# Patient Record
Sex: Female | Born: 1935
Health system: Southern US, Community
[De-identification: ages and names within clinical notes are randomized; demographics above are authoritative.]

## PROBLEM LIST (undated history)

## (undated) DIAGNOSIS — R569 Unspecified convulsions: Secondary | ICD-10-CM

## (undated) DIAGNOSIS — I779 Disorder of arteries and arterioles, unspecified: Secondary | ICD-10-CM

## (undated) DIAGNOSIS — Z951 Presence of aortocoronary bypass graft: Secondary | ICD-10-CM

## (undated) DIAGNOSIS — I739 Peripheral vascular disease, unspecified: Secondary | ICD-10-CM

## (undated) DIAGNOSIS — M1990 Unspecified osteoarthritis, unspecified site: Secondary | ICD-10-CM

## (undated) DIAGNOSIS — D219 Benign neoplasm of connective and other soft tissue, unspecified: Secondary | ICD-10-CM

## (undated) DIAGNOSIS — I10 Essential (primary) hypertension: Secondary | ICD-10-CM

## (undated) DIAGNOSIS — N951 Menopausal and female climacteric states: Secondary | ICD-10-CM

## (undated) DIAGNOSIS — E785 Hyperlipidemia, unspecified: Secondary | ICD-10-CM

## (undated) DIAGNOSIS — G629 Polyneuropathy, unspecified: Secondary | ICD-10-CM

## (undated) DIAGNOSIS — D649 Anemia, unspecified: Secondary | ICD-10-CM

## (undated) DIAGNOSIS — I2510 Atherosclerotic heart disease of native coronary artery without angina pectoris: Secondary | ICD-10-CM

## (undated) DIAGNOSIS — J45909 Unspecified asthma, uncomplicated: Secondary | ICD-10-CM

## (undated) HISTORY — PX: FOOT SURGERY: SHX648

## (undated) HISTORY — DX: Peripheral vascular disease, unspecified: I73.9

## (undated) HISTORY — PX: OTHER SURGICAL HISTORY: SHX169

## (undated) HISTORY — DX: Presence of aortocoronary bypass graft: Z95.1

## (undated) HISTORY — DX: Polyneuropathy, unspecified: G62.9

## (undated) HISTORY — DX: Benign neoplasm of connective and other soft tissue, unspecified: D21.9

## (undated) HISTORY — DX: Essential (primary) hypertension: I10

## (undated) HISTORY — DX: Atherosclerotic heart disease of native coronary artery without angina pectoris: I25.10

## (undated) HISTORY — DX: Menopausal and female climacteric states: N95.1

## (undated) HISTORY — PX: CORONARY ARTERY BYPASS GRAFT: SHX141

## (undated) HISTORY — DX: Hyperlipidemia, unspecified: E78.5

## (undated) HISTORY — DX: Unspecified convulsions: R56.9

## (undated) HISTORY — DX: Disorder of arteries and arterioles, unspecified: I77.9

## (undated) HISTORY — PX: JOINT REPLACEMENT: SHX530

---

## 1997-12-14 HISTORY — PX: OTHER SURGICAL HISTORY: SHX169

## 1997-12-24 ENCOUNTER — Ambulatory Visit (HOSPITAL_COMMUNITY): Admission: RE | Admit: 1997-12-24 | Discharge: 1997-12-24 | Payer: Self-pay | Admitting: Obstetrics and Gynecology

## 1998-03-22 ENCOUNTER — Other Ambulatory Visit: Admission: RE | Admit: 1998-03-22 | Discharge: 1998-03-22 | Payer: Self-pay | Admitting: Obstetrics and Gynecology

## 1999-05-30 ENCOUNTER — Other Ambulatory Visit: Admission: RE | Admit: 1999-05-30 | Discharge: 1999-05-30 | Payer: Self-pay | Admitting: Obstetrics and Gynecology

## 1999-05-30 ENCOUNTER — Encounter (INDEPENDENT_AMBULATORY_CARE_PROVIDER_SITE_OTHER): Payer: Self-pay | Admitting: Specialist

## 2000-06-13 ENCOUNTER — Other Ambulatory Visit: Admission: RE | Admit: 2000-06-13 | Discharge: 2000-06-13 | Payer: Self-pay | Admitting: Obstetrics and Gynecology

## 2001-04-05 ENCOUNTER — Encounter: Payer: Self-pay | Admitting: Internal Medicine

## 2001-04-05 ENCOUNTER — Encounter: Admission: RE | Admit: 2001-04-05 | Discharge: 2001-04-05 | Payer: Self-pay | Admitting: Internal Medicine

## 2001-07-23 ENCOUNTER — Other Ambulatory Visit: Admission: RE | Admit: 2001-07-23 | Discharge: 2001-07-23 | Payer: Self-pay | Admitting: Obstetrics and Gynecology

## 2002-06-18 ENCOUNTER — Ambulatory Visit: Admission: RE | Admit: 2002-06-18 | Discharge: 2002-06-18 | Payer: Self-pay | Admitting: Internal Medicine

## 2002-09-17 ENCOUNTER — Other Ambulatory Visit: Admission: RE | Admit: 2002-09-17 | Discharge: 2002-09-17 | Payer: Self-pay | Admitting: Obstetrics and Gynecology

## 2004-12-05 ENCOUNTER — Other Ambulatory Visit: Admission: RE | Admit: 2004-12-05 | Discharge: 2004-12-05 | Payer: Self-pay | Admitting: Obstetrics and Gynecology

## 2005-07-16 DIAGNOSIS — Z951 Presence of aortocoronary bypass graft: Secondary | ICD-10-CM

## 2005-07-16 HISTORY — DX: Presence of aortocoronary bypass graft: Z95.1

## 2005-12-06 ENCOUNTER — Other Ambulatory Visit: Admission: RE | Admit: 2005-12-06 | Discharge: 2005-12-06 | Payer: Self-pay | Admitting: Obstetrics and Gynecology

## 2006-05-16 HISTORY — PX: OTHER SURGICAL HISTORY: SHX169

## 2006-07-04 ENCOUNTER — Encounter (HOSPITAL_COMMUNITY): Admission: RE | Admit: 2006-07-04 | Discharge: 2006-10-02 | Payer: Self-pay | Admitting: Cardiology

## 2006-10-03 ENCOUNTER — Encounter (HOSPITAL_COMMUNITY): Admission: RE | Admit: 2006-10-03 | Discharge: 2006-10-19 | Payer: Self-pay | Admitting: Cardiovascular Disease

## 2006-10-29 ENCOUNTER — Encounter (HOSPITAL_COMMUNITY): Admission: RE | Admit: 2006-10-29 | Discharge: 2007-01-27 | Payer: Self-pay | Admitting: Cardiovascular Disease

## 2006-12-23 ENCOUNTER — Other Ambulatory Visit: Admission: RE | Admit: 2006-12-23 | Discharge: 2006-12-23 | Payer: Self-pay | Admitting: Obstetrics and Gynecology

## 2007-02-14 ENCOUNTER — Encounter (HOSPITAL_COMMUNITY): Admission: RE | Admit: 2007-02-14 | Discharge: 2007-05-15 | Payer: Self-pay | Admitting: Cardiovascular Disease

## 2007-05-17 ENCOUNTER — Encounter (HOSPITAL_COMMUNITY): Admission: RE | Admit: 2007-05-17 | Discharge: 2007-07-15 | Payer: Self-pay | Admitting: Cardiovascular Disease

## 2007-07-17 ENCOUNTER — Encounter (HOSPITAL_COMMUNITY): Admission: RE | Admit: 2007-07-17 | Discharge: 2007-10-15 | Payer: Self-pay | Admitting: Cardiovascular Disease

## 2007-10-16 ENCOUNTER — Encounter (HOSPITAL_COMMUNITY): Admission: RE | Admit: 2007-10-16 | Discharge: 2008-01-14 | Payer: Self-pay | Admitting: Cardiovascular Disease

## 2007-10-30 HISTORY — PX: US ECHOCARDIOGRAPHY: HXRAD669

## 2007-12-25 ENCOUNTER — Other Ambulatory Visit: Admission: RE | Admit: 2007-12-25 | Discharge: 2007-12-25 | Payer: Self-pay | Admitting: Obstetrics and Gynecology

## 2008-01-09 HISTORY — PX: CARDIAC CATHETERIZATION: SHX172

## 2008-01-15 ENCOUNTER — Encounter (HOSPITAL_COMMUNITY): Admission: RE | Admit: 2008-01-15 | Discharge: 2008-04-14 | Payer: Self-pay | Admitting: Cardiovascular Disease

## 2008-04-15 ENCOUNTER — Encounter (HOSPITAL_COMMUNITY): Admission: RE | Admit: 2008-04-15 | Discharge: 2008-07-12 | Payer: Self-pay | Admitting: Cardiovascular Disease

## 2008-07-16 ENCOUNTER — Encounter (HOSPITAL_COMMUNITY): Admission: RE | Admit: 2008-07-16 | Discharge: 2008-10-14 | Payer: Self-pay | Admitting: Cardiovascular Disease

## 2008-10-15 ENCOUNTER — Encounter (HOSPITAL_COMMUNITY): Admission: RE | Admit: 2008-10-15 | Discharge: 2009-01-13 | Payer: Self-pay | Admitting: Cardiovascular Disease

## 2009-01-10 ENCOUNTER — Encounter: Payer: Self-pay | Admitting: Obstetrics and Gynecology

## 2009-01-10 ENCOUNTER — Ambulatory Visit: Payer: Self-pay | Admitting: Obstetrics and Gynecology

## 2009-01-10 ENCOUNTER — Other Ambulatory Visit: Admission: RE | Admit: 2009-01-10 | Discharge: 2009-01-10 | Payer: Self-pay | Admitting: Obstetrics and Gynecology

## 2009-11-29 ENCOUNTER — Encounter: Admission: RE | Admit: 2009-11-29 | Discharge: 2009-11-29 | Payer: Self-pay | Admitting: Internal Medicine

## 2010-02-09 ENCOUNTER — Ambulatory Visit: Payer: Self-pay | Admitting: Obstetrics and Gynecology

## 2010-07-11 ENCOUNTER — Encounter: Payer: Self-pay | Admitting: Cardiovascular Disease

## 2010-07-11 ENCOUNTER — Ambulatory Visit: Payer: Self-pay

## 2010-07-11 DIAGNOSIS — I739 Peripheral vascular disease, unspecified: Secondary | ICD-10-CM | POA: Insufficient documentation

## 2010-08-17 NOTE — Miscellaneous (Signed)
Summary: Orders Update  Clinical Lists Changes  Problems: Added new problem of UNSPECIFIED PERIPHERAL VASCULAR DISEASE (ICD-443.9) Orders: Added new Test order of Arterial Duplex Lower Extremity (Arterial Duplex Low) - Signed

## 2011-01-12 HISTORY — PX: NM MYOVIEW LTD: HXRAD82

## 2011-01-18 ENCOUNTER — Encounter: Payer: Self-pay | Admitting: *Deleted

## 2011-02-12 ENCOUNTER — Encounter: Payer: Self-pay | Admitting: Obstetrics and Gynecology

## 2011-02-12 ENCOUNTER — Ambulatory Visit (INDEPENDENT_AMBULATORY_CARE_PROVIDER_SITE_OTHER): Payer: Medicare Other | Admitting: Obstetrics and Gynecology

## 2011-02-12 ENCOUNTER — Other Ambulatory Visit (HOSPITAL_COMMUNITY)
Admission: RE | Admit: 2011-02-12 | Discharge: 2011-02-12 | Disposition: A | Discharge status: Home / Self Care | Payer: Medicare Other | Source: Ambulatory Visit | Attending: Obstetrics and Gynecology | Admitting: Obstetrics and Gynecology

## 2011-02-12 VITALS — BP 130/80 | Ht 67.0 in | Wt 198.0 lb

## 2011-02-12 DIAGNOSIS — Z01419 Encounter for gynecological examination (general) (routine) without abnormal findings: Secondary | ICD-10-CM

## 2011-02-12 DIAGNOSIS — Z124 Encounter for screening for malignant neoplasm of cervix: Secondary | ICD-10-CM | POA: Insufficient documentation

## 2011-02-12 DIAGNOSIS — N951 Menopausal and female climacteric states: Secondary | ICD-10-CM

## 2011-02-12 DIAGNOSIS — R350 Frequency of micturition: Secondary | ICD-10-CM | POA: Insufficient documentation

## 2011-02-12 DIAGNOSIS — Z780 Asymptomatic menopausal state: Secondary | ICD-10-CM

## 2011-02-12 DIAGNOSIS — N952 Postmenopausal atrophic vaginitis: Secondary | ICD-10-CM

## 2011-02-12 DIAGNOSIS — D259 Leiomyoma of uterus, unspecified: Secondary | ICD-10-CM | POA: Insufficient documentation

## 2011-02-12 NOTE — Progress Notes (Signed)
The patient came back to see me today for further followup. She remains on CombiPatch but she is only using one a week rather than two aweek like she used to. This is controlling all her menopausal symptoms. Although she has vaginal dryness on exam she is not sexually active and is not aware of that. She does have nocturia but does not feel it needs treatment. She has no dysuria hematuria or urgency. She has fibroids that we have watched for many years and it continued to be a nonissue for her. She has no bleeding anymore( vaginal bleeding). She is due for a mammogram next month. She is up-to-date on bone densities.  Past medical history, family history, social history, and review of systems was completely reviewed and is unchanged. Please see above notes.  Physical examination: HEENT within normal limits. Neck: Thyroid not large. No masses. Supraclavicular nodes: not enlarged. Breasts: Examined in both sitting midline position. No skin changes and no masses. Abdomen: Soft no guarding rebound or masses or hernia. Pelvic: External: Within normal limits. BUS: Within normal limits. Vaginal:within normal limits. Good estrogen effect. No evidence of cystocele rectocele or enterocele. Cervix: clean. Uterus: top normal size and shape. Adnexa: No masses. Rectovaginal exam: Confirmatory and negative. Extremities: Within normal limits.  Assessment: 1 menopausal symptoms. 2 fibroids. 3 atrophic vaginitis. 4 nocturia.  Plan: Continue CombiPatch 0.05 mg once a week. We discussed a higher risk of breast cancer with estrogen. Mammogram next month.

## 2011-03-01 ENCOUNTER — Encounter: Payer: Self-pay | Admitting: Obstetrics and Gynecology

## 2011-03-12 ENCOUNTER — Other Ambulatory Visit: Payer: Self-pay

## 2011-03-12 ENCOUNTER — Encounter: Payer: Self-pay | Admitting: Obstetrics and Gynecology

## 2011-03-12 DIAGNOSIS — R922 Inconclusive mammogram: Secondary | ICD-10-CM

## 2011-04-30 ENCOUNTER — Other Ambulatory Visit: Payer: Self-pay | Admitting: Obstetrics and Gynecology

## 2011-05-21 ENCOUNTER — Other Ambulatory Visit: Payer: Self-pay | Admitting: Ophthalmology

## 2011-07-19 DIAGNOSIS — I10 Essential (primary) hypertension: Secondary | ICD-10-CM | POA: Diagnosis not present

## 2011-07-24 DIAGNOSIS — Z79899 Other long term (current) drug therapy: Secondary | ICD-10-CM | POA: Diagnosis not present

## 2011-07-24 DIAGNOSIS — E7800 Pure hypercholesterolemia, unspecified: Secondary | ICD-10-CM | POA: Diagnosis not present

## 2011-07-26 DIAGNOSIS — E7800 Pure hypercholesterolemia, unspecified: Secondary | ICD-10-CM | POA: Diagnosis not present

## 2011-08-23 DIAGNOSIS — I10 Essential (primary) hypertension: Secondary | ICD-10-CM | POA: Diagnosis not present

## 2011-08-27 DIAGNOSIS — I6529 Occlusion and stenosis of unspecified carotid artery: Secondary | ICD-10-CM | POA: Diagnosis not present

## 2011-08-27 HISTORY — PX: OTHER SURGICAL HISTORY: SHX169

## 2011-09-07 DIAGNOSIS — E7800 Pure hypercholesterolemia, unspecified: Secondary | ICD-10-CM | POA: Diagnosis not present

## 2011-09-18 DIAGNOSIS — I10 Essential (primary) hypertension: Secondary | ICD-10-CM | POA: Diagnosis not present

## 2011-09-18 DIAGNOSIS — E789 Disorder of lipoprotein metabolism, unspecified: Secondary | ICD-10-CM | POA: Diagnosis not present

## 2011-09-18 DIAGNOSIS — I2510 Atherosclerotic heart disease of native coronary artery without angina pectoris: Secondary | ICD-10-CM | POA: Diagnosis not present

## 2011-10-15 DIAGNOSIS — E789 Disorder of lipoprotein metabolism, unspecified: Secondary | ICD-10-CM | POA: Diagnosis not present

## 2011-10-22 DIAGNOSIS — E789 Disorder of lipoprotein metabolism, unspecified: Secondary | ICD-10-CM | POA: Diagnosis not present

## 2011-12-27 DIAGNOSIS — E782 Mixed hyperlipidemia: Secondary | ICD-10-CM | POA: Diagnosis not present

## 2011-12-27 DIAGNOSIS — I6529 Occlusion and stenosis of unspecified carotid artery: Secondary | ICD-10-CM | POA: Diagnosis not present

## 2011-12-27 DIAGNOSIS — I10 Essential (primary) hypertension: Secondary | ICD-10-CM | POA: Diagnosis not present

## 2011-12-27 DIAGNOSIS — I2510 Atherosclerotic heart disease of native coronary artery without angina pectoris: Secondary | ICD-10-CM | POA: Diagnosis not present

## 2012-01-16 DIAGNOSIS — E789 Disorder of lipoprotein metabolism, unspecified: Secondary | ICD-10-CM | POA: Diagnosis not present

## 2012-01-21 DIAGNOSIS — E789 Disorder of lipoprotein metabolism, unspecified: Secondary | ICD-10-CM | POA: Diagnosis not present

## 2012-01-21 DIAGNOSIS — I2510 Atherosclerotic heart disease of native coronary artery without angina pectoris: Secondary | ICD-10-CM | POA: Diagnosis not present

## 2012-01-21 DIAGNOSIS — I10 Essential (primary) hypertension: Secondary | ICD-10-CM | POA: Diagnosis not present

## 2012-01-23 DIAGNOSIS — B351 Tinea unguium: Secondary | ICD-10-CM | POA: Diagnosis not present

## 2012-01-23 DIAGNOSIS — M79609 Pain in unspecified limb: Secondary | ICD-10-CM | POA: Diagnosis not present

## 2012-02-15 ENCOUNTER — Ambulatory Visit (INDEPENDENT_AMBULATORY_CARE_PROVIDER_SITE_OTHER): Payer: Medicare Other | Admitting: Obstetrics and Gynecology

## 2012-02-15 ENCOUNTER — Encounter: Payer: Self-pay | Admitting: Obstetrics and Gynecology

## 2012-02-15 VITALS — BP 120/76 | Ht 67.0 in | Wt 202.0 lb

## 2012-02-15 DIAGNOSIS — R351 Nocturia: Secondary | ICD-10-CM | POA: Diagnosis not present

## 2012-02-15 DIAGNOSIS — E785 Hyperlipidemia, unspecified: Secondary | ICD-10-CM | POA: Insufficient documentation

## 2012-02-15 DIAGNOSIS — D219 Benign neoplasm of connective and other soft tissue, unspecified: Secondary | ICD-10-CM

## 2012-02-15 DIAGNOSIS — E7800 Pure hypercholesterolemia, unspecified: Secondary | ICD-10-CM | POA: Insufficient documentation

## 2012-02-15 DIAGNOSIS — Z780 Asymptomatic menopausal state: Secondary | ICD-10-CM | POA: Diagnosis not present

## 2012-02-15 DIAGNOSIS — D259 Leiomyoma of uterus, unspecified: Secondary | ICD-10-CM | POA: Diagnosis not present

## 2012-02-15 MED ORDER — ESTRADIOL-NORETHINDRONE ACET 0.05-0.14 MG/DAY TD PTTW: 1.0000 | MEDICATED_PATCH | TRANSDERMAL | Status: DC

## 2012-02-15 NOTE — Progress Notes (Signed)
Patient came to see me today for further followup. She takes CombiPatch for menopausal symptoms with excellent results. Initially she would do it twice a week as it is usually prescribed and she only needs it once a week with no problems. She is having no vaginal bleeding. She is having no pelvic pain. She is not sexually active. She has fibroids and we have watched them for 32 years and she is not symptomatic enough to require surgery. She is due for her yearly mammogram. She has bone densities through  her PCP. She gets skin exams because of multiple moles from her dermatologist. She has nocturia x3 without urgency or incontinence. She attributes it to her medication which she takes at bedtime. She's never had an abnormal Pap smear. Her last Pap smear was last year.  ROS: 12 systems reviewed done. Pertinent positives above. Other positives include peripheral vascular disease, and hyperlipidemia.  Physical examination:Kim Alcario Drought present. HEENT within normal limits. Neck: Thyroid not large. No masses. Supraclavicular nodes: not enlarged. Breasts: Examined in both sitting and lying  position. No skin changes and no masses. Abdomen: Soft no guarding rebound or masses or hernia. Pelvic: External: Within normal limits. BUS: Within normal limits. Vaginal:within normal limits. Good estrogen effect. No evidence of cystocele rectocele or enterocele. Cervix: clean. Uterus:enlarged with small fibroids to 7-8 week size. Adnexa: No masses. Rectovaginal exam: Confirmatory and negative.  Assessment: #1. Menopausal symptoms #2. Fibroids #3. Nocturia  Plan: Discussed increased risk of breast cancer with patch. Patient will continue CombiPatch one weekly. Mammogram. Patient to go back and get skin exam. She will discuss nocturia with PCP relative to her medication. She knows to call me if she would like me to treat.The new Pap smear guidelines were discussed with the patient. No pap done. Extremities: Within normal  limits.

## 2012-02-15 NOTE — Patient Instructions (Addendum)
Please schedule mammogram

## 2012-02-16 LAB — URINALYSIS W MICROSCOPIC + REFLEX CULTURE
Bacteria, UA: NONE SEEN
Leukocytes, UA: NEGATIVE
Nitrite: NEGATIVE
Specific Gravity, Urine: 1.027 (ref 1.005–1.030)
Urobilinogen, UA: 0.2 mg/dL (ref 0.0–1.0)
pH: 5 (ref 5.0–8.0)

## 2012-02-18 DIAGNOSIS — E789 Disorder of lipoprotein metabolism, unspecified: Secondary | ICD-10-CM | POA: Diagnosis not present

## 2012-02-18 DIAGNOSIS — Z0000 Encounter for general adult medical examination without abnormal findings: Secondary | ICD-10-CM | POA: Diagnosis not present

## 2012-02-18 DIAGNOSIS — E559 Vitamin D deficiency, unspecified: Secondary | ICD-10-CM | POA: Diagnosis not present

## 2012-02-21 ENCOUNTER — Other Ambulatory Visit: Payer: Self-pay | Admitting: Obstetrics and Gynecology

## 2012-02-21 DIAGNOSIS — R252 Cramp and spasm: Secondary | ICD-10-CM | POA: Diagnosis not present

## 2012-02-21 DIAGNOSIS — I2510 Atherosclerotic heart disease of native coronary artery without angina pectoris: Secondary | ICD-10-CM | POA: Diagnosis not present

## 2012-02-21 DIAGNOSIS — Z8614 Personal history of Methicillin resistant Staphylococcus aureus infection: Secondary | ICD-10-CM | POA: Diagnosis not present

## 2012-02-21 DIAGNOSIS — N390 Urinary tract infection, site not specified: Secondary | ICD-10-CM

## 2012-02-21 DIAGNOSIS — E7800 Pure hypercholesterolemia, unspecified: Secondary | ICD-10-CM | POA: Diagnosis not present

## 2012-02-21 MED ORDER — CIPROFLOXACIN HCL 250 MG PO TABS: 250.0000 mg | ORAL_TABLET | Freq: Two times a day (BID) | ORAL | Status: AC

## 2012-03-14 DIAGNOSIS — I10 Essential (primary) hypertension: Secondary | ICD-10-CM | POA: Diagnosis not present

## 2012-03-14 DIAGNOSIS — E789 Disorder of lipoprotein metabolism, unspecified: Secondary | ICD-10-CM | POA: Diagnosis not present

## 2012-04-03 DIAGNOSIS — E669 Obesity, unspecified: Secondary | ICD-10-CM | POA: Diagnosis not present

## 2012-04-03 DIAGNOSIS — R252 Cramp and spasm: Secondary | ICD-10-CM | POA: Diagnosis not present

## 2012-05-01 DIAGNOSIS — M545 Low back pain: Secondary | ICD-10-CM | POA: Diagnosis not present

## 2012-05-09 DIAGNOSIS — M545 Low back pain: Secondary | ICD-10-CM | POA: Diagnosis not present

## 2012-05-12 DIAGNOSIS — M545 Low back pain: Secondary | ICD-10-CM | POA: Diagnosis not present

## 2012-05-14 DIAGNOSIS — M545 Low back pain: Secondary | ICD-10-CM | POA: Diagnosis not present

## 2012-05-19 DIAGNOSIS — M545 Low back pain: Secondary | ICD-10-CM | POA: Diagnosis not present

## 2012-05-21 DIAGNOSIS — M545 Low back pain: Secondary | ICD-10-CM | POA: Diagnosis not present

## 2012-05-26 DIAGNOSIS — M545 Low back pain: Secondary | ICD-10-CM | POA: Diagnosis not present

## 2012-05-27 DIAGNOSIS — H52209 Unspecified astigmatism, unspecified eye: Secondary | ICD-10-CM | POA: Diagnosis not present

## 2012-05-27 DIAGNOSIS — H35379 Puckering of macula, unspecified eye: Secondary | ICD-10-CM | POA: Diagnosis not present

## 2012-05-27 DIAGNOSIS — Z961 Presence of intraocular lens: Secondary | ICD-10-CM | POA: Diagnosis not present

## 2012-05-28 DIAGNOSIS — M545 Low back pain: Secondary | ICD-10-CM | POA: Diagnosis not present

## 2012-06-02 DIAGNOSIS — M545 Low back pain: Secondary | ICD-10-CM | POA: Diagnosis not present

## 2012-06-04 DIAGNOSIS — M545 Low back pain: Secondary | ICD-10-CM | POA: Diagnosis not present

## 2012-06-16 DIAGNOSIS — M545 Low back pain: Secondary | ICD-10-CM | POA: Diagnosis not present

## 2012-06-18 DIAGNOSIS — M545 Low back pain: Secondary | ICD-10-CM | POA: Diagnosis not present

## 2012-06-30 DIAGNOSIS — M545 Low back pain: Secondary | ICD-10-CM | POA: Diagnosis not present

## 2012-07-10 DIAGNOSIS — I10 Essential (primary) hypertension: Secondary | ICD-10-CM | POA: Diagnosis not present

## 2012-07-10 DIAGNOSIS — G4762 Sleep related leg cramps: Secondary | ICD-10-CM | POA: Diagnosis not present

## 2012-07-11 DIAGNOSIS — I10 Essential (primary) hypertension: Secondary | ICD-10-CM | POA: Diagnosis not present

## 2012-07-11 DIAGNOSIS — M545 Low back pain: Secondary | ICD-10-CM | POA: Diagnosis not present

## 2012-07-11 DIAGNOSIS — E782 Mixed hyperlipidemia: Secondary | ICD-10-CM | POA: Diagnosis not present

## 2012-07-11 DIAGNOSIS — I2510 Atherosclerotic heart disease of native coronary artery without angina pectoris: Secondary | ICD-10-CM | POA: Diagnosis not present

## 2012-07-18 DIAGNOSIS — I10 Essential (primary) hypertension: Secondary | ICD-10-CM | POA: Diagnosis not present

## 2012-07-18 DIAGNOSIS — E789 Disorder of lipoprotein metabolism, unspecified: Secondary | ICD-10-CM | POA: Diagnosis not present

## 2012-07-18 DIAGNOSIS — M545 Low back pain: Secondary | ICD-10-CM | POA: Diagnosis not present

## 2012-07-22 DIAGNOSIS — R252 Cramp and spasm: Secondary | ICD-10-CM | POA: Diagnosis not present

## 2012-07-22 DIAGNOSIS — E7800 Pure hypercholesterolemia, unspecified: Secondary | ICD-10-CM | POA: Diagnosis not present

## 2012-12-01 ENCOUNTER — Other Ambulatory Visit: Payer: Self-pay | Admitting: Cardiovascular Disease

## 2012-12-02 NOTE — Telephone Encounter (Signed)
Rx was sent to pharmacy electronically. 

## 2013-01-03 ENCOUNTER — Encounter: Payer: Self-pay | Admitting: *Deleted

## 2013-01-05 ENCOUNTER — Encounter: Payer: Self-pay | Admitting: Cardiovascular Disease

## 2013-01-09 ENCOUNTER — Ambulatory Visit: Payer: Medicare Other | Admitting: Physician Assistant

## 2013-01-13 ENCOUNTER — Ambulatory Visit (INDEPENDENT_AMBULATORY_CARE_PROVIDER_SITE_OTHER): Payer: Medicare Other | Admitting: Physician Assistant

## 2013-01-13 ENCOUNTER — Encounter: Payer: Self-pay | Admitting: Physician Assistant

## 2013-01-13 VITALS — BP 144/74 | HR 75 | Ht 66.5 in | Wt 206.2 lb

## 2013-01-13 DIAGNOSIS — I2510 Atherosclerotic heart disease of native coronary artery without angina pectoris: Secondary | ICD-10-CM | POA: Insufficient documentation

## 2013-01-13 DIAGNOSIS — I10 Essential (primary) hypertension: Secondary | ICD-10-CM | POA: Insufficient documentation

## 2013-01-13 DIAGNOSIS — E7800 Pure hypercholesterolemia, unspecified: Secondary | ICD-10-CM

## 2013-01-13 DIAGNOSIS — I739 Peripheral vascular disease, unspecified: Secondary | ICD-10-CM | POA: Diagnosis not present

## 2013-01-13 DIAGNOSIS — Z951 Presence of aortocoronary bypass graft: Secondary | ICD-10-CM | POA: Diagnosis not present

## 2013-01-13 NOTE — Assessment & Plan Note (Signed)
Well compensated with no complaints of angina.

## 2013-01-13 NOTE — Assessment & Plan Note (Addendum)
Most recent carotid Dopplers were February 2013 demonstrated moderate amount of fibrous plaque in the right internal carotid artery consistent with equal to or less than 50% diameter reduction. The left ICA demonstrated moderate amount of plaque with no evidence of significant diameter reduction.  Patient does have a decrease of 18 mmHg in blood pressure in the right arm compared to the left.  We'll arrange upper extremity arterial Dopplers.

## 2013-01-13 NOTE — Assessment & Plan Note (Signed)
Blood pressure is elevated today however, patient has not taken her morning medications she just traveled from [place] after attending a funeral.

## 2013-01-13 NOTE — Progress Notes (Signed)
Date:  01/13/2013   ID:  Tara Potts, DOB 1934-01-21, MRN [id number]  PCP:  Horatio Pel, MD  Primary Cardiologist:  Gwenlyn Found    History of Present Illness: Tara Potts is a 79 y.o. female the history of coronary artery disease and coronary artery bypass grafting November 2007 at [facility]. She also has peripheral arterial disease, hypertension, hyperlipidemia. Last carotid Dopplers were February 2013 showed moderate right and left ICA stenosis. Last Myoview stress test was 01/12/2011 was nonischemic. Her last cardiac catheterization was 01/09/2008 revealed diffusely diseased graft to the PDA as well as diffuse PDA disease. She's been treated medically.  Patient presents today for her six-month followup. She does complain of some intermittent paresthesia in the right hand. She thought was positional but somehow she was sleeping. Also continues to complain of significant intermittent cramping feelings in her feet and lower legs without ambulation. Other than that she feels quite well and denies nausea, vomiting, fever, chest pain, shortness of breath, orthopnea, dizziness, PND, cough, congestion, abdominal pain, hematochezia, melena, lower extremity edema, claudication.  Wt Readings from Last 3 Encounters:  01/13/13 206 lb 3.2 oz (93.532 kg)  02/15/12 202 lb (91.627 kg)  02/12/11 198 lb (89.812 kg)     Past Medical History  Diagnosis Date  . Fibroid   . Menopausal symptoms   . CAD (coronary artery disease)   . Hyperlipemia   . Hypertension   . Carotid disease, bilateral     mild  . Hx of CABG 2007    [facility]    Current Outpatient Prescriptions  Medication Sig Dispense Refill  . Ascorbic Acid (VITAMIN C PO) Take by mouth.        [name] aspirin 325 MG tablet Take 325 mg by mouth daily.        [name] CALCIUM PO Take by mouth.        . co-enzyme Q-10 30 MG capsule Take 30 mg by mouth 3 (three) times daily.      . cyclobenzaprine (FLEXERIL) 10 MG tablet Take 10 mg by  mouth at bedtime as needed.      [name] estradiol-norethindrone (COMBIPATCH) 0.05-0.14 MG/DAY Place 1 patch onto the skin 2 (two) times a week.  24 patch  3  . fish oil-omega-3 fatty acids 1000 MG capsule Take 2 g by mouth daily.        [name] glucosamine-chondroitin 500-400 MG tablet Take 2 tablets by mouth 2 (two) times daily.      [name] losartan-hydrochlorothiazide (HYZAAR) 100-25 MG per tablet Take 1 tablet by mouth daily. Take 1/2 tablet daily      . metoprolol (LOPRESSOR) 50 MG tablet TAKE 1 TABLET BY MOUTH TWICE DAILY  60 tablet  6  . Multiple Vitamin (MULTIVITAMIN) tablet Take 1 tablet by mouth daily.        . nitroGLYCERIN (NITROLINGUAL) 0.4 MG/SPRAY spray Place 1 spray under the tongue every 5 (five) minutes as needed for chest pain.      . rosuvastatin (CRESTOR) 40 MG tablet Take 20 mg by mouth daily.        No current facility-administered medications for this visit.    Allergies:   No Known Allergies  Social History:  The patient  reports that she has quit smoking. She has never used smokeless tobacco. She reports that she does not drink alcohol or use illicit drugs.   Family history:   Family History  Problem Relation Age of Onset  . Diabetes Mother   .  Hypertension Mother   . Heart disease Mother   . Heart failure Mother   . Hypertension Sister   . Breast cancer Maternal Aunt     Age 29  . Heart failure Maternal Aunt   . Heart failure Maternal Uncle     ROS:  Please see the history of present illness.  All other systems reviewed and negative.   PHYSICAL EXAM: VS:  BP 144/74  Pulse 75  Ht 5' 6.5" (1.689 m)  Wt 206 lb 3.2 oz (93.532 kg)  BMI 32.79 kg/m2  LMP 07/16/2002     Blood pressure right arm 142/76, blood pressure left arm 160/78 Well nourished, well developed, in no acute distress HEENT: Pupils are equal round react to light accommodation extraocular movements are intact.  Neck: no JVDNo cervical lymphadenopathy. Cardiac: Regular rate and rhythm without murmurs rubs  or gallops. Lungs:  clear to auscultation bilaterally, no wheezing, rhonchi or rales Abd: soft, nontender, positive bowel sounds all quadrants, no hepatosplenomegaly Ext: no lower extremity edema.  2+ radial and dorsalis pedis pulses. Skin: warm and dry Neuro:  Grossly normal  EKG:  Biphasic P wave in V1 and 2 indicating interval enlargement. Otherwise sinus rhythm with premature supraventricular complexes. Rate 74 beats per minute  ASSESSMENT AND PLAN:  Problem List Items Addressed This Visit   Status post coronary artery bypass grafting: 2007   Peripheral vascular disease:  Moderate right and mild left ICA stenosis.     Most recent carotid Dopplers were February 2013 demonstrated moderate amount of fibrous plaque in the right internal carotid artery consistent with equal to or less than 50% diameter reduction. The left ICA demonstrated moderate amount of plaque with no evidence of significant diameter reduction.  Patient does have a decrease of 18 mmHg in blood pressure in the right arm compared to the left.  We'll arrange upper extremity arterial Dopplers.    Relevant Orders      Upper Extremity Arterial Duplex   Essential hypertension     Blood pressure is elevated today however, patient has not taken her morning medications she just traveled from [place] after attending a funeral.    Elevated cholesterol   Coronary artery disease     Well compensated with no complaints of angina.     Other Visit Diagnoses   CAD (coronary artery disease)    -  Primary    Relevant Orders       EKG 12-Lead

## 2013-01-13 NOTE — Patient Instructions (Signed)
He'll be scheduled for upper extremity arterial ultrasound then followup with Dr. Alvester Chou in 6 months or sooner depending on results of the study.

## 2013-01-20 DIAGNOSIS — E789 Disorder of lipoprotein metabolism, unspecified: Secondary | ICD-10-CM | POA: Diagnosis not present

## 2013-01-22 ENCOUNTER — Ambulatory Visit (HOSPITAL_COMMUNITY)
Admission: RE | Admit: 2013-01-22 | Discharge: 2013-01-22 | Disposition: A | Discharge status: Home / Self Care | Payer: Medicare Other | Source: Ambulatory Visit | Attending: Cardiovascular Disease | Admitting: Cardiovascular Disease

## 2013-01-22 DIAGNOSIS — E7800 Pure hypercholesterolemia, unspecified: Secondary | ICD-10-CM | POA: Diagnosis not present

## 2013-01-22 DIAGNOSIS — I739 Peripheral vascular disease, unspecified: Secondary | ICD-10-CM | POA: Diagnosis not present

## 2013-01-22 DIAGNOSIS — I658 Occlusion and stenosis of other precerebral arteries: Secondary | ICD-10-CM | POA: Insufficient documentation

## 2013-01-22 DIAGNOSIS — E789 Disorder of lipoprotein metabolism, unspecified: Secondary | ICD-10-CM | POA: Diagnosis not present

## 2013-01-22 DIAGNOSIS — I6529 Occlusion and stenosis of unspecified carotid artery: Secondary | ICD-10-CM | POA: Diagnosis not present

## 2013-01-22 DIAGNOSIS — Z006 Encounter for examination for normal comparison and control in clinical research program: Secondary | ICD-10-CM | POA: Diagnosis not present

## 2013-01-22 DIAGNOSIS — I10 Essential (primary) hypertension: Secondary | ICD-10-CM | POA: Diagnosis not present

## 2013-01-22 NOTE — Progress Notes (Signed)
Upper Ext. Arterial Duplex Completed.  No evidence of hemodynamically significant stenosis. Bilateral BP's  Were essentially the same. Oda Cogan, RDMS, RVT

## 2013-01-28 ENCOUNTER — Encounter: Payer: Self-pay | Admitting: *Deleted

## 2013-02-19 DIAGNOSIS — Z1231 Encounter for screening mammogram for malignant neoplasm of breast: Secondary | ICD-10-CM | POA: Diagnosis not present

## 2013-02-23 ENCOUNTER — Encounter: Payer: Self-pay | Admitting: Women's Health

## 2013-02-24 ENCOUNTER — Other Ambulatory Visit: Payer: Self-pay | Admitting: Obstetrics and Gynecology

## 2013-02-24 NOTE — Telephone Encounter (Signed)
Yearly exam scheduled for August with Dr. Loetta Rough.

## 2013-02-26 DIAGNOSIS — Z0000 Encounter for general adult medical examination without abnormal findings: Secondary | ICD-10-CM | POA: Diagnosis not present

## 2013-02-26 DIAGNOSIS — I10 Essential (primary) hypertension: Secondary | ICD-10-CM | POA: Diagnosis not present

## 2013-02-26 DIAGNOSIS — N390 Urinary tract infection, site not specified: Secondary | ICD-10-CM | POA: Diagnosis not present

## 2013-02-26 DIAGNOSIS — E7800 Pure hypercholesterolemia, unspecified: Secondary | ICD-10-CM | POA: Diagnosis not present

## 2013-03-02 ENCOUNTER — Encounter: Payer: Self-pay | Admitting: Gynecology

## 2013-03-02 ENCOUNTER — Ambulatory Visit (INDEPENDENT_AMBULATORY_CARE_PROVIDER_SITE_OTHER): Payer: Medicare Other | Admitting: Gynecology

## 2013-03-02 ENCOUNTER — Encounter: Payer: Self-pay | Admitting: Obstetrics and Gynecology

## 2013-03-02 VITALS — BP 124/78 | Ht 66.0 in | Wt 194.0 lb

## 2013-03-02 DIAGNOSIS — N952 Postmenopausal atrophic vaginitis: Secondary | ICD-10-CM | POA: Diagnosis not present

## 2013-03-02 DIAGNOSIS — Z79890 Hormone replacement therapy (postmenopausal): Secondary | ICD-10-CM | POA: Diagnosis not present

## 2013-03-02 DIAGNOSIS — N951 Menopausal and female climacteric states: Secondary | ICD-10-CM

## 2013-03-02 MED ORDER — ESTRADIOL-NORETHINDRONE ACET 0.05-0.14 MG/DAY TD PTTW: MEDICATED_PATCH | TRANSDERMAL | Status: DC

## 2013-03-02 NOTE — Patient Instructions (Addendum)
Schedule colonoscopy with [facility] at [phone] or [facility] at [phone] Schedule Bone density through Dr [name] office

## 2013-03-02 NOTE — Progress Notes (Signed)
Tara Potts [birth year] [id number]        79 y.o.  G1P1001 for followup exam.  Former patient of Dr. Cherylann Potts. Several issues noted below.  Past medical history,surgical history, medications, allergies, family history and social history were all reviewed and documented in the EPIC chart.  ROS:  Performed and pertinent positives and negatives are included in the history, assessment and plan .  Exam: Kim assistant Filed Vitals:   03/02/13 0951  BP: 124/78  Height: 5\' 6"  (1.676 m)  Weight: 194 lb (87.998 kg)   General appearance  Normal Skin chest multiple seborrheic keratoses Head/Neck normal with no cervical or supraclavicular adenopathy thyroid normal Lungs  clear Cardiac RR, without RMG Abdominal  soft, nontender, without masses, organomegaly or hernia Breasts  examined lying and sitting without masses, retractions, discharge or axillary adenopathy. Pelvic  Ext/BUS/vagina  normal with atrophic changes  Cervix  normal with atrophic changes  Uterus  anteverted, normal size, shape and contour, midline and mobile nontender   Adnexa  Without masses or tenderness    Anus and perineum  normal   Rectovaginal  normal sphincter tone without palpated masses or tenderness.    Assessment/Plan:  79 y.o. G1P1001 female for followup exam.   1. HRT. Patient is on CombiPatch 0.05/0.14. Instead of twice weekly she uses it once weekly. Whenever she tries to stop she has recurrent hot flashes, night sweats and just doesn't feel well overall.  I reviewed the whole issue of HRT with her to include the WHI study with increased risk of stroke, heart attack, DVT and breast cancer. The ACOG and NAMS statements for lowest dose for the shortest period of time reviewed. Transdermal versus oral first-pass effect benefit discussed. The unusual circumstance of using HRT in a late 23s-year-old woman particularly with a cardiac history was also reviewed. After a lengthy discussion the patient wants to continue at  present clearly understanding the risks but will try to wean this coming winter. I refilled her x1 year.  2. Multiple benign-appearing seborrheic keratoses. Patient actively see's a dermatologist on a routine basis for surveillance.  3. Pap smear 2012. No Pap smear done today. No history of abnormal Pap smears previously. Reviewed current screening guidelines. Options to stop screening altogether versus less frequent screening intervals reviewed. We'll readdress on an annual basis. 4. Mammography 02/2013. We'll continue with annual mammography. SBE monthly reviewed. 5. DEXA reported several years ago through Dr. [name] office. Recommend she followup with him as to when he recommends repeat bone density. 6. Colonoscopy never. I strongly urged her to consider screening colonoscopy. Benefits of early detection reviewed. Patient declines. 7. Health maintenance. No blood work done as this is all done through Dr. [name] office. Followup one year, sooner as needed.   Note: This document was prepared with digital dictation and possible smart phrase technology. Any transcriptional errors that result from this process are unintentional.   Tara Auerbach MD, 10:28 AM 03/02/2013

## 2013-03-04 DIAGNOSIS — I10 Essential (primary) hypertension: Secondary | ICD-10-CM | POA: Diagnosis not present

## 2013-03-04 DIAGNOSIS — I2510 Atherosclerotic heart disease of native coronary artery without angina pectoris: Secondary | ICD-10-CM | POA: Diagnosis not present

## 2013-03-04 DIAGNOSIS — E789 Disorder of lipoprotein metabolism, unspecified: Secondary | ICD-10-CM | POA: Diagnosis not present

## 2013-03-11 ENCOUNTER — Encounter: Payer: Self-pay | Admitting: Obstetrics and Gynecology

## 2013-03-23 DIAGNOSIS — R252 Cramp and spasm: Secondary | ICD-10-CM | POA: Diagnosis not present

## 2013-03-23 DIAGNOSIS — E789 Disorder of lipoprotein metabolism, unspecified: Secondary | ICD-10-CM | POA: Diagnosis not present

## 2013-03-23 DIAGNOSIS — Z780 Asymptomatic menopausal state: Secondary | ICD-10-CM | POA: Diagnosis not present

## 2013-03-23 DIAGNOSIS — E7800 Pure hypercholesterolemia, unspecified: Secondary | ICD-10-CM | POA: Diagnosis not present

## 2013-03-23 DIAGNOSIS — I2510 Atherosclerotic heart disease of native coronary artery without angina pectoris: Secondary | ICD-10-CM | POA: Diagnosis not present

## 2013-04-09 DIAGNOSIS — N390 Urinary tract infection, site not specified: Secondary | ICD-10-CM | POA: Diagnosis not present

## 2013-04-09 DIAGNOSIS — R3915 Urgency of urination: Secondary | ICD-10-CM | POA: Diagnosis not present

## 2013-04-21 DIAGNOSIS — N390 Urinary tract infection, site not specified: Secondary | ICD-10-CM | POA: Diagnosis not present

## 2013-05-13 DIAGNOSIS — I10 Essential (primary) hypertension: Secondary | ICD-10-CM | POA: Diagnosis not present

## 2013-05-13 DIAGNOSIS — E789 Disorder of lipoprotein metabolism, unspecified: Secondary | ICD-10-CM | POA: Diagnosis not present

## 2013-05-14 DIAGNOSIS — L820 Inflamed seborrheic keratosis: Secondary | ICD-10-CM | POA: Diagnosis not present

## 2013-05-14 DIAGNOSIS — I10 Essential (primary) hypertension: Secondary | ICD-10-CM | POA: Diagnosis not present

## 2013-05-14 DIAGNOSIS — E7800 Pure hypercholesterolemia, unspecified: Secondary | ICD-10-CM | POA: Diagnosis not present

## 2013-05-28 DIAGNOSIS — Z961 Presence of intraocular lens: Secondary | ICD-10-CM | POA: Diagnosis not present

## 2013-05-28 DIAGNOSIS — H5210 Myopia, unspecified eye: Secondary | ICD-10-CM | POA: Diagnosis not present

## 2013-05-28 DIAGNOSIS — H35379 Puckering of macula, unspecified eye: Secondary | ICD-10-CM | POA: Diagnosis not present

## 2013-07-14 ENCOUNTER — Other Ambulatory Visit: Payer: Self-pay | Admitting: Cardiovascular Disease

## 2013-07-14 NOTE — Telephone Encounter (Signed)
Rx was sent to pharmacy electronically. 

## 2013-07-21 ENCOUNTER — Ambulatory Visit (INDEPENDENT_AMBULATORY_CARE_PROVIDER_SITE_OTHER): Payer: Medicare Other | Admitting: Cardiovascular Disease

## 2013-07-21 ENCOUNTER — Encounter: Payer: Self-pay | Admitting: Cardiovascular Disease

## 2013-07-21 VITALS — BP 140/86 | HR 62 | Ht 66.0 in | Wt 205.0 lb

## 2013-07-21 DIAGNOSIS — I10 Essential (primary) hypertension: Secondary | ICD-10-CM

## 2013-07-21 DIAGNOSIS — I739 Peripheral vascular disease, unspecified: Secondary | ICD-10-CM | POA: Diagnosis not present

## 2013-07-21 DIAGNOSIS — E7800 Pure hypercholesterolemia, unspecified: Secondary | ICD-10-CM

## 2013-07-21 DIAGNOSIS — I2510 Atherosclerotic heart disease of native coronary artery without angina pectoris: Secondary | ICD-10-CM | POA: Diagnosis not present

## 2013-07-21 MED ORDER — NITROGLYCERIN 0.4 MG/SPRAY TL SOLN: 1.0000 | Status: DC | PRN

## 2013-07-21 NOTE — Assessment & Plan Note (Signed)
Well-controlled on current medications 

## 2013-07-21 NOTE — Patient Instructions (Addendum)
Your physician wants you to follow-up in: 6 months with Tarri Fuller [facility] and 1 year with Dr Gwenlyn Found. You will receive a reminder letter in the mail two months in advance. If you don't receive a letter, please call our office to schedule the follow-up appointment.   Dr Gwenlyn Found would like for you to have your carotid dopplers done.

## 2013-07-21 NOTE — Assessment & Plan Note (Signed)
Status post coronary artery bypass grafting November 2007 at [facility]. Her last Myoview stress test performed 01/12/11 with nonischemic. She denies chest pain or shortness of breath.

## 2013-07-21 NOTE — Assessment & Plan Note (Signed)
On statin therapy followed by her PCP. We'll obtain her most recent lab work

## 2013-07-21 NOTE — Progress Notes (Signed)
07/21/2013 Tara Potts   11-Mar-1934  [id number]  Primary Physician Horatio Pel, MD Primary Cardiologist: Lorretta Harp MD Renae Gloss   HPI:  The patient is a very pleasant, 80 year old, mildly overweight, widowed [place] American female, mother of 2, grandmother to 1 grandchild who I saw 6 months ago. Her husband Tara Potts was also a patient of mine and is deceased. She has a history of CAD status post coronary artery bypass grafting November 2007 at [facility]. Her other problems include hypertension, hyperlipidemia and mild carotid disease. She had a Myoview stress test performed January 12, 2011, which was nonischemic. Carotid Dopplers performed August 27, 2011, showed moderate right and mild left ICA stenosis scheduled to be redone in February of next year. I last cath'd her January 09, 2008, revealing diffusely diseased graft to the PDA as well as diffuse PDA disease and I elected to treat her medically at that time. Her major complaints are nocturnal leg cramps. She saw Tenny Craw PAC back in the office 6 months ago has been doing well since. She denies chest pain or shortness of breath.     Current Outpatient Prescriptions  Medication Sig Dispense Refill  . Ascorbic Acid (VITAMIN C PO) Take by mouth.        [name] aspirin 325 MG tablet Take 325 mg by mouth daily.        [name] CALCIUM PO Take by mouth.        . co-enzyme Q-10 30 MG capsule Take 30 mg by mouth 3 (three) times daily.      . cyclobenzaprine (FLEXERIL) 10 MG tablet Take 10 mg by mouth at bedtime as needed.      [name] estradiol-norethindrone (COMBIPATCH) 0.05-0.14 MG/DAY PLACE 1 PATCH ONTO SKIN 2 TIMES A WEEK  8 patch  11  . fish oil-omega-3 fatty acids 1000 MG capsule Take 2 g by mouth daily.        [name] glucosamine-chondroitin 500-400 MG tablet Take 2 tablets by mouth 2 (two) times daily.      . metoprolol (LOPRESSOR) 50 MG tablet TAKE 1 TABLET BY MOUTH TWICE DAILY  60 tablet  6  . Multiple Vitamin  (MULTIVITAMIN) tablet Take 1 tablet by mouth daily.        . nitroGLYCERIN (NITROLINGUAL) 0.4 MG/SPRAY spray Place 1 spray under the tongue every 5 (five) minutes as needed for chest pain.  12 g  1  . rosuvastatin (CRESTOR) 40 MG tablet Take 20 mg by mouth daily.        No current facility-administered medications for this visit.    No Known Allergies  History   Social History  . Marital Status: Widowed    Spouse Name: N/A    Number of Children: N/A  . Years of Education: N/A   Occupational History  . Not on file.   Social History Main Topics  . Smoking status: Former Research scientist (life sciences)  . Smokeless tobacco: Never Used  . Alcohol Use: No  . Drug Use: No  . Sexual Activity: Yes    Birth Control/ Protection: Post-menopausal   Other Topics Concern  . Not on file   Social History Narrative  . No narrative on file     Review of Systems: General: negative for chills, fever, night sweats or weight changes.  Cardiovascular: negative for chest pain, dyspnea on exertion, edema, orthopnea, palpitations, paroxysmal nocturnal dyspnea or shortness of breath Dermatological: negative for rash Respiratory: negative for cough or wheezing Urologic: negative for hematuria  Abdominal: negative for nausea, vomiting, diarrhea, bright red blood per rectum, melena, or hematemesis Neurologic: negative for visual changes, syncope, or dizziness All other systems reviewed and are otherwise negative except as noted above.    Blood pressure 140/86, pulse 62, height 5\' 6"  (1.676 m), weight 205 lb (92.987 kg), last menstrual period 07/16/2002.  General appearance: alert and no distress Neck: no adenopathy, no JVD, supple, symmetrical, trachea midline, thyroid not enlarged, symmetric, no tenderness/mass/nodules and right carotid artery Lungs: clear to auscultation bilaterally Heart: regular rate and rhythm, S1, S2 normal, no murmur, click, rub or gallop Extremities: extremities normal, atraumatic, no cyanosis  or edema  EKG normal sinus rhythm at 62 without ST or T wave changes  ASSESSMENT AND PLAN:   Coronary artery disease Status post coronary artery bypass grafting November 2007 at [facility]. Her last Myoview stress test performed 01/12/11 with nonischemic. She denies chest pain or shortness of breath.  Elevated cholesterol On statin therapy followed by her PCP. We'll obtain her most recent lab work  Essential hypertension Well-controlled on current medications      Lorretta Harp MD [facility], [facility] 07/21/2013 11:08 AM

## 2013-07-22 ENCOUNTER — Encounter: Payer: Self-pay | Admitting: Cardiovascular Disease

## 2013-08-04 ENCOUNTER — Encounter (HOSPITAL_COMMUNITY): Payer: Medicare Other

## 2013-08-10 ENCOUNTER — Telehealth (HOSPITAL_COMMUNITY): Payer: Self-pay | Admitting: *Deleted

## 2013-08-11 ENCOUNTER — Telehealth (HOSPITAL_COMMUNITY): Payer: Self-pay | Admitting: *Deleted

## 2013-08-19 ENCOUNTER — Telehealth (HOSPITAL_COMMUNITY): Payer: Self-pay | Admitting: *Deleted

## 2013-08-28 DIAGNOSIS — E789 Disorder of lipoprotein metabolism, unspecified: Secondary | ICD-10-CM | POA: Diagnosis not present

## 2013-09-03 DIAGNOSIS — I2510 Atherosclerotic heart disease of native coronary artery without angina pectoris: Secondary | ICD-10-CM | POA: Diagnosis not present

## 2013-09-03 DIAGNOSIS — E789 Disorder of lipoprotein metabolism, unspecified: Secondary | ICD-10-CM | POA: Diagnosis not present

## 2013-09-08 ENCOUNTER — Encounter (HOSPITAL_COMMUNITY): Payer: Medicare Other

## 2013-09-18 ENCOUNTER — Ambulatory Visit (HOSPITAL_COMMUNITY)
Admission: RE | Admit: 2013-09-18 | Discharge: 2013-09-18 | Disposition: A | Discharge status: Home / Self Care | Payer: Medicare Other | Source: Ambulatory Visit | Attending: Cardiovascular Disease | Admitting: Cardiovascular Disease

## 2013-09-18 DIAGNOSIS — I739 Peripheral vascular disease, unspecified: Secondary | ICD-10-CM | POA: Diagnosis not present

## 2013-09-18 NOTE — Progress Notes (Signed)
Carotid Duplex Completed. °Brianna L Mazza,RVT °

## 2013-09-28 ENCOUNTER — Telehealth: Payer: Self-pay | Admitting: *Deleted

## 2013-09-28 ENCOUNTER — Encounter: Payer: Self-pay | Admitting: *Deleted

## 2013-09-28 DIAGNOSIS — I6529 Occlusion and stenosis of unspecified carotid artery: Secondary | ICD-10-CM

## 2013-09-28 NOTE — Telephone Encounter (Signed)
Message copied by Chauncy Lean on Mon Sep 28, 2013  7:21 PM ------      Message from: Lorretta Harp      Created: Sun Sep 27, 2013  6:44 PM       No change from prior study. Repeat in 12 months. ------

## 2013-09-28 NOTE — Telephone Encounter (Signed)
Order placed for repeat carotid dopplers in 1 year  

## 2013-12-28 ENCOUNTER — Other Ambulatory Visit: Payer: Self-pay | Admitting: Cardiovascular Disease

## 2013-12-29 NOTE — Telephone Encounter (Signed)
Spoke with patient about Losartan-HCTZ, last appointment  AVS stated she was not taking this medication. Patient said she quit taking it for a few months because it was making her "legs draw" and has recently started taking it again but cutting it in half because she felt "100-12.5 was too much medication" I spoke with Curt Bears and she instructed that the patient come in and see either Erasmo Downer or an extender for medication reevaluation. Called patient backto give the instructions and patient voiced understanding

## 2013-12-30 DIAGNOSIS — E789 Disorder of lipoprotein metabolism, unspecified: Secondary | ICD-10-CM | POA: Diagnosis not present

## 2013-12-31 DIAGNOSIS — M79609 Pain in unspecified limb: Secondary | ICD-10-CM | POA: Diagnosis not present

## 2013-12-31 DIAGNOSIS — E789 Disorder of lipoprotein metabolism, unspecified: Secondary | ICD-10-CM | POA: Diagnosis not present

## 2014-01-04 ENCOUNTER — Encounter: Payer: Self-pay | Admitting: Pharmacist Clinician (PhC)/ Clinical Pharmacy Specialist

## 2014-01-04 ENCOUNTER — Ambulatory Visit (INDEPENDENT_AMBULATORY_CARE_PROVIDER_SITE_OTHER): Payer: Medicare Other | Admitting: Pharmacist Clinician (PhC)/ Clinical Pharmacy Specialist

## 2014-01-04 VITALS — BP 164/66 | HR 68

## 2014-01-04 DIAGNOSIS — I2510 Atherosclerotic heart disease of native coronary artery without angina pectoris: Secondary | ICD-10-CM | POA: Diagnosis not present

## 2014-01-04 DIAGNOSIS — I10 Essential (primary) hypertension: Secondary | ICD-10-CM | POA: Diagnosis not present

## 2014-01-04 NOTE — Progress Notes (Signed)
01/04/2014 Tara Potts Nov 12, 1933 [id number]   HPI:  Tara Potts is a 80 y.o. female patient of Dr Gwenlyn Found, with a PMH below who presents today for medication management.  She is apparently bothered by frequent leg cramps and recently decided that it must be related to her losartan.  She was taking 100mg  daily and stopped the medication for a week or more.  When the leg cramps persisted, she re-started the medication, but now cuts the tablets in half.  She then decided that it must be her metoprolol causing the problem, but fortunately was afraid to stop that medication.  She mentions that one MD recommended tonic water, so she's been drinking that, but again did not seem to cure her cramps.  She sees Dr. Shelia Media for her cholesterol management, and saw him last week, although I cannot determine from our conversation if she discussed holding Crestor for a few weeks to see if it is the cause.  She is a firm believer in vitamins, and takes about 8 different ones on a daily basis.  These are from [employer], and tend to be high potency - MVI, Vitamin C, B complex, Calcium, glucosamine/chondroitin, omega 3 fish oil, and several others.  She also drinks an energy drink produced by the same company, usually once daily.    Current Outpatient Prescriptions  Medication Sig Dispense Refill  . Ascorbic Acid (VITAMIN C PO) Take by mouth.        [name] CALCIUM PO Take by mouth.        . co-enzyme Q-10 30 MG capsule Take 30 mg by mouth 3 (three) times daily.      [name] estradiol-norethindrone (COMBIPATCH) 0.05-0.14 MG/DAY PLACE 1 PATCH ONTO SKIN 2 TIMES A WEEK  8 patch  11  . fish oil-omega-3 fatty acids 1000 MG capsule Take 2 g by mouth daily.        [name] glucosamine-chondroitin 500-400 MG tablet Take 2 tablets by mouth 2 (two) times daily.      . metoprolol (LOPRESSOR) 50 MG tablet TAKE 1 TABLET BY MOUTH TWICE DAILY  60 tablet  6  . Multiple Vitamin (MULTIVITAMIN) tablet Take 1 tablet by mouth daily.        .  nitroGLYCERIN (NITROLINGUAL) 0.4 MG/SPRAY spray Place 1 spray under the tongue every 5 (five) minutes as needed for chest pain.  12 g  1  . rosuvastatin (CRESTOR) 40 MG tablet Take 20 mg by mouth daily.       [name] aspirin 325 MG tablet Take 325 mg by mouth daily.        . cyclobenzaprine (FLEXERIL) 10 MG tablet Take 10 mg by mouth at bedtime as needed.       No current facility-administered medications for this visit.    No Known Allergies  Past Medical History  Diagnosis Date  . Fibroid   . Menopausal symptoms   . CAD (coronary artery disease)   . Hyperlipemia   . Hypertension   . Carotid disease, bilateral     mild  . Hx of CABG 2007    [facility]    Blood pressure 164/66, pulse 68, last menstrual period 07/16/2002.   ASSESSMENT AND PLAN:  I explained to [name], that there is a chance the leg cramps are caused by her Crestor, but as Dr. Shelia Media manages her cholesterol, she needs to discuss other treatment options with him.  I am concerned about her high blood pressure today, but she states  she has forgotten several doses of metoprolol recently.  We discussed the importance of taking her medications every day, just like she takes her supplements.  I did not want to make any medication adjustments due to her non-compliance, but she is due to see an extender in the next month.  I asked that she check her BP at home daily and then next month we can determine what her BP is truly running while on meds.  We will probably need to increase her losartan back to 100mg  daily if she remains hypertensive.   Tommy Medal PharmD CPP [facility]

## 2014-01-04 NOTE — Patient Instructions (Signed)
Return for a a follow up appointment with Dr. Kennon Holter PA in July  Your blood pressure today is 164/66  Check your blood pressure at home 3-4 times per week and keep record of the readings. (goal <150/90)  Don't skip doses of metoprolol.   Bring all of your meds, your BP cuff and your record of home blood pressures to your next appointment.  Exercise as you're able, try to walk approximately 30 minutes per day.  Keep salt intake to a minimum, especially watch canned and prepared boxed foods.  Eat more fresh fruits and vegetables and fewer canned items.  Avoid eating in fast food restaurants.    HOW TO TAKE YOUR BLOOD PRESSURE:   Rest 5 minutes before taking your blood pressure.    Don't smoke or drink caffeinated beverages for at least 30 minutes before.   Take your blood pressure before (not after) you eat.   Sit comfortably with your back supported and both feet on the floor (don't cross your legs).   Elevate your arm to heart level on a table or a desk.   Use the proper sized cuff. It should fit smoothly and snugly around your bare upper arm. There should be enough room to slip a fingertip under the cuff. The bottom edge of the cuff should be 1 inch above the crease of the elbow.   Ideally, take 3 measurements at one sitting and record the average.

## 2014-02-04 ENCOUNTER — Ambulatory Visit: Payer: Medicare Other | Admitting: Cardiology

## 2014-02-16 ENCOUNTER — Other Ambulatory Visit: Payer: Self-pay | Admitting: Cardiovascular Disease

## 2014-02-16 NOTE — Telephone Encounter (Signed)
Rx was sent to pharmacy electronically. 

## 2014-02-17 ENCOUNTER — Encounter: Payer: Self-pay | Admitting: Cardiology

## 2014-02-17 ENCOUNTER — Ambulatory Visit (INDEPENDENT_AMBULATORY_CARE_PROVIDER_SITE_OTHER): Payer: Medicare Other | Admitting: Cardiology

## 2014-02-17 VITALS — BP 186/79 | HR 71 | Ht 66.0 in | Wt 203.2 lb

## 2014-02-17 DIAGNOSIS — Z79899 Other long term (current) drug therapy: Secondary | ICD-10-CM | POA: Diagnosis not present

## 2014-02-17 DIAGNOSIS — I10 Essential (primary) hypertension: Secondary | ICD-10-CM

## 2014-02-17 DIAGNOSIS — E7800 Pure hypercholesterolemia, unspecified: Secondary | ICD-10-CM

## 2014-02-17 DIAGNOSIS — Z951 Presence of aortocoronary bypass graft: Secondary | ICD-10-CM | POA: Diagnosis not present

## 2014-02-17 DIAGNOSIS — R5383 Other fatigue: Secondary | ICD-10-CM

## 2014-02-17 DIAGNOSIS — R5381 Other malaise: Secondary | ICD-10-CM | POA: Diagnosis not present

## 2014-02-17 DIAGNOSIS — I2510 Atherosclerotic heart disease of native coronary artery without angina pectoris: Secondary | ICD-10-CM

## 2014-02-17 DIAGNOSIS — R252 Cramp and spasm: Secondary | ICD-10-CM | POA: Diagnosis not present

## 2014-02-17 MED ORDER — POTASSIUM CHLORIDE ER 10 MEQ PO TBCR: 10.0000 meq | EXTENDED_RELEASE_TABLET | Freq: Every day | ORAL | Status: DC

## 2014-02-17 MED ORDER — LOSARTAN POTASSIUM-HCTZ 50-12.5 MG PO TABS: 1.0000 | ORAL_TABLET | Freq: Every day | ORAL | Status: DC

## 2014-02-17 NOTE — Progress Notes (Signed)
02/21/2014   PCP: Horatio Pel, MD   Chief Complaint  Patient presents with  . Follow-up    As per Tara Potts needed to be seen for Hypertension, pt c/o of swelling in legs and ankles, pt denied SOB and Chest Pain    Primary Cardiologist:Dr. Adora Fridge   HPI:  80 year old, mildly overweight, widowed [place] American female, mother of 2, grandmother to 1 grandchild who I saw 6 months ago. She has a history of CAD status post coronary artery bypass grafting November 2007 at [facility]. Her other problems include hypertension, hyperlipidemia and mild carotid disease. She had a Myoview stress test performed January 12, 2011, which was nonischemic. Carotid Dopplers performed August 27, 2011, showed moderate right and mild left ICA stenosis - dopplers scheduled to be redone in February of next year. Dr. Adora Fridge last cath'd her January 09, 2008, revealing diffusely diseased graft to the PDA as well as diffuse PDA disease and I elected to treat her medically at that time. Her major complaints are nocturnal leg cramps.   Because of the leg cramps she has stopped taking most of her medicine and she blames it on the medication.  She has not taken her Cozaar nor her Lopressor. She stopped taking Crestor 2 months ago.  Today she is complaining of feet swelling as well.   No Known Allergies  Current Outpatient Prescriptions  Medication Sig Dispense Refill  . Ascorbic Acid (VITAMIN C PO) Take by mouth.        [name] aspirin 325 MG tablet Take 325 mg by mouth daily.        [name] CALCIUM PO Take by mouth.        . co-enzyme Q-10 30 MG capsule Take 30 mg by mouth 3 (three) times daily.      . cyclobenzaprine (FLEXERIL) 10 MG tablet Take 10 mg by mouth at bedtime as needed.      [name] estradiol-norethindrone (COMBIPATCH) 0.05-0.14 MG/DAY PLACE 1 PATCH ONTO SKIN 2 TIMES A WEEK  8 patch  11  . fish oil-omega-3 fatty acids 1000 MG capsule Take 2 g by mouth daily.        [name] glucosamine-chondroitin  500-400 MG tablet Take 2 tablets by mouth 2 (two) times daily.      . metoprolol (LOPRESSOR) 50 MG tablet TAKE 1 TABLET BY MOUTH TWICE DAILY  60 tablet  5  . Multiple Vitamin (MULTIVITAMIN) tablet Take 1 tablet by mouth daily.        . nitroGLYCERIN (NITROLINGUAL) 0.4 MG/SPRAY spray Place 1 spray under the tongue every 5 (five) minutes as needed for chest pain.  12 g  1  . rosuvastatin (CRESTOR) 40 MG tablet Take 20 mg by mouth daily. ON HOLD      . losartan-hydrochlorothiazide (HYZAAR) 50-12.5 MG per tablet Take 1 tablet by mouth daily.  30 tablet  6  . potassium chloride (K-DUR) 10 MEQ tablet Take 1 tablet (10 mEq total) by mouth daily.  30 tablet  6   No current facility-administered medications for this visit.    Past Medical History  Diagnosis Date  . Fibroid   . Menopausal symptoms   . CAD (coronary artery disease)   . Hyperlipemia   . Hypertension   . Carotid disease, bilateral     mild  . Hx of CABG 2007    [facility]    Past Surgical History  Procedure Laterality Date  . Mass removed from  cervix  12/1997    Benign endocervical inclusion cysts  . Foot surgery    . Triple heart bypass  05/2006    [place] surgery    . Nm myoview ltd  01/12/2011    no ischemia  . Carotid doppler  08/27/2011    mod. right & nild left ICA stenosis  . Cardiac catheterization  01/09/2008    diffusely diseased graft to the PDA & diffuse PDA disease  . US echocardiography  10/30/2007    mild MA,MR,TR,AOV mildly sclerotic,density oted in the prox asc aorta  . Coronary artery bypass graft      [id number] colds or fevers, increase of weight since 02/2013 Skin:no rashes or ulcers HEENT:no blurred vision, no congestion CV:see HPI, + feet swelling PUL:see HPI GI:no diarrhea constipation or melena, no indigestion GU:no hematuria, no dysuria MS:no joint pain, no claudication Neuro:no syncope, no lightheadedness Endo:no diabetes, no thyroid disease  Wt Readings  from Last 3 Encounters:  02/17/14 203 lb 3.2 oz (92.171 kg)  07/21/13 205 lb (92.987 kg)  03/02/13 194 lb (87.998 kg)    PHYSICAL EXAM BP 186/79  Pulse 71  Ht 5\' 6"  (1.676 m)  Wt 203 lb 3.2 oz (92.171 kg)  BMI 32.81 kg/m2  LMP 07/16/2002 General:Pleasant affect, NAD Skin:Warm and dry, brisk capillary refill HEENT:normocephalic, sclera clear, mucus membranes moist Neck:supple, no JVD, no bruits  Heart:S1S2 RRR without murmur, gallup, rub or click Lungs:clear without rales, rhonchi, or wheezes [id number], non tender, + BS, do not palpate liver spleen or masses Ext:1-2+ lower ext edema, 2+ pedal pulses, 2+ radial pulses Neuro:alert and oriented, MAE, follows commands, + facial symmetry EKG:SR lt atrial enlargement no acute changes, HR 71  ASSESSMENT AND PLAN Bilateral leg cramps Because of the leg cramps she has stopped her medications for blood pressure and has his lower extremity edema as well.  We'll resume her losartan and  Add HCTZ along with potassium to see if this will help reduce the edema controlled blood pressure and hopefully help prevent some leg cramps.  I've asked her to stop each of her vitamins per week at a time to see if they add to her leg cramps. I also instructed her she could use her Flexeril if needed for leg cramps at night as this is a muscle relaxant she thought it was a sleeping pill only.  Essential hypertension Her blood pressure is elevated today secondary to not taking the Lopressor nor is a [facility] complete her on losartan/HCTZ 50/12 and half daily.  She'll need a followup blood pressure check.  The resume the Lopressor.  Status post coronary artery bypass grafting: 2007 No chest pain and no shortness of breath EKG without acute changes  Elevated cholesterol She will need to hold her Crestor for now until we can decide about her leg cramps. She is to eating tonic water has helped her leg cramps as well.    We'll check lab work today.  I will see her  back in 3 weeks for blood pressure followup and further adjustments.

## 2014-02-17 NOTE — Patient Instructions (Addendum)
Your physician recommends that you schedule a follow-up appointment in: 2 weeks  Please check Blood pressure on a daily basis, watch your salt intake no more that 2000, Hold crestor, we will decide at next office visit.  Your physician has recommended you make the following change in your medication: Start Losartan/HCTZ 50/12.5 mg daily and Potassium 10 meq daily

## 2014-02-18 LAB — BASIC METABOLIC PANEL
BUN: 19 mg/dL (ref 6–23)
CALCIUM: 9.2 mg/dL (ref 8.4–10.5)
CO2: 25 meq/L (ref 19–32)
Chloride: 104 mEq/L (ref 96–112)
Creat: 1.07 mg/dL (ref 0.50–1.10)
GLUCOSE: 81 mg/dL (ref 70–99)
Potassium: 4.4 mEq/L (ref 3.5–5.3)
Sodium: 137 mEq/L (ref 135–145)

## 2014-02-18 LAB — MAGNESIUM: Magnesium: 2.1 mg/dL (ref 1.5–2.5)

## 2014-02-19 ENCOUNTER — Telehealth: Payer: Self-pay | Admitting: *Deleted

## 2014-02-19 NOTE — Telephone Encounter (Signed)
Left message for patient about her lab result, told patient to give [place] a call if there be anymore questions

## 2014-02-19 NOTE — Telephone Encounter (Signed)
Message copied by Vennie Homans on Fri Feb 19, 2014  8:17 AM ------      Message from: Isaiah Serge      Created: Thu Feb 18, 2014  7:56 AM       Please let pt know her K+ and Na and Mg+ are all normal. ------

## 2014-02-21 DIAGNOSIS — R252 Cramp and spasm: Secondary | ICD-10-CM | POA: Insufficient documentation

## 2014-02-21 NOTE — Assessment & Plan Note (Signed)
Her blood pressure is elevated today secondary to not taking the Lopressor nor is a [facility] complete her on losartan/HCTZ 50/12 and half daily.  She'll need a followup blood pressure check.  The resume the Lopressor.

## 2014-02-21 NOTE — Assessment & Plan Note (Signed)
Because of the leg cramps she has stopped her medications for blood pressure and has his lower extremity edema as well.  We'll resume her losartan and  Add HCTZ along with potassium to see if this will help reduce the edema controlled blood pressure and hopefully help prevent some leg cramps.  I've asked her to stop each of her vitamins per week at a time to see if they add to her leg cramps. I also instructed her she could use her Flexeril if needed for leg cramps at night as this is a muscle relaxant she thought it was a sleeping pill only.

## 2014-02-21 NOTE — Assessment & Plan Note (Signed)
No chest pain and no shortness of breath EKG without acute changes

## 2014-02-21 NOTE — Assessment & Plan Note (Signed)
She will need to hold her Crestor for now until we can decide about her leg cramps. She is to eating tonic water has helped her leg cramps as well.

## 2014-03-03 ENCOUNTER — Ambulatory Visit (INDEPENDENT_AMBULATORY_CARE_PROVIDER_SITE_OTHER): Payer: Medicare Other | Admitting: Gynecology

## 2014-03-03 ENCOUNTER — Other Ambulatory Visit (HOSPITAL_COMMUNITY)
Admission: RE | Admit: 2014-03-03 | Discharge: 2014-03-03 | Disposition: A | Discharge status: Home / Self Care | Payer: Medicare Other | Source: Ambulatory Visit | Attending: Gynecology | Admitting: Gynecology

## 2014-03-03 ENCOUNTER — Encounter: Payer: Self-pay | Admitting: Gynecology

## 2014-03-03 VITALS — BP 122/74 | Ht 67.0 in | Wt 201.0 lb

## 2014-03-03 DIAGNOSIS — N889 Noninflammatory disorder of cervix uteri, unspecified: Secondary | ICD-10-CM

## 2014-03-03 DIAGNOSIS — N952 Postmenopausal atrophic vaginitis: Secondary | ICD-10-CM | POA: Diagnosis not present

## 2014-03-03 DIAGNOSIS — I2510 Atherosclerotic heart disease of native coronary artery without angina pectoris: Secondary | ICD-10-CM

## 2014-03-03 DIAGNOSIS — Z124 Encounter for screening for malignant neoplasm of cervix: Secondary | ICD-10-CM | POA: Diagnosis not present

## 2014-03-03 DIAGNOSIS — Z79890 Hormone replacement therapy (postmenopausal): Secondary | ICD-10-CM

## 2014-03-03 DIAGNOSIS — N951 Menopausal and female climacteric states: Secondary | ICD-10-CM | POA: Diagnosis not present

## 2014-03-03 LAB — URINALYSIS W MICROSCOPIC + REFLEX CULTURE
Bacteria, UA: NONE SEEN
Bilirubin Urine: NEGATIVE
CASTS: NONE SEEN
Crystals: NONE SEEN
Glucose, UA: NEGATIVE mg/dL
Hgb urine dipstick: NEGATIVE
KETONES UR: NEGATIVE mg/dL
Leukocytes, UA: NEGATIVE
Nitrite: NEGATIVE
PH: 5.5 (ref 5.0–8.0)
Protein, ur: NEGATIVE mg/dL
Specific Gravity, Urine: 1.019 (ref 1.005–1.030)
Urobilinogen, UA: 0.2 mg/dL (ref 0.0–1.0)

## 2014-03-03 MED ORDER — ESTRADIOL-NORETHINDRONE ACET 0.05-0.14 MG/DAY TD PTTW: 1.0000 | MEDICATED_PATCH | TRANSDERMAL | Status: DC

## 2014-03-03 NOTE — Progress Notes (Signed)
Tara Potts [birth year] [id number]        80 y.o.  G1P1001 for followup exam. Several issues noted below.   Past medical history,surgical history, problem list, medications, allergies, family history and social history were all reviewed and documented as reviewed in the EPIC chart.  ROS:  12 system ROS performed with pertinent positives and negatives included in the history, assessment and plan.   Additional significant findings :  Leg cramps   Exam: Kim assistant Filed Vitals:   03/03/14 1013  BP: 122/74  Height: 5\' 7"  (1.702 m)  Weight: 201 lb (91.173 kg)   General appearance:  Normal affect, orientation and appearance. Skin: Grossly normal HEENT: Without gross lesions.  No cervical or supraclavicular adenopathy. Thyroid normal.  Lungs:  Clear without wheezing, rales or rhonchi Cardiac: RR, without RMG Abdominal:  Soft, nontender, without masses, guarding, rebound, organomegaly or hernia Breasts:  Examined lying and sitting without masses, retractions, discharge or axillary adenopathy. Pelvic:  Ext/BUS/vagina with atrophic changes  Cervix cystic appearing area external os. Pap done  Uterus retroverted, grossly normal size, midline and mobile nontender   Adnexa  Without masses or tenderness    Anus and perineum  Normal   Rectovaginal  Normal sphincter tone without palpated masses or tenderness.    Assessment/Plan:  80 y.o. G1P1001 female for followup exam.   1. Postmenopausal/atrophic genital changes/HRT. Patient continues on CombiPatch although admits to not using it regularly changing a patch twice monthly.  I began reviewed the whole issue of HRT with her to include the WHI study with increased risk of stroke, heart attack, DVT and breast cancer. The ACOG and NAMS statements for lowest dose for the shortest period of time reviewed. Transdermal versus oral first-pass effect benefit discussed.  I discussed using HRT into the 70s and increased risk of thrombosis particularly  with her cardiac history. I recommended that she wean herself off this coming year and she agrees to do so. She has any issues afterwards she'll call me. No history of vaginal bleeding and she knows to report any vaginal bleeding. 2. Cervical cystic-appearing lesion. Probably nabothian cyst. Pap smear done to include this area. No history of abnormal Pap smears previously. I asked patient to schedule a colposcopy appointment so that we can take a closer look and possible biopsy. Patient agrees to schedule. 3. Mammography 02/2013. Patient knows to schedule now. SBE monthly reviewed. 4. DEXA reported at [place] office recently. I do not have a copy of this and I asked her to may sure she checks that was done recently. Increase calcium vitamin D reviewed. 5. Colonoscopy never. I again strongly recommended she schedule a colonoscopy. I reviewed colon cancer the second most common cancer in women and the benefits of early detection or precancerous polyp removal. Patient acknowledges my recommendation although does not appear that she will schedule anytime soon. Nasal numbers provided. 6. Health maintenance. Patient is complaining of leg cramps and swelling. She's already discussed this with her primary physician as well as her cardiologist. She is going to followup with them in reference to this. I did discuss the issues of possible cardiac etiology or peripheral artery disease and the importance of following up with them in reference to this. No routine blood work done as she reports this done at her primary physician's office.   Note: This document was prepared with digital dictation and possible smart phrase technology. Any transcriptional errors that result from this process are unintentional.   Anastasio Auerbach MD, 10:39  AM 03/03/2014

## 2014-03-03 NOTE — Addendum Note (Signed)
Addended by: Nelva Nay on: 03/03/2014 11:05 AM   Modules accepted: Orders

## 2014-03-03 NOTE — Patient Instructions (Signed)
Followup with your cardiologist and primary physician in reference to the leg swelling. Followup with me for the colposcopy appointment as scheduled to take a closer look at your cervix. Schedule colonoscopy with [facility] at [phone] or [facility]enter[facility] at [phone] Wean off of the estrogen patches. Call me if you have any issues with this. Report any vaginal bleeding.  You may obtain a copy of any labs that were done today by logging onto MyChart as outlined in the instructions provided with your AVS (after visit summary). The office will not call with normal lab results but certainly if there are any significant abnormalities then we will contact you.   Health Maintenance, Female A healthy lifestyle and preventative care can promote health and wellness.  Maintain regular health, dental, and eye exams.  Eat a healthy diet. Foods like vegetables, fruits, whole grains, low-fat dairy products, and lean protein foods contain the nutrients you need without too many calories. Decrease your intake of foods high in solid fats, added sugars, and salt. Get information about a proper diet from your caregiver, if necessary.  Regular physical exercise is one of the most important things you can do for your health. Most adults should get at least 150 minutes of moderate-intensity exercise (any activity that increases your heart rate and causes you to sweat) each week. In addition, most adults need muscle-strengthening exercises on 2 or more days a week.   Maintain a healthy weight. The body mass index (BMI) is a screening tool to identify possible weight problems. It provides an estimate of body fat based on height and weight. Your caregiver can help determine your BMI, and can help you achieve or maintain a healthy weight. For adults 20 years and older:  A BMI below 18.5 is considered underweight.  A BMI of 18.5 to 24.9 is normal.  A BMI of 25 to 29.9 is considered  overweight.  A BMI of 30 and above is considered obese.  Maintain normal blood lipids and cholesterol by exercising and minimizing your intake of saturated fat. Eat a balanced diet with plenty of fruits and vegetables. Blood tests for lipids and cholesterol should begin at age 15 and be repeated every 5 years. If your lipid or cholesterol levels are high, you are over 50, or you are a high risk for heart disease, you may need your cholesterol levels checked more frequently.Ongoing high lipid and cholesterol levels should be treated with medicines if diet and exercise are not effective.  If you smoke, find out from your caregiver how to quit. If you do not use tobacco, do not start.  Lung cancer screening is recommended for adults aged 83 80 years who are at high risk for developing lung cancer because of a history of smoking. Yearly low-dose computed tomography (CT) is recommended for people who have at least a 30-pack-year history of smoking and are a current smoker or have quit within the past 15 years. A pack year of smoking is smoking an average of 1 pack of cigarettes a day for 1 year (for example: 1 pack a day for 30 years or 2 packs a day for 15 years). Yearly screening should continue until the smoker has stopped smoking for at least 15 years. Yearly screening should also be stopped for people who develop a health problem that would prevent them from having lung cancer treatment.  If you are pregnant, do not drink alcohol. If you are breastfeeding, be very cautious about drinking alcohol. If you are  not pregnant and choose to drink alcohol, do not exceed 1 drink per day. One drink is considered to be 12 ounces (355 mL) of beer, 5 ounces (148 mL) of wine, or 1.5 ounces (44 mL) of liquor.  Avoid use of street drugs. Do not share needles with anyone. Ask for help if you need support or instructions about stopping the use of drugs.  High blood pressure causes heart disease and increases the risk  of stroke. Blood pressure should be checked at least every 1 to 2 years. Ongoing high blood pressure should be treated with medicines, if weight loss and exercise are not effective.  If you are 71 to 78 years old, ask your caregiver if you should take aspirin to prevent strokes.  Diabetes screening involves taking a blood sample to check your fasting blood sugar level. This should be done once every 3 years, after age 3, if you are within normal weight and without risk factors for diabetes. Testing should be considered at a younger age or be carried out more frequently if you are overweight and have at least 1 risk factor for diabetes.  Breast cancer screening is essential preventative care for women. You should practice "breast self-awareness." This means understanding the normal appearance and feel of your breasts and may include breast self-examination. Any changes detected, no matter how small, should be reported to a caregiver. Women in their 63s and 30s should have a clinical breast exam (CBE) by a caregiver as part of a regular health exam every 1 to 3 years. After age 1, women should have a CBE every year. Starting at age 45, women should consider having a mammogram (breast X-ray) every year. Women who have a family history of breast cancer should talk to their caregiver about genetic screening. Women at a high risk of breast cancer should talk to their caregiver about having an MRI and a mammogram every year.  Breast cancer gene (BRCA)-related cancer risk assessment is recommended for women who have family members with BRCA-related cancers. BRCA-related cancers include breast, ovarian, tubal, and peritoneal cancers. Having family members with these cancers may be associated with an increased risk for harmful changes (mutations) in the breast cancer genes BRCA1 and BRCA2. Results of the assessment will determine the need for genetic counseling and BRCA1 and BRCA2 testing.  The Pap test is a  screening test for cervical cancer. Women should have a Pap test starting at age 4. Between ages 34 and 66, Pap tests should be repeated every 2 years. Beginning at age 79, you should have a Pap test every 3 years as long as the past 3 Pap tests have been normal. If you had a hysterectomy for a problem that was not cancer or a condition that could lead to cancer, then you no longer need Pap tests. If you are between ages 50 and 39, and you have had normal Pap tests going back 10 years, you no longer need Pap tests. If you have had past treatment for cervical cancer or a condition that could lead to cancer, you need Pap tests and screening for cancer for at least 20 years after your treatment. If Pap tests have been discontinued, risk factors (such as a new sexual partner) need to be reassessed to determine if screening should be resumed. Some women have medical problems that increase the chance of getting cervical cancer. In these cases, your caregiver may recommend more frequent screening and Pap tests.  The human papillomavirus (HPV) test is  an additional test that may be used for cervical cancer screening. The HPV test looks for the virus that can cause the cell changes on the cervix. The cells collected during the Pap test can be tested for HPV. The HPV test could be used to screen women aged 89 years and older, and should be used in women of any age who have unclear Pap test results. After the age of 31, women should have HPV testing at the same frequency as a Pap test.  Colorectal cancer can be detected and often prevented. Most routine colorectal cancer screening begins at the age of 32 and continues through age 35. However, your caregiver may recommend screening at an earlier age if you have risk factors for colon cancer. On a yearly basis, your caregiver may provide home test kits to check for hidden blood in the stool. Use of a small camera at the end of a tube, to directly examine the colon  (sigmoidoscopy or colonoscopy), can detect the earliest forms of colorectal cancer. Talk to your caregiver about this at age 77, when routine screening begins. Direct examination of the colon should be repeated every 5 to 10 years through age 52, unless early forms of pre-cancerous polyps or small growths are found.  Hepatitis C blood testing is recommended for all people born from [birth year] through 1963 and any individual with known risks for hepatitis C.  Practice safe sex. Use condoms and avoid high-risk sexual practices to reduce the spread of sexually transmitted infections (STIs). Sexually active women aged 41 and younger should be checked for Chlamydia, which is a common sexually transmitted infection. Older women with new or multiple partners should also be tested for Chlamydia. Testing for other STIs is recommended if you are sexually active and at increased risk.  Osteoporosis is a disease in which the bones lose minerals and strength with aging. This can result in serious bone fractures. The risk of osteoporosis can be identified using a bone density scan. Women ages 55 and over and women at risk for fractures or osteoporosis should discuss screening with their caregivers. Ask your caregiver whether you should be taking a calcium supplement or vitamin D to reduce the rate of osteoporosis.  Menopause can be associated with physical symptoms and risks. Hormone replacement therapy is available to decrease symptoms and risks. You should talk to your caregiver about whether hormone replacement therapy is right for you.  Use sunscreen. Apply sunscreen liberally and repeatedly throughout the day. You should seek shade when your shadow is shorter than you. Protect yourself by wearing long sleeves, pants, a wide-brimmed hat, and sunglasses year round, whenever you are outdoors.  Notify your caregiver of new moles or changes in moles, especially if there is a change in shape or color. Also notify your  caregiver if a mole is larger than the size of a pencil eraser.  Stay current with your immunizations. Document Released: 01/15/2011 Document Revised: 10/27/2012 Document Reviewed: 01/15/2011 [facility] Patient Information 2014 [place].

## 2014-03-04 LAB — CYTOLOGY - PAP

## 2014-03-09 ENCOUNTER — Ambulatory Visit (INDEPENDENT_AMBULATORY_CARE_PROVIDER_SITE_OTHER): Payer: Medicare Other | Admitting: Cardiology

## 2014-03-09 ENCOUNTER — Encounter: Payer: Self-pay | Admitting: Cardiology

## 2014-03-09 VITALS — BP 196/88 | HR 87 | Ht 66.0 in | Wt 204.0 lb

## 2014-03-09 DIAGNOSIS — E7800 Pure hypercholesterolemia, unspecified: Secondary | ICD-10-CM

## 2014-03-09 DIAGNOSIS — R609 Edema, unspecified: Secondary | ICD-10-CM

## 2014-03-09 DIAGNOSIS — I2510 Atherosclerotic heart disease of native coronary artery without angina pectoris: Secondary | ICD-10-CM

## 2014-03-09 DIAGNOSIS — I10 Essential (primary) hypertension: Secondary | ICD-10-CM

## 2014-03-09 DIAGNOSIS — R252 Cramp and spasm: Secondary | ICD-10-CM

## 2014-03-09 DIAGNOSIS — I25810 Atherosclerosis of coronary artery bypass graft(s) without angina pectoris: Secondary | ICD-10-CM | POA: Diagnosis not present

## 2014-03-09 NOTE — Progress Notes (Signed)
03/10/2014   PCP: Horatio Pel, MD   Chief Complaint  Patient presents with  . Follow-up    medication adjustment for HTN    Primary Cardiologist:Dr. Adora Fridge   HPI:  80 year old, mildly overweight, widowed [place] American female, mother of 2, grandmother to 1 grandchild I saw recently- Tara Potts is here for follow up. Tara Potts has a history of CAD status post coronary artery bypass grafting November 2007 at [facility]. Tara Potts other problems include hypertension, hyperlipidemia and mild carotid disease. Tara Potts had a Myoview stress test performed January 12, 2011, which was nonischemic. Carotid Dopplers performed August 27, 2011, showed moderate right and mild left ICA stenosis - dopplers scheduled to be redone in February of next year. Dr. Adora Fridge last cath'd Tara Potts January 09, 2008, revealing diffusely diseased graft to the PDA as well as diffuse PDA disease and it was elected to treat Tara Potts medically at that time. Tara Potts major complaints are nocturnal leg cramps.   Because of the leg cramps Tara Potts had stopped taking most of Tara Potts medicine and Tara Potts blames it on the medication. Tara Potts has not taken Tara Potts Cozaar nor Tara Potts Lopressor. Tara Potts stopped taking Crestor 2 months ago. Tara Potts also had edema of the feet.  We discussed the importance of Tara Potts blood pressure medication..  We added HCTZ to Tara Potts meds.  Tara Potts was instructed to take flexeril at night if needed.  Tara Potts labs were stable, Mg, Na, K+.  Today Tara Potts is here for follow up.   Tara Potts BP today is elevated but Tara Potts has not had meds today.  Yesterday with GYN appt Tara Potts BP [id number] systolic, Tara Potts had taken Tara Potts meds.  Tara Potts still continues with mild swelling of Tara Potts feet.  Tara Potts leg cramps are somewhat better.  Now Tara Potts discusses more pain in Tara Potts legs, burning on bottom of feet and in legs at times.  Tara Potts also complains of leg weakness.  Feeling tired.  Near the end of the visit Tara Potts also related Tara Potts daughter's wedding is at Tara Potts house September 1 Tara Potts is very concerned about Tara Potts health  but would like to wait after September 1 for  any tests.  Tara Potts had normal lower ext arterial dopplers done in 2012 and Tara Potts symptoms began 2 years ago.         No Known Allergies  Current Outpatient Prescriptions  Medication Sig Dispense Refill  . Ascorbic Acid (VITAMIN C PO) Take by mouth.        [name] aspirin 325 MG tablet Take 325 mg by mouth daily.        [name] CALCIUM PO Take by mouth.        . co-enzyme Q-10 30 MG capsule Take 30 mg by mouth 3 (three) times daily.      . cyclobenzaprine (FLEXERIL) 10 MG tablet Take 10 mg by mouth at bedtime as needed.      [name] estradiol-norethindrone (COMBIPATCH) 0.05-0.14 MG/DAY Place 1 patch onto the skin 2 (two) times a week. PLACE 1 PATCH ONTO SKIN 2 TIMES A Month  8 patch  6  . fish oil-omega-3 fatty acids 1000 MG capsule Take 2 g by mouth daily.        [name] glucosamine-chondroitin 500-400 MG tablet Take 2 tablets by mouth 2 (two) times daily.      [name] losartan-hydrochlorothiazide (HYZAAR) 50-12.5 MG per tablet Take 1 tablet by mouth daily.  30 tablet  6  . metoprolol (LOPRESSOR) 50 MG tablet TAKE 1 TABLET BY  MOUTH TWICE DAILY  60 tablet  5  . Multiple Vitamin (MULTIVITAMIN) tablet Take 1 tablet by mouth daily.        . nitroGLYCERIN (NITROLINGUAL) 0.4 MG/SPRAY spray Place 1 spray under the tongue every 5 (five) minutes as needed for chest pain.  12 g  1  . potassium chloride (K-DUR) 10 MEQ tablet Take 1 tablet (10 mEq total) by mouth daily.  30 tablet  6   No current facility-administered medications for this visit.    Past Medical History  Diagnosis Date  . Fibroid   . Menopausal symptoms   . CAD (coronary artery disease)   . Hyperlipemia   . Hypertension   . Carotid disease, bilateral     mild  . Hx of CABG 2007    [facility]    Past Surgical History  Procedure Laterality Date  . Mass removed from cervix  12/1997    Benign endocervical inclusion cysts  . Foot surgery    . Triple heart bypass  05/2006    [facility]    . Nm myoview ltd  01/12/2011    no ischemia  . Carotid doppler  08/27/2011    mod. right & nild left ICA stenosis  . Cardiac catheterization  01/09/2008    diffusely diseased graft to the PDA & diffuse PDA disease  . US echocardiography  10/30/2007    mild MA,MR,TR,AOV mildly sclerotic,density oted in the prox asc aorta  . Coronary artery bypass graft      [id number] colds or fevers, no weight changes Skin:no rashes or ulcers HEENT:no blurred vision, no congestion CV:see HPI PUL:see HPI GI:no diarrhea constipation or melena, no indigestion GU:no hematuria, no dysuria MS:no joint pain, no claudication, + neuropathy type pain, and leg cramps Neuro:no syncope, no lightheadedness Endo:no diabetes, no thyroid disease  Wt Readings from Last 3 Encounters:  03/09/14 204 lb (92.534 kg)  03/03/14 201 lb (91.173 kg)  02/17/14 203 lb 3.2 oz (92.171 kg)    PHYSICAL EXAM BP 196/88  Pulse 87  Ht 5\' 6"  (1.676 m)  Wt 204 lb (92.534 kg)  BMI 32.94 kg/m2  LMP 07/16/2002 General:Pleasant affect, NAD Skin:Warm and dry, brisk capillary refill HEENT:normocephalic, sclera clear, mucus membranes moist Neck:supple, no JVD, no bruits  Heart:S1S2 RRR without murmur, gallup, rub or click Lungs:clear without rales, rhonchi, or wheezes [id number], non tender, + BS, do not palpate liver spleen or masses Ext:+ lower ext edema in Tara Potts feet, 2+ pedal pulses, 2+ radial pulses Neuro:alert and oriented X 3, MAE, follows commands, + facial symmetry   ASSESSMENT AND PLAN Coronary artery disease  Bypass grafting in 2007 last stress test June of 2012 without ischemia. Last cardiac cath 2009 with diffusely diseased graft to the PDA as well as diffuse PDA disease treated medically.  With edema increased fatigue leg fatigue we will proceed with a plexus and Myoview as well as a 2-D echo to further evaluate Tara Potts cardiac issues.  Tara Potts will then follow with Dr. Gwenlyn Found  Bilateral leg cramps Doing better Tara Potts  electrolytes were normal on lab work  Essential hypertension Today a significant elevation but Tara Potts did not take Tara Potts blood pressure medications today.  Yesterday on medications Tara Potts was [id number] systolic. Encouraged to take Tara Potts medicine daily  Elevated cholesterol Holding Crestor for now per Tara Potts primary care

## 2014-03-09 NOTE — Patient Instructions (Signed)
Your physician recommends that you schedule a follow-up appointment in:   After testing  We are ordering a stress test  We are ordering an Echo

## 2014-03-10 NOTE — Assessment & Plan Note (Signed)
Holding Crestor for now per her primary care

## 2014-03-10 NOTE — Assessment & Plan Note (Signed)
Bypass grafting in 2007 last stress test June of 2012 without ischemia. Last cardiac cath 2009 with diffusely diseased graft to the PDA as well as diffuse PDA disease treated medically.  With edema increased fatigue leg fatigue we will proceed with a plexus and Myoview as well as a 2-D echo to further evaluate her cardiac issues.  She will then follow with Dr. Gwenlyn Found

## 2014-03-10 NOTE — Assessment & Plan Note (Signed)
Today a significant elevation but she did not take her blood pressure medications today.  Yesterday on medications she was [id number] systolic. Encouraged to take her medicine daily

## 2014-03-10 NOTE — Assessment & Plan Note (Signed)
Doing better her electrolytes were normal on lab work

## 2014-03-12 ENCOUNTER — Telehealth (HOSPITAL_COMMUNITY): Payer: Self-pay | Admitting: *Deleted

## 2014-03-23 ENCOUNTER — Encounter: Payer: Self-pay | Admitting: Gynecology

## 2014-03-23 ENCOUNTER — Ambulatory Visit (INDEPENDENT_AMBULATORY_CARE_PROVIDER_SITE_OTHER): Payer: Medicare Other | Admitting: Gynecology

## 2014-03-23 DIAGNOSIS — N888 Other specified noninflammatory disorders of cervix uteri: Secondary | ICD-10-CM | POA: Diagnosis not present

## 2014-03-23 DIAGNOSIS — N841 Polyp of cervix uteri: Secondary | ICD-10-CM | POA: Diagnosis not present

## 2014-03-23 NOTE — Addendum Note (Signed)
Addended by: Alen Blew on: 03/23/2014 12:29 PM   Modules accepted: Orders

## 2014-03-23 NOTE — Patient Instructions (Signed)
Office will call with the biopsy results

## 2014-03-23 NOTE — Progress Notes (Signed)
Tara Potts 1933-11-06 [id number]        80 y.o.  G1P1001 presents for colposcopy due to lesion noted on cervix at recent annual exam.  Pap smear done at that time negative  Past medical history,surgical history, problem list, medications, allergies, family history and social history were all reviewed and documented in the EPIC chart.  Directed ROS with pertinent positives and negatives documented in the history of present illness/assessment and plan.  Exam: Tara Potts assistant General appearance:  Normal Ext/BUS/Vag with atrophic changes.  Cervix with cystic mass at external os  Colposcopy after acetic acid cleanse inadequate without TZ visualized, with large nabothian cyst posterior lip.  Biopsy drained mucoid material.  Sample sent to pathology Physical Exam  Genitourinary:       Assessment/Plan:  80 y.o. [id number] with nabothian cyst, now drained.  Follow up for biopsy results, otherwise annually.   Note: This document was prepared with digital dictation and possible smart phrase technology. Any transcriptional errors that result from this process are unintentional.   Tara Auerbach MD, 11:50 AM 03/23/2014

## 2014-03-26 NOTE — Progress Notes (Signed)
LEFT MESSAGE FOR PT TO CALL.

## 2014-03-30 DIAGNOSIS — E789 Disorder of lipoprotein metabolism, unspecified: Secondary | ICD-10-CM | POA: Diagnosis not present

## 2014-03-30 DIAGNOSIS — N390 Urinary tract infection, site not specified: Secondary | ICD-10-CM | POA: Diagnosis not present

## 2014-03-30 DIAGNOSIS — I10 Essential (primary) hypertension: Secondary | ICD-10-CM | POA: Diagnosis not present

## 2014-03-30 DIAGNOSIS — E7800 Pure hypercholesterolemia, unspecified: Secondary | ICD-10-CM | POA: Diagnosis not present

## 2014-03-31 DIAGNOSIS — I2510 Atherosclerotic heart disease of native coronary artery without angina pectoris: Secondary | ICD-10-CM | POA: Diagnosis not present

## 2014-03-31 DIAGNOSIS — E7800 Pure hypercholesterolemia, unspecified: Secondary | ICD-10-CM | POA: Diagnosis not present

## 2014-03-31 DIAGNOSIS — E789 Disorder of lipoprotein metabolism, unspecified: Secondary | ICD-10-CM | POA: Diagnosis not present

## 2014-03-31 DIAGNOSIS — R609 Edema, unspecified: Secondary | ICD-10-CM | POA: Diagnosis not present

## 2014-04-05 DIAGNOSIS — R609 Edema, unspecified: Secondary | ICD-10-CM | POA: Diagnosis not present

## 2014-04-05 DIAGNOSIS — I10 Essential (primary) hypertension: Secondary | ICD-10-CM | POA: Diagnosis not present

## 2014-04-08 ENCOUNTER — Other Ambulatory Visit: Payer: Self-pay | Admitting: Gynecology

## 2014-04-08 DIAGNOSIS — R609 Edema, unspecified: Secondary | ICD-10-CM | POA: Diagnosis not present

## 2014-04-08 DIAGNOSIS — M47817 Spondylosis without myelopathy or radiculopathy, lumbosacral region: Secondary | ICD-10-CM | POA: Diagnosis not present

## 2014-04-08 DIAGNOSIS — M545 Low back pain, unspecified: Secondary | ICD-10-CM | POA: Diagnosis not present

## 2014-04-08 DIAGNOSIS — I10 Essential (primary) hypertension: Secondary | ICD-10-CM | POA: Diagnosis not present

## 2014-04-08 DIAGNOSIS — I2510 Atherosclerotic heart disease of native coronary artery without angina pectoris: Secondary | ICD-10-CM | POA: Diagnosis not present

## 2014-04-08 DIAGNOSIS — E789 Disorder of lipoprotein metabolism, unspecified: Secondary | ICD-10-CM | POA: Diagnosis not present

## 2014-05-12 DIAGNOSIS — M62830 Muscle spasm of back: Secondary | ICD-10-CM | POA: Diagnosis not present

## 2014-05-12 DIAGNOSIS — M9903 Segmental and somatic dysfunction of lumbar region: Secondary | ICD-10-CM | POA: Diagnosis not present

## 2014-05-12 DIAGNOSIS — M545 Low back pain: Secondary | ICD-10-CM | POA: Diagnosis not present

## 2014-05-13 DIAGNOSIS — M62830 Muscle spasm of back: Secondary | ICD-10-CM | POA: Diagnosis not present

## 2014-05-13 DIAGNOSIS — M9903 Segmental and somatic dysfunction of lumbar region: Secondary | ICD-10-CM | POA: Diagnosis not present

## 2014-05-13 DIAGNOSIS — M545 Low back pain: Secondary | ICD-10-CM | POA: Diagnosis not present

## 2014-05-17 ENCOUNTER — Encounter: Payer: Self-pay | Admitting: Gynecology

## 2014-07-05 ENCOUNTER — Encounter: Payer: Self-pay | Admitting: Podiatry

## 2014-07-05 ENCOUNTER — Ambulatory Visit (INDEPENDENT_AMBULATORY_CARE_PROVIDER_SITE_OTHER): Payer: Medicare Other | Admitting: Podiatry

## 2014-07-05 ENCOUNTER — Ambulatory Visit (INDEPENDENT_AMBULATORY_CARE_PROVIDER_SITE_OTHER): Payer: Medicare Other

## 2014-07-05 VITALS — Ht 66.5 in | Wt 200.0 lb

## 2014-07-05 DIAGNOSIS — M2010 Hallux valgus (acquired), unspecified foot: Secondary | ICD-10-CM

## 2014-07-05 DIAGNOSIS — I739 Peripheral vascular disease, unspecified: Secondary | ICD-10-CM

## 2014-07-05 DIAGNOSIS — L84 Corns and callosities: Secondary | ICD-10-CM | POA: Diagnosis not present

## 2014-07-05 DIAGNOSIS — M2040 Other hammer toe(s) (acquired), unspecified foot: Secondary | ICD-10-CM

## 2014-07-05 DIAGNOSIS — I2510 Atherosclerotic heart disease of native coronary artery without angina pectoris: Secondary | ICD-10-CM

## 2014-07-05 NOTE — Progress Notes (Signed)
Subjective:     Patient ID: Tara Potts, female   DOB: [birth year], 80 y.o.   MRN: [id number]  HPI patient presents stating she's getting a lot of pain in her toes she has corns and calluses she has structural bunions and hammertoes and it seems like it's getting worse in her toes as time goes on   Review of Systems  All other systems reviewed and are negative.      Objective:   Physical Exam Neurovascular status is found to be significantly diminished with nonpalpable PT and DP pulses and patient does have mild neurological loss. Muscle strength is adequate and range of motion is within normal limits for her age but I did note keratotic lesions second toes distal of both feet in between the second and third toes with no current ulceration noted. Patient has diminished hair growth and coolness to her digits at the current time    Assessment:     Possibility for PVD creating the intense night symptoms she is experiences that she also has cramping. Toe deformity and structural bunions along with corn callus formation which is certainly a part of her pathology    Plan:     I'm going to have her evaluated and have her vascular structure tested and I have requested an appointment from [facility] and today I debrided lesions on both feet and applied pads to take pressure off the toes and we will see what kind of relief this gives her. We will see what the results of her testing shows to decide if there's anything else that can be done to help her

## 2014-07-07 DIAGNOSIS — I739 Peripheral vascular disease, unspecified: Secondary | ICD-10-CM | POA: Diagnosis not present

## 2014-07-07 DIAGNOSIS — G629 Polyneuropathy, unspecified: Secondary | ICD-10-CM | POA: Diagnosis not present

## 2014-07-12 ENCOUNTER — Telehealth (HOSPITAL_COMMUNITY): Payer: Self-pay | Admitting: *Deleted

## 2014-07-14 ENCOUNTER — Ambulatory Visit (HOSPITAL_COMMUNITY)
Admission: RE | Admit: 2014-07-14 | Discharge: 2014-07-14 | Disposition: A | Discharge status: Home / Self Care | Payer: Medicare Other | Source: Ambulatory Visit | Attending: Cardiology | Admitting: Cardiology

## 2014-07-14 DIAGNOSIS — I739 Peripheral vascular disease, unspecified: Secondary | ICD-10-CM

## 2014-07-14 NOTE — Progress Notes (Signed)
Lower Extremity Arterial Duplex Completed. °Brianna L Mazza,RVT °

## 2014-07-15 DIAGNOSIS — H43813 Vitreous degeneration, bilateral: Secondary | ICD-10-CM | POA: Diagnosis not present

## 2014-07-15 DIAGNOSIS — Z961 Presence of intraocular lens: Secondary | ICD-10-CM | POA: Diagnosis not present

## 2014-07-15 DIAGNOSIS — H35373 Puckering of macula, bilateral: Secondary | ICD-10-CM | POA: Diagnosis not present

## 2014-07-15 DIAGNOSIS — H26492 Other secondary cataract, left eye: Secondary | ICD-10-CM | POA: Diagnosis not present

## 2014-07-20 ENCOUNTER — Encounter: Payer: Self-pay | Admitting: Physician Assistant

## 2014-07-20 ENCOUNTER — Ambulatory Visit (INDEPENDENT_AMBULATORY_CARE_PROVIDER_SITE_OTHER): Payer: Medicare Other | Admitting: Physician Assistant

## 2014-07-20 VITALS — BP 160/70 | HR 82 | Ht 66.5 in | Wt 200.3 lb

## 2014-07-20 DIAGNOSIS — I739 Peripheral vascular disease, unspecified: Secondary | ICD-10-CM

## 2014-07-20 DIAGNOSIS — I10 Essential (primary) hypertension: Secondary | ICD-10-CM

## 2014-07-20 DIAGNOSIS — I2583 Coronary atherosclerosis due to lipid rich plaque: Secondary | ICD-10-CM

## 2014-07-20 DIAGNOSIS — I2510 Atherosclerotic heart disease of native coronary artery without angina pectoris: Secondary | ICD-10-CM | POA: Diagnosis not present

## 2014-07-20 DIAGNOSIS — R252 Cramp and spasm: Secondary | ICD-10-CM | POA: Diagnosis not present

## 2014-07-20 MED ORDER — METOPROLOL TARTRATE 50 MG PO TABS: 75.0000 mg | ORAL_TABLET | Freq: Two times a day (BID) | ORAL | Status: DC

## 2014-07-20 MED ORDER — MAGNESIUM OXIDE -MG SUPPLEMENT 200 MG PO TABS: 100.0000 mg | ORAL_TABLET | Freq: Every day | ORAL | Status: DC

## 2014-07-20 NOTE — Assessment & Plan Note (Signed)
No complaints of chest pain

## 2014-07-20 NOTE — Assessment & Plan Note (Signed)
We'll try adding magox 100mg  daily.

## 2014-07-20 NOTE — Assessment & Plan Note (Signed)
Patient just had lower arterial Dopplers which revealed a right ABI of 0.63 in the left of 0.94 most significant velocities were in the mid and distal SFA of 268 and 267 cm/s respectively. She's not quite at the point where she needs a PV angiogram. We'll continue to monitor her symptoms.

## 2014-07-20 NOTE — Assessment & Plan Note (Signed)
Blood pressure is poorly controlled.  She's been complaining that she's going to the bathroom quite frequently so we'll try increasing her metoprolol and set of the Hyzaar.  Follow-up in one month for BP check in 6 months with Dr. Gwenlyn Found.

## 2014-07-20 NOTE — Progress Notes (Signed)
Patient ID: Tara Potts, female   DOB: 09/19/1933, 81 y.o.   MRN: [id number]    Date:  07/20/2014   ID:  Tara Potts, DOB [birth year], MRN [id number]  PCP:  Horatio Pel, MD  Primary Cardiologist:  Gwenlyn Found  Chief Complaint  Patient presents with  . Follow-up    LEA's done 12/30 discomfort in both legs will wake her during the night     History of Present Illness: Tara Potts is a 81 y.o. mildly overweight, widowed [place] American female, mother of 2, grandmother to 1 grandchild.  She has a history of CAD status post coronary artery bypass grafting November 2007 at [facility]. Her other problems include hypertension, hyperlipidemia and mild carotid disease. She had a Myoview stress test performed January 12, 2011, which was nonischemic. Carotid Dopplers performed August 27, 2011, showed moderate right and mild left ICA stenosis - dopplers scheduled to be redone in February of next year. Dr. Adora Fridge last cath'd her January 09, 2008, revealing diffusely diseased graft to the PDA as well as diffuse PDA disease and it was elected to treat her medically at that time.   The patient saw Cecilie Kicks nurse practitioner back in August.  At that time the patient's major complaint was nocturnal leg cramps.  Because of the leg cramps she had stopped taking most of her medicine and she blames it on the medication. She stopped taking Crestor in June.  She added HCTZ to her meds. She was instructed to take flexeril at night if needed. At that time her labs were stable, Mg, Na, K+.  Patient resents for follow-up after having lower extremity arterial Dopplers which revealed a right ABI of 0.63 in the left of 0.94 most significant velocities were in the mid and distal SFA of 268 and 267 cm/s respectively.  Patient reports being awoken at night with tingling and burning sensation in her legs. It is worse on the right. She also reports that she'll get the sensation when she walks across the room.  It is  hard to tell if her symptoms are actually claudication or peripheral neuropathy.  Her blood pressure is elevated today and she says at home and can be elevated as well. Hher last office visit was [id number] systolic.  The patient currently denies nausea, vomiting, fever, chest pain, shortness of breath, orthopnea, dizziness, PND, cough, congestion, abdominal pain, hematochezia, melena, lower extremity edema, .  Wt Readings from Last 3 Encounters:  07/20/14 200 lb 4.8 oz (90.855 kg)  07/05/14 200 lb (90.719 kg)  03/09/14 204 lb (92.534 kg)     Past Medical History  Diagnosis Date  . Fibroid   . Menopausal symptoms   . CAD (coronary artery disease)   . Hyperlipemia   . Hypertension   . Carotid disease, bilateral     mild  . Hx of CABG 2007    [facility]    Current Outpatient Prescriptions  Medication Sig Dispense Refill  . Ascorbic Acid (VITAMIN C PO) Take by mouth.      [name] aspirin 325 MG tablet Take 325 mg by mouth daily.      [name] CALCIUM PO Take by mouth.      . co-enzyme Q-10 30 MG capsule Take 30 mg by mouth 3 (three) times daily.    . COMBIPATCH 0.05-0.14 MG/DAY PLACE 1 PATCH ONTO SKIN 2 TIMES WEEKLY (Patient taking differently: PLACE 1 PATCH ONTO SKIN 2 TIMES WEEKLY PRN) 8 patch 6  . cyclobenzaprine (FLEXERIL)  10 MG tablet Take 10 mg by mouth at bedtime as needed.    . fish oil-omega-3 fatty acids 1000 MG capsule Take 2 g by mouth daily.      [name] glucosamine-chondroitin 500-400 MG tablet Take 2 tablets by mouth 2 (two) times daily.    [name] losartan-hydrochlorothiazide (HYZAAR) 50-12.5 MG per tablet Take 1 tablet by mouth daily. 30 tablet 6  . metoprolol (LOPRESSOR) 50 MG tablet Take 1.5 tablets (75 mg total) by mouth 2 (two) times daily. 90 tablet 5  . Multiple Vitamin (MULTIVITAMIN) tablet Take 1 tablet by mouth daily.      . nitroGLYCERIN (NITROLINGUAL) 0.4 MG/SPRAY spray Place 1 spray under the tongue every 5 (five) minutes as needed for chest pain. 12 g 1  . potassium chloride  (K-DUR) 10 MEQ tablet Take 1 tablet (10 mEq total) by mouth daily. 30 tablet 6  . Magnesium Oxide 200 MG TABS Take 0.5 tablets (100 mg total) by mouth daily. 15 tablet 5   No current facility-administered medications for this visit.    Allergies:   No Known Allergies  Social History:  The patient  reports that she has quit smoking. She has never used smokeless tobacco. She reports that she does not drink alcohol or use illicit drugs.   Family history:   Family History  Problem Relation Age of Onset  . Diabetes Mother   . Hypertension Mother   . Heart disease Mother   . Heart failure Mother   . Hypertension Sister   . Breast cancer Maternal Aunt     Age 75  . Heart failure Maternal Aunt   . Heart failure Maternal Uncle     ROS:  Please see the history of present illness.  All other systems reviewed and negative.   PHYSICAL EXAM: VS:  BP 160/70 mmHg  Pulse 82  Ht 5' 6.5" (1.689 m)  Wt 200 lb 4.8 oz (90.855 kg)  BMI 31.85 kg/m2  LMP 07/16/2002 obese, well developed, in no acute distress HEENT: Pupils are equal round react to light accommodation extraocular movements are intact.  Neck: No cervical lymphadenopathy. Cardiac: Regular rate and rhythm without murmurs rubs or gallops. Lungs:  clear to auscultation bilaterally, no wheezing, rhonchi or rales Ext: no lower extremity edema.  2+ right radial 1+ left and 1+ right DP PT and 2+ left dorsalis pedis pulses. Skin: warm and dry Neuro:  Grossly normal    ASSESSMENT AND PLAN:  Problem List Items Addressed This Visit    Peripheral vascular disease:  Moderate right and mild left ICA stenosis.    Patient just had lower arterial Dopplers which revealed a right ABI of 0.63 in the left of 0.94 most significant velocities were in the mid and distal SFA of 268 and 267 cm/s respectively. She's not quite at the point where she needs a PV angiogram. We'll continue to monitor her symptoms.    Relevant Medications      metoprolol  (LOPRESSOR) tablet   Essential hypertension    Blood pressure is poorly controlled.  She's been complaining that she's going to the bathroom quite frequently so we'll try increasing her metoprolol and set of the Hyzaar.  Follow-up in one month for BP check in 6 months with Dr. Gwenlyn Found.    Relevant Medications      metoprolol (LOPRESSOR) tablet   Coronary artery disease    No complaints of chest pain    Relevant Medications      metoprolol (LOPRESSOR) tablet  Bilateral leg cramps - Primary    We'll try adding magox 100mg  daily.

## 2014-07-20 NOTE — Patient Instructions (Signed)
Your physician recommends that you schedule a follow-up appointment in: 1 month with Erasmo Downer for a blood pressure check  Your physician wants you to follow-up in: 6 months with Dr.Berry. You will receive a reminder letter in the mail two months in advance. If you don't receive a letter, please call our office to schedule the follow-up appointment.  INCREASE your Metoprolol to 75 mg (1.5tabs) Twice daily.  START Magnesium Oxide 100mg  Daily  Remember to be mindful of a low sodium diet.

## 2014-07-27 ENCOUNTER — Encounter: Payer: Self-pay | Admitting: Neurology

## 2014-07-27 ENCOUNTER — Ambulatory Visit (INDEPENDENT_AMBULATORY_CARE_PROVIDER_SITE_OTHER): Payer: Medicare Other | Admitting: Neurology

## 2014-07-27 VITALS — BP 156/67 | HR 72 | Ht 66.0 in | Wt 202.0 lb

## 2014-07-27 DIAGNOSIS — R269 Unspecified abnormalities of gait and mobility: Secondary | ICD-10-CM

## 2014-07-27 DIAGNOSIS — I2510 Atherosclerotic heart disease of native coronary artery without angina pectoris: Secondary | ICD-10-CM | POA: Diagnosis not present

## 2014-07-27 DIAGNOSIS — R202 Paresthesia of skin: Secondary | ICD-10-CM | POA: Diagnosis not present

## 2014-07-27 NOTE — Progress Notes (Signed)
PATIENT: Tara Potts DOB: 09-02-1933  HISTORICAL  Tara Potts is a 81 years old right-handed African-American female, referred by her primary care physician Dr. [name] for evaluation of intermittent bilateral leg cramping, paresthesia,  She had past medical history of coronary artery disease, hypertension, hyperlipidemia,  Since 2014, she noticed mild gait difficulty, intermittent low back pain, frequent nocturia,, in addition, she noticed bilateral feet paresthesia, calf muscle cramping, especially at nighttime, gradually getting worse over the past 1 year, now she woke up every 2-3 hours, because bilateral feet swollen sensation, numbness tingling, and also bilateral calf muscle cramping, sometimes muscle cramping involving whole leg, she has to work it out  She also complains of intermittent finger cramping, paresthesia, she denies shooting pain from back to her lower extremity, intermittent groin pain, neck pain, she has no bowel and bladder incontinence,  Her symptoms are most obvious during night time, not bothersome during daytime, but she does has mild gait difficulty  Laboratory evaluation from primary care office September 2015, normal CMP, CBC, LDL was elevated 280.  She was given Neurontin 300 mg," it has made me crazy", she could not tolerate the medications  REVIEW OF SYSTEMS: Full 14 system review of systems performed and notable only for joints pain, cramps, aching muscles  ALLERGIES: No Known Allergies  HOME MEDICATIONS: Current Outpatient Prescriptions on File Prior to Visit  Medication Sig Dispense Refill  . Ascorbic Acid (VITAMIN C PO) Take by mouth.      [name] aspirin 325 MG tablet Take 325 mg by mouth daily.      [name] CALCIUM PO Take by mouth.      . co-enzyme Q-10 30 MG capsule Take 30 mg by mouth 3 (three) times daily.    . COMBIPATCH 0.05-0.14 MG/DAY PLACE 1 PATCH ONTO SKIN 2 TIMES WEEKLY (Patient taking differently: PLACE 1 PATCH ONTO SKIN 2 TIMES  WEEKLY PRN) 8 patch 6  . cyclobenzaprine (FLEXERIL) 10 MG tablet Take 10 mg by mouth at bedtime as needed.    . fish oil-omega-3 fatty acids 1000 MG capsule Take 2 g by mouth daily.      [name] glucosamine-chondroitin 500-400 MG tablet Take 2 tablets by mouth 2 (two) times daily.    [name] losartan-hydrochlorothiazide (HYZAAR) 50-12.5 MG per tablet Take 1 tablet by mouth daily. 30 tablet 6  . Magnesium Oxide 200 MG TABS Take 0.5 tablets (100 mg total) by mouth daily. 15 tablet 5  . metoprolol (LOPRESSOR) 50 MG tablet Take 1.5 tablets (75 mg total) by mouth 2 (two) times daily. 90 tablet 5  . Multiple Vitamin (MULTIVITAMIN) tablet Take 1 tablet by mouth daily.      . nitroGLYCERIN (NITROLINGUAL) 0.4 MG/SPRAY spray Place 1 spray under the tongue every 5 (five) minutes as needed for chest pain. 12 g 1  . potassium chloride (K-DUR) 10 MEQ tablet Take 1 tablet (10 mEq total) by mouth daily. 30 tablet 6   No current facility-administered medications on file prior to visit.    PAST MEDICAL HISTORY: Past Medical History  Diagnosis Date  . Fibroid   . Menopausal symptoms   . CAD (coronary artery disease)   . Hyperlipemia   . Hypertension   . Carotid disease, bilateral     mild  . Hx of CABG 2007    [facility]    PAST SURGICAL HISTORY: Past Surgical History  Procedure Laterality Date  . Mass removed from cervix  12/1997    Benign endocervical inclusion  cysts  . Foot surgery    . Triple heart bypass  05/2006    [facility] [facility]    . Nm myoview ltd  01/12/2011    no ischemia  . Carotid doppler  08/27/2011    mod. right & nild left ICA stenosis  . Cardiac catheterization  01/09/2008    diffusely diseased graft to the PDA & diffuse PDA disease  . US echocardiography  10/30/2007    mild MA,MR,TR,AOV mildly sclerotic,density oted in the prox asc aorta  . Coronary artery bypass graft      FAMILY HISTORY: Family History  Problem Relation Age of Onset  . Diabetes Mother     . Hypertension Mother   . Heart disease Mother   . Heart failure Mother   . Hypertension Sister   . Breast cancer Maternal Aunt     Age 89  . Heart failure Maternal Aunt   . Heart failure Maternal Uncle     SOCIAL HISTORY:  History   Social History  . Marital Status: Widowed    Spouse Name: N/A    Number of Children: 1  . Years of Education: college   Occupational History  . Not on file.   Social History Main Topics  . Smoking status: Former Research scientist (life sciences)  . Smokeless tobacco: Never Used     Comment: Quit 30 years ago  . Alcohol Use: No  . Drug Use: No  . Sexual Activity: No   Other Topics Concern  . Not on file   Social History Narrative   Patient lives at home and her daughter and son in law live with her. Widowed.   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   Education college.   Left handed.   Caffeine       PHYSICAL EXAM   Filed Vitals:   07/27/14 0837  BP: 156/67  Pulse: 72  Height: 5\' 6"  (1.676 m)  Weight: 202 lb (91.627 kg)    Not recorded      Body mass index is 32.62 kg/(m^2).   Generalized: In no acute distress  Neck: Supple, no carotid bruits   Cardiac: Regular rate rhythm  Pulmonary: Clear to auscultation bilaterally  Musculoskeletal: No deformity  Neurological examination  Mentation: Alert oriented to time, place, history taking, and causual conversation  Cranial nerve II-XII: Pupils were equal round reactive to light. Extraocular movements were full.  Visual field were full on confrontational test. Bilateral fundi were sharp.  Facial sensation and strength were normal. Hearing was intact to finger rubbing bilaterally. Uvula tongue midline.  Head turning and shoulder shrug and were normal and symmetric.Tongue protrusion into cheek strength was normal.  Motor: Normal tone, bulk and strength.  Sensory: Intact to fine touch, pinprick, preserved vibratory sensation, and proprioception at toes.  Coordination: Normal finger to nose,  heel-to-shin bilaterally there was no truncal ataxia  Gait: Rising up from seated position by pushing on chair arm, cautious, wide-based, stiff. She has difficulty perform heel walking,  Romberg signs: Negative  Deep tendon reflexes: Brachioradialis 2/2, biceps 2/2, triceps 2/2, patellar 3/3, Achilles 2/2, plantar responses were extensor  bilaterally.   DIAGNOSTIC DATA (LABS, IMAGING, TESTING) - I reviewed patient records, labs, notes, testing and imaging myself where available.  No results found for: WBC, HGB, HCT, MCV, PLT    Component Value Date/Time   NA 137 02/17/2014 1046   K 4.4 02/17/2014 1046   CL 104 02/17/2014 1046   CO2 25 02/17/2014 1046  GLUCOSE 81 02/17/2014 1046   BUN 19 02/17/2014 1046   CREATININE 1.07 02/17/2014 1046   CALCIUM 9.2 02/17/2014 1046    ASSESSMENT AND PLAN  Tara Potts is a 81 y.o. female complains of more than 1 year history of frequent bilateral feet paresthesia, calf muscle cramping frequent nocturia, on examination, she has bilateral upper and lower extremity hyperreflexia, stiff cautious gait, difficulty walking on heels,  1, differentiation diagnosis including cervical spondylitic myelopathy, with superimposed lumbar radiculopathy. 2. MRI of cervical, lumbar spine, EMG nerve conduction study 3. Return to clinic in one month, may also consider laboratory evaluation, TSH,  Marcial Pacas, M.D. Ph.D.  [facility] [address], [place], [place] [address] [phone]

## 2014-08-03 ENCOUNTER — Ambulatory Visit (INDEPENDENT_AMBULATORY_CARE_PROVIDER_SITE_OTHER): Payer: Medicare Other | Admitting: Neurology

## 2014-08-03 ENCOUNTER — Encounter (INDEPENDENT_AMBULATORY_CARE_PROVIDER_SITE_OTHER): Payer: Self-pay | Admitting: Neurology

## 2014-08-03 DIAGNOSIS — R269 Unspecified abnormalities of gait and mobility: Secondary | ICD-10-CM | POA: Diagnosis not present

## 2014-08-03 DIAGNOSIS — R202 Paresthesia of skin: Secondary | ICD-10-CM

## 2014-08-03 DIAGNOSIS — Z0289 Encounter for other administrative examinations: Secondary | ICD-10-CM

## 2014-08-03 NOTE — Procedures (Signed)
   NCS (NERVE CONDUCTION STUDY) WITH EMG (ELECTROMYOGRAPHY) REPORT  STUDY DATE: August 03 2014 PATIENT NAME: Tara Potts DOB: [birth year] MRN: [id number]    TECHNOLOGIST: Towana Badger ELECTROMYOGRAPHER: Marcial Pacas M.D.  CLINICAL INFORMATION:   81 year old female complains few months history of low back pain, bilateral lower chunky and feet muscle cramping  FINDINGS: NERVE CONDUCTION STUDY: Right sural sensory response showed severely decreased snap amplitude, left sural sensory response was absent. Bilateral sural, peroneal to EDB motor responses showed mildly decreased C map amplitude, with normal conduction velocity. Left ulnar sensory and motor responses were normal. Left median sensory response was absent, left median motor responses showed slight prolonged distal latency with preserved snap amplitude, conduction velocity.   NEEDLE ELECTROMYOGRAPHY:  Selected needle examination was performed at bilateral lower extremity muscles, bilateral lumbosacral paraspinal muscles.  There was increased insertion activity, no spontaneous activity, mild enlarged motor unit potential, mild decreased recruitment patterns at bilateral tibialis anterior, tibialis posterior, vastus lateralis,  Increased insertion activity at bilateral lumbosacral paraspinal muscles, L4-5 S1, but there was no spontaneous activity  IMPRESSION:   This is a mild abnormal study. There is electrodiagnostic evidence of mild length dependent axonal peripheral neuropathy, in addition, there was evidence of chronic bilateral lumbosacral radiculopathies. Also evidence of moderate left carpal tunnel syndromes.  INTERPRETING PHYSICIAN:   Marcial Pacas M.D. Ph.D. [facility] [address] [place], [place] [address] [phone]

## 2014-08-05 DIAGNOSIS — R5381 Other malaise: Secondary | ICD-10-CM | POA: Diagnosis not present

## 2014-08-05 DIAGNOSIS — I2510 Atherosclerotic heart disease of native coronary artery without angina pectoris: Secondary | ICD-10-CM | POA: Diagnosis not present

## 2014-08-11 ENCOUNTER — Other Ambulatory Visit: Payer: Medicare Other

## 2014-08-11 ENCOUNTER — Inpatient Hospital Stay: Admission: RE | Admit: 2014-08-11 | Payer: Medicare Other | Source: Ambulatory Visit

## 2014-08-20 ENCOUNTER — Telehealth: Payer: Self-pay | Admitting: *Deleted

## 2014-08-20 ENCOUNTER — Ambulatory Visit (INDEPENDENT_AMBULATORY_CARE_PROVIDER_SITE_OTHER): Payer: Medicare Other | Admitting: Pharmacist Clinician (PhC)/ Clinical Pharmacy Specialist

## 2014-08-20 VITALS — BP 126/56 | HR 60 | Ht 66.0 in | Wt 199.4 lb

## 2014-08-20 DIAGNOSIS — I10 Essential (primary) hypertension: Secondary | ICD-10-CM

## 2014-08-20 DIAGNOSIS — I2510 Atherosclerotic heart disease of native coronary artery without angina pectoris: Secondary | ICD-10-CM

## 2014-08-20 NOTE — Telephone Encounter (Signed)
Collected at [facility] on 04/08/14: BUN 37 Creatinine 1.8 Normal CBC Glucose 116 LDL 280 LDL/HDL Ratio 4.6

## 2014-08-20 NOTE — Patient Instructions (Signed)
  Your blood pressure today is excellent at 126/56  (goal is <150/90)  Take your BP meds as follows: continue with losartan hctz and metoprolol  Exercise as you're able, try to walk approximately 30 minutes per day.  Keep salt intake to a minimum, especially watch canned and prepared boxed foods.  Eat more fresh fruits and vegetables and fewer canned items.  Avoid eating in fast food restaurants.

## 2014-08-22 ENCOUNTER — Encounter: Payer: Self-pay | Admitting: Pharmacist Clinician (PhC)/ Clinical Pharmacy Specialist

## 2014-08-22 NOTE — Progress Notes (Signed)
     08/22/2014 Tara Potts February 23, 1934 [id number]   HPI:  Tara Potts is a 81 y.o. female patient of Dr Gwenlyn Found, with a PMH below who presents today for hypertension clinic evaluation.  Cardiac Hx: CAD, CABG, HTN, hyperlipidemia  Family Hx: mom HTN, DM, CHF, died of MI in her 56s; maternal aunt and uncle both with CHF; sister - HTN   Social Hx: no alcohol, quit smoking several years ago  Diet: low caffeine, does use salt regularly, eats good amount of vegetables; big on vitamin supplements  Exercise: has not done any for awhile b/c of leg cramps - thinks b/c of rosuvastatin  Home BP readings: none - states has several old meters, but they "don't work right and confuse me"  Current antihypertensive medications: losartan hct 50/12.5 qd, metoprolol 75 mg bid (increased on 07/20/14)   Current Outpatient Prescriptions  Medication Sig Dispense Refill  . rosuvastatin (CRESTOR) 20 MG tablet Take 20 mg by mouth daily.    . Ascorbic Acid (VITAMIN C PO) Take by mouth.      [name] aspirin 325 MG tablet Take 325 mg by mouth daily.      [name] CALCIUM PO Take by mouth.      . co-enzyme Q-10 30 MG capsule Take 30 mg by mouth 3 (three) times daily.    . COMBIPATCH 0.05-0.14 MG/DAY PLACE 1 PATCH ONTO SKIN 2 TIMES WEEKLY (Patient taking differently: PLACE 1 PATCH ONTO SKIN 2 TIMES WEEKLY PRN) 8 patch 6  . cyclobenzaprine (FLEXERIL) 10 MG tablet Take 10 mg by mouth at bedtime as needed.    . fish oil-omega-3 fatty acids 1000 MG capsule Take 2 g by mouth daily.      . furosemide (LASIX) 40 MG tablet 40 mg daily.  6  . glucosamine-chondroitin 500-400 MG tablet Take 2 tablets by mouth 2 (two) times daily.    [name] losartan-hydrochlorothiazide (HYZAAR) 50-12.5 MG per tablet Take 1 tablet by mouth daily. 30 tablet 6  . Magnesium Oxide 200 MG TABS Take 0.5 tablets (100 mg total) by mouth daily. 15 tablet 5  . metoprolol (LOPRESSOR) 50 MG tablet Take 1.5 tablets (75 mg total) by mouth 2 (two) times daily. 90 tablet 5    . Multiple Vitamin (MULTIVITAMIN) tablet Take 1 tablet by mouth daily.      . nitroGLYCERIN (NITROLINGUAL) 0.4 MG/SPRAY spray Place 1 spray under the tongue every 5 (five) minutes as needed for chest pain. 12 g 1  . potassium chloride (K-DUR) 10 MEQ tablet Take 1 tablet (10 mEq total) by mouth daily. 30 tablet 6   No current facility-administered medications for this visit.    No Known Allergies  Past Medical History  Diagnosis Date  . Fibroid   . Menopausal symptoms   . CAD (coronary artery disease)   . Hyperlipemia   . Hypertension   . Carotid disease, bilateral     mild  . Hx of CABG 2007    [facility]    Blood pressure 126/56, pulse 60, height 5\' 6"  (1.676 m), weight 199 lb 6.4 oz (90.447 kg), last menstrual period 07/16/2002.    Tara Potts PharmD CPP [facility]

## 2014-08-22 NOTE — Assessment & Plan Note (Addendum)
BP excellent today at 126/56.  She is well under the goal of <150/90.  I have asked that she continue all her current medications without change and try to stay as active as possible, so that her pressure will stay WNL.  I can see her again in the future should her BP become elevated.

## 2014-09-01 ENCOUNTER — Ambulatory Visit: Payer: Medicare Other | Admitting: Neurology

## 2014-09-02 ENCOUNTER — Ambulatory Visit
Admission: RE | Admit: 2014-09-02 | Discharge: 2014-09-02 | Disposition: A | Discharge status: Home / Self Care | Payer: Medicare Other | Source: Ambulatory Visit | Attending: Neurology | Admitting: Neurology

## 2014-09-02 DIAGNOSIS — R269 Unspecified abnormalities of gait and mobility: Secondary | ICD-10-CM | POA: Diagnosis not present

## 2014-09-02 DIAGNOSIS — R202 Paresthesia of skin: Secondary | ICD-10-CM | POA: Diagnosis not present

## 2014-09-07 ENCOUNTER — Telehealth: Payer: Self-pay | Admitting: Neurology

## 2014-09-07 NOTE — Telephone Encounter (Signed)
Patient aware of results.  A follow up appt has been scheduled.

## 2014-09-07 NOTE — Telephone Encounter (Signed)
Michelle: Please call patient, MRI of cervical, and lumbar spine showed multilevel degenerative disc disease, with potential impingement on the nerves,  Please give her a follow-up appointment to my next available, will go over her MRI findings  Abnormal MRI scan of the lumbar spine showing prominent spondylitic changes and anterior listhesis most prominent at L4-5 where there is severe bilateral foraminal and moderate posterior canal narrowing. There are mild spondylitic changes at L3-4 resulting in severe posterior canal and mild right-sided foraminal narrowing as well.  Abnormal MRI scan of the cervical spine showing prominent spondylitic changes most severe at C5-6 where there is severe right-sided foraminal narrowing. There are mild spondylitic changes at C4-5 and C6-7 with mild canal and foraminal narrowing as well.

## 2014-09-09 ENCOUNTER — Ambulatory Visit (INDEPENDENT_AMBULATORY_CARE_PROVIDER_SITE_OTHER): Payer: Medicare Other | Admitting: Neurology

## 2014-09-09 ENCOUNTER — Telehealth (HOSPITAL_COMMUNITY): Payer: Self-pay | Admitting: *Deleted

## 2014-09-09 ENCOUNTER — Encounter: Payer: Self-pay | Admitting: Neurology

## 2014-09-09 VITALS — BP 175/72 | HR 83 | Ht 66.0 in | Wt 202.0 lb

## 2014-09-09 DIAGNOSIS — M5417 Radiculopathy, lumbosacral region: Secondary | ICD-10-CM | POA: Diagnosis not present

## 2014-09-09 DIAGNOSIS — I2510 Atherosclerotic heart disease of native coronary artery without angina pectoris: Secondary | ICD-10-CM

## 2014-09-09 DIAGNOSIS — R269 Unspecified abnormalities of gait and mobility: Secondary | ICD-10-CM

## 2014-09-09 DIAGNOSIS — M4802 Spinal stenosis, cervical region: Secondary | ICD-10-CM | POA: Diagnosis not present

## 2014-09-09 DIAGNOSIS — R202 Paresthesia of skin: Secondary | ICD-10-CM

## 2014-09-09 MED ORDER — GABAPENTIN 100 MG PO CAPS: 100.0000 mg | ORAL_CAPSULE | Freq: Three times a day (TID) | ORAL | Status: DC

## 2014-09-09 NOTE — Progress Notes (Signed)
PATIENT: Tara Potts DOB: 03/04/1934  HISTORICAL  Tara Potts is a 81 years old right-handed African-American female, referred by her primary care physician Dr. [name] for evaluation of intermittent bilateral leg cramping, paresthesia,  She had past medical history of coronary artery disease, hypertension, hyperlipidemia,  Since 2014, she noticed mild gait difficulty, intermittent low back pain, frequent nocturia,, in addition, she noticed bilateral feet paresthesia, calf muscle cramping, especially at nighttime, gradually getting worse over the past 1 year, now she woke up every 2-3 hours, because bilateral feet swollen sensation, numbness tingling, and also bilateral calf muscle cramping, sometimes muscle cramping involving whole leg, she has to work it out  She also complains of intermittent finger cramping, paresthesia, she denies shooting pain from back to her lower extremity, intermittent groin pain, neck pain, she has no bowel and bladder incontinence,  Her symptoms are most obvious during night time, not bothersome during daytime, but she does has mild gait difficulty  Laboratory evaluation from primary care office September 2015, normal CMP, CBC, LDL was elevated 280.  She was given Neurontin 300 mg," it has made me crazy", she could not tolerate the medications  UPDATE Feb 25th 2016: She lives with her daughter, she has intermittent bilateral feet, calf cramping, paresthesia, woke her up almost every night, she still drives, has urinary urgency, occasionally bowel incontinence. She has occasionally low back pain, radiating pain to her right leg.    We have reviewed MRI scan of the lumbar spine showing prominent spondylitic changes and anterior listhesis most prominent at L4-5 where there is severe bilateral foraminal and moderate posterior canal narrowing. There are mild spondylitic changes at L3-4 resulting in severe posterior canal and mild right-sided foraminal  narrowing as well.  MRI scan of the cervical spine showing prominent spondylitic changes most severe at C5-6 where there is severe right-sided foraminal narrowing. There are mild spondylitic changes at C4-5 and C6-7 with mild canal and foraminal narrowing as well.  REVIEW OF SYSTEMS: Full 14 system review of systems performed and notable only for joints pain, cramps, aching muscles  ALLERGIES: No Known Allergies  HOME MEDICATIONS: Current Outpatient Prescriptions on File Prior to Visit  Medication Sig Dispense Refill  . Ascorbic Acid (VITAMIN C PO) Take by mouth.      [name] aspirin 325 MG tablet Take 325 mg by mouth daily.      [name] CALCIUM PO Take by mouth.      . co-enzyme Q-10 30 MG capsule Take 30 mg by mouth 3 (three) times daily.    . COMBIPATCH 0.05-0.14 MG/DAY PLACE 1 PATCH ONTO SKIN 2 TIMES WEEKLY (Patient taking differently: PLACE 1 PATCH ONTO SKIN 2 TIMES WEEKLY PRN) 8 patch 6  . cyclobenzaprine (FLEXERIL) 10 MG tablet Take 10 mg by mouth at bedtime as needed.    . fish oil-omega-3 fatty acids 1000 MG capsule Take 2 g by mouth daily.      . furosemide (LASIX) 40 MG tablet 40 mg daily.  6  . glucosamine-chondroitin 500-400 MG tablet Take 2 tablets by mouth 2 (two) times daily.    [name] losartan-hydrochlorothiazide (HYZAAR) 50-12.5 MG per tablet Take 1 tablet by mouth daily. 30 tablet 6  . Magnesium Oxide 200 MG TABS Take 0.5 tablets (100 mg total) by mouth daily. 15 tablet 5  . metoprolol (LOPRESSOR) 50 MG tablet Take 1.5 tablets (75 mg total) by mouth 2 (two) times daily. 90 tablet 5  . Multiple Vitamin (MULTIVITAMIN) tablet Take  1 tablet by mouth daily.      . nitroGLYCERIN (NITROLINGUAL) 0.4 MG/SPRAY spray Place 1 spray under the tongue every 5 (five) minutes as needed for chest pain. 12 g 1  . potassium chloride (K-DUR) 10 MEQ tablet Take 1 tablet (10 mEq total) by mouth daily. 30 tablet 6  . rosuvastatin (CRESTOR) 20 MG tablet Take 20 mg by mouth daily.     No current  facility-administered medications on file prior to visit.    PAST MEDICAL HISTORY: Past Medical History  Diagnosis Date  . Fibroid   . Menopausal symptoms   . CAD (coronary artery disease)   . Hyperlipemia   . Hypertension   . Carotid disease, bilateral     mild  . Hx of CABG 2007    [facility]    PAST SURGICAL HISTORY: Past Surgical History  Procedure Laterality Date  . Mass removed from cervix  12/1997    Benign endocervical inclusion cysts  . Foot surgery    . Triple heart bypass  05/2006    [facility] surgery    . Nm myoview ltd  01/12/2011    no ischemia  . Carotid doppler  08/27/2011    mod. right & nild left ICA stenosis  . Cardiac catheterization  01/09/2008    diffusely diseased graft to the PDA & diffuse PDA disease  . US echocardiography  10/30/2007    mild MA,MR,TR,AOV mildly sclerotic,density oted in the prox asc aorta  . Coronary artery bypass graft      FAMILY HISTORY: Family History  Problem Relation Age of Onset  . Diabetes Mother   . Hypertension Mother   . Heart disease Mother   . Heart failure Mother   . Hypertension Sister   . Breast cancer Maternal Aunt     Age 44  . Heart failure Maternal Aunt   . Heart failure Maternal Uncle     SOCIAL HISTORY:  History   Social History  . Marital Status: Widowed    Spouse Name: N/A  . Number of Children: 1  . Years of Education: college   Occupational History  . Not on file.   Social History Main Topics  . Smoking status: Former Research scientist (life sciences)  . Smokeless tobacco: Never Used     Comment: Quit 30 years ago  . Alcohol Use: No  . Drug Use: No  . Sexual Activity: No   Other Topics Concern  . Not on file   Social History Narrative   Patient lives at home and her daughter and son in law live with her. Widowed.   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   Education college.   Left handed.   Caffeine       PHYSICAL EXAM   Filed Vitals:   09/09/14 1211  BP: 175/72    Pulse: 83  Height: 5\' 6"  (1.676 m)  Weight: 202 lb (91.627 kg)    Not recorded      Body mass index is 32.62 kg/(m^2).  PHYSICAL EXAMNIATION:  Gen: NAD, conversant, well nourised, obese, well groomed                     Cardiovascular: Regular rate rhythm, no peripheral edema, warm, nontender. Eyes: Conjunctivae clear without exudates or hemorrhage Neck: Supple, no carotid bruise. Pulmonary: Clear to auscultation bilaterally   NEUROLOGICAL EXAM:  MENTAL STATUS: Speech:    Speech is normal; fluent and spontaneous with normal comprehension.  Cognition:  The patient is oriented to person, place, and time;     recent and remote memory intact;     language fluent;     normal attention, concentration,     fund of knowledge.  CRANIAL NERVES: CN II: Visual fields are full to confrontation. Fundoscopic exam is normal with sharp discs and no vascular changes. Venous pulsations are present bilaterally. Pupils are 4 mm and briskly reactive to light. Visual acuity is 20/20 bilaterally. CN III, IV, VI: extraocular movement are normal. No ptosis. CN V: Facial sensation is intact to pinprick in all 3 divisions bilaterally. Corneal responses are intact.  CN VII: Face is symmetric with normal eye closure and smile. CN VIII: Hearing is normal to rubbing fingers CN IX, X: Palate elevates symmetrically. Phonation is normal. CN XI: Head turning and shoulder shrug are intact CN XII: Tongue is midline with normal movements and no atrophy.  MOTOR: There is no pronator drift of out-stretched arms. Muscle bulk and tone are normal. Muscle strength is normal.   Shoulder abduction Shoulder external rotation Elbow flexion Elbow extension Wrist flexion Wrist extension Finger abduction Hip flexion Knee flexion Knee extension Ankle dorsi flexion Ankle plantar flexion  R 5 5 5 5 5 5 5 5 5 5 5 5   L 5 5 5 5 5 5 5 5 5 5 5 5     REFLEXES: Reflexes are 2+ and symmetric at the biceps, triceps, knees, and  ankles. Plantar responses are flexor.  SENSORY: Light touch, pinprick, position sense, and vibration sense are intact in fingers and toes.  COORDINATION: Rapid alternating movements and fine finger movements are intact. There is no dysmetria on finger-to-nose and heel-knee-shin. There are no abnormal or extraneous movements.   GAIT/STANCE: Posture is normal. Stiff, cautious gait, Romberg is absent  DIAGNOSTIC DATA (LABS, IMAGING, TESTING) - I reviewed patient records, labs, notes, testing and imaging myself where available.  No results found for: WBC, HGB, HCT, MCV, PLT    Component Value Date/Time   NA 137 02/17/2014 1046   K 4.4 02/17/2014 1046   CL 104 02/17/2014 1046   CO2 25 02/17/2014 1046   GLUCOSE 81 02/17/2014 1046   BUN 19 02/17/2014 1046   CREATININE 1.07 02/17/2014 1046   CALCIUM 9.2 02/17/2014 1046    ASSESSMENT AND PLAN  KATALEENA LEBARON is a 81 y.o. female complains of more than 1 year history of frequent bilateral feet paresthesia, calf muscle cramping frequent nocturia, on examination, she has bilateral upper and lower extremity hyperreflexia, stiff cautious gait, difficulty walking on heels,  Imaging study confirmed multilevel cervical degenerative disc disease, moderate canal stenosis, a component of cervical spondylitic myelopathy, along with significant lumbar degenerative disc disease, bilateral lumbar radiculopathy, She is still highly functional, does not want to consider surgical referral, Gabapentin 100 mg 3 times a day for symptomatic control  Return to clinic in 4-6 months    Marcial Pacas, M.D. Ph.D.  [facility] [address], [place], [place] [address] [phone]

## 2014-09-10 DIAGNOSIS — M5417 Radiculopathy, lumbosacral region: Secondary | ICD-10-CM | POA: Insufficient documentation

## 2014-09-10 DIAGNOSIS — M4802 Spinal stenosis, cervical region: Secondary | ICD-10-CM | POA: Insufficient documentation

## 2014-09-10 HISTORY — DX: Radiculopathy, lumbosacral region: M54.17

## 2014-09-21 ENCOUNTER — Other Ambulatory Visit: Payer: Self-pay | Admitting: Cardiology

## 2014-11-09 ENCOUNTER — Telehealth: Payer: Self-pay | Admitting: Cardiovascular Disease

## 2014-11-09 NOTE — Telephone Encounter (Signed)
Closed encounter °

## 2015-01-05 ENCOUNTER — Encounter: Payer: Self-pay | Admitting: Cardiovascular Disease

## 2015-01-19 ENCOUNTER — Ambulatory Visit (INDEPENDENT_AMBULATORY_CARE_PROVIDER_SITE_OTHER): Payer: Medicare Other | Admitting: Cardiovascular Disease

## 2015-01-19 ENCOUNTER — Encounter: Payer: Self-pay | Admitting: Cardiovascular Disease

## 2015-01-19 ENCOUNTER — Other Ambulatory Visit: Payer: Self-pay | Admitting: Physician Assistant

## 2015-01-19 ENCOUNTER — Ambulatory Visit: Payer: Medicare Other | Admitting: Cardiovascular Disease

## 2015-01-19 VITALS — BP 122/60 | HR 70 | Ht 66.0 in | Wt 207.4 lb

## 2015-01-19 DIAGNOSIS — I10 Essential (primary) hypertension: Secondary | ICD-10-CM | POA: Diagnosis not present

## 2015-01-19 DIAGNOSIS — I739 Peripheral vascular disease, unspecified: Secondary | ICD-10-CM | POA: Diagnosis not present

## 2015-01-19 DIAGNOSIS — E7800 Pure hypercholesterolemia, unspecified: Secondary | ICD-10-CM

## 2015-01-19 DIAGNOSIS — Z951 Presence of aortocoronary bypass graft: Secondary | ICD-10-CM | POA: Diagnosis not present

## 2015-01-19 DIAGNOSIS — I2510 Atherosclerotic heart disease of native coronary artery without angina pectoris: Secondary | ICD-10-CM

## 2015-01-19 DIAGNOSIS — I2583 Coronary atherosclerosis due to lipid rich plaque: Principal | ICD-10-CM

## 2015-01-19 NOTE — Assessment & Plan Note (Signed)
History of hypertension blood pressure measured today at 122/60. She is on metoprolol, losartan and hydrochlorothiazide. Continue current meds at current dosing

## 2015-01-19 NOTE — Assessment & Plan Note (Signed)
History of hyperlipidemia no longer taking Crestor because of leg cramps but on red yeast rice. We will check a lipid and liver profile

## 2015-01-19 NOTE — Assessment & Plan Note (Signed)
History of coronary artery disease status post coronary artery bypass grafting November 2007 at [facility].  I performed cardiac catheterization on her 01/09/08 revealing a diffusely diseased graft to the PDA as well as diffuse PDA disease. Elective to treat her medically at that time. She denies chest pain or shortness of breath.

## 2015-01-19 NOTE — Progress Notes (Signed)
01/19/2015 Tara Potts   [birth year]  [id number]  Primary Physician Horatio Pel, MD Primary Cardiologist: Lorretta Harp MD Renae Gloss    HPI: The patient is a very pleasant, 81 year old, mildly overweight, widowed [place] American female, mother of 2, grandmother to 1 grandchild who I saw 6 months ago. Her husband [name] was also a patient of mine and is deceased. She has a history of CAD status post coronary artery bypass grafting November 2007 at [facility]. Her other problems include hypertension, hyperlipidemia and mild carotid disease. She had a Myoview stress test performed January 12, 2011, which was nonischemic. Carotid Dopplers performed August 27, 2011, showed moderate right and mild left ICA stenosis scheduled to be redone in February of next year. I last cath'd her January 09, 2008, revealing diffusely diseased graft to the PDA as well as diffuse PDA disease and I elected to treat her medically at that time. Her major complaints are nocturnal leg cramps. She saw Tenny Craw PAC back in the office 6 months ago has been doing well since. She denies chest pain or shortness of breath.   Current Outpatient Prescriptions  Medication Sig Dispense Refill  . Ascorbic Acid (VITAMIN C PO) Take by mouth.      [name] aspirin 325 MG tablet Take 325 mg by mouth daily.      [name] CALCIUM PO Take by mouth.      . co-enzyme Q-10 30 MG capsule Take 30 mg by mouth 3 (three) times daily.    . COMBIPATCH 0.05-0.14 MG/DAY PLACE 1 PATCH ONTO SKIN 2 TIMES WEEKLY (Patient taking differently: PLACE 1 PATCH ONTO SKIN 2 TIMES WEEKLY PRN) 8 patch 6  . cyclobenzaprine (FLEXERIL) 10 MG tablet Take 10 mg by mouth at bedtime as needed.    . fish oil-omega-3 fatty acids 1000 MG capsule Take 2 g by mouth daily.      . furosemide (LASIX) 40 MG tablet 40 mg daily.  6  . gabapentin (NEURONTIN) 100 MG capsule Take 1 capsule (100 mg total) by mouth 3 (three) times daily. 90 capsule 11  . GARLIC  PO Take by mouth.    [name] glucosamine-chondroitin 500-400 MG tablet Take 2 tablets by mouth 2 (two) times daily.    [name] losartan-hydrochlorothiazide (HYZAAR) 50-12.5 MG per tablet TAKE 1 TABLET BY MOUTH DAILY 30 tablet 6  . Magnesium Oxide 200 MG TABS Take 0.5 tablets (100 mg total) by mouth daily. 15 tablet 5  . metoprolol (LOPRESSOR) 50 MG tablet TAKE 1 AND 1/2 TABLETS BY MOUTH TWICE DAILY 90 tablet 3  . Multiple Vitamin (MULTIVITAMIN) tablet Take 1 tablet by mouth daily.      . nitroGLYCERIN (NITROLINGUAL) 0.4 MG/SPRAY spray Place 1 spray under the tongue every 5 (five) minutes as needed for chest pain. 12 g 1  . potassium chloride (K-DUR) 10 MEQ tablet TAKE 1 TABLET BY MOUTH DAILY 30 tablet 6  . rosuvastatin (CRESTOR) 20 MG tablet Take 20 mg by mouth daily.     No current facility-administered medications for this visit.    No Known Allergies  History   Social History  . Marital Status: Widowed    Spouse Name: N/A  . Number of Children: 1  . Years of Education: college   Occupational History  . Not on file.   Social History Main Topics  . Smoking status: Former Research scientist (life sciences)  . Smokeless tobacco: Never Used     Comment: Quit 30 years ago  . Alcohol Use:  No  . Drug Use: No  . Sexual Activity: No   Other Topics Concern  . Not on file   Social History Narrative   Patient lives at home and her daughter and son in law live with her. Widowed.   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   Education college.   Left handed.   Caffeine       Review of Systems: General: negative for chills, fever, night sweats or weight changes.  Cardiovascular: negative for chest pain, dyspnea on exertion, edema, orthopnea, palpitations, paroxysmal nocturnal dyspnea or shortness of breath Dermatological: negative for rash Respiratory: negative for cough or wheezing Urologic: negative for hematuria Abdominal: negative for nausea, vomiting, diarrhea, bright red blood per rectum, melena, or  hematemesis Neurologic: negative for visual changes, syncope, or dizziness All other systems reviewed and are otherwise negative except as noted above.    Blood pressure 122/60, pulse 70, height 5\' 6"  (1.676 m), weight 207 lb 6.4 oz (94.076 kg), last menstrual period 07/16/2002.  General appearance: alert and no distress Neck: no adenopathy, no JVD, supple, symmetrical, trachea midline, thyroid not enlarged, symmetric, no tenderness/mass/nodules and soft bilateral carotid bruits Lungs: clear to auscultation bilaterally Heart: regular rate and rhythm, S1, S2 normal, no murmur, click, rub or gallop Extremities: extremities normal, atraumatic, no cyanosis or edema  EKG normal sinus rhythm at 70 without ST or T-wave changes. I personally reviewed this EKG  ASSESSMENT AND PLAN:   UNSPECIFIED PERIPHERAL VASCULAR DISEASE History of peripheral arterial disease with moderate right SFA stenosis by duplex ultrasound recently performed 07/14/14 with a right ABI 0.63 and an occluded dorsalis pedis. She really denies claudication.  Status post coronary artery bypass grafting: 2007 History of coronary artery disease status post coronary artery bypass grafting November 2007 at [facility].  I performed cardiac catheterization on her 01/09/08 revealing a diffusely diseased graft to the PDA as well as diffuse PDA disease. Elective to treat her medically at that time. She denies chest pain or shortness of breath.  Peripheral vascular disease:  Moderate right and mild left ICA stenosis. History of mild to moderate right and mild left ICA stenosis. This was by duplex ultrasound last year. She is neurologically asymptomatic.  Essential hypertension History of hypertension blood pressure measured today at 122/60. She is on metoprolol, losartan and hydrochlorothiazide. Continue current meds at current dosing  Elevated cholesterol History of hyperlipidemia no longer taking Crestor because of leg cramps but  on red yeast rice. We will check a lipid and liver profile      Lorretta Harp MD [facility] 01/19/2015 2:41 PM

## 2015-01-19 NOTE — Assessment & Plan Note (Signed)
History of mild to moderate right and mild left ICA stenosis. This was by duplex ultrasound last year. She is neurologically asymptomatic.

## 2015-01-19 NOTE — Assessment & Plan Note (Signed)
History of peripheral arterial disease with moderate right SFA stenosis by duplex ultrasound recently performed 07/14/14 with a right ABI 0.63 and an occluded dorsalis pedis. She really denies claudication.

## 2015-01-19 NOTE — Patient Instructions (Signed)
Your physician recommends that you return for lab work at your earliest convenience - FASTING.  Dr Berry recommends that you schedule a follow-up appointment in 1 year. You will receive a reminder letter in the mail two months in advance. If you don't receive a letter, please call our office to schedule the follow-up appointment. 

## 2015-02-20 ENCOUNTER — Telehealth: Payer: Self-pay | Admitting: Internal Medicine

## 2015-02-20 NOTE — Telephone Encounter (Signed)
Called patient back because concerned for BPs in 140s and dizziness. Patient was asked to check her BP which was 170/58 by arm cuff. She also was complaining of some neck pressure which has been happening lately. It was recommended to her to go to the ED and get herself evaluated however she refused to do so and will like to see her PCP tomorrow in the morning. Patient also said that the pain is not happening right now so will like to wait. She is currently taking losratan 50/HCTZ 12.5 mg.  I reviewed her history and it appears that she has cervical stenosis and lumbar radiculopathy with chronic issues with unsteady gait. This needs to be further evaluated since she was thinking it is due to her BP pills.

## 2015-02-21 ENCOUNTER — Telehealth: Payer: Self-pay | Admitting: Cardiovascular Disease

## 2015-02-21 NOTE — Telephone Encounter (Signed)
Per answering service:  Pt called in 8/7 evening at 9:53pm and said that her BP was 129/65 and it increased to 145/58 and she says that it seems to be increasing and she is also experiencing some dizziness. Please call  Thanks

## 2015-02-21 NOTE — Telephone Encounter (Signed)
Spoke with patient who reports her BP is fluctuating she reports it will be 120s/130s and sometimes 160s/170s and sometimes in 190s. She states her diastolic is sometimes in the 40s. She states her HR is 70s-80s. She spoke with on-call MD last nite and no med changes were made. It was documented during this call encounter she was going to see her PCP today but she states she cannot see PCP until next Monday. She has not checked her BP this morning as she is still in bed.   She reports she takes losartan-hctz QAM.   She reports dizziness - doesn't feel stable walking. She reports that 2 weeks ago she noticed knots in her neck - now there is only one. She reports some sensations from that part of her neck into her head. She reports sporadic pain from foot, legs, groin, back. She denies chest pain/pressure/tightness, denies SOB, denies edema.   Advised patient to monitor BP and HR twice daily for a few days - check in AM after medications and then in afternoon or early evening. Check about same time every day and when she has been resting for about 5 minutes. Advised her to call back with these readings so MD could review and advise on any med changes as necessary.   Patient agreed with plan and voiced understanding.   Message routed to MD as Juluis Rainier

## 2015-03-01 DIAGNOSIS — G629 Polyneuropathy, unspecified: Secondary | ICD-10-CM | POA: Diagnosis not present

## 2015-03-01 DIAGNOSIS — E559 Vitamin D deficiency, unspecified: Secondary | ICD-10-CM | POA: Diagnosis not present

## 2015-03-01 DIAGNOSIS — N289 Disorder of kidney and ureter, unspecified: Secondary | ICD-10-CM | POA: Diagnosis not present

## 2015-03-01 DIAGNOSIS — M791 Myalgia: Secondary | ICD-10-CM | POA: Diagnosis not present

## 2015-03-01 DIAGNOSIS — I10 Essential (primary) hypertension: Secondary | ICD-10-CM | POA: Diagnosis not present

## 2015-03-01 DIAGNOSIS — N390 Urinary tract infection, site not specified: Secondary | ICD-10-CM | POA: Diagnosis not present

## 2015-03-01 DIAGNOSIS — R351 Nocturia: Secondary | ICD-10-CM | POA: Diagnosis not present

## 2015-03-10 DIAGNOSIS — E780 Pure hypercholesterolemia: Secondary | ICD-10-CM | POA: Diagnosis not present

## 2015-03-10 DIAGNOSIS — I10 Essential (primary) hypertension: Secondary | ICD-10-CM | POA: Diagnosis not present

## 2015-03-16 ENCOUNTER — Other Ambulatory Visit: Payer: Self-pay | Admitting: Cardiovascular Disease

## 2015-03-16 DIAGNOSIS — I2510 Atherosclerotic heart disease of native coronary artery without angina pectoris: Secondary | ICD-10-CM | POA: Diagnosis not present

## 2015-03-16 DIAGNOSIS — I6523 Occlusion and stenosis of bilateral carotid arteries: Secondary | ICD-10-CM

## 2015-03-16 DIAGNOSIS — E789 Disorder of lipoprotein metabolism, unspecified: Secondary | ICD-10-CM | POA: Diagnosis not present

## 2015-03-23 ENCOUNTER — Ambulatory Visit (HOSPITAL_COMMUNITY)
Admission: RE | Admit: 2015-03-23 | Discharge: 2015-03-23 | Disposition: A | Discharge status: Home / Self Care | Payer: Medicare Other | Source: Ambulatory Visit | Attending: Cardiovascular Disease | Admitting: Cardiovascular Disease

## 2015-03-23 DIAGNOSIS — I10 Essential (primary) hypertension: Secondary | ICD-10-CM | POA: Diagnosis not present

## 2015-03-23 DIAGNOSIS — I6523 Occlusion and stenosis of bilateral carotid arteries: Secondary | ICD-10-CM | POA: Diagnosis not present

## 2015-03-23 DIAGNOSIS — I2510 Atherosclerotic heart disease of native coronary artery without angina pectoris: Secondary | ICD-10-CM | POA: Insufficient documentation

## 2015-03-23 DIAGNOSIS — E785 Hyperlipidemia, unspecified: Secondary | ICD-10-CM | POA: Diagnosis not present

## 2015-03-24 DIAGNOSIS — E780 Pure hypercholesterolemia: Secondary | ICD-10-CM | POA: Diagnosis not present

## 2015-03-24 DIAGNOSIS — I10 Essential (primary) hypertension: Secondary | ICD-10-CM | POA: Diagnosis not present

## 2015-03-31 ENCOUNTER — Other Ambulatory Visit: Payer: Self-pay

## 2015-03-31 DIAGNOSIS — I6523 Occlusion and stenosis of bilateral carotid arteries: Secondary | ICD-10-CM

## 2015-04-05 DIAGNOSIS — I10 Essential (primary) hypertension: Secondary | ICD-10-CM | POA: Diagnosis not present

## 2015-04-10 ENCOUNTER — Other Ambulatory Visit: Payer: Self-pay | Admitting: Physician Assistant

## 2015-04-11 NOTE — Telephone Encounter (Signed)
Rx request sent to pharmacy.  

## 2015-04-20 ENCOUNTER — Other Ambulatory Visit: Payer: Self-pay | Admitting: Physician Assistant

## 2015-04-20 NOTE — Telephone Encounter (Signed)
Rx(s) sent to pharmacy electronically.  

## 2015-04-27 ENCOUNTER — Other Ambulatory Visit: Payer: Self-pay | Admitting: Gynecology

## 2015-05-03 DIAGNOSIS — Z0000 Encounter for general adult medical examination without abnormal findings: Secondary | ICD-10-CM | POA: Diagnosis not present

## 2015-05-03 DIAGNOSIS — N390 Urinary tract infection, site not specified: Secondary | ICD-10-CM | POA: Diagnosis not present

## 2015-05-03 DIAGNOSIS — E559 Vitamin D deficiency, unspecified: Secondary | ICD-10-CM | POA: Diagnosis not present

## 2015-05-03 DIAGNOSIS — Z0001 Encounter for general adult medical examination with abnormal findings: Secondary | ICD-10-CM | POA: Diagnosis not present

## 2015-05-03 DIAGNOSIS — E7800 Pure hypercholesterolemia, unspecified: Secondary | ICD-10-CM | POA: Diagnosis not present

## 2015-05-03 DIAGNOSIS — I10 Essential (primary) hypertension: Secondary | ICD-10-CM | POA: Diagnosis not present

## 2015-05-06 DIAGNOSIS — I10 Essential (primary) hypertension: Secondary | ICD-10-CM | POA: Diagnosis not present

## 2015-05-06 DIAGNOSIS — E789 Disorder of lipoprotein metabolism, unspecified: Secondary | ICD-10-CM | POA: Diagnosis not present

## 2015-05-06 DIAGNOSIS — M810 Age-related osteoporosis without current pathological fracture: Secondary | ICD-10-CM | POA: Diagnosis not present

## 2015-05-06 DIAGNOSIS — N183 Chronic kidney disease, stage 3 (moderate): Secondary | ICD-10-CM | POA: Diagnosis not present

## 2015-05-06 DIAGNOSIS — I2510 Atherosclerotic heart disease of native coronary artery without angina pectoris: Secondary | ICD-10-CM | POA: Diagnosis not present

## 2015-05-25 ENCOUNTER — Other Ambulatory Visit: Payer: Self-pay | Admitting: Gynecology

## 2015-05-26 NOTE — Telephone Encounter (Signed)
Was due for CE 02/2014. Has appt scheduled with your for CE 08/04/14.

## 2015-06-01 ENCOUNTER — Other Ambulatory Visit: Payer: Self-pay

## 2015-06-01 NOTE — Telephone Encounter (Signed)
Last CE was 03/03/14. She is scheduled for 08/05/15 with you.

## 2015-06-13 DIAGNOSIS — H04211 Epiphora due to excess lacrimation, right lacrimal gland: Secondary | ICD-10-CM | POA: Diagnosis not present

## 2015-06-13 DIAGNOSIS — H26493 Other secondary cataract, bilateral: Secondary | ICD-10-CM | POA: Diagnosis not present

## 2015-06-13 DIAGNOSIS — H43813 Vitreous degeneration, bilateral: Secondary | ICD-10-CM | POA: Diagnosis not present

## 2015-06-13 DIAGNOSIS — H52203 Unspecified astigmatism, bilateral: Secondary | ICD-10-CM | POA: Diagnosis not present

## 2015-06-16 DIAGNOSIS — E7800 Pure hypercholesterolemia, unspecified: Secondary | ICD-10-CM | POA: Diagnosis not present

## 2015-06-30 ENCOUNTER — Other Ambulatory Visit: Payer: Self-pay | Admitting: Cardiovascular Disease

## 2015-06-30 NOTE — Telephone Encounter (Signed)
Rx(s) sent to pharmacy electronically.  

## 2015-08-05 ENCOUNTER — Encounter: Payer: Medicare Other | Admitting: Gynecology

## 2015-08-06 DIAGNOSIS — R112 Nausea with vomiting, unspecified: Secondary | ICD-10-CM | POA: Diagnosis not present

## 2015-08-06 DIAGNOSIS — R100 Acute abdomen: Secondary | ICD-10-CM | POA: Diagnosis not present

## 2015-08-06 DIAGNOSIS — N289 Disorder of kidney and ureter, unspecified: Secondary | ICD-10-CM | POA: Diagnosis not present

## 2015-08-06 DIAGNOSIS — R1084 Generalized abdominal pain: Secondary | ICD-10-CM | POA: Diagnosis not present

## 2015-08-06 DIAGNOSIS — K449 Diaphragmatic hernia without obstruction or gangrene: Secondary | ICD-10-CM | POA: Diagnosis not present

## 2015-08-06 DIAGNOSIS — Z951 Presence of aortocoronary bypass graft: Secondary | ICD-10-CM | POA: Diagnosis not present

## 2015-08-06 DIAGNOSIS — R252 Cramp and spasm: Secondary | ICD-10-CM | POA: Diagnosis not present

## 2015-08-06 DIAGNOSIS — E785 Hyperlipidemia, unspecified: Secondary | ICD-10-CM | POA: Diagnosis not present

## 2015-08-06 DIAGNOSIS — I10 Essential (primary) hypertension: Secondary | ICD-10-CM | POA: Diagnosis not present

## 2015-08-06 DIAGNOSIS — R531 Weakness: Secondary | ICD-10-CM | POA: Diagnosis not present

## 2015-08-06 DIAGNOSIS — K2970 Gastritis, unspecified, without bleeding: Secondary | ICD-10-CM | POA: Diagnosis not present

## 2015-08-06 DIAGNOSIS — K5730 Diverticulosis of large intestine without perforation or abscess without bleeding: Secondary | ICD-10-CM | POA: Diagnosis not present

## 2015-08-06 DIAGNOSIS — I2510 Atherosclerotic heart disease of native coronary artery without angina pectoris: Secondary | ICD-10-CM | POA: Diagnosis not present

## 2015-08-06 DIAGNOSIS — M7989 Other specified soft tissue disorders: Secondary | ICD-10-CM | POA: Diagnosis not present

## 2015-08-06 DIAGNOSIS — R55 Syncope and collapse: Secondary | ICD-10-CM | POA: Diagnosis not present

## 2015-08-07 DIAGNOSIS — R112 Nausea with vomiting, unspecified: Secondary | ICD-10-CM | POA: Diagnosis not present

## 2015-08-07 DIAGNOSIS — I10 Essential (primary) hypertension: Secondary | ICD-10-CM | POA: Diagnosis not present

## 2015-08-07 DIAGNOSIS — E785 Hyperlipidemia, unspecified: Secondary | ICD-10-CM | POA: Diagnosis not present

## 2015-08-07 DIAGNOSIS — M7989 Other specified soft tissue disorders: Secondary | ICD-10-CM | POA: Diagnosis not present

## 2015-08-17 DIAGNOSIS — J029 Acute pharyngitis, unspecified: Secondary | ICD-10-CM | POA: Diagnosis not present

## 2015-09-20 DIAGNOSIS — E7800 Pure hypercholesterolemia, unspecified: Secondary | ICD-10-CM | POA: Diagnosis not present

## 2015-09-26 DIAGNOSIS — E7800 Pure hypercholesterolemia, unspecified: Secondary | ICD-10-CM | POA: Diagnosis not present

## 2015-09-26 DIAGNOSIS — R55 Syncope and collapse: Secondary | ICD-10-CM | POA: Diagnosis not present

## 2015-10-03 DIAGNOSIS — M79671 Pain in right foot: Secondary | ICD-10-CM | POA: Diagnosis not present

## 2015-10-03 DIAGNOSIS — M5431 Sciatica, right side: Secondary | ICD-10-CM | POA: Diagnosis not present

## 2015-10-03 DIAGNOSIS — E7800 Pure hypercholesterolemia, unspecified: Secondary | ICD-10-CM | POA: Diagnosis not present

## 2015-10-03 DIAGNOSIS — R55 Syncope and collapse: Secondary | ICD-10-CM | POA: Diagnosis not present

## 2015-10-05 ENCOUNTER — Ambulatory Visit (INDEPENDENT_AMBULATORY_CARE_PROVIDER_SITE_OTHER): Payer: Medicare Other | Admitting: Neurology

## 2015-10-05 ENCOUNTER — Encounter: Payer: Self-pay | Admitting: Neurology

## 2015-10-05 VITALS — Ht 66.0 in | Wt 195.0 lb

## 2015-10-05 DIAGNOSIS — R269 Unspecified abnormalities of gait and mobility: Secondary | ICD-10-CM

## 2015-10-05 DIAGNOSIS — M4802 Spinal stenosis, cervical region: Secondary | ICD-10-CM

## 2015-10-05 DIAGNOSIS — R55 Syncope and collapse: Secondary | ICD-10-CM | POA: Diagnosis not present

## 2015-10-05 DIAGNOSIS — IMO0002 Reserved for concepts with insufficient information to code with codable children: Secondary | ICD-10-CM

## 2015-10-05 DIAGNOSIS — M5417 Radiculopathy, lumbosacral region: Secondary | ICD-10-CM

## 2015-10-05 NOTE — Progress Notes (Addendum)
Chief Complaint  Patient presents with  . Peripheral Neuropathy    Reports painful burning, tingling and cramps in her bilataral lower extremities and feet, right side worse than left.  She discontinued gabapentin because it made her feel sick on her stomach.  She also has pain across her lower back.  . Seizure-like activity    While on vacation, her friend, who is also a Restaurant manager, fast food, checked her back pain.  Reports having two "black-out" spells during her exam.  She lost control of her urine, nausea/vomiting and severe confusion.  She was taken to the hospital in [place], [place] for an overnight observation.  Says she has passed out several times in the past.      PATIENT: Tara Potts DOB: [birth year]  HISTORICAL  Tara Potts is a 82 years old right-handed African-American female, referred by her primary care physician Dr. [name] for evaluation of intermittent bilateral leg cramping, paresthesia in Jan 2016  She had past medical history of coronary artery disease, hypertension, hyperlipidemia,  Since 2014, she noticed mild gait difficulty, intermittent low back pain, frequent nocturia, in addition, she noticed bilateral feet paresthesia, calf muscle cramping, especially at nighttime, gradually getting worse over the past 1 year, now she woke up every 2-3 hours, because bilateral feet swollen sensation, numbness tingling, and also bilateral calf muscle cramping, sometimes muscle cramping involving whole leg, she has to work it out  She also complains of intermittent finger cramping, paresthesia, she denies shooting pain from back to her lower extremity, intermittent groin pain, neck pain, she has no bowel and bladder incontinence,  Her symptoms are most obvious during night time, not bothersome during daytime, but she does has mild gait difficulty  Laboratory evaluation from primary care office September 2015, normal CMP, CBC, LDL was elevated 280.  She was given Neurontin 300  mg," it has made me crazy", she could not tolerate the medications  UPDATE Feb 25th 2016: She lives with her daughter Tara Potts, she has intermittent bilateral feet, calf cramping, paresthesia, woke her up almost every night, she still drives, has urinary urgency, occasionally bowel incontinence. She has occasionally low back pain, radiating pain to her right leg.    We have reviewed MRI scan of the lumbar spine showing prominent spondylitic changes and anterior listhesis most prominent at L4-5 where there is severe bilateral foraminal and moderate posterior canal narrowing. There are mild spondylitic changes at L3-4 resulting in severe posterior canal and mild right-sided foraminal narrowing as well.  MRI scan of the cervical spine showing prominent spondylitic changes most severe at C5-6 where there is severe right-sided foraminal narrowing. There are mild spondylitic changes at C4-5 and C6-7 with mild canal and foraminal narrowing as well.  UPDATE October 05 2015: I saw her previously for bilateral lower extremity paresthesia and cramping, last visit was in February 2016, EMG/NCS in Jan 2016:  There is electrodiagnostic evidence of mild length dependent axonal peripheral neuropathy, in addition, there was evidence of chronic bilateral lumbosacral radiculopathies. Also evidence of moderate left carpal tunnel syndromes  Her complains of bilateral lower extremity cramping are likely due to combination of peripheral neuropathy and lumbar radiculopathy, She still complains of bilateral leg cramping, mailnly happened during her sleep, she complains of upset stomach while taking gabapentin, she has stopped taking it,   She went on a cruise trip with her daughter in Jan 2017, she noticed mild gait difficulty, constant bilateral toe pain,  She was evaluated by her chiropractor friend after her  cruise,  during evaluation, she had sudden onset of loss of conciousness, dropped to floor,  The same day, she fainted  again in a sitting position, "making a sound" with urinary incontinence,confusion afterwards,  she was taken by ambulance to the local hospital in [place],  left hospital [place], She later was found to have pneumonia, was reevaluated by her primary care Dr. [name] office, was given antibiotics, feeling much better afterwards.  This is the 3rd time she has fainted, the first time was around 2012, she was in the bedroom, without any clear triggers, she woke up on the floor, no tongue biting, no urinary incontinence. 2nd times was in 2016, she was sitting at her kitchen table, woke up on the floor.  She has no warning sign, no chest, no palpitation.  REVIEW OF SYSTEMS: Full 14 system review of systems performed and notable only for joints pain, cramps, aching muscles  ALLERGIES: No Known Allergies  HOME MEDICATIONS: Current Outpatient Prescriptions on File Prior to Visit  Medication Sig Dispense Refill  . Ascorbic Acid (VITAMIN C PO) Take by mouth.      [name] aspirin 325 MG tablet Take 325 mg by mouth daily.      [name] co-enzyme Q-10 30 MG capsule Take 30 mg by mouth daily.     . COMBIPATCH 0.05-0.14 MG/DAY PLACE 1 PATCH ONTO THE SKIN TWICE WEEKLY (Patient taking differently: PLACE 1 PATCH ONTO THE SKIN MONTHLY) 8 patch 2  . cyclobenzaprine (FLEXERIL) 10 MG tablet Take 10 mg by mouth at bedtime as needed.    . fish oil-omega-3 fatty acids 1000 MG capsule Take 2 g by mouth daily.      . furosemide (LASIX) 40 MG tablet 40 mg daily.  6  . GARLIC PO Take by mouth.    [name] glucosamine-chondroitin 500-400 MG tablet Take 2 tablets by mouth 2 (two) times daily.    [name] losartan-hydrochlorothiazide (HYZAAR) 50-12.5 MG per tablet TAKE 1 TABLET BY MOUTH DAILY 30 tablet 6  . metoprolol (LOPRESSOR) 50 MG tablet TAKE 1 AND 1/2 TABLETS BY MOUTH TWICE DAILY 90 tablet 4  . Multiple Vitamin (MULTIVITAMIN) tablet Take 1 tablet by mouth daily.      . potassium chloride (K-DUR) 10 MEQ tablet TAKE 1 TABLET BY  MOUTH EVERY DAY 30 tablet 9   No current facility-administered medications on file prior to visit.    PAST MEDICAL HISTORY: Past Medical History  Diagnosis Date  . Fibroid   . Menopausal symptoms   . CAD (coronary artery disease)   . Hyperlipemia   . Hypertension   . Carotid disease, bilateral (HCC)     mild  . Hx of CABG 2007    [place] activity [facility])   . Peripheral neuropathy ([place])     PAST SURGICAL HISTORY: Past Surgical History  Procedure Laterality Date  . Mass removed from cervix  12/1997    Benign endocervical inclusion cysts  . Foot surgery    . Triple heart bypass  05/2006    [place] surgery    . Nm myoview ltd  01/12/2011    no ischemia  . Carotid doppler  08/27/2011    mod. right & nild left ICA stenosis  . Cardiac catheterization  01/09/2008    diffusely diseased graft to the PDA & diffuse PDA disease  . US echocardiography  10/30/2007    mild MA,MR,TR,AOV mildly sclerotic,density oted in the prox asc aorta  . Coronary artery  bypass graft      FAMILY HISTORY: Family History  Problem Relation Age of Onset  . Diabetes Mother   . Hypertension Mother   . Heart disease Mother   . Heart failure Mother   . Hypertension Sister   . Breast cancer Maternal Aunt     Age 24  . Heart failure Maternal Aunt   . Heart failure Maternal Uncle     SOCIAL HISTORY:  Social History   Social History  . Marital Status: Widowed    Spouse Name: N/A  . Number of Children: 1  . Years of Education: college   Occupational History  . Not on file.   Social History Main Topics  . Smoking status: Former Research scientist (life sciences)  . Smokeless tobacco: Never Used     Comment: Quit 30 years ago  . Alcohol Use: No  . Drug Use: No  . Sexual Activity: No   Other Topics Concern  . Not on file   Social History Narrative   Patient lives at home and her daughter and son in law live with her. Widowed.   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    Education college.   Left handed.   Caffeine       PHYSICAL EXAM   Filed Vitals:   10/05/15 1058  Height: 5\' 6"  (1.676 m)  Weight: 195 lb (88.451 kg)    Not recorded      Body mass index is 31.49 kg/(m^2).  PHYSICAL EXAMNIATION:  Gen: NAD, conversant, well nourised, obese, well groomed                     Cardiovascular: Regular rate rhythm, no peripheral edema, warm, nontender. Eyes: Conjunctivae clear without exudates or hemorrhage Neck: Supple, no carotid bruise. Pulmonary: Clear to auscultation bilaterally   NEUROLOGICAL EXAM:  MENTAL STATUS: Speech:    Speech is normal; fluent and spontaneous with normal comprehension.  Cognition:    The patient is oriented to person, place, and time;     recent and remote memory intact;     language fluent;     normal attention, concentration,     fund of knowledge.  CRANIAL NERVES: CN II: Visual fields are full to confrontation. Fundoscopic exam is normal with sharp discs and no vascular changes. Venous pulsations are present bilaterally. Pupils are 4 mm and briskly reactive to light. Visual acuity is 20/20 bilaterally. CN III, IV, VI: extraocular movement are normal. No ptosis. CN V: Facial sensation is intact to pinprick in all 3 divisions bilaterally. Corneal responses are intact.  CN VII: Face is symmetric with normal eye closure and smile. CN VIII: Hearing is normal to rubbing fingers CN IX, X: Palate elevates symmetrically. Phonation is normal. CN XI: Head turning and shoulder shrug are intact CN XII: Tongue is midline with normal movements and no atrophy.  MOTOR: There is no pronator drift of out-stretched arms. Muscle bulk and tone are normal. Muscle strength is normal.  REFLEXES: Reflexes are 2+ and symmetric at the biceps, triceps, knees, and ankles. Plantar responses are flexor.  SENSORY: Light touch, pinprick, position sense, and vibration sense are intact in fingers and toes.  COORDINATION: Rapid  alternating movements and fine finger movements are intact. There is no dysmetria on finger-to-nose and heel-knee-shin. There are no abnormal or extraneous movements.   GAIT/STANCE: Posture is normal. Stiff, cautious gait, Romberg is absent  DIAGNOSTIC DATA (LABS, IMAGING, TESTING) - I reviewed patient records, labs, notes, testing and imaging myself  where available.  No results found for: WBC, HGB, HCT, MCV, PLT    Component Value Date/Time   NA 137 02/17/2014 1046   K 4.4 02/17/2014 1046   CL 104 02/17/2014 1046   CO2 25 02/17/2014 1046   GLUCOSE 81 02/17/2014 1046   BUN 19 02/17/2014 1046   CREATININE 1.07 02/17/2014 1046   CALCIUM 9.2 02/17/2014 1046    ASSESSMENT AND PLAN  SHAMS URENA is a 82 y.o. female   New-onset passing out episodes  Differentiation diagnosis including seizure versus cardiac syncope  Proceed with cardiac monitoring  MRI of the brain with and without contrast  EEG  No driving until episode free for 6 months  Bilateral lower extremity paresthesia, muscle cramping  Multifactorial, including multilevel cervical degenerative disc disease, moderate central canal stenosis, along with significant lumbar degenerative disc disease with bilateral lumbar radiculopathy,  Gait abnormality  Referred to home health physical therapy   Marcial Pacas, M.D. Ph.D.  [facility] [address], [place], [place] [address] [phone]

## 2015-10-12 DIAGNOSIS — R55 Syncope and collapse: Secondary | ICD-10-CM | POA: Diagnosis not present

## 2015-10-12 DIAGNOSIS — R600 Localized edema: Secondary | ICD-10-CM | POA: Diagnosis not present

## 2015-10-13 ENCOUNTER — Ambulatory Visit
Admission: RE | Admit: 2015-10-13 | Discharge: 2015-10-13 | Disposition: A | Discharge status: Home / Self Care | Payer: Medicare Other | Source: Ambulatory Visit | Attending: Neurology | Admitting: Neurology

## 2015-10-13 DIAGNOSIS — R269 Unspecified abnormalities of gait and mobility: Secondary | ICD-10-CM

## 2015-10-13 DIAGNOSIS — M5417 Radiculopathy, lumbosacral region: Secondary | ICD-10-CM

## 2015-10-13 DIAGNOSIS — R55 Syncope and collapse: Secondary | ICD-10-CM | POA: Diagnosis not present

## 2015-10-13 DIAGNOSIS — IMO0002 Reserved for concepts with insufficient information to code with codable children: Secondary | ICD-10-CM

## 2015-10-13 DIAGNOSIS — M4802 Spinal stenosis, cervical region: Secondary | ICD-10-CM

## 2015-10-13 MED ORDER — GADOBENATE DIMEGLUMINE 529 MG/ML IV SOLN: 18.0000 mL | Freq: Once | INTRAVENOUS | Status: DC | PRN

## 2015-10-14 ENCOUNTER — Telehealth: Payer: Self-pay | Admitting: Neurology

## 2015-10-14 DIAGNOSIS — M4802 Spinal stenosis, cervical region: Secondary | ICD-10-CM | POA: Diagnosis not present

## 2015-10-14 DIAGNOSIS — Z951 Presence of aortocoronary bypass graft: Secondary | ICD-10-CM | POA: Diagnosis not present

## 2015-10-14 DIAGNOSIS — I129 Hypertensive chronic kidney disease with stage 1 through stage 4 chronic kidney disease, or unspecified chronic kidney disease: Secondary | ICD-10-CM | POA: Diagnosis not present

## 2015-10-14 DIAGNOSIS — M4806 Spinal stenosis, lumbar region: Secondary | ICD-10-CM | POA: Diagnosis not present

## 2015-10-14 DIAGNOSIS — I2510 Atherosclerotic heart disease of native coronary artery without angina pectoris: Secondary | ICD-10-CM | POA: Diagnosis not present

## 2015-10-14 DIAGNOSIS — N189 Chronic kidney disease, unspecified: Secondary | ICD-10-CM | POA: Diagnosis not present

## 2015-10-14 DIAGNOSIS — Z7982 Long term (current) use of aspirin: Secondary | ICD-10-CM | POA: Diagnosis not present

## 2015-10-14 DIAGNOSIS — G629 Polyneuropathy, unspecified: Secondary | ICD-10-CM | POA: Diagnosis not present

## 2015-10-14 DIAGNOSIS — Z87891 Personal history of nicotine dependence: Secondary | ICD-10-CM | POA: Diagnosis not present

## 2015-10-14 DIAGNOSIS — Z8701 Personal history of pneumonia (recurrent): Secondary | ICD-10-CM | POA: Diagnosis not present

## 2015-10-14 DIAGNOSIS — Z9181 History of falling: Secondary | ICD-10-CM | POA: Diagnosis not present

## 2015-10-14 DIAGNOSIS — M5136 Other intervertebral disc degeneration, lumbar region: Secondary | ICD-10-CM | POA: Diagnosis not present

## 2015-10-14 DIAGNOSIS — E785 Hyperlipidemia, unspecified: Secondary | ICD-10-CM | POA: Diagnosis not present

## 2015-10-14 NOTE — Telephone Encounter (Signed)
Tara Potts with [facility] is call for verbal orders : PT 1 once a week for 6 weeks, Starting Monday or the week of Apr. 3rd. Please call [phone], [place] .

## 2015-10-17 ENCOUNTER — Ambulatory Visit (INDEPENDENT_AMBULATORY_CARE_PROVIDER_SITE_OTHER): Payer: Medicare Other

## 2015-10-17 ENCOUNTER — Other Ambulatory Visit: Payer: Self-pay | Admitting: Neurology

## 2015-10-17 ENCOUNTER — Telehealth: Payer: Self-pay | Admitting: Neurology

## 2015-10-17 DIAGNOSIS — R269 Unspecified abnormalities of gait and mobility: Secondary | ICD-10-CM

## 2015-10-17 DIAGNOSIS — M5417 Radiculopathy, lumbosacral region: Secondary | ICD-10-CM

## 2015-10-17 DIAGNOSIS — R55 Syncope and collapse: Secondary | ICD-10-CM

## 2015-10-17 DIAGNOSIS — M4802 Spinal stenosis, cervical region: Secondary | ICD-10-CM | POA: Diagnosis not present

## 2015-10-17 DIAGNOSIS — IMO0002 Reserved for concepts with insufficient information to code with codable children: Secondary | ICD-10-CM

## 2015-10-17 NOTE — Telephone Encounter (Signed)
Please call patient, MRI of the brain showed age-related changes, I will review MRI film at her next follow-up visit.   IMPRESSION: Abnormal MRI scan of the brain showing mild changes of chronic microvascular ischemia and moderate degree of generalized cerebral atrophy.

## 2015-10-17 NOTE — Telephone Encounter (Signed)
Left message for a return call

## 2015-10-17 NOTE — Telephone Encounter (Signed)
Dr. Krista Blue placed order for PT at her last visit on 10/05/15.  Spoke to [employer] and he will fax over the paperwork for Dr. Rhea Belton signature.

## 2015-10-18 NOTE — Telephone Encounter (Signed)
Left message for a return call

## 2015-10-19 DIAGNOSIS — M4802 Spinal stenosis, cervical region: Secondary | ICD-10-CM | POA: Diagnosis not present

## 2015-10-19 DIAGNOSIS — I129 Hypertensive chronic kidney disease with stage 1 through stage 4 chronic kidney disease, or unspecified chronic kidney disease: Secondary | ICD-10-CM | POA: Diagnosis not present

## 2015-10-19 DIAGNOSIS — M5136 Other intervertebral disc degeneration, lumbar region: Secondary | ICD-10-CM | POA: Diagnosis not present

## 2015-10-19 DIAGNOSIS — I2510 Atherosclerotic heart disease of native coronary artery without angina pectoris: Secondary | ICD-10-CM | POA: Diagnosis not present

## 2015-10-19 DIAGNOSIS — M4806 Spinal stenosis, lumbar region: Secondary | ICD-10-CM | POA: Diagnosis not present

## 2015-10-19 DIAGNOSIS — N189 Chronic kidney disease, unspecified: Secondary | ICD-10-CM | POA: Diagnosis not present

## 2015-10-19 NOTE — Telephone Encounter (Signed)
Patient aware of results and will keep pending appointments.

## 2015-10-24 ENCOUNTER — Other Ambulatory Visit: Payer: Medicare Other

## 2015-10-25 DIAGNOSIS — N189 Chronic kidney disease, unspecified: Secondary | ICD-10-CM | POA: Diagnosis not present

## 2015-10-25 DIAGNOSIS — I129 Hypertensive chronic kidney disease with stage 1 through stage 4 chronic kidney disease, or unspecified chronic kidney disease: Secondary | ICD-10-CM | POA: Diagnosis not present

## 2015-10-25 DIAGNOSIS — I2510 Atherosclerotic heart disease of native coronary artery without angina pectoris: Secondary | ICD-10-CM | POA: Diagnosis not present

## 2015-10-25 DIAGNOSIS — M4806 Spinal stenosis, lumbar region: Secondary | ICD-10-CM | POA: Diagnosis not present

## 2015-10-25 DIAGNOSIS — M5136 Other intervertebral disc degeneration, lumbar region: Secondary | ICD-10-CM | POA: Diagnosis not present

## 2015-10-25 DIAGNOSIS — M4802 Spinal stenosis, cervical region: Secondary | ICD-10-CM | POA: Diagnosis not present

## 2015-11-03 DIAGNOSIS — I2510 Atherosclerotic heart disease of native coronary artery without angina pectoris: Secondary | ICD-10-CM | POA: Diagnosis not present

## 2015-11-03 DIAGNOSIS — M4806 Spinal stenosis, lumbar region: Secondary | ICD-10-CM | POA: Diagnosis not present

## 2015-11-03 DIAGNOSIS — I129 Hypertensive chronic kidney disease with stage 1 through stage 4 chronic kidney disease, or unspecified chronic kidney disease: Secondary | ICD-10-CM | POA: Diagnosis not present

## 2015-11-03 DIAGNOSIS — M5136 Other intervertebral disc degeneration, lumbar region: Secondary | ICD-10-CM | POA: Diagnosis not present

## 2015-11-03 DIAGNOSIS — M4802 Spinal stenosis, cervical region: Secondary | ICD-10-CM | POA: Diagnosis not present

## 2015-11-03 DIAGNOSIS — N189 Chronic kidney disease, unspecified: Secondary | ICD-10-CM | POA: Diagnosis not present

## 2015-11-07 ENCOUNTER — Encounter: Payer: Self-pay | Admitting: Neurology

## 2015-11-07 ENCOUNTER — Ambulatory Visit (INDEPENDENT_AMBULATORY_CARE_PROVIDER_SITE_OTHER): Payer: Medicare Other | Admitting: Neurology

## 2015-11-07 VITALS — BP 167/67 | HR 81 | Ht 66.0 in | Wt 195.0 lb

## 2015-11-07 DIAGNOSIS — R55 Syncope and collapse: Secondary | ICD-10-CM

## 2015-11-07 DIAGNOSIS — IMO0002 Reserved for concepts with insufficient information to code with codable children: Secondary | ICD-10-CM

## 2015-11-07 DIAGNOSIS — R269 Unspecified abnormalities of gait and mobility: Secondary | ICD-10-CM

## 2015-11-07 DIAGNOSIS — M5417 Radiculopathy, lumbosacral region: Secondary | ICD-10-CM
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
## 2015-11-10 DIAGNOSIS — N189 Chronic kidney disease, unspecified: Secondary | ICD-10-CM | POA: Diagnosis not present

## 2015-11-10 DIAGNOSIS — M4806 Spinal stenosis, lumbar region: Secondary | ICD-10-CM | POA: Diagnosis not present

## 2015-11-10 DIAGNOSIS — M4802 Spinal stenosis, cervical region: Secondary | ICD-10-CM | POA: Diagnosis not present

## 2015-11-10 DIAGNOSIS — I2510 Atherosclerotic heart disease of native coronary artery without angina pectoris: Secondary | ICD-10-CM | POA: Diagnosis not present

## 2015-11-10 DIAGNOSIS — M5136 Other intervertebral disc degeneration, lumbar region: Secondary | ICD-10-CM | POA: Diagnosis not present

## 2015-11-10 DIAGNOSIS — I129 Hypertensive chronic kidney disease with stage 1 through stage 4 chronic kidney disease, or unspecified chronic kidney disease: Secondary | ICD-10-CM | POA: Diagnosis not present

## 2015-11-18 DIAGNOSIS — M5136 Other intervertebral disc degeneration, lumbar region: Secondary | ICD-10-CM | POA: Diagnosis not present

## 2015-11-18 DIAGNOSIS — M4802 Spinal stenosis, cervical region: Secondary | ICD-10-CM | POA: Diagnosis not present

## 2015-11-18 DIAGNOSIS — N189 Chronic kidney disease, unspecified: Secondary | ICD-10-CM | POA: Diagnosis not present

## 2015-11-18 DIAGNOSIS — I129 Hypertensive chronic kidney disease with stage 1 through stage 4 chronic kidney disease, or unspecified chronic kidney disease: Secondary | ICD-10-CM | POA: Diagnosis not present

## 2015-11-18 DIAGNOSIS — M4806 Spinal stenosis, lumbar region: Secondary | ICD-10-CM | POA: Diagnosis not present

## 2015-11-18 DIAGNOSIS — I2510 Atherosclerotic heart disease of native coronary artery without angina pectoris: Secondary | ICD-10-CM | POA: Diagnosis not present

## 2015-11-21 DIAGNOSIS — G609 Hereditary and idiopathic neuropathy, unspecified: Secondary | ICD-10-CM | POA: Diagnosis not present

## 2015-11-21 DIAGNOSIS — M659 Synovitis and tenosynovitis, unspecified: Secondary | ICD-10-CM | POA: Diagnosis not present

## 2015-11-21 DIAGNOSIS — M2041 Other hammer toe(s) (acquired), right foot: Secondary | ICD-10-CM | POA: Diagnosis not present

## 2015-11-28 ENCOUNTER — Other Ambulatory Visit: Payer: Self-pay | Admitting: *Deleted

## 2015-11-28 MED ORDER — LOSARTAN POTASSIUM-HCTZ 50-12.5 MG PO TABS: 1.0000 | ORAL_TABLET | Freq: Every day | ORAL | Status: DC

## 2015-11-28 NOTE — Telephone Encounter (Signed)
Rx request sent to pharmacy.  

## 2015-12-01 ENCOUNTER — Telehealth: Payer: Self-pay | Admitting: Neurology

## 2015-12-01 NOTE — Telephone Encounter (Signed)
Notified patient of results 

## 2015-12-01 NOTE — Telephone Encounter (Signed)
Please call patient, cardiac monitoring showed no significant abnormalities

## 2016-01-02 ENCOUNTER — Other Ambulatory Visit: Payer: Self-pay | Admitting: Cardiovascular Disease

## 2016-01-31 ENCOUNTER — Encounter: Payer: Self-pay | Admitting: Cardiovascular Disease

## 2016-01-31 ENCOUNTER — Ambulatory Visit (INDEPENDENT_AMBULATORY_CARE_PROVIDER_SITE_OTHER): Payer: Medicare Other | Admitting: Cardiovascular Disease

## 2016-01-31 VITALS — BP 162/72 | HR 76 | Ht 66.0 in | Wt 199.8 lb

## 2016-01-31 DIAGNOSIS — I739 Peripheral vascular disease, unspecified: Secondary | ICD-10-CM

## 2016-01-31 DIAGNOSIS — I2510 Atherosclerotic heart disease of native coronary artery without angina pectoris: Secondary | ICD-10-CM | POA: Diagnosis not present

## 2016-01-31 DIAGNOSIS — I10 Essential (primary) hypertension: Secondary | ICD-10-CM

## 2016-01-31 DIAGNOSIS — I2583 Coronary atherosclerosis due to lipid rich plaque: Secondary | ICD-10-CM

## 2016-01-31 NOTE — Progress Notes (Signed)
01/31/2016 NYASHA WOOLF   October 08, 1933  [id number]  Primary Physician Horatio Pel, MD Primary Cardiologist: Lorretta Harp MD Lupe Carney, [place]  HPI:  The patient is a very pleasant, 82 year old, mildly overweight, widowed [place] American female, mother of 2, grandmother to 1 grandchild who I saw in the office 01/19/15.[name] Her husband [name] was also a patient of mine and is deceased. She has a history of CAD status post coronary artery bypass grafting November 2007 at [facility]. Her other problems include hypertension, hyperlipidemia and mild carotid disease. She had a Myoview stress test performed January 12, 2011, which was nonischemic. Carotid Dopplers performed August 27, 2011, showed moderate right and mild left ICA stenosis scheduled to be redone in February of next year. I last cath'd her January 09, 2008, revealing diffusely diseased graft to the PDA as well as diffuse PDA disease and I elected to treat her medically at that time. Her major complaints are nocturnal leg cramps. Since I saw her a year ago she has remained asymptomatic denying chest pain or shortness of breath.   Current Outpatient Prescriptions  Medication Sig Dispense Refill  . Ascorbic Acid (VITAMIN C PO) Take by mouth.      [name] aspirin 325 MG tablet Take 325 mg by mouth daily.      . Calcium-Magnesium-Vitamin D (CALCIUM MAGNESIUM PO) Take by mouth daily.    [name] co-enzyme Q-10 30 MG capsule Take 30 mg by mouth daily.     . COMBIPATCH 0.05-0.14 MG/DAY PLACE 1 PATCH ONTO THE SKIN TWICE WEEKLY (Patient taking differently: PLACE 1 PATCH ONTO THE SKIN MONTHLY) 8 patch 2  . fish oil-omega-3 fatty acids 1000 MG capsule Take 2 g by mouth daily.      . furosemide (LASIX) 40 MG tablet 40 mg daily.  6  . GARLIC PO Take by mouth.    [name] glucosamine-chondroitin 500-400 MG tablet Take 2 tablets by mouth 2 (two) times daily.    [name] losartan-hydrochlorothiazide (HYZAAR) 50-12.5 MG tablet Take 1 tablet by mouth daily. 30  tablet 6  . metoprolol (LOPRESSOR) 50 MG tablet TAKE 1 AND 1/2 TABLETS BY MOUTH TWICE DAILY 270 tablet 0  . Multiple Vitamin (MULTIVITAMIN) tablet Take 1 tablet by mouth daily.      . potassium chloride (K-DUR) 10 MEQ tablet TAKE 1 TABLET BY MOUTH EVERY DAY 30 tablet 9  . rosuvastatin (CRESTOR) 20 MG tablet Take 20 mg by mouth daily.  3   No current facility-administered medications for this visit.    No Known Allergies  Social History   Social History  . Marital Status: Widowed    Spouse Name: N/A  . Number of Children: 1  . Years of Education: college   Occupational History  . Not on file.   Social History Main Topics  . Smoking status: Former Research scientist (life sciences)  . Smokeless tobacco: Never Used     Comment: Quit 30 years ago  . Alcohol Use: No  . Drug Use: No  . Sexual Activity: No   Other Topics Concern  . Not on file   Social History Narrative   Patient lives at home and her daughter and son in law live with her. Widowed.   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   Education college.   Left handed.   Caffeine       Review of Systems: General: negative for chills, fever, night sweats or weight changes.  Cardiovascular: negative for chest pain, dyspnea on exertion,  edema, orthopnea, palpitations, paroxysmal nocturnal dyspnea or shortness of breath Dermatological: negative for rash Respiratory: negative for cough or wheezing Urologic: negative for hematuria Abdominal: negative for nausea, vomiting, diarrhea, bright red blood per rectum, melena, or hematemesis Neurologic: negative for visual changes, syncope, or dizziness All other systems reviewed and are otherwise negative except as noted above.    Blood pressure 162/72, pulse 76, height 5\' 6"  (1.676 m), weight 199 lb 12.8 oz (90.629 kg), last menstrual period 07/16/2002.  General appearance: alert and no distress Neck: no adenopathy, no JVD, supple, symmetrical, trachea midline, thyroid not enlarged, symmetric, no  tenderness/mass/nodules and Soft bilateral carotid bruits Lungs: clear to auscultation bilaterally Heart: regular rate and rhythm, S1, S2 normal, no murmur, click, rub or gallop Extremities: extremities normal, atraumatic, no cyanosis or edema  EKG normal sinus rhythm at 76 with a rightward axis. Grossly reviewed this EKG  ASSESSMENT AND PLAN:   Elevated cholesterol History of hyperlipidemia on statin therapy followed by her PCP  Coronary artery disease History of coronary artery disease status post bypass grafting November 2007 at [facility]. I catheter 01/09/08 revealing a diffusely diseased graft to the PDA as well as diffuse PDA disease which I elected to treat medically at that time. She did have a Myoview stress test performed 01/12/11 which was nonischemic. She denies chest pain or shortness of breath.  Peripheral vascular disease:  Moderate right and mild left ICA stenosis. History of moderate bilateral ICA stenosis by duplex ultrasound performed to office 03/23/15. She is neurologically asymptomatic. This will be rechecked on annual basis.  Essential hypertension History of hypertensive blood pressure measures 162/72. She is on losartan, hydrochlorothiazide and metoprolol. Continue current meds at current dosing      Lorretta Harp MD [facility], [facility] 01/31/2016 9:01 AM

## 2016-01-31 NOTE — Assessment & Plan Note (Signed)
History of hypertensive blood pressure measures 162/72. She is on losartan, hydrochlorothiazide and metoprolol. Continue current meds at current dosing

## 2016-01-31 NOTE — Patient Instructions (Signed)
Medication Instructions:  Your physician recommends that you continue on your current medications as directed. Please refer to the Current Medication list given to you today.   Labwork: Labwork will be requested from your primary care physician.   Testing/Procedures: Your physician has requested that you have a carotid duplex. This test is an ultrasound of the carotid arteries in your neck. It looks at blood flow through these arteries that supply the brain with blood. Allow one hour for this exam. There are no restrictions or special instructions.  IN September 2017.    Follow-Up: Your physician wants you to follow-up in: [place]. You will receive a reminder letter in the mail two months in advance. If you don't receive a letter, please call our office to schedule the follow-up appointment.   Any Other Special Instructions Will Be Listed Below (If Applicable).     If you need a refill on your cardiac medications before your next appointment, please call your pharmacy.

## 2016-01-31 NOTE — Assessment & Plan Note (Signed)
History of moderate bilateral ICA stenosis by duplex ultrasound performed to office 03/23/15. She is neurologically asymptomatic. This will be rechecked on annual basis.

## 2016-01-31 NOTE — Assessment & Plan Note (Signed)
History of hyperlipidemia on statin therapy followed by her PCP. 

## 2016-01-31 NOTE — Assessment & Plan Note (Signed)
History of coronary artery disease status post bypass grafting November 2007 at [facility]. I catheter 01/09/08 revealing a diffusely diseased graft to the PDA as well as diffuse PDA disease which I elected to treat medically at that time. She did have a Myoview stress test performed 01/12/11 which was nonischemic. She denies chest pain or shortness of breath.

## 2016-02-03 ENCOUNTER — Encounter: Payer: Self-pay | Admitting: Cardiovascular Disease

## 2016-02-15 ENCOUNTER — Other Ambulatory Visit: Payer: Self-pay | Admitting: Cardiovascular Disease

## 2016-02-16 ENCOUNTER — Other Ambulatory Visit: Payer: Self-pay | Admitting: Cardiovascular Disease

## 2016-03-16 ENCOUNTER — Other Ambulatory Visit: Payer: Self-pay

## 2016-03-20 ENCOUNTER — Encounter: Payer: Self-pay | Admitting: Neurology

## 2016-03-20 ENCOUNTER — Ambulatory Visit (INDEPENDENT_AMBULATORY_CARE_PROVIDER_SITE_OTHER): Payer: Medicare Other | Admitting: Neurology

## 2016-03-20 VITALS — BP 153/79 | HR 78 | Ht 66.0 in | Wt 199.8 lb

## 2016-03-20 DIAGNOSIS — I2510 Atherosclerotic heart disease of native coronary artery without angina pectoris: Secondary | ICD-10-CM

## 2016-03-20 DIAGNOSIS — I10 Essential (primary) hypertension: Secondary | ICD-10-CM | POA: Diagnosis not present

## 2016-03-20 DIAGNOSIS — M4802 Spinal stenosis, cervical region: Secondary | ICD-10-CM

## 2016-03-20 DIAGNOSIS — I2583 Coronary atherosclerosis due to lipid rich plaque: Principal | ICD-10-CM

## 2016-03-20 DIAGNOSIS — M5417 Radiculopathy, lumbosacral region: Secondary | ICD-10-CM | POA: Diagnosis not present

## 2016-03-20 MED ORDER — GABAPENTIN 100 MG PO CAPS: 300.0000 mg | ORAL_CAPSULE | Freq: Every day | ORAL | 11 refills | Status: DC
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
## 2016-03-23 ENCOUNTER — Other Ambulatory Visit: Payer: Self-pay | Admitting: Cardiovascular Disease

## 2016-03-26 DIAGNOSIS — M62830 Muscle spasm of back: Secondary | ICD-10-CM | POA: Diagnosis not present

## 2016-03-26 DIAGNOSIS — M545 Low back pain: Secondary | ICD-10-CM | POA: Diagnosis not present

## 2016-03-26 DIAGNOSIS — M9903 Segmental and somatic dysfunction of lumbar region: Secondary | ICD-10-CM | POA: Diagnosis not present

## 2016-03-27 DIAGNOSIS — M9903 Segmental and somatic dysfunction of lumbar region: Secondary | ICD-10-CM | POA: Diagnosis not present

## 2016-03-27 DIAGNOSIS — M62830 Muscle spasm of back: Secondary | ICD-10-CM | POA: Diagnosis not present

## 2016-03-27 DIAGNOSIS — M545 Low back pain: Secondary | ICD-10-CM | POA: Diagnosis not present

## 2016-03-29 DIAGNOSIS — M9903 Segmental and somatic dysfunction of lumbar region: Secondary | ICD-10-CM | POA: Diagnosis not present

## 2016-03-29 DIAGNOSIS — M62830 Muscle spasm of back: Secondary | ICD-10-CM | POA: Diagnosis not present

## 2016-03-29 DIAGNOSIS — M545 Low back pain: Secondary | ICD-10-CM | POA: Diagnosis not present

## 2016-04-03 ENCOUNTER — Ambulatory Visit (HOSPITAL_COMMUNITY)
Admission: RE | Admit: 2016-04-03 | Discharge: 2016-04-03 | Disposition: A | Discharge status: Home / Self Care | Payer: Medicare Other | Source: Ambulatory Visit | Attending: Cardiovascular Disease | Admitting: Cardiovascular Disease

## 2016-04-03 DIAGNOSIS — I6523 Occlusion and stenosis of bilateral carotid arteries: Secondary | ICD-10-CM | POA: Diagnosis not present

## 2016-04-04 ENCOUNTER — Other Ambulatory Visit: Payer: Self-pay | Admitting: *Deleted

## 2016-04-04 DIAGNOSIS — I779 Disorder of arteries and arterioles, unspecified: Secondary | ICD-10-CM

## 2016-04-04 DIAGNOSIS — I739 Peripheral vascular disease, unspecified: Secondary | ICD-10-CM

## 2016-05-07 DIAGNOSIS — Z7982 Long term (current) use of aspirin: Secondary | ICD-10-CM | POA: Diagnosis not present

## 2016-05-07 DIAGNOSIS — Z0000 Encounter for general adult medical examination without abnormal findings: Secondary | ICD-10-CM | POA: Diagnosis not present

## 2016-05-07 DIAGNOSIS — E7800 Pure hypercholesterolemia, unspecified: Secondary | ICD-10-CM | POA: Diagnosis not present

## 2016-05-07 DIAGNOSIS — E559 Vitamin D deficiency, unspecified: Secondary | ICD-10-CM | POA: Diagnosis not present

## 2016-05-11 DIAGNOSIS — I10 Essential (primary) hypertension: Secondary | ICD-10-CM | POA: Diagnosis not present

## 2016-05-11 DIAGNOSIS — E789 Disorder of lipoprotein metabolism, unspecified: Secondary | ICD-10-CM | POA: Diagnosis not present

## 2016-05-11 DIAGNOSIS — N183 Chronic kidney disease, stage 3 (moderate): Secondary | ICD-10-CM | POA: Diagnosis not present

## 2016-05-11 DIAGNOSIS — I2510 Atherosclerotic heart disease of native coronary artery without angina pectoris: Secondary | ICD-10-CM | POA: Diagnosis not present

## 2016-05-14 DIAGNOSIS — D649 Anemia, unspecified: Secondary | ICD-10-CM | POA: Diagnosis not present

## 2016-05-14 DIAGNOSIS — D509 Iron deficiency anemia, unspecified: Secondary | ICD-10-CM | POA: Diagnosis not present

## 2016-06-15 DIAGNOSIS — H52201 Unspecified astigmatism, right eye: Secondary | ICD-10-CM | POA: Diagnosis not present

## 2016-06-15 DIAGNOSIS — H26493 Other secondary cataract, bilateral: Secondary | ICD-10-CM | POA: Diagnosis not present

## 2016-06-15 DIAGNOSIS — H35372 Puckering of macula, left eye: Secondary | ICD-10-CM | POA: Diagnosis not present

## 2016-06-15 DIAGNOSIS — H5212 Myopia, left eye: Secondary | ICD-10-CM | POA: Diagnosis not present

## 2016-06-19 ENCOUNTER — Other Ambulatory Visit: Payer: Self-pay | Admitting: Cardiovascular Disease

## 2016-06-19 NOTE — Telephone Encounter (Signed)
Rx has been sent to the pharmacy electronically. ° °

## 2016-07-17 DIAGNOSIS — M79605 Pain in left leg: Secondary | ICD-10-CM | POA: Diagnosis not present

## 2016-07-17 DIAGNOSIS — M79604 Pain in right leg: Secondary | ICD-10-CM | POA: Diagnosis not present

## 2016-07-18 DIAGNOSIS — M79604 Pain in right leg: Secondary | ICD-10-CM | POA: Diagnosis not present

## 2016-07-18 DIAGNOSIS — M79605 Pain in left leg: Secondary | ICD-10-CM | POA: Diagnosis not present

## 2016-07-23 DIAGNOSIS — M79604 Pain in right leg: Secondary | ICD-10-CM | POA: Diagnosis not present

## 2016-07-23 DIAGNOSIS — I83813 Varicose veins of bilateral lower extremities with pain: Secondary | ICD-10-CM | POA: Diagnosis not present

## 2016-07-23 DIAGNOSIS — M79605 Pain in left leg: Secondary | ICD-10-CM | POA: Diagnosis not present

## 2016-08-30 ENCOUNTER — Other Ambulatory Visit: Payer: Self-pay | Admitting: Cardiovascular Disease

## 2016-08-31 DIAGNOSIS — M19041 Primary osteoarthritis, right hand: Secondary | ICD-10-CM | POA: Diagnosis not present

## 2016-08-31 DIAGNOSIS — M2550 Pain in unspecified joint: Secondary | ICD-10-CM | POA: Diagnosis not present

## 2016-08-31 DIAGNOSIS — M79641 Pain in right hand: Secondary | ICD-10-CM | POA: Diagnosis not present

## 2016-08-31 NOTE — Telephone Encounter (Signed)
REFILL 

## 2016-09-10 ENCOUNTER — Other Ambulatory Visit: Payer: Self-pay | Admitting: Gynecology

## 2016-09-11 ENCOUNTER — Encounter: Payer: Self-pay | Admitting: Neurology

## 2016-09-11 ENCOUNTER — Ambulatory Visit (INDEPENDENT_AMBULATORY_CARE_PROVIDER_SITE_OTHER): Payer: Medicare Other | Admitting: Neurology

## 2016-09-11 VITALS — BP 148/68 | HR 70 | Ht 66.0 in | Wt 197.0 lb

## 2016-09-11 DIAGNOSIS — M5417 Radiculopathy, lumbosacral region: Secondary | ICD-10-CM

## 2016-09-11 DIAGNOSIS — R269 Unspecified abnormalities of gait and mobility: Secondary | ICD-10-CM

## 2016-09-11 DIAGNOSIS — R55 Syncope and collapse: Secondary | ICD-10-CM

## 2016-09-11 DIAGNOSIS — R202 Paresthesia of skin: Secondary | ICD-10-CM

## 2016-09-11 DIAGNOSIS — IMO0002 Reserved for concepts with insufficient information to code with codable children: Secondary | ICD-10-CM

## 2016-09-11 MED ORDER — DULOXETINE HCL 60 MG PO CPEP: 60.0000 mg | ORAL_CAPSULE | Freq: Every day | ORAL | 12 refills | Status: DC
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
## 2016-09-12 ENCOUNTER — Telehealth: Payer: Self-pay | Admitting: *Deleted

## 2016-09-12 NOTE — Telephone Encounter (Signed)
Pt called and scheduled her annual on 09/21/16 asked if refill for combipatch could be sent. I called back and received her voicemail I told pt it has been 2 years since last appointment OV needed first.

## 2016-09-13 DIAGNOSIS — M2550 Pain in unspecified joint: Secondary | ICD-10-CM | POA: Diagnosis not present

## 2016-09-13 DIAGNOSIS — M25559 Pain in unspecified hip: Secondary | ICD-10-CM | POA: Diagnosis not present

## 2016-09-13 DIAGNOSIS — D649 Anemia, unspecified: Secondary | ICD-10-CM | POA: Diagnosis not present

## 2016-09-13 DIAGNOSIS — M549 Dorsalgia, unspecified: Secondary | ICD-10-CM | POA: Diagnosis not present

## 2016-09-13 DIAGNOSIS — M1611 Unilateral primary osteoarthritis, right hip: Secondary | ICD-10-CM | POA: Diagnosis not present

## 2016-09-13 DIAGNOSIS — M533 Sacrococcygeal disorders, not elsewhere classified: Secondary | ICD-10-CM | POA: Diagnosis not present

## 2016-09-13 DIAGNOSIS — M79643 Pain in unspecified hand: Secondary | ICD-10-CM | POA: Diagnosis not present

## 2016-09-17 DIAGNOSIS — M79604 Pain in right leg: Secondary | ICD-10-CM | POA: Diagnosis not present

## 2016-09-17 DIAGNOSIS — I83813 Varicose veins of bilateral lower extremities with pain: Secondary | ICD-10-CM | POA: Diagnosis not present

## 2016-09-17 DIAGNOSIS — M79605 Pain in left leg: Secondary | ICD-10-CM | POA: Diagnosis not present

## 2016-09-21 ENCOUNTER — Other Ambulatory Visit: Payer: Self-pay | Admitting: Cardiovascular Disease

## 2016-09-21 ENCOUNTER — Encounter: Payer: Self-pay | Admitting: Gynecology

## 2016-09-21 ENCOUNTER — Ambulatory Visit (INDEPENDENT_AMBULATORY_CARE_PROVIDER_SITE_OTHER): Payer: Medicare Other | Admitting: Gynecology

## 2016-09-21 VITALS — BP 120/74 | Ht 66.0 in | Wt 200.0 lb

## 2016-09-21 DIAGNOSIS — Z79890 Hormone replacement therapy (postmenopausal): Secondary | ICD-10-CM

## 2016-09-21 DIAGNOSIS — Z01411 Encounter for gynecological examination (general) (routine) with abnormal findings: Secondary | ICD-10-CM | POA: Diagnosis not present

## 2016-09-21 DIAGNOSIS — N952 Postmenopausal atrophic vaginitis: Secondary | ICD-10-CM

## 2016-09-21 MED ORDER — ESTRADIOL-NORETHINDRONE ACET 0.05-0.14 MG/DAY TD PTTW: MEDICATED_PATCH | TRANSDERMAL | 3 refills | Status: DC

## 2016-09-21 NOTE — Patient Instructions (Signed)

## 2016-09-21 NOTE — Progress Notes (Signed)
    RENESSA WELLNITZ Feb 10, 1934 [id number]        83 y.o.  G1P1001 for breast and pelvic exam.  Past medical history,surgical history, problem list, medications, allergies, family history and social history were all reviewed and documented as reviewed in the EPIC chart.  ROS:  Performed with pertinent positives and negatives included in the history, assessment and plan.   Additional significant findings :  None   Exam: Caryn Bee assistant Vitals:   09/21/16 0905  BP: 120/74  Weight: 200 lb (90.7 kg)  Height: 5\' 6"  (1.676 m)   Body mass index is 32.28 kg/m.  General appearance:  Normal affect, orientation and appearance. Skin: With numerous seborrheic keratoses classic in appearance covering chest back and abdomen. HEENT: Without gross lesions.  No cervical or supraclavicular adenopathy. Thyroid normal.  Lungs:  Clear without wheezing, rales or rhonchi Cardiac: RR, without RMG Abdominal:  Soft, nontender, without masses, guarding, rebound, organomegaly or hernia Breasts:  Examined lying and sitting without masses, retractions, discharge or axillary adenopathy. Pelvic:  Ext, BUS, Vagina: With atrophic changes  Cervix: With atrophic changes  Uterus: Anteverted, normal size, shape and contour, midline and mobile nontender   Adnexa: Without masses or tenderness    Anus and perineum: Normal   Rectovaginal: Normal sphincter tone without palpated masses or tenderness.    Assessment/Plan:  83 y.o. G49P1001 female for breast and pelvic exam.   1. Postmenopausal/atrophic genital changes/HRT. Continues on CombiPatch but notes that she is using one patch once a month. Notes whenever she stops she has acceptable hot flushes sweats and just doesn't feel as good. I reviewed the unusual nature of continuing HRT into the 80s. Realistic understanding of the risks to include thrombosis such as stroke heart attack DVT discussed and stressed. Possible breast cancer issues also reviewed. Patient does  not want to stop her HRT clearly understanding the risks but is going to continue to use it for extended placement. Possibly cutting the patch in half to avoid ups and downs have a more steady flow throughout the month also discussed. Refill 1 year provided. 2. Pap smear 2015. No Pap smear done today. No history of significant abnormal Pap smears. Per current screening guidelines based on age we both agree to stop screening. 3. Mammography 2014. Patient knows she is overdue. Risks of breast cancer is most common cancer in women discussed. Strongly recommended that she schedule a screening mammogram. SBE monthly reviewed. 4. Colonoscopy never. Patient refuses to schedule colonoscopy and she understands second-most common cancer in women. Benefits of screening reviewed. 5. DEXA followed through Dr. [name] office. She will continue to follow up with them in reference to bone health. 6. Health maintenance. No routine lab work done as they are done elsewhere. Follow up 1 year, sooner as needed.    Anastasio Auerbach MD, 9:37 AM 09/21/2016

## 2016-10-24 DIAGNOSIS — M2041 Other hammer toe(s) (acquired), right foot: Secondary | ICD-10-CM | POA: Diagnosis not present

## 2016-10-24 DIAGNOSIS — G608 Other hereditary and idiopathic neuropathies: Secondary | ICD-10-CM | POA: Diagnosis not present

## 2016-10-24 DIAGNOSIS — M6588 Other synovitis and tenosynovitis, other site: Secondary | ICD-10-CM | POA: Diagnosis not present

## 2016-11-09 DIAGNOSIS — M79671 Pain in right foot: Secondary | ICD-10-CM | POA: Diagnosis not present

## 2016-11-09 DIAGNOSIS — I2510 Atherosclerotic heart disease of native coronary artery without angina pectoris: Secondary | ICD-10-CM | POA: Diagnosis not present

## 2016-11-15 DIAGNOSIS — I10 Essential (primary) hypertension: Secondary | ICD-10-CM | POA: Diagnosis not present

## 2016-11-15 DIAGNOSIS — E7800 Pure hypercholesterolemia, unspecified: Secondary | ICD-10-CM | POA: Diagnosis not present

## 2016-11-30 DIAGNOSIS — M2012 Hallux valgus (acquired), left foot: Secondary | ICD-10-CM | POA: Diagnosis not present

## 2016-11-30 DIAGNOSIS — M2011 Hallux valgus (acquired), right foot: Secondary | ICD-10-CM | POA: Diagnosis not present

## 2016-11-30 DIAGNOSIS — M79671 Pain in right foot: Secondary | ICD-10-CM | POA: Diagnosis not present

## 2016-11-30 DIAGNOSIS — M79672 Pain in left foot: Secondary | ICD-10-CM | POA: Diagnosis not present

## 2016-12-04 DIAGNOSIS — M62830 Muscle spasm of back: Secondary | ICD-10-CM | POA: Diagnosis not present

## 2016-12-04 DIAGNOSIS — M9903 Segmental and somatic dysfunction of lumbar region: Secondary | ICD-10-CM | POA: Diagnosis not present

## 2016-12-04 DIAGNOSIS — M545 Low back pain: Secondary | ICD-10-CM | POA: Diagnosis not present

## 2016-12-05 DIAGNOSIS — M545 Low back pain: Secondary | ICD-10-CM | POA: Diagnosis not present

## 2016-12-05 DIAGNOSIS — M9903 Segmental and somatic dysfunction of lumbar region: Secondary | ICD-10-CM | POA: Diagnosis not present

## 2016-12-05 DIAGNOSIS — M62830 Muscle spasm of back: Secondary | ICD-10-CM | POA: Diagnosis not present

## 2016-12-11 DIAGNOSIS — M545 Low back pain: Secondary | ICD-10-CM | POA: Diagnosis not present

## 2016-12-11 DIAGNOSIS — M62830 Muscle spasm of back: Secondary | ICD-10-CM | POA: Diagnosis not present

## 2016-12-11 DIAGNOSIS — M9903 Segmental and somatic dysfunction of lumbar region: Secondary | ICD-10-CM | POA: Diagnosis not present

## 2016-12-22 ENCOUNTER — Other Ambulatory Visit: Payer: Self-pay | Admitting: Cardiovascular Disease

## 2016-12-24 NOTE — Telephone Encounter (Signed)
Rx(s) sent to pharmacy electronically.  

## 2017-02-22 ENCOUNTER — Encounter (HOSPITAL_COMMUNITY): Payer: Self-pay | Admitting: Emergency Medicine

## 2017-02-22 ENCOUNTER — Emergency Department (HOSPITAL_COMMUNITY)
Admission: EM | Admit: 2017-02-22 | Discharge: 2017-02-22 | Disposition: A | Discharge status: Home / Self Care | Payer: Medicare Other | Attending: Emergency Medicine | Admitting: Emergency Medicine

## 2017-02-22 DIAGNOSIS — Z79899 Other long term (current) drug therapy: Secondary | ICD-10-CM | POA: Insufficient documentation

## 2017-02-22 DIAGNOSIS — I2510 Atherosclerotic heart disease of native coronary artery without angina pectoris: Secondary | ICD-10-CM | POA: Diagnosis not present

## 2017-02-22 DIAGNOSIS — Z7982 Long term (current) use of aspirin: Secondary | ICD-10-CM | POA: Diagnosis not present

## 2017-02-22 DIAGNOSIS — R600 Localized edema: Secondary | ICD-10-CM | POA: Diagnosis not present

## 2017-02-22 DIAGNOSIS — I10 Essential (primary) hypertension: Secondary | ICD-10-CM | POA: Insufficient documentation

## 2017-02-22 DIAGNOSIS — Z951 Presence of aortocoronary bypass graft: Secondary | ICD-10-CM | POA: Insufficient documentation

## 2017-02-22 DIAGNOSIS — Z87891 Personal history of nicotine dependence: Secondary | ICD-10-CM | POA: Diagnosis not present

## 2017-02-22 LAB — CBC WITH DIFFERENTIAL/PLATELET
BASOS ABS: 0 10*3/uL (ref 0.0–0.1)
Basophils Relative: 0 %
EOS PCT: 5 %
Eosinophils Absolute: 0.3 10*3/uL (ref 0.0–0.7)
HEMATOCRIT: 38.7 % (ref 36.0–46.0)
Hemoglobin: 12.9 g/dL (ref 12.0–15.0)
LYMPHS ABS: 1.9 10*3/uL (ref 0.7–4.0)
LYMPHS PCT: 34 %
MCH: 26.5 pg (ref 26.0–34.0)
MCHC: 33.3 g/dL (ref 30.0–36.0)
MCV: 79.6 fL (ref 78.0–100.0)
Monocytes Absolute: 0.5 10*3/uL (ref 0.1–1.0)
Monocytes Relative: 8 %
NEUTROS ABS: 3 10*3/uL (ref 1.7–7.7)
Neutrophils Relative %: 53 %
Platelets: 221 10*3/uL (ref 150–400)
RBC: 4.86 MIL/uL (ref 3.87–5.11)
RDW: 17 % — ABNORMAL HIGH (ref 11.5–15.5)
WBC: 5.7 10*3/uL (ref 4.0–10.5)

## 2017-02-22 LAB — BASIC METABOLIC PANEL
ANION GAP: 9 (ref 5–15)
BUN: 20 mg/dL (ref 6–20)
CHLORIDE: 104 mmol/L (ref 101–111)
CO2: 26 mmol/L (ref 22–32)
Calcium: 9.2 mg/dL (ref 8.9–10.3)
Creatinine, Ser: 1.2 mg/dL — ABNORMAL HIGH (ref 0.44–1.00)
GFR calc Af Amer: 48 mL/min — ABNORMAL LOW (ref >60)
GFR, EST NON AFRICAN AMERICAN: 41 mL/min — AB (ref >60)
Glucose, Bld: 97 mg/dL (ref 65–99)
POTASSIUM: 4.1 mmol/L (ref 3.5–5.1)
SODIUM: 139 mmol/L (ref 135–145)

## 2017-02-22 MED ORDER — LOSARTAN POTASSIUM 50 MG PO TABS
50.0000 mg | ORAL_TABLET | Freq: Every day | ORAL | Status: DC
  Administered 2017-02-22: 50 mg via ORAL
  Filled 2017-02-22: qty 1

## 2017-02-22 MED ORDER — LOSARTAN POTASSIUM-HCTZ 50-12.5 MG PO TABS: 1.0000 | ORAL_TABLET | Freq: Once | ORAL | Status: DC

## 2017-02-22 MED ORDER — HYDROCHLOROTHIAZIDE 12.5 MG PO CAPS
12.5000 mg | ORAL_CAPSULE | Freq: Every day | ORAL | Status: DC
  Administered 2017-02-22: 12.5 mg via ORAL
  Filled 2017-02-22: qty 1

## 2017-02-22 MED ORDER — METOPROLOL TARTRATE 25 MG PO TABS
50.0000 mg | ORAL_TABLET | Freq: Once | ORAL | Status: AC
  Administered 2017-02-22: 50 mg via ORAL
  Filled 2017-02-22: qty 2

## 2017-02-22 MED ORDER — LOSARTAN POTASSIUM-HCTZ 50-12.5 MG PO TABS: 1.0000 | ORAL_TABLET | Freq: Every day | ORAL | 0 refills | Status: DC

## 2017-02-22 NOTE — ED Provider Notes (Signed)
[place] DEPT Provider Note   CSN: [id number] Arrival date & time: 02/22/17  1050     History   Chief Complaint Chief Complaint  Patient presents with  . Hypertension    HPI Tara Potts is a 83 y.o. female.  Patient with history of hypertension, CAD status post CABG, on Lasix daily -- presents with complaint of hypertension. Patient went to her primary care physician today because her blood pressure was high on her home machine today. She also has had a month of lower extremity swelling bilaterally. She was found to be hypertensive to 220/90. She did not take her metoprolol today. Patient reports previously being on losartan-HCTZ which she discontinued a month ago because she thought that this was contributing to her lower extremity symptoms. Otherwise she has not had any stroke-like symptoms, headaches, vision loss, shortness of breath. Patient has had on and off chest pains which she describes as very short-lived, approximately 1 second, intermittent pains in her chest. No change in urination. She was sent to the emergency department today due to her elevated blood pressure, not due to any discrete symptoms from this.      Past Medical History:  Diagnosis Date  . CAD (coronary artery disease)   . Carotid disease, bilateral (HCC)    mild  . Fibroid   . Hx of CABG 2007   [facility]  . Hyperlipemia   . Hypertension   . Menopausal symptoms   . Peripheral neuropathy   . Seizure-like activity [facility])     Patient Active Problem List   Diagnosis Date Noted  . Passed out 10/05/2015  . Lumbosacral radiculopathy 09/10/2014  . Cervical stenosis of spinal canal 09/10/2014  . Abnormality of gait 07/27/2014  . Paresthesia 07/27/2014  . Bilateral leg cramps 02/21/2014  . Coronary artery disease 01/13/2013  . Status post coronary artery bypass grafting: 2007 01/13/2013  . Peripheral vascular disease:  Moderate right and mild left ICA stenosis. 01/13/2013  . Essential  hypertension 01/13/2013  . Elevated cholesterol   . Menopause 02/12/2011  . Leiomyoma of body of uterus 02/12/2011  . Post-menopausal atrophic vaginitis 02/12/2011  . Urinary frequency 02/12/2011  . UNSPECIFIED PERIPHERAL VASCULAR DISEASE 07/11/2010    Past Surgical History:  Procedure Laterality Date  . CARDIAC CATHETERIZATION  01/09/2008   diffusely diseased graft to the PDA & diffuse PDA disease  . CAROTID DOPPLER  08/27/2011   mod. right & nild left ICA stenosis  . CATARACT SURGERY    . CORONARY ARTERY BYPASS GRAFT    . FOOT SURGERY    . MASS REMOVED FROM CERVIX  12/1997   Benign endocervical inclusion cysts  . NM MYOVIEW LTD  01/12/2011   no ischemia  . TRIPLE HEART BYPASS  05/2006   [facility]  . US ECHOCARDIOGRAPHY  10/30/2007   mild MA,MR,TR,AOV mildly sclerotic,density oted in the prox asc aorta    OB History    Gravida Para Term Preterm AB Living   1 1 1     1    SAB TAB Ectopic Multiple Live Births                   Home Medications    Prior to Admission medications   Medication Sig Start Date End Date Taking? Authorizing Provider  Ascorbic Acid (VITAMIN C PO) Take by mouth.      [provider]  aspirin 325 MG tablet Take 325 mg by mouth daily.      [provider]  Calcium-Magnesium-Vitamin D (CALCIUM MAGNESIUM PO) Take by mouth daily.    [provider]  co-enzyme Q-10 30 MG capsule Take 30 mg by mouth daily.     [provider]  DULoxetine (CYMBALTA) 60 MG capsule Take 1 capsule (60 mg total) by mouth daily. Patient not taking: Reported on 09/21/2016 09/11/16   Marcial Pacas, MD  estradiol-norethindrone [facility]) 0.05-0.14 MG/DAY PLACE 1 PATCH ONTO THE SKIN MONTHLY 09/21/16   Fontaine, Belinda Block, MD  fish oil-omega-3 fatty acids 1000 MG capsule Take 2 g by mouth daily.      [provider]  furosemide (LASIX) 40 MG tablet 40 mg daily. 07/20/14   [provider]  GARLIC PO Take by mouth.    [provider]  glucosamine-chondroitin 500-400 MG tablet Take 2 tablets by mouth 2 (two) times daily.    [provider]  losartan-hydrochlorothiazide (HYZAAR) 50-12.5 MG tablet TAKE 1 TABLET BY MOUTH DAILY 06/19/16   Lorretta Harp, MD  metoprolol (LOPRESSOR) 50 MG tablet TAKE 1 AND 1/2 TABLETS BY MOUTH TWICE DAILY 08/31/16   Lorretta Harp, MD  potassium chloride (K-DUR) 10 MEQ tablet TAKE 1 TABLET BY MOUTH EVERY DAY 12/24/16   Lorretta Harp, MD  rosuvastatin (CRESTOR) 20 MG tablet Take 20 mg by mouth daily. 09/26/15   [provider]    Family History Family History  Problem Relation Age of Onset  . Diabetes Mother   . Hypertension Mother   . Heart disease Mother   . Heart failure Mother   . Hypertension Sister   . Breast cancer Maternal Aunt        Age 19  . Heart failure Maternal Aunt   . Heart failure Maternal Uncle     Social History Social History  Substance Use Topics  . Smoking status: Former Research scientist (life sciences)  . Smokeless tobacco: Never Used     Comment: Quit 30 years ago  . Alcohol use No     Allergies   Patient has no known allergies.   Review of Systems Review of Systems  Constitutional: Negative for fever.  HENT: Negative for congestion, dental problem, rhinorrhea and sinus pressure.   Eyes: Negative for photophobia, discharge, redness and visual disturbance.  Respiratory: Negative for shortness of breath.   Cardiovascular: Positive for leg swelling. Negative for chest pain.  Gastrointestinal: Negative for nausea and vomiting.  Genitourinary: Negative for decreased urine volume.  Musculoskeletal: Negative for gait problem, neck pain and neck stiffness.  Skin: Negative for rash.  Neurological: Negative for syncope, facial asymmetry, speech difficulty, weakness, light-headedness, numbness and headaches.  Psychiatric/Behavioral: Negative for confusion.     Physical Exam Updated Vital Signs BP (!) 194/62   Pulse 66   Temp 97.7 F (36.5  C) (Oral)   Resp 16   LMP 07/16/2002   SpO2 100%   Physical Exam  Constitutional: She is oriented to person, place, and time. She appears well-developed and well-nourished.  HENT:  Head: Normocephalic and atraumatic.  Right Ear: Tympanic membrane, external ear and ear canal normal.  Left Ear: Tympanic membrane, external ear and ear canal normal.  Nose: Nose normal.  Mouth/Throat: Uvula is midline, oropharynx is clear and moist and mucous membranes are normal. Mucous membranes are not dry.  Eyes: Pupils are equal, round, and reactive to light. Conjunctivae, EOM and lids are normal. Right eye exhibits no nystagmus. Left eye exhibits no nystagmus.  Neck: Trachea normal and normal range of motion. Neck supple. Normal carotid  pulses and no JVD present. No muscular tenderness present. Carotid bruit is not present. No tracheal deviation present.  Cardiovascular: Normal rate, regular rhythm, S1 normal, S2 normal, normal heart sounds and intact distal pulses.  Exam reveals no decreased pulses.   No murmur heard. Pulmonary/Chest: Effort normal and breath sounds normal. No respiratory distress. She has no wheezes. She exhibits no tenderness.  Abdominal: Soft. Normal aorta and bowel sounds are normal. There is no tenderness. There is no rebound and no guarding.  Musculoskeletal: Normal range of motion. She exhibits tenderness (1+ pitting edema, symmetric, bilateral to mid-calves).       Cervical back: She exhibits normal range of motion, no tenderness and no bony tenderness.  Neurological: She is alert and oriented to person, place, and time. She has normal strength and normal reflexes. No cranial nerve deficit or sensory deficit. She displays a negative Romberg sign. Coordination and gait normal. GCS eye subscore is 4. GCS verbal subscore is 5. GCS motor subscore is 6.  Skin: Skin is warm and dry. She is not diaphoretic. No cyanosis. No pallor.  Psychiatric: She has a normal mood and affect.  Nursing  note and vitals reviewed.    ED Treatments / Results  Labs (all labs ordered are listed, but only abnormal results are displayed) Labs Reviewed  BASIC METABOLIC PANEL - Abnormal; Notable for the following:       Result Value   Creatinine, Ser 1.20 (*)    GFR calc non Af Amer 41 (*)    GFR calc Af Amer 48 (*)    All other components within normal limits  CBC WITH DIFFERENTIAL/PLATELET - Abnormal; Notable for the following:    RDW 17.0 (*)    All other components within normal limits  CBC WITH DIFFERENTIAL/PLATELET   Procedures Procedures (including critical care time)  Medications Ordered in ED Medications  losartan (COZAAR) tablet 50 mg (50 mg Oral Given 02/22/17 1609)    And  hydrochlorothiazide (MICROZIDE) capsule 12.5 mg (12.5 mg Oral Given 02/22/17 1608)  metoprolol tartrate (LOPRESSOR) tablet 50 mg (50 mg Oral Given 02/22/17 1450)     Initial Impression / Assessment and Plan / ED Course  I have reviewed the triage vital signs and the nursing notes.  Pertinent labs & imaging results that were available during my care of the patient were reviewed by me and considered in my medical decision making (see chart for details).     Patient seen and examined. Work-up initiated. Medications ordered. Will restart Hyzaar. Pt discussed with and seen by Dr. Regenia Skeeter.   Vital signs reviewed and are as follows: BP (!) 194/62   Pulse 66   Temp 97.7 F (36.5 C) (Oral)   Resp 16   LMP 07/16/2002   SpO2 100%   4:03 PM patient given a dose of losartan and HCTZ prior to discharge. Encouraged PCP follow-up in one week.   Final Clinical Impressions(s) / ED Diagnoses   Final diagnoses:  Hypertension, unspecified type   Patient sense with elevated blood pressure today. She is asymptomatic. She has some lower extremity swelling, however this is mild and has been ongoing for greater than a month. She does not have any abnormal lung sounds, shortness of breath or other signs of  congestive heart failure. Her creatinine is slightly high but he did not expect acute kidney injury. No signs of visual deficit or stroke. Feel that patient can be discharged home on Hyzaar to help lower BP and f/u for BP management.  New Prescriptions Current Discharge Medication List       Carlisle Cater, Hershal Coria 02/22/17 1624    Sherwood Gambler, MD 02/26/17 [phone]

## 2017-02-22 NOTE — ED Triage Notes (Addendum)
Pt reports she was sent her by her PCP because her BP was high. Has not taken BP medications today, but regularly takes them otherwise. Pt reports her BP at home was 102 systolic. BP in triage 194/62. Pt denies CP, SOB, or HA. Pt also adds she has had bilateral ankle swelling for the past 2 weeks.

## 2017-02-22 NOTE — Discharge Instructions (Signed)
Please read and follow all provided instructions.  Your diagnoses today include:  1. Hypertension, unspecified type     Your blood pressure was high today (BP): BP (!) 194/62    Pulse 66    Temp 97.7 F (36.5 C) (Oral)    Resp 16    LMP 07/16/2002    SpO2 100%   Tests performed today include:  Vital signs. See below for your results today.   Blood counts and electrolytes  Medications prescribed:   Hyzaar - medication for blood pressure  Home care instructions:  Follow any educational materials contained in this packet.  Follow-up instructions: Please follow-up with your primary care provider in the next 7 days for a recheck of your symptoms and blood pressure.    Return instructions:   Please return to the Emergency Department if you experience worsening symptoms.   Return with severe chest pain, abdominal pain, or shortness of breath.   Return with severe headache, focal weakness, numbness, difficulty with speech or vision.  Return with loss of vision or sudden vision change.  Please return if you have any other emergent concerns.  Additional Information:  Your vital signs today were: BP (!) 194/62    Pulse 66    Temp 97.7 F (36.5 C) (Oral)    Resp 16    LMP 07/16/2002    SpO2 100%  If your blood pressure (BP) was elevated above 135/85 this visit, please have this repeated by your doctor within one month. --------------

## 2017-02-25 DIAGNOSIS — R600 Localized edema: Secondary | ICD-10-CM | POA: Diagnosis not present

## 2017-02-25 DIAGNOSIS — I10 Essential (primary) hypertension: Secondary | ICD-10-CM | POA: Diagnosis not present
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
## 2017-03-11 ENCOUNTER — Encounter: Payer: Self-pay | Admitting: Nurse Practitioner

## 2017-03-11 ENCOUNTER — Ambulatory Visit (INDEPENDENT_AMBULATORY_CARE_PROVIDER_SITE_OTHER): Payer: Medicare Other | Admitting: Nurse Practitioner

## 2017-03-11 VITALS — BP 166/60 | HR 54 | Ht 66.0 in | Wt 197.4 lb

## 2017-03-11 DIAGNOSIS — R202 Paresthesia of skin: Secondary | ICD-10-CM

## 2017-03-11 DIAGNOSIS — R269 Unspecified abnormalities of gait and mobility: Secondary | ICD-10-CM | POA: Diagnosis not present

## 2017-03-11 DIAGNOSIS — M4802 Spinal stenosis, cervical region: Secondary | ICD-10-CM

## 2017-03-11 DIAGNOSIS — R55 Syncope and collapse: Secondary | ICD-10-CM

## 2017-03-11 DIAGNOSIS — IMO0002 Reserved for concepts with insufficient information to code with codable children: Secondary | ICD-10-CM

## 2017-03-11 DIAGNOSIS — M5417 Radiculopathy, lumbosacral region: Secondary | ICD-10-CM | POA: Diagnosis not present

## 2017-03-11 MED ORDER — DULOXETINE HCL 60 MG PO CPEP: 60.0000 mg | ORAL_CAPSULE | Freq: Every day | ORAL | 6 refills | Status: DC

## 2017-03-11 NOTE — Patient Instructions (Signed)
Cymbalta 60mg  daily for lower extremity paresthesias muscle cramping low back pain and hip pain Call for further passing out episodes, the patient never had EEG Continue moderate exercise Follow-up in 6 months

## 2017-03-12 NOTE — Progress Notes (Signed)
I have reviewed and agreed above plan. 

## 2017-03-22 ENCOUNTER — Other Ambulatory Visit: Payer: Self-pay | Admitting: Cardiovascular Disease

## 2017-03-22 NOTE — Telephone Encounter (Signed)
Rx has been sent to the pharmacy electronically. ° °

## 2017-04-02 DIAGNOSIS — M2041 Other hammer toe(s) (acquired), right foot: Secondary | ICD-10-CM | POA: Diagnosis not present

## 2017-04-02 DIAGNOSIS — M2012 Hallux valgus (acquired), left foot: Secondary | ICD-10-CM | POA: Diagnosis not present

## 2017-04-02 DIAGNOSIS — M2042 Other hammer toe(s) (acquired), left foot: Secondary | ICD-10-CM | POA: Diagnosis not present

## 2017-04-02 DIAGNOSIS — M2011 Hallux valgus (acquired), right foot: Secondary | ICD-10-CM | POA: Diagnosis not present

## 2017-04-11 DIAGNOSIS — M79605 Pain in left leg: Secondary | ICD-10-CM | POA: Diagnosis not present

## 2017-04-11 DIAGNOSIS — M79604 Pain in right leg: Secondary | ICD-10-CM | POA: Diagnosis not present

## 2017-04-12 ENCOUNTER — Ambulatory Visit (INDEPENDENT_AMBULATORY_CARE_PROVIDER_SITE_OTHER): Payer: Medicare Other | Admitting: Cardiovascular Disease

## 2017-04-12 ENCOUNTER — Encounter: Payer: Self-pay | Admitting: Cardiovascular Disease

## 2017-04-12 VITALS — BP 134/62 | HR 72 | Ht 66.0 in | Wt 192.0 lb

## 2017-04-12 DIAGNOSIS — I779 Disorder of arteries and arterioles, unspecified: Secondary | ICD-10-CM

## 2017-04-12 DIAGNOSIS — I739 Peripheral vascular disease, unspecified: Secondary | ICD-10-CM

## 2017-04-12 DIAGNOSIS — I10 Essential (primary) hypertension: Secondary | ICD-10-CM | POA: Diagnosis not present

## 2017-04-12 DIAGNOSIS — I2510 Atherosclerotic heart disease of native coronary artery without angina pectoris: Secondary | ICD-10-CM | POA: Diagnosis not present

## 2017-04-12 NOTE — Progress Notes (Signed)
04/12/2017 Tara Potts   [birth year]  [id number]  Primary Physician [name], MD Primary Cardiologist: Lorretta Harp MD Garret Reddish, [place], [place]  HPI:  Tara Potts is a 83 y.o. Potts mildly overweight, widowed Tara Potts, mother of 2, grandmother to 1 grandchild who I saw in the office 01/31/16.[name] Her husband Tara Potts was also a patient of mine and is deceased. She has a history of CAD status post coronary artery bypass grafting November 2007 at [facility]. Her other problems include hypertension, hyperlipidemia and mild carotid disease. She had a Myoview stress test performed January 12, 2011, which was nonischemic. Carotid Dopplers performed August 27, 2011, showed moderate right and mild left ICA stenosis scheduled to be redone in February of next year. I last cath'd her January 09, 2008, revealing diffusely diseased graft to the PDA as well as diffuse PDA disease and I elected to treat her medically at that time. Her major complaints are nocturnal leg cramps. Since I saw her a year ago she has remained asymptomatic denying chest pain or shortness of breath.   Current Meds  Medication Sig  . Ascorbic Acid (VITAMIN C PO) Take 1 tablet by mouth daily.   [name] aspirin 325 MG tablet Take 325 mg by mouth daily.    . Calcium-Magnesium-Vitamin D (CALCIUM MAGNESIUM PO) Take 1 tablet by mouth 2 (two) times daily.   [name] co-enzyme Q-10 30 MG capsule Take 30 mg by mouth daily.   . DULoxetine (CYMBALTA) 60 MG capsule Take 1 capsule (60 mg total) by mouth daily.  [name] estradiol-norethindrone (COMBIPATCH) 0.05-0.14 MG/DAY PLACE 1 PATCH ONTO THE SKIN MONTHLY  . fish oil-omega-3 fatty acids 1000 MG capsule Take 2 g by mouth daily.    . furosemide (LASIX) 40 MG tablet 40 mg daily.  [name] GARLIC PO Take 1 tablet by mouth 2 (two) times daily.   [name] glucosamine-chondroitin 500-400 MG tablet Take 2 tablets by mouth 2 (two) times daily.  [name] losartan-hydrochlorothiazide (HYZAAR) 50-12.5 MG tablet Take 1  tablet by mouth daily.  . Magnesium 200 MG TABS Take 200 mg by mouth at bedtime.  . metoprolol (LOPRESSOR) 50 MG tablet TAKE 1 AND 1/2 TABLETS BY MOUTH TWICE DAILY (Patient taking differently: Take one tablet by mouth twice daily.)  . Multiple Vitamin (MULTIVITAMIN WITH MINERALS) TABS tablet Take 1 tablet by mouth daily.  . potassium chloride (K-DUR) 10 MEQ tablet Take 1 tablet (10 mEq total) by mouth daily. Need appointment  . rosuvastatin (CRESTOR) 20 MG tablet Take 20 mg by mouth daily.     No Known Allergies  Social History   Social History  . Marital status: Widowed    Spouse name: N/A  . Number of children: 1  . Years of education: college   Occupational History  . Not on file.   Social History Main Topics  . Smoking status: Former Research scientist (life sciences)  . Smokeless tobacco: Never Used     Comment: Quit 30 years ago  . Alcohol use No  . Drug use: No  . Sexual activity: No   Other Topics Concern  . Not on file   Social History Narrative   Patient lives at home and her daughter and son in law live with her. Widowed.   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   Education college.   Left handed.   Caffeine       Review of Systems: General: negative for chills, fever, night sweats or weight changes.  Cardiovascular:  negative for chest pain, dyspnea on exertion, edema, orthopnea, palpitations, paroxysmal nocturnal dyspnea or shortness of breath Dermatological: negative for rash Respiratory: negative for cough or wheezing Urologic: negative for hematuria Abdominal: negative for nausea, vomiting, diarrhea, bright red blood per rectum, melena, or hematemesis Neurologic: negative for visual changes, syncope, or dizziness All other systems reviewed and are otherwise negative except as noted above.    Blood pressure 134/62, pulse 72, height 5\' 6"  (1.676 m), weight 192 lb (87.1 kg), last menstrual period 07/16/2002.  General appearance: alert and no distress Neck: no adenopathy, no  carotid bruit, no JVD, supple, symmetrical, trachea midline and thyroid not enlarged, symmetric, no tenderness/mass/nodules Lungs: clear to auscultation bilaterally Heart: regular rate and rhythm, S1, S2 normal, no murmur, click, rub or gallop Extremities: extremities normal, atraumatic, no cyanosis or edema Pulses: 2+ and symmetric Skin: Skin color, texture, turgor normal. No rashes or lesions Neurologic: Alert and oriented X 3, normal strength and tone. Normal symmetric reflexes. Normal coordination and gait  EKG sinus rhythm at 72 without ST or T-wave changes. I personally reviewed this EKG.  ASSESSMENT AND PLAN:   Coronary artery disease History of coronary artery disease status post bypass grafting November 2007 at [facility]. I catheterized her 01/09/08 revealing diffusely diseased graft to the PDA as well as the diffuse PDA disease and I elected to treat her medically. She denies chest pain or shortness of breath.  Essential hypertension History of essential hypertension blood pressure measures 134/62. She is on losartan, hydrochlorothiazide and metoprolol. Continue current meds at current dosing.  Elevated cholesterol History of hyperlipidemia on statin therapy followed by her PCP  Carotid artery disease ([place]) History of coronary artery disease with Dopplers performed 04/03/16 revealing moderate right and mild to moderate left ICA stenosis. She is neurologically symptomatic. Will follow this on an annual basis.      Lorretta Harp MD FACP,FACC,FAHA, [facility] 04/12/2017 11:45 AM

## 2017-04-12 NOTE — Assessment & Plan Note (Signed)
History of coronary artery disease status post bypass grafting November 2007 at [facility]. I catheterized her 01/09/08 revealing diffusely diseased graft to the PDA as well as the diffuse PDA disease and I elected to treat her medically. She denies chest pain or shortness of breath.

## 2017-04-12 NOTE — Patient Instructions (Addendum)
Medication Instructions: Your physician recommends that you continue on your current medications as directed. Please refer to the Current Medication list given to you today.  Testing: Your physician has requested that you have a carotid duplex. This test is an ultrasound of the carotid arteries in your neck. It looks at blood flow through these arteries that supply the brain with blood. Allow one hour for this exam. There are no restrictions or special instructions.  Follow-Up: Your physician wants you to follow-up in: 1 year with Dr. Gwenlyn Found. You will receive a reminder letter in the mail two months in advance. If you don't receive a letter, please call our office to schedule the follow-up appointment.  If you need a refill on your cardiac medications before your next appointment, please call your pharmacy.

## 2017-04-12 NOTE — Assessment & Plan Note (Signed)
History of hyperlipidemia on statin therapy followed by her PCP. 

## 2017-04-12 NOTE — Assessment & Plan Note (Signed)
History of essential hypertension blood pressure measures 134/62. She is on losartan, hydrochlorothiazide and metoprolol. Continue current meds at current dosing.

## 2017-04-12 NOTE — Assessment & Plan Note (Signed)
History of coronary artery disease with Dopplers performed 04/03/16 revealing moderate right and mild to moderate left ICA stenosis. She is neurologically symptomatic. Will follow this on an annual basis.

## 2017-04-19 ENCOUNTER — Ambulatory Visit (HOSPITAL_COMMUNITY)
Admission: RE | Admit: 2017-04-19 | Discharge: 2017-04-19 | Disposition: A | Discharge status: Home / Self Care | Payer: Medicare Other | Source: Ambulatory Visit | Attending: Cardiology | Admitting: Cardiology

## 2017-04-19 DIAGNOSIS — I6523 Occlusion and stenosis of bilateral carotid arteries: Secondary | ICD-10-CM | POA: Diagnosis not present

## 2017-04-19 DIAGNOSIS — I779 Disorder of arteries and arterioles, unspecified: Secondary | ICD-10-CM | POA: Diagnosis not present

## 2017-04-19 DIAGNOSIS — I739 Peripheral vascular disease, unspecified: Secondary | ICD-10-CM

## 2017-04-22 ENCOUNTER — Other Ambulatory Visit: Payer: Self-pay | Admitting: Cardiovascular Disease

## 2017-04-22 DIAGNOSIS — I6523 Occlusion and stenosis of bilateral carotid arteries: Secondary | ICD-10-CM

## 2017-05-08 DIAGNOSIS — M1990 Unspecified osteoarthritis, unspecified site: Secondary | ICD-10-CM | POA: Diagnosis not present

## 2017-05-08 DIAGNOSIS — M2550 Pain in unspecified joint: Secondary | ICD-10-CM | POA: Diagnosis not present

## 2017-05-08 DIAGNOSIS — M25559 Pain in unspecified hip: Secondary | ICD-10-CM | POA: Diagnosis not present

## 2017-05-08 DIAGNOSIS — G5700 Lesion of sciatic nerve, unspecified lower limb: Secondary | ICD-10-CM | POA: Diagnosis not present

## 2017-05-08 DIAGNOSIS — M1180 Other specified crystal arthropathies, unspecified site: Secondary | ICD-10-CM | POA: Diagnosis not present

## 2017-05-08 DIAGNOSIS — M549 Dorsalgia, unspecified: Secondary | ICD-10-CM | POA: Diagnosis not present

## 2017-05-08 DIAGNOSIS — I7300 Raynaud's syndrome without gangrene: Secondary | ICD-10-CM | POA: Diagnosis not present

## 2017-05-14 DIAGNOSIS — Z0000 Encounter for general adult medical examination without abnormal findings: Secondary | ICD-10-CM | POA: Diagnosis not present

## 2017-05-14 DIAGNOSIS — E559 Vitamin D deficiency, unspecified: Secondary | ICD-10-CM | POA: Diagnosis not present

## 2017-05-14 DIAGNOSIS — E7800 Pure hypercholesterolemia, unspecified: Secondary | ICD-10-CM | POA: Diagnosis not present

## 2017-05-14 DIAGNOSIS — I10 Essential (primary) hypertension: Secondary | ICD-10-CM | POA: Diagnosis not present

## 2017-05-14 DIAGNOSIS — Z7982 Long term (current) use of aspirin: Secondary | ICD-10-CM | POA: Diagnosis not present

## 2017-05-15 ENCOUNTER — Other Ambulatory Visit: Payer: Self-pay

## 2017-05-15 ENCOUNTER — Other Ambulatory Visit: Payer: Self-pay | Admitting: *Deleted

## 2017-05-15 DIAGNOSIS — E8809 Other disorders of plasma-protein metabolism, not elsewhere classified: Secondary | ICD-10-CM | POA: Diagnosis not present

## 2017-05-15 MED ORDER — LOSARTAN POTASSIUM-HCTZ 50-12.5 MG PO TABS: 1.0000 | ORAL_TABLET | Freq: Every day | ORAL | 2 refills | Status: DC

## 2017-05-15 MED ORDER — LOSARTAN POTASSIUM-HCTZ 50-12.5 MG PO TABS: 1.0000 | ORAL_TABLET | Freq: Every day | ORAL | 3 refills | Status: DC

## 2017-05-16 DIAGNOSIS — M545 Low back pain: Secondary | ICD-10-CM | POA: Diagnosis not present

## 2017-05-17 DIAGNOSIS — E7800 Pure hypercholesterolemia, unspecified: Secondary | ICD-10-CM | POA: Diagnosis not present

## 2017-05-17 DIAGNOSIS — E789 Disorder of lipoprotein metabolism, unspecified: Secondary | ICD-10-CM | POA: Diagnosis not present

## 2017-05-17 DIAGNOSIS — I739 Peripheral vascular disease, unspecified: Secondary | ICD-10-CM | POA: Diagnosis not present

## 2017-05-17 DIAGNOSIS — N183 Chronic kidney disease, stage 3 (moderate): Secondary | ICD-10-CM | POA: Diagnosis not present

## 2017-05-17 DIAGNOSIS — I2510 Atherosclerotic heart disease of native coronary artery without angina pectoris: Secondary | ICD-10-CM | POA: Diagnosis not present

## 2017-05-17 DIAGNOSIS — R351 Nocturia: Secondary | ICD-10-CM | POA: Diagnosis not present

## 2017-05-17 DIAGNOSIS — G629 Polyneuropathy, unspecified: Secondary | ICD-10-CM | POA: Diagnosis not present

## 2017-05-17 DIAGNOSIS — R55 Syncope and collapse: Secondary | ICD-10-CM | POA: Diagnosis not present

## 2017-05-17 DIAGNOSIS — I10 Essential (primary) hypertension: Secondary | ICD-10-CM | POA: Diagnosis not present

## 2017-05-17 DIAGNOSIS — E8809 Other disorders of plasma-protein metabolism, not elsewhere classified: Secondary | ICD-10-CM | POA: Diagnosis not present

## 2017-05-17 DIAGNOSIS — M5431 Sciatica, right side: Secondary | ICD-10-CM | POA: Diagnosis not present

## 2017-05-17 DIAGNOSIS — E782 Mixed hyperlipidemia: Secondary | ICD-10-CM | POA: Diagnosis not present

## 2017-05-20 ENCOUNTER — Other Ambulatory Visit: Payer: Self-pay | Admitting: Orthopedic Surgery

## 2017-05-20 DIAGNOSIS — M545 Low back pain: Secondary | ICD-10-CM

## 2017-05-23 DIAGNOSIS — I739 Peripheral vascular disease, unspecified: Secondary | ICD-10-CM | POA: Diagnosis not present

## 2017-05-23 DIAGNOSIS — I70203 Unspecified atherosclerosis of native arteries of extremities, bilateral legs: Secondary | ICD-10-CM | POA: Diagnosis not present

## 2017-05-23 DIAGNOSIS — E8809 Other disorders of plasma-protein metabolism, not elsewhere classified: Secondary | ICD-10-CM | POA: Diagnosis not present

## 2017-05-24 DIAGNOSIS — I10 Essential (primary) hypertension: Secondary | ICD-10-CM | POA: Diagnosis not present

## 2017-05-24 DIAGNOSIS — I2510 Atherosclerotic heart disease of native coronary artery without angina pectoris: Secondary | ICD-10-CM | POA: Diagnosis not present

## 2017-05-24 DIAGNOSIS — E782 Mixed hyperlipidemia: Secondary | ICD-10-CM | POA: Diagnosis not present

## 2017-05-29 ENCOUNTER — Encounter: Payer: Self-pay | Admitting: Internal Medicine

## 2017-05-29 ENCOUNTER — Telehealth: Payer: Self-pay | Admitting: Internal Medicine

## 2017-05-29 NOTE — Telephone Encounter (Signed)
Appt has been scheduled for the pt to see Dr. Julien Nordmann on 12/3 at 1130am. Letter mailed to the pt and faxed to the referring to notify the pt.

## 2017-06-17 ENCOUNTER — Ambulatory Visit (HOSPITAL_BASED_OUTPATIENT_CLINIC_OR_DEPARTMENT_OTHER): Payer: Medicare Other | Admitting: Internal Medicine

## 2017-06-17 ENCOUNTER — Encounter: Payer: Self-pay | Admitting: Internal Medicine

## 2017-06-17 ENCOUNTER — Ambulatory Visit (HOSPITAL_BASED_OUTPATIENT_CLINIC_OR_DEPARTMENT_OTHER): Payer: Medicare Other

## 2017-06-17 VITALS — BP 180/50 | HR 75 | Temp 98.4°F | Resp 18 | Ht 66.0 in | Wt 194.4 lb

## 2017-06-17 DIAGNOSIS — I10 Essential (primary) hypertension: Secondary | ICD-10-CM | POA: Diagnosis not present

## 2017-06-17 DIAGNOSIS — D472 Monoclonal gammopathy: Secondary | ICD-10-CM | POA: Diagnosis not present

## 2017-06-17 LAB — CBC WITH DIFFERENTIAL/PLATELET
BASO%: 0.4 % (ref 0.0–2.0)
Basophils Absolute: 0 10*3/uL (ref 0.0–0.1)
EOS%: 2.7 % (ref 0.0–7.0)
Eosinophils Absolute: 0.1 10*3/uL (ref 0.0–0.5)
HCT: 33.8 % — ABNORMAL LOW (ref 34.8–46.6)
HGB: 11.4 g/dL — ABNORMAL LOW (ref 11.6–15.9)
LYMPH#: 2.2 10*3/uL (ref 0.9–3.3)
LYMPH%: 42.5 % (ref 14.0–49.7)
MCH: 26.4 pg (ref 25.1–34.0)
MCHC: 33.7 g/dL (ref 31.5–36.0)
MCV: 78.2 fL — ABNORMAL LOW (ref 79.5–101.0)
MONO#: 0.5 10*3/uL (ref 0.1–0.9)
MONO%: 9.8 % (ref 0.0–14.0)
NEUT%: 44.6 % (ref 38.4–76.8)
NEUTROS ABS: 2.3 10*3/uL (ref 1.5–6.5)
Platelets: 242 10*3/uL (ref 145–400)
RBC: 4.32 10*6/uL (ref 3.70–5.45)
RDW: 16.7 % — ABNORMAL HIGH (ref 11.2–14.5)
WBC: 5.2 10*3/uL (ref 3.9–10.3)

## 2017-06-17 LAB — COMPREHENSIVE METABOLIC PANEL
ALK PHOS: 70 U/L (ref 40–150)
ALT: 21 U/L (ref 0–55)
AST: 26 U/L (ref 5–34)
Albumin: 3.9 g/dL (ref 3.5–5.0)
Anion Gap: 10 mEq/L (ref 3–11)
BILIRUBIN TOTAL: 0.75 mg/dL (ref 0.20–1.20)
BUN: 19.8 mg/dL (ref 7.0–26.0)
CO2: 26 mEq/L (ref 22–29)
CREATININE: 1.4 mg/dL — AB (ref 0.6–1.1)
Calcium: 9.7 mg/dL (ref 8.4–10.4)
Chloride: 92 mEq/L — ABNORMAL LOW (ref 98–109)
EGFR: 42 mL/min/{1.73_m2} — ABNORMAL LOW (ref >60)
GLUCOSE: 89 mg/dL (ref 70–140)
POTASSIUM: 4.5 meq/L (ref 3.5–5.1)
Sodium: 128 mEq/L — ABNORMAL LOW (ref 136–145)
TOTAL PROTEIN: 8.3 g/dL (ref 6.4–8.3)

## 2017-06-17 LAB — LACTATE DEHYDROGENASE: LDH: 194 U/L (ref 125–245)

## 2017-06-17 NOTE — Progress Notes (Signed)
[place] [phone]   Fax:([phone]  CONSULT NOTE  REFERRING PHYSICIAN: Dr. [name]  REASON FOR CONSULTATION:  83 years old African-American female with M spike.  HPI Tara Potts is a 83 y.o. female with past medical history significant for coronary artery disease status post CABG, hypertension, dyslipidemia, peripheral neuropathy as well as syncopal episodes.  The patient was seen by her primary care physician a few weeks ago complaining of swelling in her feet as well as aching in her legs and cramps more on the right side.  She also had increased urine frequency.  During her evaluation comprehensive metabolic panel was performed and showed elevated serum protein of 8.3.  This was followed by serum protein electrophoresis and it showed M spike of 1.8. The patient was referred to me today for further evaluation and recommendation regarding her condition. When seen today she is feeling fine except for aching pain in the right leg.  She denied having any other significant complaints.  She has no nausea, vomiting, diarrhea or constipation.  She denied having any chest pain, shortness of breath, cough or hemoptysis.  She has no weight loss or night sweats.  The patient denied having any headache or visual changes. Family history significant for a mother with diabetes mellitus and heart disease, father died from old age, maternal aunt had breast cancer. The patient is a widow and has one daughter, Tara Potts who accompanied her to the visit today.  She used to work as a Radio producer.  She has a history of smoking but quit 32 years ago.  She has no history of alcohol or drug abuse.  HPI  Past Medical History:  Diagnosis Date  . CAD (coronary artery disease)   . Carotid disease, bilateral (HCC)    mild  . Fibroid   . Hx of CABG 2007   [facility]  . Hyperlipemia   . Hypertension   . Menopausal symptoms   . Peripheral neuropathy   .  Seizure-like activity [facility] 2)     Past Surgical History:  Procedure Laterality Date  . CARDIAC CATHETERIZATION  01/09/2008   diffusely diseased graft to the PDA & diffuse PDA disease  . CAROTID DOPPLER  08/27/2011   mod. right & nild left ICA stenosis  . CATARACT SURGERY    . CORONARY ARTERY BYPASS GRAFT    . FOOT SURGERY    . MASS REMOVED FROM CERVIX  12/1997   Benign endocervical inclusion cysts  . NM MYOVIEW LTD  01/12/2011   no ischemia  . TRIPLE HEART BYPASS  05/2006   [facility]  . US ECHOCARDIOGRAPHY  10/30/2007   mild MA,MR,TR,AOV mildly sclerotic,density oted in the prox asc aorta    Family History  Problem Relation Age of Onset  . Diabetes Mother   . Hypertension Mother   . Heart disease Mother   . Heart failure Mother   . Hypertension Sister   . Breast cancer Maternal Aunt        Age 57  . Heart failure Maternal Aunt   . Heart failure Maternal Uncle     Social History Social History   Tobacco Use  . Smoking status: Former Research scientist (life sciences)  . Smokeless tobacco: Never Used  . Tobacco comment: Quit 30 years ago  Substance Use Topics  . Alcohol use: No    Alcohol/week: 0.0 oz  . Drug use: No    No Known Allergies  Current Outpatient Medications  Medication Sig Dispense Refill  .  Ascorbic Acid (VITAMIN C PO) Take 1 tablet by mouth daily.     [name] aspirin 325 MG tablet Take 325 mg by mouth daily.      . Calcium-Magnesium-Vitamin D (CALCIUM MAGNESIUM PO) Take 1 tablet by mouth 2 (two) times daily.     [name] co-enzyme Q-10 30 MG capsule Take 30 mg by mouth daily.     [name] estradiol-norethindrone (COMBIPATCH) 0.05-0.14 MG/DAY PLACE 1 PATCH ONTO THE SKIN MONTHLY 8 patch 3  . fish oil-omega-3 fatty acids 1000 MG capsule Take 2 g by mouth daily.      . furosemide (LASIX) 40 MG tablet 40 mg daily.  6  . GARLIC PO Take 1 tablet by mouth 2 (two) times daily.     [name] glucosamine-chondroitin 500-400 MG tablet Take 2 tablets by mouth 2 (two) times daily.    [name]  losartan-hydrochlorothiazide (HYZAAR) 50-12.5 MG tablet Take 1 tablet by mouth daily. 90 tablet 3  . Magnesium 200 MG TABS Take 200 mg by mouth at bedtime.    . metoprolol (LOPRESSOR) 50 MG tablet TAKE 1 AND 1/2 TABLETS BY MOUTH TWICE DAILY (Patient taking differently: Take one tablet by mouth twice daily.) 90 tablet 11  . Multiple Vitamin (MULTIVITAMIN WITH MINERALS) TABS tablet Take 1 tablet by mouth daily.    . potassium chloride (K-DUR) 10 MEQ tablet Take 1 tablet (10 mEq total) by mouth daily. Need appointment 90 tablet 0  . rosuvastatin (CRESTOR) 20 MG tablet Take 20 mg by mouth daily.  3   No current facility-administered medications for this visit.     Review of Systems  Constitutional: positive for fatigue Eyes: negative Ears, nose, mouth, throat, and face: negative Respiratory: negative Cardiovascular: negative Gastrointestinal: negative Genitourinary:negative Integument/breast: negative Hematologic/lymphatic: negative Musculoskeletal:positive for myalgias Neurological: negative Behavioral/Psych: negative Endocrine: negative Allergic/Immunologic: negative  Physical Exam  LZJ:QBHAL, healthy, no distress, well nourished and well developed SKIN: skin color, texture, turgor are normal, no rashes or significant lesions HEAD: Normocephalic, No masses, lesions, tenderness or abnormalities EYES: normal, PERRLA, Conjunctiva are pink and non-injected EARS: External ears normal, Canals clear OROPHARYNX:no exudate, no erythema and lips, buccal mucosa, and tongue normal  NECK: supple, no adenopathy, no JVD LYMPH:  no palpable lymphadenopathy, no hepatosplenomegaly BREAST:not examined LUNGS: clear to auscultation , and palpation HEART: regular rate & rhythm, no murmurs and no gallops ABDOMEN:abdomen soft, non-tender, normal bowel sounds and no masses or organomegaly BACK: Back symmetric, no curvature., No CVA tenderness EXTREMITIES:no joint deformities, effusion, or  inflammation, no edema  NEURO: alert & oriented x 3 with fluent speech, no focal motor/sensory deficits  PERFORMANCE STATUS: ECOG 1  LABORATORY DATA: Lab Results  Component Value Date   WBC 5.7 02/22/2017   HGB 12.9 02/22/2017   HCT 38.7 02/22/2017   MCV 79.6 02/22/2017   PLT 221 02/22/2017      Chemistry      Component Value Date/Time   NA 139 02/22/2017 1500   K 4.1 02/22/2017 1500   CL 104 02/22/2017 1500   CO2 26 02/22/2017 1500   BUN 20 02/22/2017 1500   CREATININE 1.20 (H) 02/22/2017 1500   CREATININE 1.07 02/17/2014 1046      Component Value Date/Time   CALCIUM 9.2 02/22/2017 1500       RADIOGRAPHIC STUDIES: No results found.  ASSESSMENT: This is a very pleasant 83 years old African-American female who was recently found on blood work to have M spike suspicious for monoclonal gammopathy of undetermined significance versus early multiple myeloma.  PLAN: I had a lengthy discussion with the patient and her daughter about her current condition and further investigation to confirm her diagnosis. I recommended for the patient to have repeat CBC, comprehensive metabolic panel, LDH as well as myeloma panel performed today. I will arrange for the patient to come back for follow-up visit in 2 weeks for reevaluation and discussion of her lab results. If the myeloma panel is concerning for multiple myeloma, I would consider the patient for skeletal bone survey as well as bone marrow biopsy and aspirate. For hypertension, the patient was very anxious today about her visit to the cancer center.  I recommended for her to take her blood pressure medication as prescribed and to reconsult with her primary care physician if no improvement in her condition. She and her daughter agreed to the current plan. She was advised to call immediately if she has any concerning symptoms in the interval. The patient voices understanding of current disease status and treatment options and is in  agreement with the current care plan. All questions were answered. The patient knows to call the clinic with any problems, questions or concerns. We can certainly see the patient much sooner if necessary.  Thank you so much for allowing me to participate in the care of ALTON TREMBLAY. I will continue to follow up the patient with you and assist in her care.  I spent 40 minutes counseling the patient face to face. The total time spent in the appointment was 60 minutes.  Disclaimer: This note was dictated with voice recognition software. Similar sounding words can inadvertently be transcribed and may not be corrected upon review.   Eilleen Kempf June 17, 2017, 12:01 PM

## 2017-06-18 ENCOUNTER — Telehealth: Payer: Self-pay | Admitting: Internal Medicine

## 2017-06-18 LAB — BETA 2 MICROGLOBULIN, SERUM: BETA 2: 2.7 mg/L — AB (ref 0.6–2.4)

## 2017-06-18 LAB — KAPPA/LAMBDA LIGHT CHAINS
IG KAPPA FREE LIGHT CHAIN: 222.6 mg/L — AB (ref 3.3–19.4)
Ig Lambda Free Light Chain: 13.1 mg/L (ref 5.7–26.3)
Kappa/Lambda FluidC Ratio: 16.99 — ABNORMAL HIGH (ref 0.26–1.65)

## 2017-06-18 LAB — IGG, IGA, IGM
IgA, Qn, Serum: 165 mg/dL (ref 64–422)
IgG, Qn, Serum: 1439 mg/dL (ref 700–1600)
IgM, Qn, Serum: 30 mg/dL (ref 26–217)

## 2017-06-18 NOTE — Telephone Encounter (Signed)
Spoke to patient regarding upcoming December appointments.  °

## 2017-06-22 ENCOUNTER — Other Ambulatory Visit: Payer: Self-pay | Admitting: Cardiovascular Disease

## 2017-07-01 DIAGNOSIS — M25559 Pain in unspecified hip: Secondary | ICD-10-CM | POA: Diagnosis not present

## 2017-07-01 DIAGNOSIS — M62830 Muscle spasm of back: Secondary | ICD-10-CM | POA: Diagnosis not present

## 2017-07-01 DIAGNOSIS — M549 Dorsalgia, unspecified: Secondary | ICD-10-CM | POA: Diagnosis not present

## 2017-07-01 DIAGNOSIS — M9903 Segmental and somatic dysfunction of lumbar region: Secondary | ICD-10-CM | POA: Diagnosis not present

## 2017-07-01 DIAGNOSIS — M1990 Unspecified osteoarthritis, unspecified site: Secondary | ICD-10-CM | POA: Diagnosis not present

## 2017-07-01 DIAGNOSIS — M1180 Other specified crystal arthropathies, unspecified site: Secondary | ICD-10-CM | POA: Diagnosis not present

## 2017-07-01 DIAGNOSIS — M545 Low back pain: Secondary | ICD-10-CM | POA: Diagnosis not present

## 2017-07-01 DIAGNOSIS — M2550 Pain in unspecified joint: Secondary | ICD-10-CM | POA: Diagnosis not present

## 2017-07-01 DIAGNOSIS — I7300 Raynaud's syndrome without gangrene: Secondary | ICD-10-CM | POA: Diagnosis not present

## 2017-07-01 DIAGNOSIS — M9904 Segmental and somatic dysfunction of sacral region: Secondary | ICD-10-CM | POA: Diagnosis not present

## 2017-07-01 DIAGNOSIS — N189 Chronic kidney disease, unspecified: Secondary | ICD-10-CM | POA: Diagnosis not present

## 2017-07-03 ENCOUNTER — Other Ambulatory Visit: Payer: Self-pay | Admitting: *Deleted

## 2017-07-03 ENCOUNTER — Ambulatory Visit (HOSPITAL_BASED_OUTPATIENT_CLINIC_OR_DEPARTMENT_OTHER): Payer: Medicare Other | Admitting: Internal Medicine

## 2017-07-03 ENCOUNTER — Other Ambulatory Visit (HOSPITAL_BASED_OUTPATIENT_CLINIC_OR_DEPARTMENT_OTHER): Payer: Medicare Other

## 2017-07-03 ENCOUNTER — Encounter: Payer: Self-pay | Admitting: Internal Medicine

## 2017-07-03 ENCOUNTER — Telehealth: Payer: Self-pay | Admitting: Internal Medicine

## 2017-07-03 VITALS — BP 189/55 | HR 65 | Temp 97.7°F | Resp 19 | Ht 66.0 in | Wt 194.8 lb

## 2017-07-03 DIAGNOSIS — D472 Monoclonal gammopathy: Secondary | ICD-10-CM

## 2017-07-03 DIAGNOSIS — E871 Hypo-osmolality and hyponatremia: Secondary | ICD-10-CM

## 2017-07-03 DIAGNOSIS — D259 Leiomyoma of uterus, unspecified: Secondary | ICD-10-CM

## 2017-07-03 DIAGNOSIS — M9904 Segmental and somatic dysfunction of sacral region: Secondary | ICD-10-CM | POA: Diagnosis not present

## 2017-07-03 DIAGNOSIS — M62830 Muscle spasm of back: Secondary | ICD-10-CM | POA: Diagnosis not present

## 2017-07-03 DIAGNOSIS — I10 Essential (primary) hypertension: Secondary | ICD-10-CM | POA: Diagnosis not present

## 2017-07-03 DIAGNOSIS — M9903 Segmental and somatic dysfunction of lumbar region: Secondary | ICD-10-CM | POA: Diagnosis not present

## 2017-07-03 DIAGNOSIS — M545 Low back pain: Secondary | ICD-10-CM | POA: Diagnosis not present

## 2017-07-03 LAB — CBC WITH DIFFERENTIAL/PLATELET
BASO%: 0.8 % (ref 0.0–2.0)
BASOS ABS: 0 10*3/uL (ref 0.0–0.1)
EOS%: 5.4 % (ref 0.0–7.0)
Eosinophils Absolute: 0.3 10*3/uL (ref 0.0–0.5)
HEMATOCRIT: 33.1 % — AB (ref 34.8–46.6)
HGB: 10.9 g/dL — ABNORMAL LOW (ref 11.6–15.9)
LYMPH#: 1.9 10*3/uL (ref 0.9–3.3)
LYMPH%: 30.7 % (ref 14.0–49.7)
MCH: 26.7 pg (ref 25.1–34.0)
MCHC: 32.9 g/dL (ref 31.5–36.0)
MCV: 81.1 fL (ref 79.5–101.0)
MONO#: 0.7 10*3/uL (ref 0.1–0.9)
MONO%: 10.8 % (ref 0.0–14.0)
NEUT#: 3.2 10*3/uL (ref 1.5–6.5)
NEUT%: 52.3 % (ref 38.4–76.8)
Platelets: 224 10*3/uL (ref 145–400)
RBC: 4.08 10*6/uL (ref 3.70–5.45)
RDW: 17.1 % — ABNORMAL HIGH (ref 11.2–14.5)
WBC: 6.1 10*3/uL (ref 3.9–10.3)

## 2017-07-03 LAB — COMPREHENSIVE METABOLIC PANEL
ALBUMIN: 3.7 g/dL (ref 3.5–5.0)
ALK PHOS: 72 U/L (ref 40–150)
ALT: 17 U/L (ref 0–55)
AST: 25 U/L (ref 5–34)
Anion Gap: 8 mEq/L (ref 3–11)
BILIRUBIN TOTAL: 0.51 mg/dL (ref 0.20–1.20)
BUN: 25.5 mg/dL (ref 7.0–26.0)
CO2: 29 mEq/L (ref 22–29)
CREATININE: 1.5 mg/dL — AB (ref 0.6–1.1)
Calcium: 9.4 mg/dL (ref 8.4–10.4)
Chloride: 101 mEq/L (ref 98–109)
EGFR: 37 mL/min/{1.73_m2} — AB (ref >60)
GLUCOSE: 96 mg/dL (ref 70–140)
Potassium: 4.6 mEq/L (ref 3.5–5.1)
SODIUM: 138 meq/L (ref 136–145)
TOTAL PROTEIN: 7.8 g/dL (ref 6.4–8.3)

## 2017-07-03 MED ORDER — CLONIDINE HCL 0.1 MG PO TABS
0.2000 mg | ORAL_TABLET | Freq: Once | ORAL | Status: AC
  Administered 2017-07-03: 0.2 mg via ORAL

## 2017-07-03 MED ORDER — CLONIDINE HCL 0.1 MG PO TABS
ORAL_TABLET | ORAL | Status: AC
  Filled 2017-07-03: qty 2

## 2017-07-03 NOTE — Telephone Encounter (Signed)
Scheduled appt per 12/19 los - Gave patient AVS and calender per los.  

## 2017-07-03 NOTE — Progress Notes (Signed)
[place] [phone]   Fax:(336) [place], [place] [address] [place] [address]  DIAGNOSIS: Monoclonal gammopathy of undetermined significance.  PRIOR THERAPY: None  CURRENT THERAPY: None  INTERVAL HISTORY: Tara Potts 83 y.o. female returns to the clinic today for follow-up visit accompanied by her daughter.  The patient is feeling fine today with no specific complaints except for fatigue and low back pain.  She is currently evaluated by her chiropractor for the low back pain.  She denied having any chest pain, shortness of breath, cough or hemoptysis.  She denied having any nausea or vomiting or fever or chills.  The patient denied having any significant weight loss or night sweats.  She had myeloma panel performed recently as she is here for evaluation and discussion of her lab results.  MEDICAL HISTORY: Past Medical History:  Diagnosis Date  . CAD (coronary artery disease)   . Carotid disease, bilateral (HCC)    mild  . Fibroid   . Hx of CABG 2007   [facility]  . Hyperlipemia   . Hypertension   . Menopausal symptoms   . Peripheral neuropathy   . Seizure-like activity (HCC)     ALLERGIES:  has No Known Allergies.  MEDICATIONS:  Current Outpatient Medications  Medication Sig Dispense Refill  . Ascorbic Acid (VITAMIN C PO) Take 1 tablet by mouth daily.     [name] aspirin 325 MG tablet Take 325 mg by mouth daily.      . Calcium-Magnesium-Vitamin D (CALCIUM MAGNESIUM PO) Take 1 tablet by mouth 2 (two) times daily.     [name] co-enzyme Q-10 30 MG capsule Take 30 mg by mouth daily.     . DULoxetine (CYMBALTA) 60 MG capsule Take by mouth daily.  6  . estradiol-norethindrone (COMBIPATCH) 0.05-0.14 MG/DAY PLACE 1 PATCH ONTO THE SKIN MONTHLY 8 patch 3  . fish oil-omega-3 fatty acids 1000 MG capsule Take 2 g by mouth daily.      . furosemide (LASIX) 40 MG tablet 40 mg daily.  6  . GARLIC PO  Take 1 tablet by mouth 2 (two) times daily.     [name] glucosamine-chondroitin 500-400 MG tablet Take 2 tablets by mouth 2 (two) times daily.    [name] losartan-hydrochlorothiazide (HYZAAR) 50-12.5 MG tablet Take 1 tablet by mouth daily. 90 tablet 3  . Magnesium 200 MG TABS Take 200 mg by mouth at bedtime.    . metoprolol (LOPRESSOR) 50 MG tablet TAKE 1 AND 1/2 TABLETS BY MOUTH TWICE DAILY (Patient taking differently: Take one tablet by mouth twice daily.) 90 tablet 11  . Multiple Vitamin (MULTIVITAMIN WITH MINERALS) TABS tablet Take 1 tablet by mouth daily.    . potassium chloride (K-DUR) 10 MEQ tablet TAKE 1 TABLET(10 MEQ) BY MOUTH DAILY 90 tablet 0  . rosuvastatin (CRESTOR) 20 MG tablet Take 20 mg by mouth daily.  3   No current facility-administered medications for this visit.     SURGICAL HISTORY:  Past Surgical History:  Procedure Laterality Date  . CARDIAC CATHETERIZATION  01/09/2008   diffusely diseased graft to the PDA & diffuse PDA disease  . CAROTID DOPPLER  08/27/2011   mod. right & nild left ICA stenosis  . CATARACT SURGERY    . CORONARY ARTERY BYPASS GRAFT    . FOOT SURGERY    . MASS REMOVED FROM CERVIX  12/1997   Benign endocervical inclusion cysts  .  NM MYOVIEW LTD  01/12/2011   no ischemia  . TRIPLE HEART BYPASS  05/2006   [facility]  . US ECHOCARDIOGRAPHY  10/30/2007   mild MA,MR,TR,AOV mildly sclerotic,density oted in the prox asc aorta    REVIEW OF SYSTEMS:  A comprehensive review of systems was negative except for: Constitutional: positive for fatigue Musculoskeletal: positive for back pain   PHYSICAL EXAMINATION: General appearance: alert, cooperative, fatigued and no distress Head: Normocephalic, without obvious abnormality, atraumatic Neck: no adenopathy, no JVD, supple, symmetrical, trachea midline and thyroid not enlarged, symmetric, no tenderness/mass/nodules Lymph nodes: Cervical, supraclavicular, and axillary nodes normal. Resp: clear to auscultation  bilaterally Back: symmetric, no curvature. ROM normal. No CVA tenderness. Cardio: regular rate and rhythm, S1, S2 normal, no murmur, click, rub or gallop GI: soft, non-tender; bowel sounds normal; no masses,  no organomegaly Extremities: extremities normal, atraumatic, no cyanosis or edema  ECOG PERFORMANCE STATUS: 1 - Symptomatic but completely ambulatory  Blood pressure (!) 189/55, pulse 65, temperature 97.7 F (36.5 C), temperature source Oral, resp. rate 19, height 5' 6" (1.676 m), weight 194 lb 12.8 oz (88.4 kg), last menstrual period 07/16/2002, SpO2 100 %.  LABORATORY DATA: Lab Results  Component Value Date   WBC 6.1 07/03/2017   HGB 10.9 (L) 07/03/2017   HCT 33.1 (L) 07/03/2017   MCV 81.1 07/03/2017   PLT 224 07/03/2017      Chemistry      Component Value Date/Time   NA 128 (L) 06/17/2017 1234   K 4.5 06/17/2017 1234   CL 104 02/22/2017 1500   CO2 26 06/17/2017 1234   BUN 19.8 06/17/2017 1234   CREATININE 1.4 (H) 06/17/2017 1234      Component Value Date/Time   CALCIUM 9.7 06/17/2017 1234   ALKPHOS 70 06/17/2017 1234   AST 26 06/17/2017 1234   ALT 21 06/17/2017 1234   BILITOT 0.75 06/17/2017 1234     Myeloma panel: Beta-2 microglobulin 2.7, IgG 1439, IgA 165, IgM 30, free kappa light chain 222.6, free lambda light chain 13.1 with a kappa/lambda ratio of 16.99  RADIOGRAPHIC STUDIES: No results found.  ASSESSMENT AND PLAN:  This is a very pleasant 83 years old white female presented for evaluation of monoclonal gammopathy.  Her recent myeloma panel showed elevated free kappa light chain.  This is suspicious for monoclonal gammopathy of undetermined significance but multiple myeloma cannot be ruled out at this point. I discussed the lab results with the patient and her daughter.  I recommended for her to have CT-guided bone marrow biopsy and aspirate for further evaluation of her disease. This biopsy will be performed in the next 1-2 weeks.  I would see the patient  back for follow-up visit in 3-4 weeks for evaluation and discussion of her bone marrow biopsy and aspirate results and recommendation regarding her condition. For hypertension, I strongly encouraged the patient to take her blood pressure medication as prescribed and to monitor it closely at home with consultation with her primary care physician for adjustment of her medications if needed.  I also gave the patient a dose of clonidine 0.2 mg p.o. x1 today. Her hyponatremia is likely secondary to pseudohyponatremia.  We will continue to monitor it closely on upcoming blood work. The patient was advised to call immediately if she has any concerning symptoms in the interval. The patient voices understanding of current disease status and treatment options and is in agreement with the current care plan.  All questions were answered. The patient knows  to call the clinic with any problems, questions or concerns. We can certainly see the patient much sooner if necessary.  I spent 15 minutes counseling the patient face to face. The total time spent in the appointment was 25 minutes.  Disclaimer: This note was dictated with voice recognition software. Similar sounding words can inadvertently be transcribed and may not be corrected upon review.

## 2017-07-04 DIAGNOSIS — M9903 Segmental and somatic dysfunction of lumbar region: Secondary | ICD-10-CM | POA: Diagnosis not present

## 2017-07-04 DIAGNOSIS — M545 Low back pain: Secondary | ICD-10-CM | POA: Diagnosis not present

## 2017-07-04 DIAGNOSIS — M62830 Muscle spasm of back: Secondary | ICD-10-CM | POA: Diagnosis not present

## 2017-07-04 DIAGNOSIS — M9904 Segmental and somatic dysfunction of sacral region: Secondary | ICD-10-CM | POA: Diagnosis not present

## 2017-07-24 ENCOUNTER — Ambulatory Visit: Payer: Medicare Other | Admitting: Oncology

## 2017-07-24 ENCOUNTER — Other Ambulatory Visit: Payer: Self-pay | Admitting: Oncology

## 2017-07-24 DIAGNOSIS — D472 Monoclonal gammopathy: Secondary | ICD-10-CM

## 2017-07-26 ENCOUNTER — Telehealth: Payer: Self-pay | Admitting: Oncology

## 2017-07-26 NOTE — Telephone Encounter (Signed)
I received a message from radiology that the patient is refusing to have her bone marrow biopsy done.  A call was initially placed to her daughter, [name], who indicated that her mother just does not want to do the bone marrow biopsy.  I recommended to the daughter that the patient come in for a lab and a follow-up within a few weeks for evaluation and to discuss the bone marrow biopsy again with her.  I have made 2 attempts to contact Ms. Reindel and left voicemails on both occasions.  Message sent to scheduling to reschedule the patient for a lab and a visit within approximately 2 weeks.

## 2017-07-26 NOTE — Telephone Encounter (Signed)
Left message for patient regarding upcoming January appointments per 1/9 sch message.

## 2017-08-13 ENCOUNTER — Ambulatory Visit: Payer: Medicare Other | Admitting: Oncology

## 2017-09-02 DIAGNOSIS — H26493 Other secondary cataract, bilateral: Secondary | ICD-10-CM | POA: Diagnosis not present

## 2017-09-02 DIAGNOSIS — H0100A Unspecified blepharitis right eye, upper and lower eyelids: Secondary | ICD-10-CM | POA: Diagnosis not present

## 2017-09-02 DIAGNOSIS — H5212 Myopia, left eye: Secondary | ICD-10-CM | POA: Diagnosis not present

## 2017-09-02 DIAGNOSIS — H0100B Unspecified blepharitis left eye, upper and lower eyelids: Secondary | ICD-10-CM | POA: Diagnosis not present
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
## 2017-09-11 ENCOUNTER — Ambulatory Visit (INDEPENDENT_AMBULATORY_CARE_PROVIDER_SITE_OTHER): Payer: Medicare Other | Admitting: Nurse Practitioner

## 2017-09-11 ENCOUNTER — Encounter: Payer: Self-pay | Admitting: Nurse Practitioner

## 2017-09-11 VITALS — BP 205/87 | HR 84 | Ht 66.0 in | Wt 202.4 lb

## 2017-09-11 DIAGNOSIS — M5417 Radiculopathy, lumbosacral region: Secondary | ICD-10-CM | POA: Diagnosis not present

## 2017-09-11 DIAGNOSIS — R202 Paresthesia of skin: Secondary | ICD-10-CM | POA: Diagnosis not present

## 2017-09-11 DIAGNOSIS — R269 Unspecified abnormalities of gait and mobility: Secondary | ICD-10-CM

## 2017-09-11 MED ORDER — DULOXETINE HCL 30 MG PO CPEP: 30.0000 mg | ORAL_CAPSULE | Freq: Every day | ORAL | 6 refills | Status: DC

## 2017-09-11 NOTE — Progress Notes (Signed)
I have reviewed and agreed above plan. 

## 2017-09-11 NOTE — Patient Instructions (Signed)
RestartCymbalta at lower dose 30mg  daily for lower extremity paresthesias muscle cramping low back pain and hip pain Call for further passing out episodes, the patient never had EEG PT evaluate and treat gait abnormality Follow-up in 6 months

## 2017-09-20 ENCOUNTER — Ambulatory Visit: Payer: Medicare Other | Attending: Nurse Practitioner | Admitting: Rehabilitative and Restorative Service Providers"

## 2017-09-20 ENCOUNTER — Other Ambulatory Visit: Payer: Self-pay | Admitting: Cardiovascular Disease

## 2017-09-20 DIAGNOSIS — M79604 Pain in right leg: Secondary | ICD-10-CM | POA: Insufficient documentation

## 2017-09-20 DIAGNOSIS — M6281 Muscle weakness (generalized): Secondary | ICD-10-CM | POA: Insufficient documentation

## 2017-09-20 DIAGNOSIS — R2689 Other abnormalities of gait and mobility: Secondary | ICD-10-CM | POA: Insufficient documentation

## 2017-09-20 NOTE — Therapy (Signed)
[facility] [address] [place], [place], [address] Phone: [phone]   Fax:  [phone]  Physical Therapy Evaluation  Patient Details  Name: Tara Potts MRN: [id number] Date of Birth: 1934/05/24 Referring Provider: Cecille Rubin, NP   Encounter Date: 09/20/2017  PT End of Session - 09/20/17 1640    Visit Number  1    Number of Visits  12    Date for PT Re-Evaluation  11/19/17    Authorization Type  medicare    PT Start Time  1320    PT Stop Time  1408    PT Time Calculation (min)  48 min    Activity Tolerance  Patient tolerated treatment well;Patient limited by pain    Behavior During Therapy  [facility] for tasks assessed/performed       Past Medical History:  Diagnosis Date  . CAD (coronary artery disease)   . Carotid disease, bilateral (HCC)    mild  . Fibroid   . Hx of CABG 2007   [facility]  . Hyperlipemia   . Hypertension   . Menopausal symptoms   . Peripheral neuropathy   . Seizure-like activity [facility])     Past Surgical History:  Procedure Laterality Date  . CARDIAC CATHETERIZATION  01/09/2008   diffusely diseased graft to the PDA & diffuse PDA disease  . CAROTID DOPPLER  08/27/2011   mod. right & nild left ICA stenosis  . CATARACT SURGERY    . CORONARY ARTERY BYPASS GRAFT    . FOOT SURGERY    . MASS REMOVED FROM CERVIX  12/1997   Benign endocervical inclusion cysts  . NM MYOVIEW LTD  01/12/2011   no ischemia  . TRIPLE HEART BYPASS  05/2006   [facility]  . US ECHOCARDIOGRAPHY  10/30/2007   mild MA,MR,TR,AOV mildly sclerotic,density oted in the prox asc aorta    There were no vitals filed for this visit.   Subjective Assessment - 09/20/17 1326    Subjective  The patient notes h/o low back pain worsening in intensity with radiating symptoms to the right hip and down the right leg.  The patient feels symtpoms are getting worse and she is now having a hard time walking.  She also  notes some numbness in feet (also saw EMG in past with peripheral neuropathy).  She has undergone injection in right hip in the past, that seemed to make symptoms worse.      Pertinent History  past medical history of CAD (coronary artery disease); Carotid disease, bilateral ([place]); CABG (2007); Hyperlipemia; Hypertension;  Peripheral neuropathy; and Seizure-like activity ([place]).   Bilateral lower extremity paresthesias muscle cramping are multifactorial  including multilevel cervical degenerative disc disease, moderate central canal stenosis, along with significant lumbar degenerative disc disease with bilateral lumbar radiculopathy,  MRI 2016 Abnormal MRI scan of the lumbar spine showing prominent spondylitic changes and anterior listhesis most prominent at L4-5 where there is severe bilateral foraminal and moderate posterior canal narrowing. There are mild spondylitic changes at L3-4  resulting in severe posterior canal and mild right-sided foraminal narrowing as well.    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  Yes "I only feel the pain when I walk"    Pain Score  -- mild today in standing/ worse with extended gait    Pain Location  Back radiating R hip, groin and all the way  down    Pain Orientation  Right    Pain Descriptors / Indicators  Aching    Pain Type  Chronic pain    Pain Radiating Towards  into R hip and radiating down right leg into distal shin    Pain Onset  More than a month ago    Pain Frequency  Intermittent    Aggravating Factors   Walking    Pain Relieving Factors  Sitting         [facility] PT Assessment - 09/20/17 1336      Assessment   Medical Diagnosis  Lumbosacral radiculopathy    Referring Provider  Cecille Rubin, NP    Onset Date/Surgical Date  09/11/17    Prior Therapy  none      Precautions   Precautions  Fall    Precaution Comments  Now using a cane due to right leg radiating  symptoms      Restrictions   Weight Bearing Restrictions  No      Balance Screen   Has the patient fallen in the past 6 months  No    Has the patient had a decrease in activity level because of a fear of falling?   Yes because of pain    Is the patient reluctant to leave their home because of a fear of falling?   No      Home Environment   Living Environment  Private residence    Living Arrangements  Children daughters live with her    Type of [place] Access  Elevator;Level entry    [place] bedroom on 2nd floor    [place] - single point;Grab bars - tub/shower      Prior Function   Level of [place]  Now using a cane due to pain/weakness in right leg.      Posture/Postural Control   Posture/Postural Control  Postural limitations    Postural Limitations  Increased lumbar lordosis    Posture Comments  Forward head, increased lumbar lordosis      ROM / Strength   AROM / PROM / Strength  Strength      Strength   Overall Strength Comments  R hip flexion 3+/5, L hip flexion 4/5, Bilateral knee extension 5/5, bilateral knee flexion 5/5, R ankle Dorsiflexion 4-/5, L ankle dorsiflexion 5/5.       Flexibility   Soft Tissue Assessment /Muscle Length  yes    Hamstrings  --    ITB  Tender to palpation and tightness present in R IT band with trigger points palpated      Palpation   Palpation comment  R IT band tender to all palpation.      Transfers   Transfers  Sit to Stand    Sit to Stand  6: Modified independent (Device/Increase time) increased time due to pain      Ambulation/Gait   Ambulation/Gait  Yes    Ambulation/Gait Assistance  6: Modified independent (Device/Increase time)    Ambulation Distance (Feet)  150 Feet    Assistive device  Straight cane    Gait Pattern  Decreased stride length;Decreased weight shift to right toes out bilaterally    Ambulation Surface  Level;Indoor    Gait velocity  0.99  ft/sec    Stairs  Yes    Stairs Assistance  6: Modified independent (Device/Increase time)    Stair Management Technique  Step to  pattern;Two rails    Number of Stairs  4    Gait Comments  PT educated patient on ascending/descending steps leading with strong leg up/weak leg down.             Objective measurements completed on examination: See above findings.      [facility] Adult PT Treatment/Exercise - 09/20/17 1652      Exercises   Exercises  Knee/Hip      Knee/Hip Exercises: Stretches   ITB Stretch  Right;30 seconds    ITB Stretch Limitations  Sidelying and then standing- see HEP.             PT Education - 09/20/17 1638    Education provided  Yes    Education Details  HEP: iliotibial band stretching     Person(s) Educated  Patient    Methods  Explanation;Demonstration    Comprehension  Verbalized understanding;Returned demonstration       PT Short Term Goals - 09/20/17 1653      PT SHORT TERM GOAL #1   Title  The patient will be indep with HEP for LE stretching, strengthening, gait and low back.    Time  4    Period  Weeks    Target Date  10/20/17      PT SHORT TERM GOAL #2   Title  The patient will improve gait speed from 0.99 ft/sec to > or equal to 1.5 ft/sec to demo improving comfort with ambulation.    Time  4    Period  Weeks    Target Date  10/20/17      PT SHORT TERM GOAL #3   Title  The patient will ambulate for 5 minutes nonstop with pain remaining within 2/10 from initial baseline.    Baseline  Patient reports pain increase in standing.    Time  4    Period  Weeks    Target Date  10/20/17        PT Long Term Goals - 09/20/17 1655      PT LONG TERM GOAL #1   Title  The patient will be indep with progression of HEP.    Time  8    Period  Weeks    Target Date  11/19/17      PT LONG TERM GOAL #2   Title  The patient will improve gait speed from 0.99 ft/sec to > or equal to 1.8 ft/sec to demo dec'd fall risk.    Time  8    Period   Weeks    Target Date  11/19/17      PT LONG TERM GOAL #3   Title  The patient will negotiate 12 steps with one HR and reciprocal pattern to demo improving mobility.    Time  8    Period  Weeks    Target Date  11/19/17      PT LONG TERM GOAL #4   Title  The patient will return to household ambulation without device demonstrating improved mobility and return to prior activity level.    Time  8    Period  Weeks    Target Date  11/19/17             Plan - 09/20/17 1700    Clinical Impression Statement  The patient is an 84 yo female referred to OP therapy for lumbosacral radiculopathy with patient noting worsening symtoms x past 2-3 weeks with focal area of pain on lateral aspect of leg radiating  down the hip.  The patient was found to have point tenderness/pain along R IT band with trigger points palpated.  PT to initially focus on flexibility for R LE, strengthening, and adding in core stability activities due to h/o lumbar pathology per old imaging.  PT to emphasize return to prior functional status dec'ing dependence on assistive device.     History and Personal Factors relevant to plan of care:  PMH of CAD, carotid disease, CABG    Clinical Presentation  Evolving    Clinical Presentation due to:  now using SPC, less active, avoiding exended walking, fear of falling.    Clinical Decision Making  Moderate    Rehab Potential  Good    Clinical Impairments Affecting Rehab Potential  patient motivated to participate.    PT Frequency  2x / week    PT Duration  4 weeks then 1x/week x 4 weeks    PT Treatment/Interventions  Dry needling;ADLs/Self Care Home Management;Balance training;Neuromuscular re-education;Patient/family education;Manual techniques;Cryotherapy;Gait training;Stair training;Functional mobility training;Therapeutic activities;Therapeutic exercise;Moist Heat    PT Next Visit Plan  Check home stretching, *Dry needling (scheduled on Stephanie) for R IT band to reduce  pain/tightness, Ankle DF strengthening, LE stretching emphasizing piriformis and IT band, lumbar pelvic tilt with core stabilization, gait activities    Consulted and Agree with Plan of Care  Patient       Patient will benefit from skilled therapeutic intervention in order to improve the following deficits and impairments:  Abnormal gait, Decreased activity tolerance, Decreased strength, Increased fascial restricitons, Pain, Decreased balance, Decreased mobility, Impaired flexibility  Visit Diagnosis: Muscle weakness (generalized)  Other abnormalities of gait and mobility  Pain in right leg     Problem List Patient Active Problem List   Diagnosis Date Noted  . Carotid artery disease ([place]) 04/12/2017  . [facility]ed out [facility] 10/05/2015  . Lumbosacral radiculopathy 09/10/2014  . Cervical stenosis of spinal canal 09/10/2014  . Abnormality of gait 07/27/2014  . Paresthesia 07/27/2014  . Bilateral leg cramps 02/21/2014  . Coronary artery disease 01/13/2013  . Status post coronary artery bypass grafting: 2007 01/13/2013  . Peripheral vascular disease:  Moderate right and mild left ICA stenosis. 01/13/2013  . Essential hypertension 01/13/2013  . Elevated cholesterol   . Menopause 02/12/2011  . Leiomyoma of body of uterus 02/12/2011  . Post-menopausal atrophic vaginitis 02/12/2011  . Urinary frequency 02/12/2011  . UNSPECIFIED PERIPHERAL VASCULAR DISEASE 07/11/2010    Tara Potts, PT 09/20/2017, 5:10 PM  [facility] [address] [place], [place], [address] Phone: [phone]   Fax:  [phone]  Name: Tara Potts MRN: [id number] Date of Birth: 21-Jul-1933

## 2017-09-20 NOTE — Patient Instructions (Signed)
Iliotibial Band Stretch    Stand with right hip _12___ inches from wall. Cross other leg in front and use it and arm for support. Lean toward wall until a stretch is felt on outside of hip near wall. Keep that leg straight. Hold _30___ seconds. Repeat __3__ times. Do __2__ sessions per day.  http://gt2.exer.us/355   Copyright  VHI. All rights reserved.   Iliotibial Band Stretch, Side-Lying    Lie on left side, back to edge of bed, top arm in front. Allow top leg to drape behind over edge. Hold __30_ seconds.  Repeat _3__ times per session. Do __2_ sessions per day.  Copyright  VHI. All rights reserved.

## 2017-09-23 ENCOUNTER — Encounter: Payer: Self-pay | Admitting: Physical Therapy

## 2017-09-23 ENCOUNTER — Encounter: Payer: Medicare Other | Admitting: Gynecology

## 2017-09-23 ENCOUNTER — Ambulatory Visit: Payer: Medicare Other | Admitting: Physical Therapy

## 2017-09-23 DIAGNOSIS — M6281 Muscle weakness (generalized): Secondary | ICD-10-CM

## 2017-09-23 DIAGNOSIS — R2689 Other abnormalities of gait and mobility: Secondary | ICD-10-CM | POA: Diagnosis not present

## 2017-09-23 DIAGNOSIS — M79604 Pain in right leg: Secondary | ICD-10-CM | POA: Diagnosis not present

## 2017-09-23 NOTE — Patient Instructions (Addendum)
Piriformis Stretch    Lying on back, pull right knee toward opposite shoulder. Hold __20-30__ seconds. Repeat __2-3__ times. Do __2-3__ sessions per day.  Knee to Chest (Flexion)    Pull knee toward chest. Feel stretch in lower back or buttock area. Breathing deeply, Hold __20-30__ seconds. Repeat with other knee. Repeat __2-3__ times. Do __2-3__ sessions per day.  Outer Hip Stretch: Reclined IT Band Stretch (Strap)    Strap around right foot, pull across only as far as possible with shoulders on mat. Hold for _20-30___ seconds. Repeat __2-3__ times each leg.  Copyright  VHI. All rights reserved.     Trigger Point Dry Needling  . What is Trigger Point Dry Needling (DN)? o DN is a physical therapy technique used to treat muscle pain and dysfunction. Specifically, DN helps deactivate muscle trigger points (muscle knots).  o A thin filiform needle is used to penetrate the skin and stimulate the underlying trigger point. The goal is for a local twitch response (LTR) to occur and for the trigger point to relax. No medication of any kind is injected during the procedure.   . What Does Trigger Point Dry Needling Feel Like?  o The procedure feels different for each individual patient. Some patients report that they do not actually feel the needle enter the skin and overall the process is not painful. Very mild bleeding may occur. However, many patients feel a deep cramping in the muscle in which the needle was inserted. This is the local twitch response.   [name] How Will I feel after the treatment? o Soreness is normal, and the onset of soreness may not occur for a few hours. Typically this soreness does not last longer than two days.  o Bruising is uncommon, however; ice can be used to decrease any possible bruising.  o In rare cases feeling tired or nauseous after the treatment is normal. In addition, your symptoms may get worse before they get better, this period will typically not last  longer than 24 hours.   . What Can I do After My Treatment? o Increase your hydration by drinking more water for the next 24 hours. o You may place ice or heat on the areas treated that have become sore, however, do not use heat on inflamed or bruised areas. Heat often brings more relief post needling. o You can continue your regular activities, but vigorous activity is not recommended initially after the treatment for 24 hours. o DN is best combined with other physical therapy such as strengthening, stretching, and other therapies.

## 2017-09-23 NOTE — Therapy (Signed)
[facility] [address] [place], [place], [address] Phone: [phone]   Fax:  [phone]  Physical Therapy Treatment  Patient Details  Name: Tara Potts MRN: [id number] Date of Birth: [birth year] Referring Provider: Cecille Rubin, NP   Encounter Date: 09/23/2017  PT End of Session - 09/23/17 1119    Visit Number  2    Number of Visits  12    Date for PT Re-Evaluation  11/19/17    Authorization Type  medicare    PT Start Time  1015    PT Stop Time  1103    PT Time Calculation (min)  48 min    Activity Tolerance  Patient tolerated treatment well    Behavior During Therapy  [facility] for tasks assessed/performed       Past Medical History:  Diagnosis Date  . CAD (coronary artery disease)   . Carotid disease, bilateral (HCC)    mild  . Fibroid   . Hx of CABG 2007   [facility]  . Hyperlipemia   . Hypertension   . Menopausal symptoms   . Peripheral neuropathy   . Seizure-like activity [facility])     Past Surgical History:  Procedure Laterality Date  . CARDIAC CATHETERIZATION  01/09/2008   diffusely diseased graft to the PDA & diffuse PDA disease  . CAROTID DOPPLER  08/27/2011   mod. right & nild left ICA stenosis  . CATARACT SURGERY    . CORONARY ARTERY BYPASS GRAFT    . FOOT SURGERY    . MASS REMOVED FROM CERVIX  12/1997   Benign endocervical inclusion cysts  . NM MYOVIEW LTD  01/12/2011   no ischemia  . TRIPLE HEART BYPASS  05/2006   [facility]  . US ECHOCARDIOGRAPHY  10/30/2007   mild MA,MR,TR,AOV mildly sclerotic,density oted in the prox asc aorta    There were no vitals filed for this visit.  Subjective Assessment - 09/23/17 1014    Subjective  doing the exercise "as best I can."  felt pretty good yesterday, but today feels a little "weaker"    Pertinent History  past medical history of CAD (coronary artery disease); Carotid disease, bilateral ([place]); CABG (2007); Hyperlipemia; Hypertension;   Peripheral neuropathy; and Seizure-like activity ([place]).   Bilateral lower extremity paresthesias muscle cramping are multifactorial  including multilevel cervical degenerative disc disease, moderate central canal stenosis, along with significant lumbar degenerative disc disease with bilateral lumbar radiculopathy,  MRI 2016 Abnormal MRI scan of the lumbar spine showing prominent spondylitic changes and anterior listhesis most prominent at L4-5 where there is severe bilateral foraminal and moderate posterior canal narrowing. There are mild spondylitic changes at L3-4  resulting in severe posterior canal and mild right-sided foraminal narrowing as well.    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  No/denies                      [facility] Adult PT Treatment/Exercise - 09/23/17 1051      Knee/Hip Exercises: Stretches   ITB Stretch  Right;30 seconds    ITB Stretch Limitations  demonstrated supine with sheet as pt reported difficulty with initial HEP    Piriformis Stretch  Right;2 reps;30 seconds    Other Knee/Hip Stretches  single knee to chest Rt 2x30 sec      Knee/Hip Exercises: Aerobic   Nustep  L4 x  8 min      Manual Therapy   Manual Therapy  Soft tissue mobilization;Myofascial release    Manual therapy comments  skilled palpation of soft tissue during DN    Soft tissue mobilization  Rt lateral quads/hamstrings/glute med/min and piriformis    Myofascial Release  Rt lateral quad/hamstrings       Trigger Point Dry Needling - 09/23/17 1116    Consent Given?  Yes    Education Handout Provided  No verbally educated; will provide handout next session    Muscles Treated Lower Body  Gluteus minimus;Quadriceps;Hamstring    Gluteus Minimus Response  Twitch response elicited;Palpable increased muscle length and glute med    Quadriceps Response  Twitch response elicited;Palpable increased muscle length     Hamstring Response  Twitch response elicited;Palpable increased muscle length           PT Education - 09/23/17 1118    Education provided  Yes    Education Details  HEP, DN (verbally reviewed)    Person(s) Educated  Patient    Methods  Explanation;Demonstration;Handout    Comprehension  Verbalized understanding;Returned demonstration;Need further instruction       PT Short Term Goals - 09/20/17 1653      PT SHORT TERM GOAL #1   Title  The patient will be indep with HEP for LE stretching, strengthening, gait and low back.    Time  4    Period  Weeks    Target Date  10/20/17      PT SHORT TERM GOAL #2   Title  The patient will improve gait speed from 0.99 ft/sec to > or equal to 1.5 ft/sec to demo improving comfort with ambulation.    Time  4    Period  Weeks    Target Date  10/20/17      PT SHORT TERM GOAL #3   Title  The patient will ambulate for 5 minutes nonstop with pain remaining within 2/10 from initial baseline.    Baseline  Patient reports pain increase in standing.    Time  4    Period  Weeks    Target Date  10/20/17        PT Long Term Goals - 09/20/17 1655      PT LONG TERM GOAL #1   Title  The patient will be indep with progression of HEP.    Time  8    Period  Weeks    Target Date  11/19/17      PT LONG TERM GOAL #2   Title  The patient will improve gait speed from 0.99 ft/sec to > or equal to 1.8 ft/sec to demo dec'd fall risk.    Time  8    Period  Weeks    Target Date  11/19/17      PT LONG TERM GOAL #3   Title  The patient will negotiate 12 steps with one HR and reciprocal pattern to demo improving mobility.    Time  8    Period  Weeks    Target Date  11/19/17      PT LONG TERM GOAL #4   Title  The patient will return to household ambulation without device demonstrating improved mobility and return to prior activity level.    Time  8    Period  Weeks    Target Date  11/19/17            Plan - 09/23/17 1119    Clinical  Impression Statement  Pt presented today without c/o pain at time of session, but still having RLE radicular symptoms.  Pt with trigger points in lateral quad and hamstring; as well as glute med and min.  DN and manual therapy performed to these areas today with decrease in pain following.  Added stretches to HEP, and modified ITB stretch due to pt having difficulty with initial ones.  Will continue to benefit from PT to maximize function.    PT Treatment/Interventions  Dry needling;ADLs/Self Care Home Management;Balance training;Neuromuscular re-education;Patient/family education;Manual techniques;Cryotherapy;Gait training;Stair training;Functional mobility training;Therapeutic activities;Therapeutic exercise;Moist Heat    PT Next Visit Plan  review HEP and assess response to DN, needs body mechanics review,  Ankle DF strengthening, LE stretching emphasizing piriformis and IT band, lumbar pelvic tilt with core stabilization, gait activities    Consulted and Agree with Plan of Care  Patient       Patient will benefit from skilled therapeutic intervention in order to improve the following deficits and impairments:  Abnormal gait, Decreased activity tolerance, Decreased strength, Increased fascial restricitons, Pain, Decreased balance, Decreased mobility, Impaired flexibility  Visit Diagnosis: Muscle weakness (generalized)  Other abnormalities of gait and mobility  Pain in right leg     Problem List Patient Active Problem List   Diagnosis Date Noted  . Carotid artery disease ([place]) 04/12/2017  . Passed out [facility]) 10/05/2015  . Lumbosacral radiculopathy 09/10/2014  . Cervical stenosis of spinal canal 09/10/2014  . Abnormality of gait 07/27/2014  . Paresthesia 07/27/2014  . Bilateral leg cramps 02/21/2014  . Coronary artery disease 01/13/2013  . Status post coronary artery bypass grafting: 2007 01/13/2013  . Peripheral vascular disease:  Moderate right and mild left ICA stenosis. 01/13/2013   . Essential hypertension 01/13/2013  . Elevated cholesterol   . Menopause 02/12/2011  . Leiomyoma of body of uterus 02/12/2011  . Post-menopausal atrophic vaginitis 02/12/2011  . Urinary frequency 02/12/2011  . UNSPECIFIED PERIPHERAL VASCULAR DISEASE 07/11/2010       Laureen Abrahams, PT, DPT 09/23/17 11:23 AM    [facility] [address] [place], [place], [address] Phone: [phone]   Fax:  [phone]  Name: NICOLLE HEWARD MRN: [id number] Date of Birth: 1933-10-14

## 2017-09-25 ENCOUNTER — Other Ambulatory Visit: Payer: Self-pay | Admitting: Cardiovascular Disease

## 2017-09-26 ENCOUNTER — Encounter: Payer: Self-pay | Admitting: Gynecology

## 2017-09-26 ENCOUNTER — Ambulatory Visit (INDEPENDENT_AMBULATORY_CARE_PROVIDER_SITE_OTHER): Payer: Medicare Other | Admitting: Gynecology

## 2017-09-26 VITALS — BP 150/78 | Ht 66.0 in | Wt 198.0 lb

## 2017-09-26 DIAGNOSIS — Z9189 Other specified personal risk factors, not elsewhere classified: Secondary | ICD-10-CM

## 2017-09-26 DIAGNOSIS — Z779 Other contact with and (suspected) exposures hazardous to health: Secondary | ICD-10-CM

## 2017-09-26 DIAGNOSIS — Z79890 Hormone replacement therapy (postmenopausal): Secondary | ICD-10-CM

## 2017-09-26 DIAGNOSIS — N952 Postmenopausal atrophic vaginitis: Secondary | ICD-10-CM

## 2017-09-26 DIAGNOSIS — Z01411 Encounter for gynecological examination (general) (routine) with abnormal findings: Secondary | ICD-10-CM | POA: Diagnosis not present

## 2017-09-26 MED ORDER — ESTRADIOL-NORETHINDRONE ACET 0.05-0.14 MG/DAY TD PTTW: MEDICATED_PATCH | TRANSDERMAL | 2 refills | Status: DC

## 2017-09-26 NOTE — Progress Notes (Signed)
    SHOLANDA CROSON 04-Sep-1933 [id number]        84 y.o.  G1P1001 for breast and pelvic exam.  Past medical history,surgical history, problem list, medications, allergies, family history and social history were all reviewed and documented as reviewed in the EPIC chart.  ROS:  Performed with pertinent positives and negatives included in the history, assessment and plan.   Additional significant findings : None   Exam: Wandra Scot assistant Vitals:   09/26/17 1525  BP: (!) 150/78  Weight: 198 lb (89.8 kg)  Height: 5\' 6"  (1.676 m)   Body mass index is 31.96 kg/m.  General appearance:  Normal affect, orientation and appearance. Skin: Grossly normal HEENT: Without gross lesions.  No cervical or supraclavicular adenopathy. Thyroid normal.  Lungs:  Clear without wheezing, rales or rhonchi Cardiac: RR, without RMG Abdominal:  Soft, nontender, without masses, guarding, rebound, organomegaly or hernia Breasts:  Examined lying and sitting without masses, retractions, discharge or axillary adenopathy. Pelvic:  Ext, BUS, Vagina: With atrophic changes  Cervix: With atrophic changes  Uterus: Anteverted, normal size, shape and contour, midline and mobile nontender   Adnexa: Without masses or tenderness    Anus and perineum: Normal   Rectovaginal: Normal sphincter tone without palpated masses or tenderness.    Assessment/Plan:  84 y.o. G90P1001 female for breast and pelvic exam.   1. Menopausal/atrophic genital changes/HRT.  Patient continues on CombiPatch.  Had tried stopping but did not feel well.  She actually is using 1 patch per month and said that is just enough to make her feel better.  I reviewed with her again the risks versus benefits to include thrombosis such as stroke heart attack DVT and breast cancer issue.  I also reviewed with her realistically her age and whether we should not wean totally now.  Also has a history of coronary artery bypass and carotid issues.  Thrombosis,  although using a patch which should decrease this, is still a realistic risk.  Not a lot of data of HRT in patients in their 27s discussed.  At this point the patient would prefer to continue stating it is a quality of life issue and she feels better using 1 patch per month.  She clearly understands the issues and risks.  Refill times 1 year provided. 2. Mammography 2014.  I again strongly recommended patient schedule a mammogram and she agrees to do so.  Patient states that she was just being lazy but will go ahead and schedule this.  Breast exam normal today. 3. Colonoscopy never.  Patient refuses to schedule colonoscopy. 4. Pap smear 2015.  No Pap smear done today.  No history of abnormal Pap smears.  We both agree to stop screening per current screening guidelines based on age. 5. DEXA is followed through Dr. [name] office.  She will continue to see him in reference to bone health. 6. Health maintenance.  Blood pressure noted to the patient at 150/78.  She will have rechecked in a non-exam situation.  No routine lab work done as patient does this elsewhere.  Follow-up 1 year, sooner as needed.   Anastasio Auerbach MD, 3:54 PM 09/26/2017

## 2017-09-26 NOTE — Patient Instructions (Signed)
Call to Schedule your mammogram  Facilities in [place]: 1)  [facility]. [facility], [address] Phone: [phone] 2)  Dr. Bertrand at [facility]  [address] Phone: [phone]     Mammogram A mammogram is an X-ray test to find changes in a woman's breast. You should get a mammogram if:  You are 82 years of age or older  You have risk factors.   Your doctor recommends that you have one.  BEFORE THE TEST  Do not schedule the test the week before your period, especially if your breasts are sore during this time.  On the day of your mammogram:  Wash your breasts and armpits well. After washing, do not put on any deodorant or talcum powder on until after your test.   Eat and drink as you usually do.   Take your medicines as usual.   If you are diabetic and take insulin, make sure you:   Eat before coming for your test.   Take your insulin as usual.   If you cannot keep your appointment, call before the appointment to cancel. Schedule another appointment.  TEST  You will need to undress from the waist up. You will put on a hospital gown.   Your breast will be put on the mammogram machine, and it will press firmly on your breast with a piece of plastic called a compression paddle. This will make your breast flatter so that the machine can X-ray all parts of your breast.   Both breasts will be X-rayed. Each breast will be X-rayed from above and from the side. An X-ray might need to be taken again if the picture is not good enough.   The mammogram will last about 15 to 30 minutes.  AFTER THE TEST Finding out the results of your test Ask when your test results will be ready. Make sure you get your test results.  Document Released: 09/28/2008 Document Revised: 06/21/2011 Document Reviewed: 09/28/2008 ExitCare Patient Information 2012 [employer].   

## 2017-09-30 ENCOUNTER — Encounter: Payer: Self-pay | Admitting: Physical Therapy

## 2017-09-30 ENCOUNTER — Ambulatory Visit: Payer: Medicare Other | Admitting: Physical Therapy

## 2017-09-30 DIAGNOSIS — M6281 Muscle weakness (generalized): Secondary | ICD-10-CM

## 2017-09-30 DIAGNOSIS — M79604 Pain in right leg: Secondary | ICD-10-CM

## 2017-09-30 DIAGNOSIS — R2689 Other abnormalities of gait and mobility: Secondary | ICD-10-CM | POA: Diagnosis not present

## 2017-09-30 NOTE — Therapy (Signed)
[facility] [address] [place], [place], [address] Phone: [phone]   Fax:  [phone]  Physical Therapy Treatment  Patient Details  Name: Tara Potts MRN: [id number] Date of Birth: [birth year] Referring Provider: Cecille Rubin, NP   Encounter Date: 09/30/2017  PT End of Session - 09/30/17 1444    Visit Number  3    Number of Visits  12    Date for PT Re-Evaluation  11/19/17    Authorization Type  medicare    PT Start Time  1322 pt arrived late    PT Stop Time  1356    PT Time Calculation (min)  34 min    Activity Tolerance  Patient tolerated treatment well;Patient limited by fatigue    Behavior During Therapy  [facility] for tasks assessed/performed       Past Medical History:  Diagnosis Date  . CAD (coronary artery disease)   . Carotid disease, bilateral (HCC)    mild  . Fibroid   . Hx of CABG 2007   [facility]  . Hyperlipemia   . Hypertension   . Menopausal symptoms   . Peripheral neuropathy   . Seizure-like activity [facility])     Past Surgical History:  Procedure Laterality Date  . CARDIAC CATHETERIZATION  01/09/2008   diffusely diseased graft to the PDA & diffuse PDA disease  . CAROTID DOPPLER  08/27/2011   mod. right & nild left ICA stenosis  . CATARACT SURGERY    . CORONARY ARTERY BYPASS GRAFT    . FOOT SURGERY    . MASS REMOVED FROM CERVIX  12/1997   Benign endocervical inclusion cysts  . NM MYOVIEW LTD  01/12/2011   no ischemia  . TRIPLE HEART BYPASS  05/2006   [facility]  . US ECHOCARDIOGRAPHY  10/30/2007   mild MA,MR,TR,AOV mildly sclerotic,density oted in the prox asc aorta    There were no vitals filed for this visit.  Subjective Assessment - 09/30/17 1326    Subjective  feels RLE is improved; "it's a little better and hurts when I do the exerises."  further elaborated to express stretching sensation with exercises.  feels like DN helped, doesn't think she needs it today but  isn't sure.    Pertinent History  past medical history of CAD (coronary artery disease); Carotid disease, bilateral ([place]); CABG (2007); Hyperlipemia; Hypertension;  Peripheral neuropathy; and Seizure-like activity ([place]).   Bilateral lower extremity paresthesias muscle cramping are multifactorial  including multilevel cervical degenerative disc disease, moderate central canal stenosis, along with significant lumbar degenerative disc disease with bilateral lumbar radiculopathy,  MRI 2016 Abnormal MRI scan of the lumbar spine showing prominent spondylitic changes and anterior listhesis most prominent at L4-5 where there is severe bilateral foraminal and moderate posterior canal narrowing. There are mild spondylitic changes at L3-4  resulting in severe posterior canal and mild right-sided foraminal narrowing as well.    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  No/denies                      [facility] Adult PT Treatment/Exercise - 09/30/17 1329      Knee/Hip Exercises: Stretches   ITB Stretch  Both;2 reps;30 seconds    Piriformis Stretch  Both;2 reps;30 seconds    Other Knee/Hip Stretches  single knee to chest Rt, Lt 2x30 sec  Knee/Hip Exercises: Aerobic   Nustep  L4 x 8 min      Knee/Hip Exercises: Supine   Bridges  Both;10 reps 5 seconds, incr time needed due to fatigue    Straight Leg Raises  Both;10 reps difficulty with Rt; incr time needed             PT Education - 09/30/17 1400    Education provided  Yes    Education Details  strengthening HEP    Person(s) Educated  Patient    Methods  Explanation;Demonstration    Comprehension  Verbalized understanding;Returned demonstration;Need further instruction       PT Short Term Goals - 09/20/17 1653      PT SHORT TERM GOAL #1   Title  The patient will be indep with HEP for LE stretching, strengthening, gait and low back.    Time   4    Period  Weeks    Target Date  10/20/17      PT SHORT TERM GOAL #2   Title  The patient will improve gait speed from 0.99 ft/sec to > or equal to 1.5 ft/sec to demo improving comfort with ambulation.    Time  4    Period  Weeks    Target Date  10/20/17      PT SHORT TERM GOAL #3   Title  The patient will ambulate for 5 minutes nonstop with pain remaining within 2/10 from initial baseline.    Baseline  Patient reports pain increase in standing.    Time  4    Period  Weeks    Target Date  10/20/17        PT Long Term Goals - 09/20/17 1655      PT LONG TERM GOAL #1   Title  The patient will be indep with progression of HEP.    Time  8    Period  Weeks    Target Date  11/19/17      PT LONG TERM GOAL #2   Title  The patient will improve gait speed from 0.99 ft/sec to > or equal to 1.8 ft/sec to demo dec'd fall risk.    Time  8    Period  Weeks    Target Date  11/19/17      PT LONG TERM GOAL #3   Title  The patient will negotiate 12 steps with one HR and reciprocal pattern to demo improving mobility.    Time  8    Period  Weeks    Target Date  11/19/17      PT LONG TERM GOAL #4   Title  The patient will return to household ambulation without device demonstrating improved mobility and return to prior activity level.    Time  8    Period  Weeks    Target Date  11/19/17            Plan - 09/30/17 1444    Clinical Impression Statement  Pt with poor tolerance to strengthening exercises today needing frequent rest breaks and only able to complete 2 exercises to add to HEP.  Reports pain improved overall at this time and had a good response to DN.    PT Treatment/Interventions  Dry needling;ADLs/Self Care Home Management;Balance training;Neuromuscular re-education;Patient/family education;Manual techniques;Cryotherapy;Gait training;Stair training;Functional mobility training;Therapeutic activities;Therapeutic exercise;Moist Heat    PT Next Visit Plan  review HEP and add  additional strengthening exercises as able. needs body mechanics review,  Ankle DF strengthening, LE stretching  emphasizing piriformis and IT band, lumbar pelvic tilt with core stabilization, gait activities    Consulted and Agree with Plan of Care  Patient       Patient will benefit from skilled therapeutic intervention in order to improve the following deficits and impairments:  Abnormal gait, Decreased activity tolerance, Decreased strength, Increased fascial restricitons, Pain, Decreased balance, Decreased mobility, Impaired flexibility  Visit Diagnosis: Muscle weakness (generalized)  Other abnormalities of gait and mobility  Pain in right leg     Problem List Patient Active Problem List   Diagnosis Date Noted  . Carotid artery disease ([place]) 04/12/2017  . Passed out [facility]) 10/05/2015  . Lumbosacral radiculopathy 09/10/2014  . Cervical stenosis of spinal canal 09/10/2014  . Abnormality of gait 07/27/2014  . Paresthesia 07/27/2014  . Bilateral leg cramps 02/21/2014  . Coronary artery disease 01/13/2013  . Status post coronary artery bypass grafting: 2007 01/13/2013  . Peripheral vascular disease:  Moderate right and mild left ICA stenosis. 01/13/2013  . Essential hypertension 01/13/2013  . Elevated cholesterol   . Menopause 02/12/2011  . Leiomyoma of body of uterus 02/12/2011  . Post-menopausal atrophic vaginitis 02/12/2011  . Urinary frequency 02/12/2011  . UNSPECIFIED PERIPHERAL VASCULAR DISEASE 07/11/2010      Laureen Abrahams, PT, DPT 09/30/17 2:46 PM    [place] [address] [place], [place], [address] Phone: [phone]   Fax:  [phone]  Name: Tara Potts MRN: [id number] Date of Birth: 11/12/1933

## 2017-09-30 NOTE — Patient Instructions (Signed)
Bridging    Slowly raise buttocks from floor, keeping stomach tight.   Hold for 5 seconds. Repeat __10__ times per set. Do __1__ sets per session. Do _1-2___ sessions per day.   Straight Leg Raise    Slowly raise locked right leg __6-8__ inches from floor. Repeat __10__ times per set. Do __1__ sets per session. Do __1-2__ sessions per day.

## 2017-10-07 ENCOUNTER — Ambulatory Visit: Payer: Medicare Other | Admitting: Rehabilitative and Restorative Service Providers"

## 2017-10-07 ENCOUNTER — Encounter: Payer: Self-pay | Admitting: Rehabilitative and Restorative Service Providers"

## 2017-10-07 DIAGNOSIS — R2689 Other abnormalities of gait and mobility: Secondary | ICD-10-CM | POA: Diagnosis not present

## 2017-10-07 DIAGNOSIS — M79604 Pain in right leg: Secondary | ICD-10-CM

## 2017-10-07 DIAGNOSIS — M6281 Muscle weakness (generalized): Secondary | ICD-10-CM | POA: Diagnosis not present

## 2017-10-07 NOTE — Therapy (Signed)
[place] [address] [place], [place], [address] Phone: [phone]   Fax:  [phone]  Physical Therapy Treatment  Patient Details  Name: Tara Potts MRN: [id number] Date of Birth: [birth year] Referring Provider: Cecille Rubin, NP   Encounter Date: 10/07/2017  PT End of Session - 10/07/17 1014    Visit Number  4    Number of Visits  12    Date for PT Re-Evaluation  11/19/17    Authorization Type  medicare    PT Start Time  0932    PT Stop Time  1014    PT Time Calculation (min)  42 min    Activity Tolerance  Patient tolerated treatment well;Patient limited by fatigue    Behavior During Therapy  [facility] for tasks assessed/performed       Past Medical History:  Diagnosis Date  . CAD (coronary artery disease)   . Carotid disease, bilateral (HCC)    mild  . Fibroid   . Hx of CABG 2007   [facility]  . Hyperlipemia   . Hypertension   . Menopausal symptoms   . Peripheral neuropathy   . Seizure-like activity [facility])     Past Surgical History:  Procedure Laterality Date  . CARDIAC CATHETERIZATION  01/09/2008   diffusely diseased graft to the PDA & diffuse PDA disease  . CAROTID DOPPLER  08/27/2011   mod. right & nild left ICA stenosis  . CATARACT SURGERY    . CORONARY ARTERY BYPASS GRAFT    . FOOT SURGERY    . MASS REMOVED FROM CERVIX  12/1997   Benign endocervical inclusion cysts  . NM MYOVIEW LTD  01/12/2011   no ischemia  . TRIPLE HEART BYPASS  05/2006   [facility]  . US ECHOCARDIOGRAPHY  10/30/2007   mild MA,MR,TR,AOV mildly sclerotic,density oted in the prox asc aorta    There were no vitals filed for this visit.  Subjective Assessment - 10/07/17 0935    Subjective  The patient reports that she is trying new shoes that her granddaughter sent her (athletic slip ons).  The patient notes last night she was able to walk in the new shoes without pain and able to stand up straight.  Her feet  also are swollen per report (shoes look too small in length.      Pertinent History  past medical history of CAD (coronary artery disease); Carotid disease, bilateral ([place]); CABG (2007); Hyperlipemia; Hypertension;  Peripheral neuropathy; and Seizure-like activity ([place]).   Bilateral lower extremity paresthesias muscle cramping are multifactorial  including multilevel cervical degenerative disc disease, moderate central canal stenosis, along with significant lumbar degenerative disc disease with bilateral lumbar radiculopathy,  MRI 2016 Abnormal MRI scan of the lumbar spine showing prominent spondylitic changes and anterior listhesis most prominent at L4-5 where there is severe bilateral foraminal and moderate posterior canal narrowing. There are mild spondylitic changes at L3-4  resulting in severe posterior canal and mild right-sided foraminal narrowing as well.    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  Yes    Pain Score  5     Pain Location  Hip    Pain Orientation  Right    Pain Descriptors / Indicators  Aching    Pain Type  Chronic pain    Pain Onset  More than a month ago    Pain Frequency  Intermittent  Aggravating Factors   worse with standing, walking *more localized today in groin region not radiating down into IT band region, feels sore with rubbing it    Pain Relieving Factors  sitting, rest; thinks new shoes are helping (although not safe for gait)    Multiple Pain Sites  No           [facility] Adult PT Treatment/Exercise - 10/07/17 0940      Self-Care   Self-Care  Other Self-Care Comments    Other Self-Care Comments   Discussed safety with shoewear recommending a shoe that cannot slide off during ambulation.      Exercises   Exercises  Knee/Hip      Knee/Hip Exercises: Stretches   Passive Hamstring Stretch  1 rep;Right;Left    Passive Hamstring Stretch Limitations  Good hamstring  length noted    Quad Stretch  3 reps;30 seconds    Quad Stretch Limitations  prone positioning with overpressure and contract/relax    Piriformis Stretch  2 reps;Right;30 seconds    Piriformis Stretch Limitations  limited in supine stretch due to dec'd hip ER    Other Knee/Hip Stretches  Single knee to chest    Other Knee/Hip Stretches  Passive hip IR/ER with contract relax; hip adduction stretching      Knee/Hip Exercises: Supine   Quad Sets  5 reps    Quad Sets Limitations  with tactile cues R side only    Heel Slides  10 reps    Heel Slides Limitations  Some discomfort noted stating a "stretching" sensation    Bridges  Both;5 reps    Bridges Limitations  Was noting hamstring cramp left side    Straight Leg Raises  Right    Straight Leg Raises Limitations  Unable to perform today.  Discussed this as part of HEP and modified as she is not performing and struggled with today.    Other Supine Knee/Hip Exercises  Supine marching x 10 reps      Knee/Hip Exercises: Sidelying   Hip ABduction  10 reps    Hip ABduction Limitations  Modified for patient to reduce range of motion by adding pillow under foot    Clams  10 reps with cues to reduce trunk leaning.    Other Sidelying Knee/Hip Exercises  Hip IR in sidelying of R hip x 10 reps      Knee/Hip Exercises: Prone   Other Prone Exercises  Contract/relax in order to help facilitate greater hip IR/ER.             PT Education - 10/07/17 1013    Education provided  Yes    Education Details  HEP: modified strengthening program to remove SLR and add supine marches and heel slides    Person(s) Educated  Patient    Methods  Explanation;Demonstration;Handout    Comprehension  Verbalized understanding;Returned demonstration       PT Short Term Goals - 09/20/17 1653      PT SHORT TERM GOAL #1   Title  The patient will be indep with HEP for LE stretching, strengthening, gait and low back.    Time  4    Period  Weeks    Target Date   10/20/17      PT SHORT TERM GOAL #2   Title  The patient will improve gait speed from 0.99 ft/sec to > or equal to 1.5 ft/sec to demo improving comfort with ambulation.    Time  4    Period  Weeks    Target Date  10/20/17      PT SHORT TERM GOAL #3   Title  The patient will ambulate for 5 minutes nonstop with pain remaining within 2/10 from initial baseline.    Baseline  Patient reports pain increase in standing.    Time  4    Period  Weeks    Target Date  10/20/17        PT Long Term Goals - 09/20/17 1655      PT LONG TERM GOAL #1   Title  The patient will be indep with progression of HEP.    Time  8    Period  Weeks    Target Date  11/19/17      PT LONG TERM GOAL #2   Title  The patient will improve gait speed from 0.99 ft/sec to > or equal to 1.8 ft/sec to demo dec'd fall risk.    Time  8    Period  Weeks    Target Date  11/19/17      PT LONG TERM GOAL #3   Title  The patient will negotiate 12 steps with one HR and reciprocal pattern to demo improving mobility.    Time  8    Period  Weeks    Target Date  11/19/17      PT LONG TERM GOAL #4   Title  The patient will return to household ambulation without device demonstrating improved mobility and return to prior activity level.    Time  8    Period  Weeks    Target Date  11/19/17            Plan - 10/07/17 1220    Clinical Impression Statement  The patient has dec'd tolerance to SLRs today with weakness limiting anti-gravity movements.  PT modified HEP and emphasized need to continued stretching and strengthening to address deficits.  Patient notes pain more localized at lateral R hip extending into groin region.     PT Treatment/Interventions  Dry needling;ADLs/Self Care Home Management;Balance training;Neuromuscular re-education;Patient/family education;Manual techniques;Cryotherapy;Gait training;Stair training;Functional mobility training;Therapeutic activities;Therapeutic exercise;Moist Heat    PT Next Visit  Plan  review HEP and add additional strengthening exercises as able. needs body mechanics review,  Ankle DF strengthening, LE stretching emphasizing piriformis and IT band, lumbar pelvic tilt with core stabilization, gait activities       Patient will benefit from skilled therapeutic intervention in order to improve the following deficits and impairments:  Abnormal gait, Decreased activity tolerance, Decreased strength, Increased fascial restricitons, Pain, Decreased balance, Decreased mobility, Impaired flexibility  Visit Diagnosis: Muscle weakness (generalized)  Other abnormalities of gait and mobility  Pain in right leg     Problem List Patient Active Problem List   Diagnosis Date Noted  . Carotid artery disease ([place]) 04/12/2017  . Passed out [facility]) 10/05/2015  . Lumbosacral radiculopathy 09/10/2014  . Cervical stenosis of spinal canal 09/10/2014  . Abnormality of gait 07/27/2014  . Paresthesia 07/27/2014  . Bilateral leg cramps 02/21/2014  . Coronary artery disease 01/13/2013  . Status post coronary artery bypass grafting: 2007 01/13/2013  . Peripheral vascular disease:  Moderate right and mild left ICA stenosis. 01/13/2013  . Essential hypertension 01/13/2013  . Elevated cholesterol   . Menopause 02/12/2011  . Leiomyoma of body of uterus 02/12/2011  . Post-menopausal atrophic vaginitis 02/12/2011  . Urinary frequency 02/12/2011  . UNSPECIFIED PERIPHERAL VASCULAR DISEASE 07/11/2010    Tara Potts, PT 10/07/2017, 12:21 PM  [place] [address] [place], [place], [address] Phone: [phone]   Fax:  [phone]  Name: Tara Potts MRN: [id number] Date of Birth: Apr 06, 1934

## 2017-10-07 NOTE — Patient Instructions (Signed)
Piriformis Stretch    Lying on back, pull right knee toward opposite shoulder. Hold __20-30__ seconds. Repeat __2-3__ times. Do __2-3__ sessions per day.  Knee to Chest (Flexion)    Pull knee toward chest. Feel stretch in lower back or buttock area. Breathing deeply, Hold __20-30__ seconds. Repeat with other knee. Repeat __2-3__ times. Do __2-3__ sessions per day.  Outer Hip Stretch: Reclined IT Band Stretch (Strap)    Strap around right foot, pull across only as far as possible with shoulders on mat. Hold for _20-30___ seconds. Repeat __2-3__ times each leg.  Copyright  VHI. All rights reserved.   Bridging    Slowly raise buttocks from floor, keeping stomach tight.   Hold for 5 seconds. Repeat __10__ times per set. Do __1__ sets per session. Do _1-2___ sessions per day.   Heel Slide    Bend knee and pull heel toward buttocks. Hold __5__ seconds. Return. Repeat with other knee. Repeat __10__ times. Do __1-2__ sessions per day.  http://gt2.exer.us/372   Copyright  VHI. All rights reserved.   Hip Flexion - Supine    Lying on back, knees bent, feet on floor, bend hips, bringing knees toward trunk. Repeat _10__ times. Do _1-2__ times per day.  Copyright  VHI. All rights reserved.

## 2017-10-11 ENCOUNTER — Other Ambulatory Visit: Payer: Self-pay

## 2017-10-11 MED ORDER — LOSARTAN POTASSIUM-HCTZ 50-12.5 MG PO TABS: 1.0000 | ORAL_TABLET | Freq: Every day | ORAL | 3 refills | Status: DC

## 2017-10-14 ENCOUNTER — Encounter: Payer: Self-pay | Admitting: Physical Therapy

## 2017-10-14 ENCOUNTER — Telehealth: Payer: Self-pay | Admitting: Cardiovascular Disease

## 2017-10-14 ENCOUNTER — Ambulatory Visit: Payer: Medicare Other | Attending: Nurse Practitioner | Admitting: Physical Therapy

## 2017-10-14 DIAGNOSIS — M79604 Pain in right leg: Secondary | ICD-10-CM | POA: Diagnosis not present

## 2017-10-14 DIAGNOSIS — M6281 Muscle weakness (generalized): Secondary | ICD-10-CM | POA: Diagnosis not present

## 2017-10-14 DIAGNOSIS — R2689 Other abnormalities of gait and mobility: Secondary | ICD-10-CM

## 2017-10-14 MED ORDER — HYDROCHLOROTHIAZIDE 12.5 MG PO CAPS: 12.5000 mg | ORAL_CAPSULE | Freq: Every day | ORAL | 1 refills | Status: DC

## 2017-10-14 MED ORDER — LOSARTAN POTASSIUM 50 MG PO TABS: 50.0000 mg | ORAL_TABLET | Freq: Every day | ORAL | 1 refills | Status: DC

## 2017-10-14 NOTE — Telephone Encounter (Signed)
No problem  Rx for losartan and HCTZ sent to pharmacy

## 2017-10-14 NOTE — Telephone Encounter (Signed)
New Message:    Pharmacy calling losartan-hydrochlorothiazide (HYZAAR) 50-12.5 MG tablet is on a manufactured back order and no one in the area has it. Pharmacy would like to know if they can split medication losartan 50 mg 1 tablet a day and hctz 12.5 1 tablet a day.

## 2017-10-14 NOTE — Therapy (Signed)
[facility] [address] [place], [place], [address] Phone: [phone]   Fax:  [phone]  Physical Therapy Treatment  Patient Details  Name: Tara Potts MRN: [id number] Date of Birth: 08/22/1933 Referring Provider: Cecille Rubin, NP   Encounter Date: 10/14/2017  PT End of Session - 10/14/17 1113    Visit Number  5    Number of Visits  12    Date for PT Re-Evaluation  11/19/17    Authorization Type  medicare    PT Start Time  1025    PT Stop Time  1103    PT Time Calculation (min)  38 min    Activity Tolerance  Patient tolerated treatment well;Patient limited by fatigue    Behavior During Therapy  [facility] for tasks assessed/performed       Past Medical History:  Diagnosis Date  . CAD (coronary artery disease)   . Carotid disease, bilateral (HCC)    mild  . Fibroid   . Hx of CABG 2007   [facility]  . Hyperlipemia   . Hypertension   . Menopausal symptoms   . Peripheral neuropathy   . Seizure-like activity [facility])     Past Surgical History:  Procedure Laterality Date  . CARDIAC CATHETERIZATION  01/09/2008   diffusely diseased graft to the PDA & diffuse PDA disease  . CAROTID DOPPLER  08/27/2011   mod. right & nild left ICA stenosis  . CATARACT SURGERY    . CORONARY ARTERY BYPASS GRAFT    . FOOT SURGERY    . MASS REMOVED FROM CERVIX  12/1997   Benign endocervical inclusion cysts  . NM MYOVIEW LTD  01/12/2011   no ischemia  . TRIPLE HEART BYPASS  05/2006   [facility]  . US ECHOCARDIOGRAPHY  10/30/2007   mild MA,MR,TR,AOV mildly sclerotic,density oted in the prox asc aorta    There were no vitals filed for this visit.  Subjective Assessment - 10/14/17 1025    Subjective  No falls. She has done some of exercises but fatigued.    Pertinent History  past medical history of CAD (coronary artery disease); Carotid disease, bilateral ([place]); CABG (2007); Hyperlipemia; Hypertension;  Peripheral  neuropathy; and Seizure-like activity ([place]).   Bilateral lower extremity paresthesias muscle cramping are multifactorial  including multilevel cervical degenerative disc disease, moderate central canal stenosis, along with significant lumbar degenerative disc disease with bilateral lumbar radiculopathy,  MRI 2016 Abnormal MRI scan of the lumbar spine showing prominent spondylitic changes and anterior listhesis most prominent at L4-5 where there is severe bilateral foraminal and moderate posterior canal narrowing. There are mild spondylitic changes at L3-4  resulting in severe posterior canal and mild right-sided foraminal narrowing as well.    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  Yes    Pain Score  4  since last PT visit stays same 4/10    Pain Location  Hip    Pain Orientation  Right    Pain Descriptors / Indicators  Aching    Pain Type  Chronic pain    Pain Onset  More than a month ago    Pain Frequency  Constant    Aggravating Factors   worse with standing,     Pain Relieving Factors  sitting, rest       Self-Care PT instructed & demonstrated sit to supine & supine to sit via  sidelying to decrease strain on back & hip. Pt return demo understanding. PT instructed in using folded pillow in bottom corners of bed under flat sheet & blankets to decrease resistance of sheets with position changes in bed.  PT demo, instructed in use of pillow between LEs, under upper arm and head pillow positioning for support.     Therapeutic Exercise: PT instructed with tactile & verbal cues in posterior pelvic tilt. Pt performed 10 reps with tactile cues.  PT instructed pt to perform posterior pelvic tilt then bridge to facilitate core muscles. Pt performed 5 reps with verbal cues including breathing. PT instructed in posterior pelvic tilt prior to SLR. Pt performed 3 reps per LE with verbal cues.                         PT Education - 10/14/17 1109    Education provided  Yes    Education Details  Added posterior pelvic tilt to HEP, recommended breaking up exercises during day to accomodate fatigue & limit arthritic stiffness pain, bed mobility mechanics & positioning with pillows    Person(s) Educated  Patient    Methods  Explanation;Demonstration;Verbal cues;Handout    Comprehension  Verbalized understanding;Returned demonstration;Verbal cues required;Need further instruction       PT Short Term Goals - 09/20/17 1653      PT SHORT TERM GOAL #1   Title  The patient will be indep with HEP for LE stretching, strengthening, gait and low back.    Time  4    Period  Weeks    Target Date  10/20/17      PT SHORT TERM GOAL #2   Title  The patient will improve gait speed from 0.99 ft/sec to > or equal to 1.5 ft/sec to demo improving comfort with ambulation.    Time  4    Period  Weeks    Target Date  10/20/17      PT SHORT TERM GOAL #3   Title  The patient will ambulate for 5 minutes nonstop with pain remaining within 2/10 from initial baseline.    Baseline  Patient reports pain increase in standing.    Time  4    Period  Weeks    Target Date  10/20/17        PT Long Term Goals - 09/20/17 1655      PT LONG TERM GOAL #1   Title  The patient will be indep with progression of HEP.    Time  8    Period  Weeks    Target Date  11/19/17      PT LONG TERM GOAL #2   Title  The patient will improve gait speed from 0.99 ft/sec to > or equal to 1.8 ft/sec to demo dec'd fall risk.    Time  8    Period  Weeks    Target Date  11/19/17      PT LONG TERM GOAL #3   Title  The patient will negotiate 12 steps with one HR and reciprocal pattern to demo improving mobility.    Time  8    Period  Weeks    Target Date  11/19/17      PT LONG TERM GOAL #4   Title  The patient will return to household ambulation without device demonstrating improved mobility and return  to prior activity level.    Time  8    Period  Weeks  Target Date  11/19/17            Plan - 10/14/17 1114    Clinical Impression Statement  Patient seems to understand bed mobility using sidelying to get in/out bed and using pillows for positioning. Performing posterior pelvic tilt prior to bridging & SLR exercises facilitated core stability.     PT Treatment/Interventions  Dry needling;ADLs/Self Care Home Management;Balance training;Neuromuscular re-education;Patient/family education;Manual techniques;Cryotherapy;Gait training;Stair training;Functional mobility training;Therapeutic activities;Therapeutic exercise;Moist Heat    PT Next Visit Plan  check STGS, review HEP and add additional strengthening exercises as able. needs body mechanics review,  Ankle DF strengthening, LE stretching emphasizing piriformis and IT band, lumbar pelvic tilt with core stabilization, gait activities    Consulted and Agree with Plan of Care  Patient       Patient will benefit from skilled therapeutic intervention in order to improve the following deficits and impairments:  Abnormal gait, Decreased activity tolerance, Decreased strength, Increased fascial restricitons, Pain, Decreased balance, Decreased mobility, Impaired flexibility  Visit Diagnosis: Muscle weakness (generalized)  Other abnormalities of gait and mobility  Pain in right leg     Problem List Patient Active Problem List   Diagnosis Date Noted  . Carotid artery disease ([place]) 04/12/2017  . Passed out [facility]) 10/05/2015  . Lumbosacral radiculopathy 09/10/2014  . Cervical stenosis of spinal canal 09/10/2014  . Abnormality of gait 07/27/2014  . Paresthesia 07/27/2014  . Bilateral leg cramps 02/21/2014  . Coronary artery disease 01/13/2013  . Status post coronary artery bypass grafting: 2007 01/13/2013  . Peripheral vascular disease:  Moderate right and mild left ICA stenosis. 01/13/2013  . Essential hypertension 01/13/2013  .  Elevated cholesterol   . Menopause 02/12/2011  . Leiomyoma of body of uterus 02/12/2011  . Post-menopausal atrophic vaginitis 02/12/2011  . Urinary frequency 02/12/2011  . UNSPECIFIED PERIPHERAL VASCULAR DISEASE 07/11/2010    Jamey Reas PT, DPT 10/14/2017, 11:27 AM  [facility] [address] [place], [place], [address] Phone: [phone]   Fax:  [phone]  Name: Tara Potts MRN: [id number] Date of Birth: 11/20/1933

## 2017-10-14 NOTE — Telephone Encounter (Signed)
[id number] 

## 2017-10-14 NOTE — Patient Instructions (Addendum)
Pelvic Tilt: Posterior - Legs Bent (Supine)    Tighten stomach and flatten back by rolling pelvis down. Hold _3-5___ seconds. Relax. Repeat _10___ times.  http://orth.exer.us/203   Copyright  VHI. All rights reserved.

## 2017-10-15 NOTE — Telephone Encounter (Signed)
[email or web address] 

## 2017-10-16 ENCOUNTER — Ambulatory Visit: Payer: Medicare Other | Admitting: Rehabilitative and Restorative Service Providers"

## 2017-10-18 ENCOUNTER — Ambulatory Visit: Payer: Medicare Other | Admitting: Physical Therapy

## 2017-10-18 ENCOUNTER — Encounter: Payer: Self-pay | Admitting: Physical Therapy

## 2017-10-18 DIAGNOSIS — M79604 Pain in right leg: Secondary | ICD-10-CM

## 2017-10-18 DIAGNOSIS — R2689 Other abnormalities of gait and mobility: Secondary | ICD-10-CM | POA: Diagnosis not present

## 2017-10-18 DIAGNOSIS — M6281 Muscle weakness (generalized): Secondary | ICD-10-CM | POA: Diagnosis not present

## 2017-10-18 NOTE — Therapy (Signed)
[facility] [address] [place], [place], [address] Phone: [phone]   Fax:  [phone]  Physical Therapy Treatment  Patient Details  Name: Tara Potts MRN: [id number] Date of Birth: [birth year] Referring Provider: Cecille Rubin, NP   Encounter Date: 10/18/2017  PT End of Session - 10/18/17 1417    Visit Number  6    Number of Visits  12    Date for PT Re-Evaluation  11/19/17    Authorization Type  medicare    PT Start Time  5573    PT Stop Time  1455    PT Time Calculation (min)  50 min    Activity Tolerance  Patient tolerated treatment well;Patient limited by fatigue    Behavior During Therapy  [facility] for tasks assessed/performed       Past Medical History:  Diagnosis Date  . CAD (coronary artery disease)   . Carotid disease, bilateral (HCC)    mild  . Fibroid   . Hx of CABG 2007   [facility]  . Hyperlipemia   . Hypertension   . Menopausal symptoms   . Peripheral neuropathy   . Seizure-like activity [facility])     Past Surgical History:  Procedure Laterality Date  . CARDIAC CATHETERIZATION  01/09/2008   diffusely diseased graft to the PDA & diffuse PDA disease  . CAROTID DOPPLER  08/27/2011   mod. right & nild left ICA stenosis  . CATARACT SURGERY    . CORONARY ARTERY BYPASS GRAFT    . FOOT SURGERY    . MASS REMOVED FROM CERVIX  12/1997   Benign endocervical inclusion cysts  . NM MYOVIEW LTD  01/12/2011   no ischemia  . TRIPLE HEART BYPASS  05/2006   [facility]  . US ECHOCARDIOGRAPHY  10/30/2007   mild MA,MR,TR,AOV mildly sclerotic,density oted in the prox asc aorta    There were no vitals filed for this visit.  Subjective Assessment - 10/18/17 1408    Subjective  No changes. 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     Pertinent History  past  medical history of CAD (coronary artery disease); Carotid disease, bilateral ([place]); CABG (2007); Hyperlipemia; Hypertension;  Peripheral neuropathy; and Seizure-like activity ([place]).   Bilateral lower extremity paresthesias muscle cramping are multifactorial  including multilevel cervical degenerative disc disease, moderate central canal stenosis, along with significant lumbar degenerative disc disease with bilateral lumbar radiculopathy,  MRI 2016 Abnormal MRI scan of the lumbar spine showing prominent spondylitic changes and anterior listhesis most prominent at L4-5 where there is severe bilateral foraminal and moderate posterior canal narrowing. There are mild spondylitic changes at L3-4  resulting in severe posterior canal and mild right-sided foraminal narrowing as well.    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  Yes    Pain Score  3     Pain Location  Back    Pain Orientation  Right    Pain Descriptors / Indicators  Aching    Pain Type  Chronic pain    Pain Onset  More than a month ago    Pain Frequency  Constant  [facility] Adult PT Treatment/Exercise - 10/18/17 1416      Ambulation/Gait   Ambulation/Gait Assistance  6: Modified independent (Device/Increase time)    Ambulation Distance (Feet)  100 Feet 60, 70    Assistive device  Straight cane;None    Gait Pattern  Decreased stride length;Decreased weight shift to right;Trunk flexed    Ambulation Surface  Level;Indoor    Gait velocity  32.8/21.96=1.49 ft/sec (cane); 30.53 sec no device=1.07 ft/sec    Gait Comments  increased antalgic pattern and hip/trunk flexion with no device      Knee/Hip Exercises: Aerobic   Nustep  L4 x 10.5 minutes      Patient performed the below exercises from her HEP with varying assist (mod assist to independent):  Pelvic Tilt: Posterior - Legs Bent (Supine)    Tighten stomach and  flatten back by rolling pelvis down. Hold _3-5___ seconds. Relax. Repeat _10___ times.    Bridging    Slowly raise buttocks from floor, keeping stomach tight. Hold for 5 seconds. Repeat __10__ times per set. Do __1__ sets per session. Do _1-2___ sessions per day.  Heel Slide    Bend knee and pull heel toward buttocks. Hold __5__ seconds. Return. Repeat with other knee. *place rolled blanket or pillow under rt knee and go down to the point of mild pain and then bend hip/knee up again.  Repeat __10__ times. Do __1-2__ sessions per day.      Piriformis Stretch    Lying on back, pull right knee toward opposite shoulder. Hold __20-30__ seconds. Repeat __2-3__ times. Do __2-3__ sessions per day.         PT Education - 10/18/17 1505    Education Details  practice walking in her kitchen with hand on counter/island; when walking with cane time herself to see how long she can currently walk without stopping (she is to do both of these after 4/5 appt and report back at her next appt); ? arthritis in her hip contributing to groin/anterior hip pain; look into area pools where she could get in the water    Person(s) Educated  Patient    Methods  Explanation    Comprehension  Verbalized understanding       PT Short Term Goals - 10/18/17 1525      PT SHORT TERM GOAL #1   Title  The patient will be indep with HEP for LE stretching, strengthening, gait and low back.    Baseline  10/18/17 required assist cues with some exercises; others were independent    Time  4    Period  Weeks    Status  On-going      PT SHORT TERM GOAL #2   Title  The patient will improve gait speed from 0.99 ft/sec to > or equal to 1.5 ft/sec to demo improving comfort with ambulation.    Baseline  10/18/17 1.49 ft/sec with cane; 1.07 ft/sec no device    Time  4    Period  Weeks    Status  Achieved      PT SHORT TERM GOAL #3   Title  The patient will ambulate for 5 minutes nonstop with pain remaining  within 2/10 from initial baseline.    Baseline  Patient reports pain increase in standing. 10/18/17 Reports pain up to 4/10 and unable to ambulate for 5 minutes consecutively (did not have time to assess due to time spent on HEP)    Time  4    Period  Weeks  Status  On-going        PT Long Term Goals - 09/20/17 1655      PT LONG TERM GOAL #1   Title  The patient will be indep with progression of HEP.    Time  8    Period  Weeks    Target Date  11/19/17      PT LONG TERM GOAL #2   Title  The patient will improve gait speed from 0.99 ft/sec to > or equal to 1.8 ft/sec to demo dec'd fall risk.    Time  8    Period  Weeks    Target Date  11/19/17      PT LONG TERM GOAL #3   Title  The patient will negotiate 12 steps with one HR and reciprocal pattern to demo improving mobility.    Time  8    Period  Weeks    Target Date  11/19/17      PT LONG TERM GOAL #4   Title  The patient will return to household ambulation without device demonstrating improved mobility and return to prior activity level.    Time  8    Period  Weeks    Target Date  11/19/17            Plan - 10/18/17 1418    Clinical Impression Statement  Session focused on assessing progress towards STGs. Patient reports she thinks PT has helped "a little." Feels she is moving from sit to stand easier and walking more upright. After warm-up on non-weightbearing aerobic machine (recumbent stepper), had pt demonstrate her HEP. She was able to correctly perform pelvic tilt, bridge, piriformis stretch. She required mod vc for heelslides and reported she has not been doing this exercise due to pain in anterior hip/groin as well as pain radiating into buttock and posterior thigh. Modified to use bolster under her rt knee to limit RLE ROM (has incr pain with full hip/knee extension). Discussed ? if she has hip arthritis in addition to lumbar disease and she reported never had xray of her hip (none found in EMR). Educated in  benefits of appropriate exercises for arthrtis (and/or back pain) including pool exercises. Patient reports she has an outside pool at her home (not yet warm enough to use). Discussed multiple options for public pools she could access with pt not interested in swimming/exercising in a public pool. Will continue to address patient's HEP to improve independence and progress towards LTGs.     Rehab Potential  Good    Clinical Impairments Affecting Rehab Potential  patient motivated to participate.    PT Frequency  2x / week    PT Duration  4 weeks    PT Treatment/Interventions  Dry needling;ADLs/Self Care Home Management;Balance training;Neuromuscular re-education;Patient/family education;Manual techniques;Cryotherapy;Gait training;Stair training;Functional mobility training;Therapeutic activities;Therapeutic exercise;Moist Heat    PT Next Visit Plan **only has 2 more visits scheduled--LTGs go thru 11/19/17;  review remainder of HEP and add additional strengthening exercises as able. Check if she did her homework (outlined in Education section of note). needs body mechanics review,  Ankle DF strengthening, LE stretching emphasizing piriformis and IT band, lumbar pelvic tilt with core stabilization, gait activities    Consulted and Agree with Plan of Care  Patient       Patient will benefit from skilled therapeutic intervention in order to improve the following deficits and impairments:  Abnormal gait, Decreased activity tolerance, Decreased strength, Increased fascial restricitons, Pain, Decreased balance, Decreased mobility, Impaired  flexibility  Visit Diagnosis: Muscle weakness (generalized)  Other abnormalities of gait and mobility  Pain in right leg     Problem List Patient Active Problem List   Diagnosis Date Noted  . Carotid artery disease ([place]) 04/12/2017  . Passed out [facility] 10/05/2015  . Lumbosacral radiculopathy 09/10/2014  . Cervical stenosis of spinal canal 09/10/2014  .  Abnormality of gait 07/27/2014  . Paresthesia 07/27/2014  . Bilateral leg cramps 02/21/2014  . Coronary artery disease 01/13/2013  . Status post coronary artery bypass grafting: 2007 01/13/2013  . Peripheral vascular disease:  Moderate right and mild left ICA stenosis. 01/13/2013  . Essential hypertension 01/13/2013  . Elevated cholesterol   . Menopause 02/12/2011  . Leiomyoma of body of uterus 02/12/2011  . Post-menopausal atrophic vaginitis 02/12/2011  . Urinary frequency 02/12/2011  . UNSPECIFIED PERIPHERAL VASCULAR DISEASE 07/11/2010    Rexanne Mano, PT 10/18/2017, 8:18 PM  [facility] [address] [place], [place], [address] Phone: [phone]   Fax:  [phone]  Name: Tara Potts MRN: [id number] Date of Birth: Dec 05, 1933

## 2017-10-18 NOTE — Patient Instructions (Signed)
Pelvic Tilt: Posterior - Legs Bent (Supine)    Tighten stomach and flatten back by rolling pelvis down. Hold _3-5___ seconds. Relax. Repeat _10___ times.    Bridging    Slowly raise buttocks from floor, keeping stomach tight. Hold for 5 seconds. Repeat __10__ times per set. Do __1__ sets per session. Do _1-2___ sessions per day.  Heel Slide    Bend knee and pull heel toward buttocks. Hold __5__ seconds. Return. Repeat with other knee. *place rolled blanket or pillow under rt knee and go down to the point of mild pain and then bend hip/knee up again.  Repeat __10__ times. Do __1-2__ sessions per day.      Piriformis Stretch    Lying on back, pull right knee toward opposite shoulder. Hold __20-30__ seconds. Repeat __2-3__ times. Do __2-3__ sessions per day.

## 2017-10-21 ENCOUNTER — Encounter: Payer: Self-pay | Admitting: Rehabilitative and Restorative Service Providers"

## 2017-10-21 ENCOUNTER — Ambulatory Visit: Payer: Medicare Other | Admitting: Rehabilitative and Restorative Service Providers"

## 2017-10-21 DIAGNOSIS — M6281 Muscle weakness (generalized): Secondary | ICD-10-CM

## 2017-10-21 DIAGNOSIS — R2689 Other abnormalities of gait and mobility: Secondary | ICD-10-CM

## 2017-10-21 DIAGNOSIS — M79604 Pain in right leg: Secondary | ICD-10-CM | POA: Diagnosis not present

## 2017-10-21 NOTE — Therapy (Signed)
[facility] [address] [place], [place], [address] Phone: [phone]   Fax:  [phone]  Physical Therapy Treatment  Patient Details  Name: Tara Potts MRN: [id number] Date of Birth: [birth year] Referring Provider: Cecille Rubin, NP   Encounter Date: 10/21/2017  PT End of Session - 10/21/17 1003    Visit Number  7    Number of Visits  12    Date for PT Re-Evaluation  11/19/17    Authorization Type  medicare    PT Start Time  0937    PT Stop Time  1015    PT Time Calculation (min)  38 min    Activity Tolerance  Patient tolerated treatment well;Patient limited by pain    Behavior During Therapy  [facility] for tasks assessed/performed       Past Medical History:  Diagnosis Date  . CAD (coronary artery disease)   . Carotid disease, bilateral (HCC)    mild  . Fibroid   . Hx of CABG 2007   [facility]  . Hyperlipemia   . Hypertension   . Menopausal symptoms   . Peripheral neuropathy   . Seizure-like activity [facility])     Past Surgical History:  Procedure Laterality Date  . CARDIAC CATHETERIZATION  01/09/2008   diffusely diseased graft to the PDA & diffuse PDA disease  . CAROTID DOPPLER  08/27/2011   mod. right & nild left ICA stenosis  . CATARACT SURGERY    . CORONARY ARTERY BYPASS GRAFT    . FOOT SURGERY    . MASS REMOVED FROM CERVIX  12/1997   Benign endocervical inclusion cysts  . NM MYOVIEW LTD  01/12/2011   no ischemia  . TRIPLE HEART BYPASS  05/2006   [facility]  . US ECHOCARDIOGRAPHY  10/30/2007   mild MA,MR,TR,AOV mildly sclerotic,density oted in the prox asc aorta    There were no vitals filed for this visit.  Subjective Assessment - 10/21/17 0938    Subjective  The patient reports she got up and did her own exercise routine "I felt like my whole body was getting stiff".  She notes she has continued pain in her R anterior/lateral hip and in her R SI region.    "I feel like what you are  doing i shelping, but I really want to  know what is going on."      Pertinent History  past medical history of CAD (coronary artery disease); Carotid disease, bilateral ([place]); CABG (2007); Hyperlipemia; Hypertension;  Peripheral neuropathy; and Seizure-like activity ([place]).   Bilateral lower extremity paresthesias muscle cramping are multifactorial  including multilevel cervical degenerative disc disease, moderate central canal stenosis, along with significant lumbar degenerative disc disease with bilateral lumbar radiculopathy,  MRI 2016 Abnormal MRI scan of the lumbar spine showing prominent spondylitic changes and anterior listhesis most prominent at L4-5 where there is severe bilateral foraminal and moderate posterior canal narrowing. There are mild spondylitic changes at L3-4  resulting in severe posterior canal and mild right-sided foraminal narrowing as well.    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  Yes    Pain Score  4  "just enough to get on my nerves"; "it's not severe", "I just know it is there"    Pain Location  Hip    Pain Orientation  Right    Pain Descriptors / Indicators  Sore also feels  weak at times    Pain Type  Chronic pain    Pain Onset  More than a month ago    Pain Frequency  Constant    Aggravating Factors   worse with standing    Pain Relieving Factors  sitting, resting         [facility] PT Assessment - 10/21/17 0954      Strength   Overall Strength Comments  R hip flexion 3+/5 with pain, R hip abduction 3/5 with pain when resistance applied, R hip extension       Special Tests    Special Tests  Hip Special Tests    Hip Special Tests   [facility] [place] [facility] Test    Findings  Positive    Side  Right    Comments  Pain in anterior hip perceived as "deep joint pain" when doing thomas test- R leg maintains flexed position also indicating muscle shortening/tightness.                    [facility] Adult PT Treatment/Exercise - 10/21/17 0953      Ambulation/Gait   Ambulation/Gait  Yes    Ambulation/Gait Assistance  6: Modified independent (Device/Increase time) 200 ft with SPC    Ambulation Distance (Feet)  20 Feet x 4 reps up and down countertop without cane    Assistive device  Straight cane;None    Ambulation Surface  Level;Indoor    Gait Comments  R antalgic pattern with gait.  After stretching, postural upright/hip position improved, however after 200 ft, note increasing pain with gait.       Exercises   Exercises  Other Exercises    Other Exercises   STANDING:  hip abduction x 5 reps- stopped due to anterior and lateral hip pain R side, Standing hip extension x 3 reps stopped due to pain, R hip flexion x 3 reps with pain.  SIDELYING:  hip abduction with pain x 3 reps, IT band and hip flexor stretching. PRONE:  gluteal set x 10 reps, attempted hip extension, however patient notes pain with movement.         Manual Therapy   Manual Therapy  Muscle Energy Technique    Muscle Energy Technique  contract/relax in thomas test R hip stretch position with pain reported.  However, after this stretch, patient able to maintain greater upright position.             PT Education - 10/21/17 1033    Education provided  Yes    Education Details  continue current HEP; recommended f/u with MD regarding hip pain    Person(s) Educated  Patient    Methods  Explanation    Comprehension  Verbalized understanding       PT Short Term Goals - 10/18/17 1525      PT SHORT TERM GOAL #1   Title  The patient will be indep with HEP for LE stretching, strengthening, gait and low back.    Baseline  10/18/17 required assist cues with some exercises; others were independent    Time  4    Period  Weeks    Status  On-going      PT SHORT TERM GOAL #2   Title  The patient will improve gait speed from 0.99 ft/sec to > or equal to 1.5 ft/sec to demo improving comfort  with ambulation.    Baseline  10/18/17 1.49 ft/sec with cane; 1.07 ft/sec no device  Time  4    Period  Weeks    Status  Achieved      PT SHORT TERM GOAL #3   Title  The patient will ambulate for 5 minutes nonstop with pain remaining within 2/10 from initial baseline.    Baseline  Patient reports pain increase in standing. 10/18/17 Reports pain up to 4/10 and unable to ambulate for 5 minutes consecutively (did not have time to assess due to time spent on HEP)    Time  4    Period  Weeks    Status  On-going        PT Long Term Goals - 09/20/17 1655      PT LONG TERM GOAL #1   Title  The patient will be indep with progression of HEP.    Time  8    Period  Weeks    Target Date  11/19/17      PT LONG TERM GOAL #2   Title  The patient will improve gait speed from 0.99 ft/sec to > or equal to 1.8 ft/sec to demo dec'd fall risk.    Time  8    Period  Weeks    Target Date  11/19/17      PT LONG TERM GOAL #3   Title  The patient will negotiate 12 steps with one HR and reciprocal pattern to demo improving mobility.    Time  8    Period  Weeks    Target Date  11/19/17      PT LONG TERM GOAL #4   Title  The patient will return to household ambulation without device demonstrating improved mobility and return to prior activity level.    Time  8    Period  Weeks    Target Date  11/19/17            Plan - 10/21/17 1004    Clinical Impression Statement  The patient continues to be most limited by focal R anterior/lateral hip pain.  Patient's complaints may be more related to hip versus low back discomfort, therefore, may benefit from further R hip workup. PT to contact primary care MD regarding R hip pain and recommended patient also f/u.    Rehab Potential  Good    PT Treatment/Interventions  Dry needling;ADLs/Self Care Home Management;Balance training;Neuromuscular re-education;Patient/family education;Manual techniques;Cryotherapy;Gait training;Stair training;Functional mobility  training;Therapeutic activities;Therapeutic exercise;Moist Heat    PT Next Visit Plan  FINISH CHECKING STGS; Patient to f/u with Dr. [name] office regarding R hip apin.  *PT to send note.    Focus on R hip anterior stretching, hip strengthening within tolerable range.    Consulted and Agree with Plan of Care  Patient       Patient will benefit from skilled therapeutic intervention in order to improve the following deficits and impairments:  Abnormal gait, Decreased activity tolerance, Decreased strength, Increased fascial restricitons, Pain, Decreased balance, Decreased mobility, Impaired flexibility  Visit Diagnosis: Muscle weakness (generalized)  Other abnormalities of gait and mobility  Pain in right leg     Problem List Patient Active Problem List   Diagnosis Date Noted  . Carotid artery disease ([place]) 04/12/2017  . Passed out [facility]) 10/05/2015  . Lumbosacral radiculopathy 09/10/2014  . Cervical stenosis of spinal canal 09/10/2014  . Abnormality of gait 07/27/2014  . Paresthesia 07/27/2014  . Bilateral leg cramps 02/21/2014  . Coronary artery disease 01/13/2013  . Status post coronary artery bypass grafting: 2007 01/13/2013  . Peripheral vascular disease:  Moderate right and mild left ICA stenosis. 01/13/2013  . Essential hypertension 01/13/2013  . Elevated cholesterol   . Menopause 02/12/2011  . Leiomyoma of body of uterus 02/12/2011  . Post-menopausal atrophic vaginitis 02/12/2011  . Urinary frequency 02/12/2011  . UNSPECIFIED PERIPHERAL VASCULAR DISEASE 07/11/2010    Braeden Kennan, PT 10/21/2017, 10:36 AM  [facility] [address] [place], [place], [address] Phone: [phone]   Fax:  [phone]  Name: Tara Potts MRN: [id number] Date of Birth: 06/05/1934

## 2017-10-22 DIAGNOSIS — I10 Essential (primary) hypertension: Secondary | ICD-10-CM | POA: Diagnosis not present

## 2017-10-22 DIAGNOSIS — M25551 Pain in right hip: Secondary | ICD-10-CM | POA: Diagnosis not present

## 2017-10-22 DIAGNOSIS — I2510 Atherosclerotic heart disease of native coronary artery without angina pectoris: Secondary | ICD-10-CM | POA: Diagnosis not present

## 2017-10-23 ENCOUNTER — Ambulatory Visit: Payer: Medicare Other | Admitting: Rehabilitative and Restorative Service Providers"

## 2017-10-30 DIAGNOSIS — M549 Dorsalgia, unspecified: Secondary | ICD-10-CM | POA: Diagnosis not present

## 2017-10-30 DIAGNOSIS — M1180 Other specified crystal arthropathies, unspecified site: Secondary | ICD-10-CM | POA: Diagnosis not present

## 2017-10-30 DIAGNOSIS — M2550 Pain in unspecified joint: Secondary | ICD-10-CM | POA: Diagnosis not present

## 2017-10-30 DIAGNOSIS — M25559 Pain in unspecified hip: Secondary | ICD-10-CM | POA: Diagnosis not present

## 2017-10-30 DIAGNOSIS — I7300 Raynaud's syndrome without gangrene: Secondary | ICD-10-CM | POA: Diagnosis not present

## 2017-10-30 DIAGNOSIS — M1990 Unspecified osteoarthritis, unspecified site: Secondary | ICD-10-CM | POA: Diagnosis not present

## 2017-10-30 DIAGNOSIS — N189 Chronic kidney disease, unspecified: Secondary | ICD-10-CM | POA: Diagnosis not present

## 2017-11-08 ENCOUNTER — Encounter

## 2017-11-08 ENCOUNTER — Ambulatory Visit: Payer: Medicare Other | Admitting: Rehabilitative and Restorative Service Providers"

## 2017-11-11 ENCOUNTER — Encounter: Payer: Self-pay | Admitting: Rehabilitative and Restorative Service Providers"

## 2017-11-11 ENCOUNTER — Ambulatory Visit: Payer: Medicare Other | Admitting: Rehabilitative and Restorative Service Providers"

## 2017-11-11 DIAGNOSIS — R2689 Other abnormalities of gait and mobility: Secondary | ICD-10-CM | POA: Diagnosis not present

## 2017-11-11 DIAGNOSIS — M79604 Pain in right leg: Secondary | ICD-10-CM | POA: Diagnosis not present

## 2017-11-11 DIAGNOSIS — M6281 Muscle weakness (generalized): Secondary | ICD-10-CM | POA: Diagnosis not present

## 2017-11-11 NOTE — Therapy (Signed)
[facility] [address] [place], [place], [address] Phone: [phone]   Fax:  [phone]  Physical Therapy Treatment  Patient Details  Name: Tara Potts MRN: [id number] Date of Birth: 12-Aug-1933 Referring Provider: Cecille Rubin, NP   Encounter Date: 11/11/2017  PT End of Session - 11/11/17 1355    Visit Number  8    Number of Visits  12    Date for PT Re-Evaluation  11/19/17    Authorization Type  medicare    PT Start Time  1240    PT Stop Time  1320    PT Time Calculation (min)  40 min    Activity Tolerance  Patient tolerated treatment well;Patient limited by pain    Behavior During Therapy  [facility] for tasks assessed/performed       Past Medical History:  Diagnosis Date  . CAD (coronary artery disease)   . Carotid disease, bilateral (HCC)    mild  . Fibroid   . Hx of CABG 2007   [facility]  . Hyperlipemia   . Hypertension   . Menopausal symptoms   . Peripheral neuropathy   . Seizure-like activity [facility])     Past Surgical History:  Procedure Laterality Date  . CARDIAC CATHETERIZATION  01/09/2008   diffusely diseased graft to the PDA & diffuse PDA disease  . CAROTID DOPPLER  08/27/2011   mod. right & nild left ICA stenosis  . CATARACT SURGERY    . CORONARY ARTERY BYPASS GRAFT    . FOOT SURGERY    . MASS REMOVED FROM CERVIX  12/1997   Benign endocervical inclusion cysts  . NM MYOVIEW LTD  01/12/2011   no ischemia  . TRIPLE HEART BYPASS  05/2006   [facility]  . US ECHOCARDIOGRAPHY  10/30/2007   mild MA,MR,TR,AOV mildly sclerotic,density oted in the prox asc aorta    There were no vitals filed for this visit.  Subjective Assessment - 11/11/17 1241    Subjective  The patient reports that she saw Dr. Shelia Media for R hip pain and notes that she may need to have surgery because of bone on bone arthritis.  She is being referred to orthopedic MD for further assessment.    She notes "I was afraid  to do my exercises."    Pertinent History  past medical history of CAD (coronary artery disease); Carotid disease, bilateral ([place]); CABG (2007); Hyperlipemia; Hypertension;  Peripheral neuropathy; and Seizure-like activity ([place]).   Bilateral lower extremity paresthesias muscle cramping are multifactorial  including multilevel cervical degenerative disc disease, moderate central canal stenosis, along with significant lumbar degenerative disc disease with bilateral lumbar radiculopathy,  MRI 2016 Abnormal MRI scan of the lumbar spine showing prominent spondylitic changes and anterior listhesis most prominent at L4-5 where there is severe bilateral foraminal and moderate posterior canal narrowing. There are mild spondylitic changes at L3-4  resulting in severe posterior canal and mild right-sided foraminal narrowing as well.    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  Yes    Pain Score  8     Pain Location  Hip    Pain Orientation  Right    Pain Descriptors / Indicators  Sore    Pain Type  Chronic pain    Pain Radiating Towards  Posterior aspect of R hip and goes into inner thigh    Pain Onset  More  than a month ago    Pain Frequency  Constant    Aggravating Factors   worse with standing     Pain Relieving Factors  sitting, resting                       [facility] Adult PT Treatment/Exercise - 11/11/17 1356      Ambulation/Gait   Ambulation/Gait  Yes    Ambulation/Gait Assistance  6: Modified independent (Device/Increase time)    Ambulation Distance (Feet)  150 Feet    Assistive device  Straight cane    Ambulation Surface  Level;Indoor    Gait Comments  Slowed gait speed due to antalgic pain.      Exercises   Exercises  Knee/Hip      Knee/Hip Exercises: Supine   Heel Slides  Right;10 reps    Heel Slides Limitations  Able to tolerate well today    Bridges  Both;10 reps    Bridges  Limitations  Hamstring cramp left side.    Straight Leg Raises  Right    Straight Leg Raises Limitations  Unable to perform due to pain    Other Supine Knee/Hip Exercises  Supine marching  x 10 reps      Knee/Hip Exercises: Sidelying   Hip ABduction  10 reps             PT Education - 11/11/17 1354    Education provided  Yes    Education Details  HEP:  hip abduction, heel slide, bridges, supine marching    Person(s) Educated  Patient    Methods  Explanation;Demonstration;Handout    Comprehension  Verbalized understanding;Returned demonstration       PT Short Term Goals - 10/18/17 1525      PT SHORT TERM GOAL #1   Title  The patient will be indep with HEP for LE stretching, strengthening, gait and low back.    Baseline  10/18/17 required assist cues with some exercises; others were independent    Time  4    Period  Weeks    Status  On-going      PT SHORT TERM GOAL #2   Title  The patient will improve gait speed from 0.99 ft/sec to > or equal to 1.5 ft/sec to demo improving comfort with ambulation.    Baseline  10/18/17 1.49 ft/sec with cane; 1.07 ft/sec no device    Time  4    Period  Weeks    Status  Achieved      PT SHORT TERM GOAL #3   Title  The patient will ambulate for 5 minutes nonstop with pain remaining within 2/10 from initial baseline.    Baseline  Patient reports pain increase in standing. 10/18/17 Reports pain up to 4/10 and unable to ambulate for 5 minutes consecutively (did not have time to assess due to time spent on HEP)    Time  4    Period  Weeks    Status  On-going        PT Long Term Goals - 09/20/17 1655      PT LONG TERM GOAL #1   Title  The patient will be indep with progression of HEP.    Time  8    Period  Weeks    Target Date  11/19/17      PT LONG TERM GOAL #2   Title  The patient will improve gait speed from 0.99 ft/sec to > or equal to 1.8 ft/sec  to demo dec'd fall risk.    Time  8    Period  Weeks    Target Date  11/19/17       PT LONG TERM GOAL #3   Title  The patient will negotiate 12 steps with one HR and reciprocal pattern to demo improving mobility.    Time  8    Period  Weeks    Target Date  11/19/17      PT LONG TERM GOAL #4   Title  The patient will return to household ambulation without device demonstrating improved mobility and return to prior activity level.    Time  8    Period  Weeks    Target Date  11/19/17            Plan - 11/11/17 1407    Clinical Impression Statement  PT and patient reviewed exercises to have some single plane exercises to continue LE strengthening and maintaining mobility.  Recommended patient hold off on other HEP previously established until after further orthopedic workup.  PT recommended holding current therapy until further orthopedic evaluation occurs due to significant right hip pain limiting further progress.     PT Treatment/Interventions  Dry needling;ADLs/Self Care Home Management;Balance training;Neuromuscular re-education;Patient/family education;Manual techniques;Cryotherapy;Gait training;Stair training;Functional mobility training;Therapeutic activities;Therapeutic exercise;Moist Heat    PT Next Visit Plan  Recommend hold until after orthopedic assessment.    Consulted and Agree with Plan of Care  Patient       Patient will benefit from skilled therapeutic intervention in order to improve the following deficits and impairments:  Abnormal gait, Decreased activity tolerance, Decreased strength, Increased fascial restricitons, Pain, Decreased balance, Decreased mobility, Impaired flexibility  Visit Diagnosis: Muscle weakness (generalized)  Other abnormalities of gait and mobility  Pain in right leg     Problem List Patient Active Problem List   Diagnosis Date Noted  . Carotid artery disease ([place]) 04/12/2017  . Passed out [facility]) 10/05/2015  . Lumbosacral radiculopathy 09/10/2014  . Cervical stenosis of spinal canal 09/10/2014  . Abnormality of gait  07/27/2014  . Paresthesia 07/27/2014  . Bilateral leg cramps 02/21/2014  . Coronary artery disease 01/13/2013  . Status post coronary artery bypass grafting: 2007 01/13/2013  . Peripheral vascular disease:  Moderate right and mild left ICA stenosis. 01/13/2013  . Essential hypertension 01/13/2013  . Elevated cholesterol   . Menopause 02/12/2011  . Leiomyoma of body of uterus 02/12/2011  . Post-menopausal atrophic vaginitis 02/12/2011  . Urinary frequency 02/12/2011  . UNSPECIFIED PERIPHERAL VASCULAR DISEASE 07/11/2010    Kynzli Rease, PT 11/11/2017, 2:10 PM  [facility] [address] [place], [place], [address] Phone: [phone]   Fax:  [phone]  Name: KAYTELYN GLORE MRN: [id number] Date of Birth: [birth year]

## 2017-11-11 NOTE — Patient Instructions (Addendum)
Access Code: [facility]  URL: [email or web address]  Date: 11/11/2017  Prepared by: Rudell Cobb   Program Notes  Perform these exercises daily until your appointment with orthopedic doctor. These exercises are focused on maintaining range of motion and strength around hip.   Exercises  Supine Heel Slide - 10 reps - 2 sets - 1x daily - 7x weekly  Supine Bridge - 10 reps - 2 sets - 1x daily - 7x weekly  Sidelying Hip Abduction - 10 reps - 1 sets - 1x daily - 7x weekly  Supine March - 10 reps - 2 sets - 1x daily - 7x weekly

## 2017-11-12 DIAGNOSIS — E7800 Pure hypercholesterolemia, unspecified: Secondary | ICD-10-CM | POA: Diagnosis not present

## 2017-11-13 ENCOUNTER — Ambulatory Visit: Payer: Medicare Other | Admitting: Rehabilitative and Restorative Service Providers"

## 2017-11-14 DIAGNOSIS — E7800 Pure hypercholesterolemia, unspecified: Secondary | ICD-10-CM | POA: Diagnosis not present

## 2017-11-14 DIAGNOSIS — I2510 Atherosclerotic heart disease of native coronary artery without angina pectoris: Secondary | ICD-10-CM | POA: Diagnosis not present

## 2017-11-14 DIAGNOSIS — I10 Essential (primary) hypertension: Secondary | ICD-10-CM | POA: Diagnosis not present

## 2017-11-14 DIAGNOSIS — E789 Disorder of lipoprotein metabolism, unspecified: Secondary | ICD-10-CM | POA: Diagnosis not present

## 2017-11-18 ENCOUNTER — Ambulatory Visit: Payer: Medicare Other | Admitting: Rehabilitative and Restorative Service Providers"

## 2017-11-20 ENCOUNTER — Ambulatory Visit: Payer: Medicare Other | Admitting: Rehabilitative and Restorative Service Providers"

## 2017-11-28 DIAGNOSIS — I10 Essential (primary) hypertension: Secondary | ICD-10-CM | POA: Diagnosis not present

## 2017-11-28 DIAGNOSIS — E7800 Pure hypercholesterolemia, unspecified: Secondary | ICD-10-CM | POA: Diagnosis not present

## 2017-12-04 DIAGNOSIS — M1611 Unilateral primary osteoarthritis, right hip: Secondary | ICD-10-CM | POA: Diagnosis not present

## 2017-12-04 DIAGNOSIS — M25551 Pain in right hip: Secondary | ICD-10-CM | POA: Diagnosis not present

## 2017-12-23 DIAGNOSIS — E7800 Pure hypercholesterolemia, unspecified: Secondary | ICD-10-CM | POA: Diagnosis not present

## 2017-12-26 DIAGNOSIS — I10 Essential (primary) hypertension: Secondary | ICD-10-CM | POA: Diagnosis not present

## 2017-12-26 DIAGNOSIS — E782 Mixed hyperlipidemia: Secondary | ICD-10-CM | POA: Diagnosis not present

## 2018-01-03 ENCOUNTER — Encounter: Payer: Self-pay | Admitting: Rehabilitative and Restorative Service Providers"

## 2018-01-03 NOTE — Therapy (Signed)
[facility] [address] [place], [place], [address] Phone: [phone]   Fax:  [phone]  Patient Details  Name: Tara Potts MRN: [id number] Date of Birth: May 31, 1934 Referring Provider:  No ref. provider found  Encounter Date: 11/11/2017  PHYSICAL THERAPY DISCHARGE SUMMARY  Visits from Start of Care: 8  Current functional level related to goals / functional outcomes: PT Short Term Goals - 10/18/17 1525      PT SHORT TERM GOAL #1   Title  The patient will be indep with HEP for LE stretching, strengthening, gait and low back.    Baseline  10/18/17 required assist cues with some exercises; others were independent    Time  4    Period  Weeks    Status  On-going      PT SHORT TERM GOAL #2   Title  The patient will improve gait speed from 0.99 ft/sec to > or equal to 1.5 ft/sec to demo improving comfort with ambulation.    Baseline  10/18/17 1.49 ft/sec with cane; 1.07 ft/sec no device    Time  4    Period  Weeks    Status  Achieved      PT SHORT TERM GOAL #3   Title  The patient will ambulate for 5 minutes nonstop with pain remaining within 2/10 from initial baseline.    Baseline  Patient reports pain increase in standing. 10/18/17 Reports pain up to 4/10 and unable to ambulate for 5 minutes consecutively (did not have time to assess due to time spent on HEP)    Time  4    Period  Weeks    Status  On-going      PT Long Term Goals - 09/20/17 1655      PT LONG TERM GOAL #1   Title  The patient will be indep with progression of HEP.    Time  8    Period  Weeks    Target Date  11/19/17      PT LONG TERM GOAL #2   Title  The patient will improve gait speed from 0.99 ft/sec to > or equal to 1.8 ft/sec to demo dec'd fall risk.    Time  8    Period  Weeks    Target Date  11/19/17      PT LONG TERM GOAL #3   Title  The patient will negotiate 12 steps with one HR and reciprocal pattern to demo improving mobility.    Time  8    Period  Weeks    Target Date  11/19/17      PT LONG TERM GOAL #4   Title  The patient will return to household ambulation without device demonstrating improved mobility and return to prior activity level.    Time  8    Period  Weeks    Target Date  11/19/17      PT HELD AFTER LAST VISIT: due to further assessment for worsening hip pain.  Patient has not returned.      Plan: Patient agrees to discharge.  Patient goals were partially met. Patient is being discharged due to a change in medical status.  ?????     Thank you for the referral of this patient. Rudell Cobb, MPT    Tara Potts 01/03/2018, 9:29 AM  [facility] [address] [place], [place], [address] Phone: [phone]   Fax:  [phone]

## 2018-01-08 ENCOUNTER — Telehealth: Payer: Self-pay

## 2018-01-08 NOTE — Telephone Encounter (Signed)
   Primary Cardiologist:Jonathan Gwenlyn Found, MD  Chart reviewed as part of pre-operative protocol coverage. Because of Tara Potts's past medical history and time since last visit, he/she will require a follow-up visit in order to better assess preoperative cardiovascular risk. Given age, prior CABG 12 years ago, carotid disease, HTN, HLD, CKD, and over 6 months since last visit, it is most prudent to see her back in clinic before clearing for a total hip replacement.  Pre-op covering staff: - Please schedule appointment and call patient to inform them. - Please contact requesting surgeon's office via preferred method (i.e, phone, fax) to inform them of need for appointment prior to surgery.  Charlie Pitter, PA-C  01/08/2018, 5:14 PM

## 2018-01-08 NOTE — Telephone Encounter (Signed)
   [facility] Pre-operative Risk Assessment    Request for surgical clearance:  1. What type of surgery is being performed? RTHA   2. When is this surgery scheduled? 01/28/18   3. What type of clearance is required (medical clearance vs. Pharmacy clearance to hold med vs. Both)? Pharmacy  4. Are there any medications that need to be held prior to surgery and how long?aspirin   5. Practice name and name of physician performing surgery? [facility] Dr Alvan Dame   6. What is your office phone [phone]    7.   What is your office fax [phone]  8.   Anesthesia type (None, local, MAC, general) ? spinal   Frederik Schmidt 01/08/2018, 12:03 PM  _________________________________________________________________   (provider comments below)

## 2018-01-09 NOTE — Telephone Encounter (Signed)
Spoke with pt re: cardiac clearance.  She has been made aware that she will need to be seen in office, to better reassess the cardiovascular risk of this procedure. Pt has been scheduled to see Jory Sims, NP, 01/15/18, arriving at 10:45.  Pt thanked me for the call and verbalized understanding.   Will fax over to [facility] to make them aware.

## 2018-01-13 NOTE — H&P (Signed)
TOTAL HIP ADMISSION H&P  Patient is admitted for right total hip arthroplasty, anterior approach.  Subjective:  Chief Complaint:    Right hip primary OA / pain  HPI: Tara Potts, 84 y.o. female, has a history of pain and functional disability in the right hip(s) due to arthritis and patient has failed non-surgical conservative treatments for greater than 12 weeks to include NSAID's and/or analgesics, corticosteriod injections, use of assistive devices and activity modification.  Onset of symptoms was gradual starting <1 years ago with rapidlly worsening course since that time.The patient noted no past surgery on the right hip(s).  Patient currently rates pain in the right hip at 8 out of 10 with activity. Patient has worsening of pain with activity and weight bearing, trendelenberg gait, pain that interfers with activities of daily living and pain with passive range of motion. Patient has evidence of periarticular osteophytes and joint space narrowing by imaging studies. This condition presents safety issues increasing the risk of falls.  There is no current active infection.   Risks, benefits and expectations were discussed with the patient.  Risks including but not limited to the risk of anesthesia, blood clots, nerve damage, blood vessel damage, failure of the prosthesis, infection and up to and including death.  Patient understand the risks, benefits and expectations and wishes to proceed with surgery.   PCP: [name], MD  D/C Plans:       Home   Post-op Meds:       No Rx given   Tranexamic Acid:      To be given - IV   Decadron:      Is to be given  FYI:      ASA  Tramadol & APAP  DME:   Pt equipment arranged  PT:   No PT   Patient Active Problem List   Diagnosis Date Noted  . Carotid artery disease ([place]) 04/12/2017  . Passed out [facility]) 10/05/2015  . Lumbosacral radiculopathy 09/10/2014  . Cervical stenosis of spinal canal 09/10/2014  . Abnormality of gait 07/27/2014  .  Paresthesia 07/27/2014  . Bilateral leg cramps 02/21/2014  . Coronary artery disease 01/13/2013  . Status post coronary artery bypass grafting: 2007 01/13/2013  . Peripheral vascular disease:  Moderate right and mild left ICA stenosis. 01/13/2013  . Essential hypertension 01/13/2013  . Elevated cholesterol   . Menopause 02/12/2011  . Leiomyoma of body of uterus 02/12/2011  . Post-menopausal atrophic vaginitis 02/12/2011  . Urinary frequency 02/12/2011  . UNSPECIFIED PERIPHERAL VASCULAR DISEASE 07/11/2010   Past Medical History:  Diagnosis Date  . CAD (coronary artery disease)   . Carotid disease, bilateral (HCC)    mild  . Fibroid   . Hx of CABG 2007   [facility]  . Hyperlipemia   . Hypertension   . Menopausal symptoms   . Peripheral neuropathy   . Seizure-like activity [facility])     Past Surgical History:  Procedure Laterality Date  . CARDIAC CATHETERIZATION  01/09/2008   diffusely diseased graft to the PDA & diffuse PDA disease  . CAROTID DOPPLER  08/27/2011   mod. right & nild left ICA stenosis  . CATARACT SURGERY    . CORONARY ARTERY BYPASS GRAFT    . FOOT SURGERY    . MASS REMOVED FROM CERVIX  12/1997   Benign endocervical inclusion cysts  . NM MYOVIEW LTD  01/12/2011   no ischemia  . TRIPLE HEART BYPASS  05/2006   [facility]  . US  ECHOCARDIOGRAPHY  10/30/2007   mild MA,MR,TR,AOV mildly sclerotic,density oted in the prox asc aorta    No current facility-administered medications for this encounter.    Current Outpatient Medications  Medication Sig Dispense Refill Last Dose  . Ascorbic Acid (VITAMIN C PO) Take 1 tablet by mouth daily.    Taking  . aspirin 325 MG tablet Take 325 mg by mouth daily.     Taking  . Calcium-Magnesium-Vitamin D (CALCIUM MAGNESIUM PO) Take 1 tablet by mouth 2 (two) times daily.    Taking  . co-enzyme Q-10 30 MG capsule Take 30 mg by mouth daily.    Taking  . DULoxetine (CYMBALTA) 30 MG capsule Take 1 capsule (30 mg total) by mouth  daily. 30 capsule 6 Taking  . estradiol-norethindrone (COMBIPATCH) 0.05-0.14 MG/DAY PLACE 1 PATCH ONTO THE SKIN MONTHLY 8 patch 2   . fish oil-omega-3 fatty acids 1000 MG capsule Take 2 g by mouth daily.     Taking  . furosemide (LASIX) 40 MG tablet 40 mg daily.  6 Taking  . GARLIC PO Take 1 tablet by mouth 2 (two) times daily.    Taking  . glucosamine-chondroitin 500-400 MG tablet Take 2 tablets by mouth 2 (two) times daily.   Taking  . hydrochlorothiazide (MICROZIDE) 12.5 MG capsule Take 1 capsule (12.5 mg total) by mouth daily. START after losartan/HCTZ completed 30 capsule 1   . losartan (COZAAR) 50 MG tablet Take 1 tablet (50 mg total) by mouth daily. START after losartan/HCTZ completed 30 tablet 1   . Magnesium 200 MG TABS Take 200 mg by mouth at bedtime.   Taking  . metoprolol tartrate (LOPRESSOR) 50 MG tablet Take 1 tablet (50 mg total) by mouth 2 (two) times daily. 180 tablet 3 Taking  . Multiple Vitamin (MULTIVITAMIN WITH MINERALS) TABS tablet Take 1 tablet by mouth daily.   Taking  . potassium chloride (K-DUR) 10 MEQ tablet TAKE 1 TABLET(10 MEQ) BY MOUTH DAILY 90 tablet 3 Taking  . rosuvastatin (CRESTOR) 20 MG tablet Take 20 mg by mouth daily.  3 Taking   Allergies  Allergen Reactions  . Cymbalta [Duloxetine Hcl] Nausea Only    Social History   Tobacco Use  . Smoking status: Former Research scientist (life sciences)  . Smokeless tobacco: Never Used  . Tobacco comment: Quit 30 years ago  Substance Use Topics  . Alcohol use: No    Alcohol/week: 0.0 oz    Family History  Problem Relation Age of Onset  . Diabetes Mother   . Hypertension Mother   . Heart disease Mother   . Heart failure Mother   . Hypertension Sister   . Breast cancer Maternal Aunt        Age 78  . Heart failure Maternal Aunt   . Heart failure Maternal Uncle      Review of Systems  Constitutional: Negative.   HENT: Negative.   Eyes: Negative.   Respiratory: Negative.   Cardiovascular: Negative.   Gastrointestinal: Negative.    Genitourinary: Negative.   Musculoskeletal: Positive for back pain and joint pain.  Skin: Negative.   Neurological: Negative.   Endo/Heme/Allergies: Negative.   Psychiatric/Behavioral: Negative.     Objective:  Physical Exam  Constitutional: She is oriented to person, place, and time. She appears well-developed.  HENT:  Head: Normocephalic.  Eyes: Pupils are equal, round, and reactive to light.  Neck: Neck supple. No JVD present. No tracheal deviation present. No thyromegaly present.  Cardiovascular: Normal rate, regular rhythm and intact distal pulses.  Respiratory: Effort normal and breath sounds normal. No respiratory distress. She has no wheezes.  GI: Soft. There is no tenderness. There is no guarding.  Musculoskeletal:       Right hip: She exhibits decreased range of motion, decreased strength, tenderness and bony tenderness. She exhibits no swelling, no deformity and no laceration.  Lymphadenopathy:    She has no cervical adenopathy.  Neurological: She is alert and oriented to person, place, and time.  Skin: Skin is warm and dry.  Psychiatric: She has a normal mood and affect.      Labs:  Estimated body mass index is 31.96 kg/m as calculated from the following:   Height as of 09/26/17: 5\' 6"  (1.676 m).   Weight as of 09/26/17: 89.8 kg (198 lb).   Imaging Review Plain radiographs demonstrate severe degenerative joint disease of the right hip(s). The bone quality appears to be good for age and reported activity level.    Preoperative templating of the joint replacement has been completed, documented, and submitted to the Operating Room personnel in order to optimize intra-operative equipment management.     Assessment/Plan:  End stage arthritis, right hip  The patient history, physical examination, clinical judgement of the provider and imaging studies are consistent with end stage degenerative joint disease of the right hip and total hip arthroplasty is deemed  medically necessary. The treatment options including medical management, injection therapy, arthroscopy and arthroplasty were discussed at length. The risks and benefits of total hip arthroplasty were presented and reviewed. The risks due to aseptic loosening, infection, stiffness, dislocation/subluxation,  thromboembolic complications and other imponderables were discussed.  The patient acknowledged the explanation, agreed to proceed with the plan and consent was signed. Patient is being admitted for inpatient treatment for surgery, pain control, PT, OT, prophylactic antibiotics, VTE prophylaxis, progressive ambulation and ADL's and discharge planning.The patient is planning to be discharged home.     West Pugh Rejina Odle   PA-C  01/13/2018, 3:13 PM

## 2018-01-14 NOTE — Progress Notes (Signed)
Cardiology Office Note   Date:  01/15/2018   ID:  LESSLIE MOSSA, DOB 21-Sep-1933, MRN [id number]  PCP:  [name], MD  Cardiologist:  [facility]   Chief Complaint  Patient presents with  . Medical Clearance     History of Present Illness: Tara Potts is a 84 y.o. female who presents for preoperative cardiology evaluation.  She has a history of coronary artery disease, status post CABG in November 2007 at [facility], hypertension, hyperlipidemia, and mild carotid disease.  Most recent cardiac catheterization was in 2009 revealing diffusely diseased graft to the PDA as well as diffuse PDA disease.  At the time she was treated medically.  At that time she was without any symptoms.  No medication changes were made.  NM stress test in 01/12/2011 was normal without evidence of ischemia.    She is to have right hip replacement under spinal anesthesia by Dr. Alvan Dame on 01/28/2018. She offers no complaints of chest pain dyspnea, weakness or fatigue. Ambulation has been limited due to right hip pain.     Past Medical History:  Diagnosis Date  . CAD (coronary artery disease)   . Carotid disease, bilateral (HCC)    mild  . Fibroid   . Hx of CABG 2007   [facility]  . Hyperlipemia   . Hypertension   . Menopausal symptoms   . Peripheral neuropathy   . Seizure-like activity [facility])     Past Surgical History:  Procedure Laterality Date  . CARDIAC CATHETERIZATION  01/09/2008   diffusely diseased graft to the PDA & diffuse PDA disease  . CAROTID DOPPLER  08/27/2011   mod. right & nild left ICA stenosis  . CATARACT SURGERY    . CORONARY ARTERY BYPASS GRAFT    . FOOT SURGERY    . MASS REMOVED FROM CERVIX  12/1997   Benign endocervical inclusion cysts  . NM MYOVIEW LTD  01/12/2011   no ischemia  . TRIPLE HEART BYPASS  05/2006   [facility]  . US ECHOCARDIOGRAPHY  10/30/2007   mild MA,MR,TR,AOV mildly sclerotic,density oted in the prox asc aorta     Current Outpatient  Medications  Medication Sig Dispense Refill  . atorvastatin (LIPITOR) 40 MG tablet Take 1 tablet by mouth at bedtime.  2  . co-enzyme Q-10 30 MG capsule Take 30 mg by mouth daily.     [name] estradiol-norethindrone (COMBIPATCH) 0.05-0.14 MG/DAY PLACE 1 PATCH ONTO THE SKIN MONTHLY 8 patch 2  . fish oil-omega-3 fatty acids 1000 MG capsule Take 2 g by mouth daily.      . furosemide (LASIX) 40 MG tablet 40 mg daily.  6  . GARLIC PO Take 1 tablet by mouth 2 (two) times daily.     [name] glucosamine-chondroitin 500-400 MG tablet Take 2 tablets by mouth 2 (two) times daily.    [name] losartan-hydrochlorothiazide (HYZAAR) 50-12.5 MG tablet Take 1 tablet by mouth daily.  2  . metoprolol tartrate (LOPRESSOR) 50 MG tablet Take 1 tablet (50 mg total) by mouth 2 (two) times daily. 180 tablet 3  . potassium chloride (K-DUR) 10 MEQ tablet TAKE 1 TABLET(10 MEQ) BY MOUTH DAILY 90 tablet 3  . Ascorbic Acid (VITAMIN C PO) Take 1 tablet by mouth daily.     [name] aspirin 325 MG tablet Take 325 mg by mouth daily.      . Calcium-Magnesium-Vitamin D (CALCIUM MAGNESIUM PO) Take 1 tablet by mouth 2 (two) times daily.     . Magnesium 200 MG  TABS Take 200 mg by mouth at bedtime.    . Multiple Vitamin (MULTIVITAMIN WITH MINERALS) TABS tablet Take 1 tablet by mouth daily.     No current facility-administered medications for this visit.     Allergies:   Cymbalta [duloxetine hcl]    Social History:  The patient  reports that she has quit smoking. She has never used smokeless tobacco. She reports that she does not drink alcohol or use drugs.   Family History:  The patient's family history includes Breast cancer in her maternal aunt; Diabetes in her mother; Heart disease in her mother; Heart failure in her maternal aunt, maternal uncle, and mother; Hypertension in her mother and sister.    ROS: All other systems are reviewed and negative. Unless otherwise mentioned in H&P    PHYSICAL EXAM: VS:  LMP 07/16/2002  , BMI There is no height  or weight on file to calculate BMI. GEN: Well nourished, well developed, in no acute distress  HEENT: normal  Neck: no JVD, carotid bruits, or masses Cardiac: RRR; 1/6 systolic murmurs, rubs, or gallops,no edema  Respiratory:  Clear to auscultation bilaterally, normal work of breathing GI: soft, nontender, nondistended, + BS MS: no deformity or atrophy  Skin: warm and dry, no rash Neuro:  Strength and sensation are intact Psych: euthymic mood, full affect   EKG:  NSR rate of 78 bpm.   Recent Labs: 07/03/2017: ALT 17; BUN 25.5; Creatinine 1.5; HGB 10.9; Platelets 224; Potassium 4.6; Sodium 138    Lipid Panel No results found for: CHOL, TRIG, HDL, CHOLHDL, VLDL, LDLCALC, LDLDIRECT    Wt Readings from Last 3 Encounters:  09/26/17 198 lb (89.8 kg)  09/11/17 202 lb 6.4 oz (91.8 kg)  07/03/17 194 lb 12.8 oz (88.4 kg)      Other studies Reviewed: Nuclear medicine stress test dated 01/12/2011 (transcribed from scanned report)  Perfusion defect in the inferior myocardial region consistent with diaphragmatic attenuation.  The remaining myocardium demonstrates normal myocardial perfusion with no evidence of ischemia or infarct.    ASSESSMENT AND PLAN:  1.  Preoperative cardiac evaluation: The patient is doing well and has had no cardiac complaints, no palpitations, chest pain, dizziness, dyspnea on exertion, or profound fatigue.  The patient has not been active due to right hip pain and ambulates with a cane.   Chart reviewed as part of pre-operative protocol coverage. Given past medical history and time since last visit, based on ACC/AHA guidelines, [place] WOODFIELD would be at acceptable risk for the planned procedure without further cardiovascular testing.    2. CAD: History of coronary artery bypass grafting 2007, most recent cardiac catheterization revealing diffusely diseased graft to the PDA as well as diffuse PDA disease in 2009.  She is without any cardiac complaints today.  Will  decrease dose of aspirin to EC 81 mg daily from 325 mg daily.  No recommendations for changing any medications perioperatively.  3.  Hypertension: Blood pressure slightly elevated today.  She has not taken her medications prior to coming in and she usually waits until she eats.  She has not had any breakfast yet today.  Preoperative labs to evaluate kidney function is planned prior to surgery.  If increased creatinine may consider decreasing Lasix dose, however with IV fluids perioperatively this may not be necessary.  Defer to surgery.  4.  Hypercholesterolemia: Followed by PCP.  Continues on Lipitor 40 mg daily.  She denies myalgia pain.  With CAD, recommend LDL of 70 or  less.   Current medicines are reviewed at length with the patient today.    Labs/ tests ordered today include: None Phill Myron. West Pugh, ANP, AACC   01/15/2018 11:26 AM    [facility] [address], [place], [place] [address] Phone: [phone]; Fax: [phone]

## 2018-01-15 ENCOUNTER — Ambulatory Visit (INDEPENDENT_AMBULATORY_CARE_PROVIDER_SITE_OTHER): Payer: Medicare Other | Admitting: Adult Health

## 2018-01-15 ENCOUNTER — Encounter: Payer: Self-pay | Admitting: Adult Health

## 2018-01-15 VITALS — BP 140/78 | HR 78 | Ht 66.0 in | Wt 188.6 lb

## 2018-01-15 DIAGNOSIS — E7800 Pure hypercholesterolemia, unspecified: Secondary | ICD-10-CM

## 2018-01-15 DIAGNOSIS — I2510 Atherosclerotic heart disease of native coronary artery without angina pectoris: Secondary | ICD-10-CM

## 2018-01-15 DIAGNOSIS — I10 Essential (primary) hypertension: Secondary | ICD-10-CM

## 2018-01-15 DIAGNOSIS — Z01810 Encounter for preprocedural cardiovascular examination: Secondary | ICD-10-CM | POA: Diagnosis not present

## 2018-01-15 MED ORDER — ASPIRIN 81 MG PO TABS: 81.0000 mg | ORAL_TABLET | Freq: Every day | ORAL | Status: DC

## 2018-01-15 NOTE — Patient Instructions (Signed)
Tara Potts  01/15/2018   Your procedure is scheduled on: 01-28-18  Report to [facility] Main  Entrance  Report to admitting at       0610 AM    Call this number if you have problems the morning of surgery [phone]    Remember: Do not eat food or drink liquids :After Midnight.     Take these medicines the morning of surgery with A SIP OF WATER: metoprolol                                You may not have any metal on your body including hair pins and              piercings  Do not wear jewelry, make-up, lotions, powders or perfumes, deodorant             Do not wear nail polish.  Do not shave  48 hours prior to surgery.        Do not bring valuables to the hospital. [place].  Contacts, dentures or bridgework may not be worn into surgery.  Leave suitcase in the car. After surgery it may be brought to your room.                 Please read over the following fact sheets you were given: ____________________________________________________________________           [facility] - Preparing for Surgery Before surgery, you can play an important role.  Because skin is not sterile, your skin needs to be as free of germs as possible.  You can reduce the number of germs on your skin by washing with CHG (chlorahexidine gluconate) soap before surgery.  CHG is an antiseptic cleaner which kills germs and bonds with the skin to continue killing germs even after washing. Please DO NOT use if you have an allergy to CHG or antibacterial soaps.  If your skin becomes reddened/irritated stop using the CHG and inform your nurse when you arrive at Short Stay. Do not shave (including legs and underarms) for at least 48 hours prior to the first CHG shower.  You may shave your face/neck. Please follow these instructions carefully:  1.  Shower with CHG Soap the night before surgery and the  morning of Surgery.  2.  If you choose  to wash your hair, wash your hair first as usual with your  normal  shampoo.  3.  After you shampoo, rinse your hair and body thoroughly to remove the  shampoo.                           4.  Use CHG as you would any other liquid soap.  You can apply chg directly  to the skin and wash                       Gently with a scrungie or clean washcloth.  5.  Apply the CHG Soap to your body ONLY FROM THE NECK DOWN.   Do not use on face/ open  Wound or open sores. Avoid contact with eyes, ears mouth and genitals (private parts).                       Wash face,  Genitals (private parts) with your normal soap.             6.  Wash thoroughly, paying special attention to the area where your surgery  will be performed.  7.  Thoroughly rinse your body with warm water from the neck down.  8.  DO NOT shower/wash with your normal soap after using and rinsing off  the CHG Soap.                9.  Pat yourself dry with a clean towel.            10.  Wear clean pajamas.            11.  Place clean sheets on your bed the night of your first shower and do not  sleep with pets. Day of Surgery : Do not apply any lotions/deodorants the morning of surgery.  Please wear clean clothes to the hospital/surgery center.  FAILURE TO FOLLOW THESE INSTRUCTIONS MAY RESULT IN THE CANCELLATION OF YOUR SURGERY PATIENT SIGNATURE_________________________________  NURSE SIGNATURE__________________________________  ________________________________________________________________________  WHAT IS A BLOOD TRANSFUSION? Blood Transfusion Information  A transfusion is the replacement of blood or some of its parts. Blood is made up of multiple cells which provide different functions.  Red blood cells carry oxygen and are used for blood loss replacement.  White blood cells fight against infection.  Platelets control bleeding.  Plasma helps clot blood.  Other blood products are available for specialized  needs, such as hemophilia or other clotting disorders. BEFORE THE TRANSFUSION  Who gives blood for transfusions?   Healthy volunteers who are fully evaluated to make sure their blood is safe. This is blood bank blood. Transfusion therapy is the safest it has ever been in the practice of medicine. Before blood is taken from a donor, a complete history is taken to make sure that person has no history of diseases nor engages in risky social behavior (examples are intravenous drug use or sexual activity with multiple partners). The donor's travel history is screened to minimize risk of transmitting infections, such as malaria. The donated blood is tested for signs of infectious diseases, such as HIV and hepatitis. The blood is then tested to be sure it is compatible with you in order to minimize the chance of a transfusion reaction. If you or a relative donates blood, this is often done in anticipation of surgery and is not appropriate for emergency situations. It takes many days to process the donated blood. RISKS AND COMPLICATIONS Although transfusion therapy is very safe and saves many lives, the main dangers of transfusion include:   Getting an infectious disease.  Developing a transfusion reaction. This is an allergic reaction to something in the blood you were given. Every precaution is taken to prevent this. The decision to have a blood transfusion has been considered carefully by your caregiver before blood is given. Blood is not given unless the benefits outweigh the risks. AFTER THE TRANSFUSION  Right after receiving a blood transfusion, you will usually feel much better and more energetic. This is especially true if your red blood cells have gotten low (anemic). The transfusion raises the level of the red blood cells which carry oxygen, and this usually causes an energy increase.  The  nurse administering the transfusion will monitor you carefully for complications. HOME CARE INSTRUCTIONS   No special instructions are needed after a transfusion. You may find your energy is better. Speak with your caregiver about any limitations on activity for underlying diseases you may have. SEEK MEDICAL CARE IF:   Your condition is not improving after your transfusion.  You develop redness or irritation at the intravenous (IV) site. SEEK IMMEDIATE MEDICAL CARE IF:  Any of the following symptoms occur over the next 12 hours:  Shaking chills.  You have a temperature by mouth above 102 F (38.9 C), not controlled by medicine.  Chest, back, or muscle pain.  People around you feel you are not acting correctly or are confused.  Shortness of breath or difficulty breathing.  Dizziness and fainting.  You get a rash or develop hives.  You have a decrease in urine output.  Your urine turns a dark color or changes to pink, red, or brown. Any of the following symptoms occur over the next 10 days:  You have a temperature by mouth above 102 F (38.9 C), not controlled by medicine.  Shortness of breath.  Weakness after normal activity.  The white part of the eye turns yellow (jaundice).  You have a decrease in the amount of urine or are urinating less often.  Your urine turns a dark color or changes to pink, red, or brown. Document Released: 06/29/2000 Document Revised: 09/24/2011 Document Reviewed: 02/16/2008 ExitCare Patient Information 2014 [place].  _______________________________________________________________________  Incentive Spirometer  An incentive spirometer is a tool that can help keep your lungs clear and active. This tool measures how well you are filling your lungs with each breath. Taking long deep breaths may help reverse or decrease the chance of developing breathing (pulmonary) problems (especially infection) following:  A long period of time when you are unable to move or be active. BEFORE THE PROCEDURE   If the spirometer includes an indicator to  show your best effort, your nurse or respiratory therapist will set it to a desired goal.  If possible, sit up straight or lean slightly forward. Try not to slouch.  Hold the incentive spirometer in an upright position. INSTRUCTIONS FOR USE  1. Sit on the edge of your bed if possible, or sit up as far as you can in bed or on a chair. 2. Hold the incentive spirometer in an upright position. 3. Breathe out normally. 4. Place the mouthpiece in your mouth and seal your lips tightly around it. 5. Breathe in slowly and as deeply as possible, raising the piston or the ball toward the top of the column. 6. Hold your breath for 3-5 seconds or for as long as possible. Allow the piston or ball to fall to the bottom of the column. 7. Remove the mouthpiece from your mouth and breathe out normally. 8. Rest for a few seconds and repeat Steps 1 through 7 at least 10 times every 1-2 hours when you are awake. Take your time and take a few normal breaths between deep breaths. 9. The spirometer may include an indicator to show your best effort. Use the indicator as a goal to work toward during each repetition. 10. After each set of 10 deep breaths, practice coughing to be sure your lungs are clear. If you have an incision (the cut made at the time of surgery), support your incision when coughing by placing a pillow or rolled up towels firmly against it. Once you are able to get  out of bed, walk around indoors and cough well. You may stop using the incentive spirometer when instructed by your caregiver.  RISKS AND COMPLICATIONS  Take your time so you do not get dizzy or light-headed.  If you are in pain, you may need to take or ask for pain medication before doing incentive spirometry. It is harder to take a deep breath if you are having pain. AFTER USE  Rest and breathe slowly and easily.  It can be helpful to keep track of a log of your progress. Your caregiver can provide you with a simple table to help with  this. If you are using the spirometer at home, follow these instructions: [place] IF:   You are having difficultly using the spirometer.  You have trouble using the spirometer as often as instructed.  Your pain medication is not giving enough relief while using the spirometer.  You develop fever of 100.5 F (38.1 C) or higher. SEEK IMMEDIATE MEDICAL CARE IF:   You cough up bloody sputum that had not been present before.  You develop fever of 102 F (38.9 C) or greater.  You develop worsening pain at or near the incision site. MAKE SURE YOU:   Understand these instructions.  Will watch your condition.  Will get help right away if you are not doing well or get worse. Document Released: 11/12/2006 Document Revised: 09/24/2011 Document Reviewed: 01/13/2007 [facility] Patient Information 2014 [place], [place].   ________________________________________________________________________

## 2018-01-15 NOTE — Patient Instructions (Signed)
Medication Instructions:  CHANGE TO ASPIRIN 81MG  ENTERIC COATED  If you need a refill on your cardiac medications before your next appointment, please call your pharmacy.  Special Instructions: CLEARED FOR HIP SURGERY  Follow-Up: Your physician wants you to follow-up in: 4-6 [place]. You should receive a reminder letter in the mail two months in advance. If you do not receive a letter, please call our office SEPT 2019 to schedule the NOV 2019 follow-up appointment.   Thank you for choosing [facility]!!

## 2018-01-15 NOTE — Progress Notes (Addendum)
OVN and Clearance 01-15-18 in epic per cards   ekg 04-12-17 epic and from 01-15-18

## 2018-01-20 ENCOUNTER — Encounter (HOSPITAL_COMMUNITY)
Admission: RE | Admit: 2018-01-20 | Discharge: 2018-01-20 | Disposition: A | Discharge status: Home / Self Care | Payer: Medicare Other | Source: Ambulatory Visit | Attending: Orthopedic Surgery | Admitting: Orthopedic Surgery

## 2018-01-20 ENCOUNTER — Other Ambulatory Visit: Payer: Self-pay

## 2018-01-20 ENCOUNTER — Encounter (HOSPITAL_COMMUNITY): Payer: Self-pay

## 2018-01-20 DIAGNOSIS — Z01812 Encounter for preprocedural laboratory examination: Secondary | ICD-10-CM | POA: Insufficient documentation

## 2018-01-20 HISTORY — DX: Anemia, unspecified: D64.9

## 2018-01-20 HISTORY — DX: Unspecified osteoarthritis, unspecified site: M19.90

## 2018-01-20 HISTORY — DX: Unspecified asthma, uncomplicated: J45.909

## 2018-01-20 LAB — CBC
HCT: 34.1 % — ABNORMAL LOW (ref 36.0–46.0)
HEMOGLOBIN: 11.4 g/dL — AB (ref 12.0–15.0)
MCH: 26.1 pg (ref 26.0–34.0)
MCHC: 33.4 g/dL (ref 30.0–36.0)
MCV: 78.2 fL (ref 78.0–100.0)
Platelets: 240 10*3/uL (ref 150–400)
RBC: 4.36 MIL/uL (ref 3.87–5.11)
RDW: 16.9 % — ABNORMAL HIGH (ref 11.5–15.5)
WBC: 5.4 10*3/uL (ref 4.0–10.5)

## 2018-01-20 LAB — BASIC METABOLIC PANEL
Anion gap: 10 (ref 5–15)
BUN: 32 mg/dL — AB (ref 8–23)
CHLORIDE: 102 mmol/L (ref 98–111)
CO2: 28 mmol/L (ref 22–32)
Calcium: 9.4 mg/dL (ref 8.9–10.3)
Creatinine, Ser: 1.55 mg/dL — ABNORMAL HIGH (ref 0.44–1.00)
GFR calc Af Amer: 35 mL/min — ABNORMAL LOW (ref >60)
GFR calc non Af Amer: 30 mL/min — ABNORMAL LOW (ref >60)
Glucose, Bld: 103 mg/dL — ABNORMAL HIGH (ref 70–99)
Potassium: 4.3 mmol/L (ref 3.5–5.1)
SODIUM: 140 mmol/L (ref 135–145)

## 2018-01-20 LAB — SURGICAL PCR SCREEN
MRSA, PCR: POSITIVE — AB
Staphylococcus aureus: POSITIVE — AB

## 2018-01-20 LAB — ABO/RH: ABO/RH(D): AB POS

## 2018-01-20 NOTE — Progress Notes (Signed)
BMP done7-8-19 routed to Dr. Wynelle Link via epic

## 2018-01-24 DIAGNOSIS — Z1231 Encounter for screening mammogram for malignant neoplasm of breast: Secondary | ICD-10-CM | POA: Diagnosis not present

## 2018-01-27 ENCOUNTER — Telehealth: Payer: Self-pay | Admitting: *Deleted

## 2018-01-27 ENCOUNTER — Other Ambulatory Visit: Payer: Self-pay | Admitting: Orthopedic Surgery

## 2018-01-27 MED ORDER — ESTRADIOL-LEVONORGESTREL 0.045-0.015 MG/DAY TD PTWK: 1.0000 | MEDICATED_PATCH | TRANSDERMAL | 7 refills | Status: DC

## 2018-01-27 MED ORDER — TRANEXAMIC ACID 1000 MG/10ML IV SOLN
1000.0000 mg | INTRAVENOUS | Status: AC
  Administered 2018-01-28: 1000 mg via INTRAVENOUS
  Filled 2018-01-27: qty 1100

## 2018-01-27 NOTE — Telephone Encounter (Signed)
I sent Rx to pharmacy and they will let me know if prior authorization needed or if not covered.

## 2018-01-27 NOTE — Care Plan (Signed)
Ortho Bundle R THA scheduled on 01-28-18 DCP:  Home with dtr.  3 story home with 0 ste. DME:  Rx for RW and 3-in-1 given at H&P appt for pickup prior to surgery. PT:  HEP

## 2018-01-27 NOTE — Telephone Encounter (Signed)
What about Climara Pro patch?

## 2018-01-27 NOTE — Telephone Encounter (Signed)
Pharmacy called and combi-patch 0.05-0.14 mcg patch price has increased to $192.00, too expensive requesting alternative medication. The pharmacy couldn't tell me any preferred medication. Please advise

## 2018-01-28 ENCOUNTER — Inpatient Hospital Stay (HOSPITAL_COMMUNITY): Payer: Medicare Other

## 2018-01-28 ENCOUNTER — Encounter: Payer: Self-pay | Admitting: Gynecology

## 2018-01-28 ENCOUNTER — Inpatient Hospital Stay (HOSPITAL_COMMUNITY): Payer: Medicare Other | Admitting: Anesthesiology

## 2018-01-28 ENCOUNTER — Encounter (HOSPITAL_COMMUNITY)
Admission: RE | Disposition: A | Discharge status: Home / Self Care | Payer: Self-pay | Source: Ambulatory Visit | Attending: Orthopedic Surgery

## 2018-01-28 ENCOUNTER — Inpatient Hospital Stay (HOSPITAL_COMMUNITY)
Admission: RE | Admit: 2018-01-28 | Discharge: 2018-01-29 | DRG: 470 | Disposition: A | Discharge status: Home / Self Care | Payer: Medicare Other | Source: Ambulatory Visit | Attending: Orthopedic Surgery | Admitting: Orthopedic Surgery

## 2018-01-28 ENCOUNTER — Encounter (HOSPITAL_COMMUNITY): Payer: Self-pay | Admitting: *Deleted

## 2018-01-28 ENCOUNTER — Other Ambulatory Visit: Payer: Self-pay

## 2018-01-28 DIAGNOSIS — Z87891 Personal history of nicotine dependence: Secondary | ICD-10-CM

## 2018-01-28 DIAGNOSIS — I2510 Atherosclerotic heart disease of native coronary artery without angina pectoris: Secondary | ICD-10-CM | POA: Diagnosis present

## 2018-01-28 DIAGNOSIS — Z8249 Family history of ischemic heart disease and other diseases of the circulatory system: Secondary | ICD-10-CM | POA: Diagnosis not present

## 2018-01-28 DIAGNOSIS — M1611 Unilateral primary osteoarthritis, right hip: Principal | ICD-10-CM | POA: Diagnosis present

## 2018-01-28 DIAGNOSIS — E785 Hyperlipidemia, unspecified: Secondary | ICD-10-CM | POA: Diagnosis present

## 2018-01-28 DIAGNOSIS — Z6830 Body mass index (BMI) 30.0-30.9, adult: Secondary | ICD-10-CM | POA: Diagnosis not present

## 2018-01-28 DIAGNOSIS — Z833 Family history of diabetes mellitus: Secondary | ICD-10-CM

## 2018-01-28 DIAGNOSIS — G629 Polyneuropathy, unspecified: Secondary | ICD-10-CM | POA: Diagnosis present

## 2018-01-28 DIAGNOSIS — M5417 Radiculopathy, lumbosacral region: Secondary | ICD-10-CM | POA: Diagnosis present

## 2018-01-28 DIAGNOSIS — I739 Peripheral vascular disease, unspecified: Secondary | ICD-10-CM | POA: Diagnosis present

## 2018-01-28 DIAGNOSIS — Z951 Presence of aortocoronary bypass graft: Secondary | ICD-10-CM

## 2018-01-28 DIAGNOSIS — E66811 Obesity, class 1: Secondary | ICD-10-CM | POA: Diagnosis present

## 2018-01-28 DIAGNOSIS — Z7982 Long term (current) use of aspirin: Secondary | ICD-10-CM | POA: Diagnosis not present

## 2018-01-28 DIAGNOSIS — M25551 Pain in right hip: Secondary | ICD-10-CM | POA: Diagnosis not present

## 2018-01-28 DIAGNOSIS — E669 Obesity, unspecified: Secondary | ICD-10-CM | POA: Diagnosis present

## 2018-01-28 DIAGNOSIS — E7800 Pure hypercholesterolemia, unspecified: Secondary | ICD-10-CM | POA: Diagnosis present

## 2018-01-28 DIAGNOSIS — Z96649 Presence of unspecified artificial hip joint: Secondary | ICD-10-CM

## 2018-01-28 DIAGNOSIS — I10 Essential (primary) hypertension: Secondary | ICD-10-CM | POA: Diagnosis present

## 2018-01-28 DIAGNOSIS — Z803 Family history of malignant neoplasm of breast: Secondary | ICD-10-CM | POA: Diagnosis not present

## 2018-01-28 DIAGNOSIS — M4802 Spinal stenosis, cervical region: Secondary | ICD-10-CM | POA: Diagnosis present

## 2018-01-28 DIAGNOSIS — Z9849 Cataract extraction status, unspecified eye: Secondary | ICD-10-CM | POA: Diagnosis not present

## 2018-01-28 DIAGNOSIS — D259 Leiomyoma of uterus, unspecified: Secondary | ICD-10-CM | POA: Diagnosis present

## 2018-01-28 DIAGNOSIS — Z96641 Presence of right artificial hip joint: Secondary | ICD-10-CM

## 2018-01-28 HISTORY — PX: TOTAL HIP ARTHROPLASTY: SHX124

## 2018-01-28 LAB — TYPE AND SCREEN
ABO/RH(D): AB POS
Antibody Screen: NEGATIVE

## 2018-01-28 SURGERY — ARTHROPLASTY, HIP, TOTAL, ANTERIOR APPROACH
Anesthesia: Spinal | Site: Hip | Laterality: Right

## 2018-01-28 MED ORDER — CEFAZOLIN SODIUM-DEXTROSE 2-4 GM/100ML-% IV SOLN
2.0000 g | Freq: Four times a day (QID) | INTRAVENOUS | Status: AC
  Administered 2018-01-28 (×2): 2 g via INTRAVENOUS
  Filled 2018-01-28 (×2): qty 100

## 2018-01-28 MED ORDER — ATORVASTATIN CALCIUM 40 MG PO TABS
80.0000 mg | ORAL_TABLET | Freq: Every day | ORAL | Status: DC
  Administered 2018-01-28 – 2018-01-29 (×2): 80 mg via ORAL
  Filled 2018-01-28 (×2): qty 2

## 2018-01-28 MED ORDER — TRAMADOL HCL 50 MG PO TABS
100.0000 mg | ORAL_TABLET | Freq: Four times a day (QID) | ORAL | Status: DC | PRN
  Administered 2018-01-28 (×2): 100 mg via ORAL
  Filled 2018-01-28 (×3): qty 2

## 2018-01-28 MED ORDER — CEFAZOLIN SODIUM-DEXTROSE 2-4 GM/100ML-% IV SOLN
2.0000 g | INTRAVENOUS | Status: AC
  Administered 2018-01-28: 2 g via INTRAVENOUS
  Filled 2018-01-28: qty 100

## 2018-01-28 MED ORDER — HYDROCHLOROTHIAZIDE 12.5 MG PO CAPS
12.5000 mg | ORAL_CAPSULE | Freq: Every day | ORAL | Status: DC
  Administered 2018-01-28 – 2018-01-29 (×2): 12.5 mg via ORAL
  Filled 2018-01-28 (×2): qty 1

## 2018-01-28 MED ORDER — METOCLOPRAMIDE HCL 5 MG/ML IJ SOLN: 5.0000 mg | Freq: Three times a day (TID) | INTRAMUSCULAR | Status: DC | PRN

## 2018-01-28 MED ORDER — ONDANSETRON HCL 4 MG/2ML IJ SOLN: 4.0000 mg | Freq: Four times a day (QID) | INTRAMUSCULAR | Status: DC | PRN

## 2018-01-28 MED ORDER — VANCOMYCIN HCL IN DEXTROSE 1-5 GM/200ML-% IV SOLN
1000.0000 mg | INTRAVENOUS | Status: AC
  Administered 2018-01-28: 1000 mg via INTRAVENOUS
  Filled 2018-01-28: qty 200

## 2018-01-28 MED ORDER — DEXAMETHASONE SODIUM PHOSPHATE 10 MG/ML IJ SOLN
10.0000 mg | Freq: Once | INTRAMUSCULAR | Status: AC
  Administered 2018-01-28: 10 mg via INTRAVENOUS

## 2018-01-28 MED ORDER — ONDANSETRON HCL 4 MG PO TABS: 4.0000 mg | ORAL_TABLET | Freq: Four times a day (QID) | ORAL | Status: DC | PRN

## 2018-01-28 MED ORDER — POLYETHYLENE GLYCOL 3350 17 G PO PACK: 17.0000 g | PACK | Freq: Two times a day (BID) | ORAL | 0 refills | Status: DC

## 2018-01-28 MED ORDER — DOCUSATE SODIUM 100 MG PO CAPS: 100.0000 mg | ORAL_CAPSULE | Freq: Two times a day (BID) | ORAL | 0 refills | Status: DC

## 2018-01-28 MED ORDER — ACETAMINOPHEN 500 MG PO TABS
500.0000 mg | ORAL_TABLET | Freq: Four times a day (QID) | ORAL | Status: AC
  Administered 2018-01-28 – 2018-01-29 (×4): 500 mg via ORAL
  Filled 2018-01-28 (×4): qty 1

## 2018-01-28 MED ORDER — METHOCARBAMOL 500 MG PO TABS: 500.0000 mg | ORAL_TABLET | Freq: Four times a day (QID) | ORAL | 0 refills | Status: DC | PRN

## 2018-01-28 MED ORDER — METOPROLOL TARTRATE 50 MG PO TABS
75.0000 mg | ORAL_TABLET | Freq: Two times a day (BID) | ORAL | Status: DC
  Administered 2018-01-28 – 2018-01-29 (×2): 75 mg via ORAL
  Filled 2018-01-28 (×4): qty 1

## 2018-01-28 MED ORDER — PROPOFOL 500 MG/50ML IV EMUL
INTRAVENOUS | Status: DC | PRN
  Administered 2018-01-28: 50 ug/kg/min via INTRAVENOUS

## 2018-01-28 MED ORDER — ASPIRIN 81 MG PO CHEW
81.0000 mg | CHEWABLE_TABLET | Freq: Two times a day (BID) | ORAL | Status: DC
  Administered 2018-01-28 – 2018-01-29 (×2): 81 mg via ORAL
  Filled 2018-01-28 (×2): qty 1

## 2018-01-28 MED ORDER — ALUM & MAG HYDROXIDE-SIMETH 200-200-20 MG/5ML PO SUSP: 15.0000 mL | ORAL | Status: DC | PRN

## 2018-01-28 MED ORDER — PROMETHAZINE HCL 25 MG/ML IJ SOLN: 6.2500 mg | INTRAMUSCULAR | Status: DC | PRN

## 2018-01-28 MED ORDER — TRAMADOL HCL 50 MG PO TABS
50.0000 mg | ORAL_TABLET | Freq: Four times a day (QID) | ORAL | Status: DC | PRN
  Administered 2018-01-29: 50 mg via ORAL
  Filled 2018-01-28: qty 1

## 2018-01-28 MED ORDER — METHOCARBAMOL 500 MG PO TABS
500.0000 mg | ORAL_TABLET | Freq: Four times a day (QID) | ORAL | Status: DC | PRN
  Administered 2018-01-28: 500 mg via ORAL
  Filled 2018-01-28: qty 1

## 2018-01-28 MED ORDER — PROPOFOL 500 MG/50ML IV EMUL
INTRAVENOUS | Status: DC | PRN
  Administered 2018-01-28: 20 mg via INTRAVENOUS
  Administered 2018-01-28: 10 mg via INTRAVENOUS
  Administered 2018-01-28: 20 mg via INTRAVENOUS

## 2018-01-28 MED ORDER — TRANEXAMIC ACID 1000 MG/10ML IV SOLN
1000.0000 mg | Freq: Once | INTRAVENOUS | Status: AC
  Administered 2018-01-28: 1000 mg via INTRAVENOUS
  Filled 2018-01-28: qty 10

## 2018-01-28 MED ORDER — ASPIRIN 81 MG PO CHEW: 81.0000 mg | CHEWABLE_TABLET | Freq: Two times a day (BID) | ORAL | 0 refills | Status: AC

## 2018-01-28 MED ORDER — FUROSEMIDE 40 MG PO TABS
40.0000 mg | ORAL_TABLET | Freq: Every day | ORAL | Status: DC
  Administered 2018-01-28 – 2018-01-29 (×2): 40 mg via ORAL
  Filled 2018-01-28 (×2): qty 1

## 2018-01-28 MED ORDER — PROPOFOL 10 MG/ML IV BOLUS
INTRAVENOUS | Status: AC
  Filled 2018-01-28: qty 20

## 2018-01-28 MED ORDER — BUPIVACAINE IN DEXTROSE 0.75-8.25 % IT SOLN
INTRATHECAL | Status: DC | PRN
  Administered 2018-01-28: 1.8 mL via INTRATHECAL

## 2018-01-28 MED ORDER — PHENOL 1.4 % MT LIQD: 1.0000 | OROMUCOSAL | Status: DC | PRN

## 2018-01-28 MED ORDER — FERROUS SULFATE 325 (65 FE) MG PO TABS: 325.0000 mg | ORAL_TABLET | Freq: Three times a day (TID) | ORAL | 3 refills | Status: DC

## 2018-01-28 MED ORDER — POTASSIUM CHLORIDE ER 10 MEQ PO TBCR
10.0000 meq | EXTENDED_RELEASE_TABLET | Freq: Every day | ORAL | Status: DC
  Administered 2018-01-28: 10 meq via ORAL
  Filled 2018-01-28 (×3): qty 1

## 2018-01-28 MED ORDER — LOSARTAN POTASSIUM 50 MG PO TABS
50.0000 mg | ORAL_TABLET | Freq: Every day | ORAL | Status: DC
  Administered 2018-01-28 – 2018-01-29 (×2): 50 mg via ORAL
  Filled 2018-01-28 (×2): qty 1

## 2018-01-28 MED ORDER — MAGNESIUM CITRATE PO SOLN: 1.0000 | Freq: Once | ORAL | Status: DC | PRN

## 2018-01-28 MED ORDER — METOPROLOL TARTRATE 25 MG PO TABS
75.0000 mg | ORAL_TABLET | Freq: Once | ORAL | Status: AC
  Administered 2018-01-28: 75 mg via ORAL
  Filled 2018-01-28: qty 1

## 2018-01-28 MED ORDER — SODIUM CHLORIDE 0.9 % IV SOLN
INTRAVENOUS | Status: DC | PRN
  Administered 2018-01-28: 25 ug/min via INTRAVENOUS

## 2018-01-28 MED ORDER — BISACODYL 10 MG RE SUPP: 10.0000 mg | Freq: Every day | RECTAL | Status: DC | PRN

## 2018-01-28 MED ORDER — FENTANYL CITRATE (PF) 100 MCG/2ML IJ SOLN: 25.0000 ug | INTRAMUSCULAR | Status: DC | PRN

## 2018-01-28 MED ORDER — SODIUM CHLORIDE 0.9 % IV SOLN
INTRAVENOUS | Status: DC
  Administered 2018-01-28: 13:00:00 via INTRAVENOUS

## 2018-01-28 MED ORDER — FERROUS SULFATE 325 (65 FE) MG PO TABS
325.0000 mg | ORAL_TABLET | Freq: Three times a day (TID) | ORAL | Status: DC
  Administered 2018-01-28 – 2018-01-29 (×4): 325 mg via ORAL
  Filled 2018-01-28 (×4): qty 1

## 2018-01-28 MED ORDER — LACTATED RINGERS IV SOLN
INTRAVENOUS | Status: DC
  Administered 2018-01-28 (×3): via INTRAVENOUS

## 2018-01-28 MED ORDER — CELECOXIB 200 MG PO CAPS
200.0000 mg | ORAL_CAPSULE | Freq: Two times a day (BID) | ORAL | Status: DC
  Administered 2018-01-28: 200 mg via ORAL
  Filled 2018-01-28: qty 1

## 2018-01-28 MED ORDER — PROPOFOL 10 MG/ML IV BOLUS
INTRAVENOUS | Status: AC
  Filled 2018-01-28: qty 40

## 2018-01-28 MED ORDER — ONDANSETRON HCL 4 MG/2ML IJ SOLN
INTRAMUSCULAR | Status: DC | PRN
  Administered 2018-01-28: 4 mg via INTRAVENOUS

## 2018-01-28 MED ORDER — POLYETHYLENE GLYCOL 3350 17 G PO PACK
17.0000 g | PACK | Freq: Two times a day (BID) | ORAL | Status: DC
  Administered 2018-01-28 – 2018-01-29 (×2): 17 g via ORAL
  Filled 2018-01-28 (×2): qty 1

## 2018-01-28 MED ORDER — METOCLOPRAMIDE HCL 5 MG PO TABS: 5.0000 mg | ORAL_TABLET | Freq: Three times a day (TID) | ORAL | Status: DC | PRN

## 2018-01-28 MED ORDER — METHOCARBAMOL 1000 MG/10ML IJ SOLN
500.0000 mg | Freq: Four times a day (QID) | INTRAMUSCULAR | Status: DC | PRN
  Administered 2018-01-28: 500 mg via INTRAVENOUS
  Filled 2018-01-28: qty 550

## 2018-01-28 MED ORDER — PHENYLEPHRINE HCL 10 MG/ML IJ SOLN
INTRAMUSCULAR | Status: AC
  Filled 2018-01-28: qty 1

## 2018-01-28 MED ORDER — CHLORHEXIDINE GLUCONATE 4 % EX LIQD: 60.0000 mL | Freq: Once | CUTANEOUS | Status: DC

## 2018-01-28 MED ORDER — DEXAMETHASONE SODIUM PHOSPHATE 10 MG/ML IJ SOLN
10.0000 mg | Freq: Once | INTRAMUSCULAR | Status: AC
  Administered 2018-01-29: 10 mg via INTRAVENOUS
  Filled 2018-01-28: qty 1

## 2018-01-28 MED ORDER — DIPHENHYDRAMINE HCL 12.5 MG/5ML PO ELIX: 12.5000 mg | ORAL_SOLUTION | ORAL | Status: DC | PRN

## 2018-01-28 MED ORDER — 0.9 % SODIUM CHLORIDE (POUR BTL) OPTIME
TOPICAL | Status: DC | PRN
  Administered 2018-01-28: 1000 mL

## 2018-01-28 MED ORDER — ACETAMINOPHEN 500 MG PO TABS: 1000.0000 mg | ORAL_TABLET | Freq: Three times a day (TID) | ORAL | 0 refills | Status: DC

## 2018-01-28 MED ORDER — MENTHOL 3 MG MT LOZG: 1.0000 | LOZENGE | OROMUCOSAL | Status: DC | PRN

## 2018-01-28 MED ORDER — DOCUSATE SODIUM 100 MG PO CAPS
100.0000 mg | ORAL_CAPSULE | Freq: Two times a day (BID) | ORAL | Status: DC
  Administered 2018-01-28 – 2018-01-29 (×2): 100 mg via ORAL
  Filled 2018-01-28 (×2): qty 1

## 2018-01-28 MED ORDER — LOSARTAN POTASSIUM-HCTZ 50-12.5 MG PO TABS: 1.0000 | ORAL_TABLET | Freq: Every day | ORAL | Status: DC

## 2018-01-28 MED ORDER — TRAMADOL HCL 50 MG PO TABS: 50.0000 mg | ORAL_TABLET | Freq: Four times a day (QID) | ORAL | 0 refills | Status: DC | PRN

## 2018-01-28 MED ORDER — MORPHINE SULFATE (PF) 2 MG/ML IV SOLN: 0.5000 mg | INTRAVENOUS | Status: DC | PRN

## 2018-01-28 SURGICAL SUPPLY — 40 items
ADH SKN CLS APL DERMABOND .7 (GAUZE/BANDAGES/DRESSINGS) ×1
BAG DECANTER FOR FLEXI CONT (MISCELLANEOUS) IMPLANT
BAG SPEC THK2 15X12 ZIP CLS (MISCELLANEOUS)
BAG ZIPLOCK 12X15 (MISCELLANEOUS) IMPLANT
BLADE SAG 18X100X1.27 (BLADE) ×3 IMPLANT
CAPT HIP TOTAL 2 ×2 IMPLANT
COVER PERINEAL POST (MISCELLANEOUS) ×3 IMPLANT
COVER SURGICAL LIGHT HANDLE (MISCELLANEOUS) ×3 IMPLANT
DERMABOND ADVANCED (GAUZE/BANDAGES/DRESSINGS) ×2
DERMABOND ADVANCED .7 DNX12 (GAUZE/BANDAGES/DRESSINGS) ×1 IMPLANT
DRAPE STERI IOBAN 125X83 (DRAPES) ×3 IMPLANT
DRAPE U-SHAPE 47X51 STRL (DRAPES) ×8 IMPLANT
DRESSING AQUACEL AG SP 3.5X10 (GAUZE/BANDAGES/DRESSINGS) ×1 IMPLANT
DRSG AQUACEL AG SP 3.5X10 (GAUZE/BANDAGES/DRESSINGS) ×3
DURAPREP 26ML APPLICATOR (WOUND CARE) ×3 IMPLANT
ELECT REM PT RETURN 15FT ADLT (MISCELLANEOUS) ×3 IMPLANT
GLOVE BIOGEL M STRL SZ7.5 (GLOVE) ×2 IMPLANT
GLOVE BIOGEL PI IND STRL 7.5 (GLOVE) ×1 IMPLANT
GLOVE BIOGEL PI IND STRL 8.5 (GLOVE) ×1 IMPLANT
GLOVE BIOGEL PI IND STRL 9 (GLOVE) IMPLANT
GLOVE BIOGEL PI INDICATOR 7.5 (GLOVE) ×2
GLOVE BIOGEL PI INDICATOR 8.5 (GLOVE)
GLOVE BIOGEL PI INDICATOR 9 (GLOVE) ×2
GLOVE ECLIPSE 8.0 STRL XLNG CF (GLOVE) ×2 IMPLANT
GLOVE ECLIPSE 8.5 STRL (GLOVE) ×2 IMPLANT
GLOVE ORTHO TXT STRL SZ7.5 (GLOVE) ×3 IMPLANT
GOWN STRL REUS W/TWL 2XL LVL3 (GOWN DISPOSABLE) ×3 IMPLANT
GOWN STRL REUS W/TWL LRG LVL3 (GOWN DISPOSABLE) ×3 IMPLANT
GOWN STRL REUS W/TWL XL LVL3 (GOWN DISPOSABLE) ×2 IMPLANT
HOLDER FOLEY CATH W/STRAP (MISCELLANEOUS) ×3 IMPLANT
PACK ANTERIOR HIP CUSTOM (KITS) ×3 IMPLANT
SUT MNCRL AB 4-0 PS2 18 (SUTURE) ×3 IMPLANT
SUT STRATAFIX 0 PDS 27 VIOLET (SUTURE) ×3
SUT VIC AB 1 CT1 36 (SUTURE) ×9 IMPLANT
SUT VIC AB 2-0 CT1 27 (SUTURE) ×6
SUT VIC AB 2-0 CT1 TAPERPNT 27 (SUTURE) ×2 IMPLANT
SUTURE STRATFX 0 PDS 27 VIOLET (SUTURE) ×1 IMPLANT
TRAY FOLEY CATH 14FR (SET/KITS/TRAYS/PACK) ×2 IMPLANT
WATER STERILE IRR 1000ML POUR (IV SOLUTION) ×5 IMPLANT
YANKAUER SUCT BULB TIP 10FT TU (MISCELLANEOUS) IMPLANT

## 2018-01-28 NOTE — Evaluation (Signed)
Physical Therapy Evaluation Patient Details Name: Tara Potts MRN: [id number] DOB: 1934/04/14 Today's Date: 01/28/2018   History of Present Illness  Patient is an 84 y/o female admitted for R THA.  PMH positive for CAD, CABG, HTN, HLD, peripheral neuropathy.  Clinical Impression  Patient presents with decreased independence with mobility and today unable to tolerate OOB due to symptomatic with BP 88/43 sitting EOB.  Feel she will benefit from skilled PT in the acute setting to allow d/c home with assist.     Follow Up Recommendations Follow surgeon's recommendation for DC plan and follow-up therapies    Equipment Recommendations  Other (comment)(TBA)    Recommendations for Other Services       Precautions / Restrictions Precautions Precautions: Fall      Mobility  Bed Mobility Overal bed mobility: Needs Assistance Bed Mobility: Supine to Sit;Sit to Supine     Supine to sit: Mod assist;HOB elevated Sit to supine: +2 for safety/equipment;Mod assist   General bed mobility comments: assist for scooting, R leg and lifting trunk; to supine +2 for safety due to orthostatic  Transfers                 General transfer comment: NT due to symptoms sitting and BP 88/43  Ambulation/Gait                Stairs            Wheelchair Mobility    Modified Rankin (Stroke Patients Only)       Balance                                             Pertinent Vitals/Pain Pain Assessment: Faces Faces Pain Scale: Hurts even more Pain Location: R hip Pain Descriptors / Indicators: Grimacing;Aching;Guarding;Operative site guarding Pain Intervention(s): Monitored during session;Repositioned;Ice applied    Home Living Family/patient expects to be discharged to:: Private residence Living Arrangements: Children Available Help at Discharge: Family                  Prior Function Level of Independence: Independent                Hand Dominance        Extremity/Trunk Assessment   Upper Extremity Assessment Upper Extremity Assessment: Overall WFL for tasks assessed    Lower Extremity Assessment Lower Extremity Assessment: RLE deficits/detail RLE Deficits / Details: AAROM limited by pain, knee ext 3/5       Communication   Communication: No difficulties  Cognition Arousal/Alertness: Awake/alert Behavior During Therapy: WFL for tasks assessed/performed Overall Cognitive Status: Within Functional Limits for tasks assessed                                        General Comments      Exercises Total Joint Exercises Ankle Circles/Pumps: AROM;10 reps;Both;Supine Short Arc Quad: AROM;5 reps;Right;Supine Heel Slides: AAROM;Both;5 reps;Supine   Assessment/Plan    PT Assessment Patient needs continued PT services  PT Problem List Decreased strength;Decreased activity tolerance;Decreased balance;Pain;Decreased range of motion;Decreased mobility;Decreased knowledge of use of DME       PT Treatment Interventions Functional mobility training;Balance training;Stair training;Gait training;Therapeutic exercise;Patient/family education;DME instruction;Therapeutic activities    PT Goals (Current goals can be found in the Care Plan  section)  Acute Rehab PT Goals Patient Stated Goal: to get stronger PT Goal Formulation: With patient Time For Goal Achievement: 02/01/18 Potential to Achieve Goals: Good    Frequency 7X/week   Barriers to discharge        Co-evaluation               AM-PAC PT "6 Clicks" Daily Activity  Outcome Measure Difficulty turning over in bed (including adjusting bedclothes, sheets and blankets)?: Unable Difficulty moving from lying on back to sitting on the side of the bed? : Unable Difficulty sitting down on and standing up from a chair with arms (e.g., wheelchair, bedside commode, etc,.)?: Unable Help needed moving to and from a bed to chair (including  a wheelchair)?: Total Help needed walking in hospital room?: Total Help needed climbing 3-5 steps with a railing? : Total 6 Click Score: 6    End of Session Equipment Utilized During Treatment: Gait belt Activity Tolerance: Treatment limited secondary to medical complications (Comment)(orhtostatic) Patient left: in bed;with call bell/phone within reach;with family/visitor present   PT Visit Diagnosis: Difficulty in walking, not elsewhere classified (R26.2);Pain Pain - Right/Left: Right Pain - part of body: Hip    Time: 6384-5364 PT Time Calculation (min) (ACUTE ONLY): 24 min   Charges:   PT Evaluation $PT Eval Moderate Complexity: 1 Mod PT Treatments $Therapeutic Activity: 8-22 mins   PT G CodesMagda Kiel, [place] [phone] 01/28/2018   Reginia Naas 01/28/2018, 4:55 PM

## 2018-01-28 NOTE — Interval H&P Note (Signed)
History and Physical Interval Note:  01/28/2018 7:02 AM  Tara Potts  has presented today for surgery, with the diagnosis of Right hip osteoarthritis  The various methods of treatment have been discussed with the patient and family. After consideration of risks, benefits and other options for treatment, the patient has consented to  Procedure(s) with comments: RIGHT TOTAL HIP ARTHROPLASTY ANTERIOR APPROACH (Right) - 70 mins as a surgical intervention .  The patient's history has been reviewed, patient examined, no change in status, stable for surgery.  I have reviewed the patient's chart and labs.  Questions were answered to the patient's satisfaction.     Mauri Pole

## 2018-01-28 NOTE — Anesthesia Procedure Notes (Signed)
Spinal  Patient location during procedure: OR Start time: 01/28/2018 8:35 AM End time: 01/28/2018 8:39 AM Staffing Anesthesiologist: Singer, James, MD Resident/CRNA: ,  G, CRNA Performed: resident/CRNA  Preanesthetic Checklist Completed: patient identified, site marked, surgical consent, pre-op evaluation, timeout performed, IV checked, risks and benefits discussed and monitors and equipment checked Spinal Block Patient position: sitting Prep: DuraPrep Patient monitoring: heart rate, continuous pulse ox and blood pressure Approach: midline Location: L2-3 Needle Needle type: Pencan  Needle gauge: 24 G Needle length: 9 cm Needle insertion depth: 6 cm Assessment Sensory level: T6 Additional Notes Kit expiration date checked and verified.  Sterile prep and drape, skin local with 1% xylo, stick x 1, -heme, -paresthesia, + CSF pre and post injection. Patient tolerated procedure well.     

## 2018-01-28 NOTE — Anesthesia Preprocedure Evaluation (Addendum)
Anesthesia Evaluation  Patient identified by MRN, date of birth, ID band Patient awake    Reviewed: Allergy & Precautions, NPO status , Patient's Chart, lab work & pertinent test results  History of Anesthesia Complications Negative for: history of anesthetic complications  Airway Mallampati: II  TM Distance: >3 FB Neck ROM: Full    Dental no notable dental hx. (+) Dental Advisory Given   Pulmonary former smoker,    Pulmonary exam normal        Cardiovascular hypertension, Pt. on medications (-) angina+ CAD and + CABG  Normal cardiovascular exam     Neuro/Psych negative neurological ROS  negative psych ROS   GI/Hepatic negative GI ROS, Neg liver ROS,   Endo/Other  negative endocrine ROS  Renal/GU negative Renal ROS     Musculoskeletal   Abdominal   Peds  Hematology   Anesthesia Other Findings   Reproductive/Obstetrics                             Anesthesia Physical Anesthesia Plan  ASA: III  Anesthesia Plan: Spinal   Post-op Pain Management:    Induction:   PONV Risk Score and Plan: 2 and Ondansetron and Propofol infusion  Airway Management Planned: Natural Airway and Simple Face Mask  Additional Equipment:   Intra-op Plan:   Post-operative Plan:   Informed Consent: I have reviewed the patients History and Physical, chart, labs and discussed the procedure including the risks, benefits and alternatives for the proposed anesthesia with the patient or authorized representative who has indicated his/her understanding and acceptance.   Dental advisory given  Plan Discussed with: CRNA and Anesthesiologist  Anesthesia Plan Comments:         Anesthesia Quick Evaluation

## 2018-01-28 NOTE — Anesthesia Postprocedure Evaluation (Signed)
Anesthesia Post Note  Patient: Tara Potts  Procedure(s) Performed: RIGHT TOTAL HIP ARTHROPLASTY ANTERIOR APPROACH (Right Hip)     Patient location during evaluation: PACU Anesthesia Type: Spinal Level of consciousness: awake and alert Pain management: pain level controlled Vital Signs Assessment: post-procedure vital signs reviewed and stable Respiratory status: spontaneous breathing and respiratory function stable Cardiovascular status: blood pressure returned to baseline and stable Postop Assessment: spinal receding Anesthetic complications: no    Last Vitals:  Vitals:   01/28/18 1100 01/28/18 1115  BP: 113/67 123/73  Pulse: 68 (!) 112  Resp: 13 14  Temp:    SpO2: 98% 98%    Last Pain:  Vitals:   01/28/18 1115  TempSrc:   PainSc: 0-No pain                 Echo [place] DANIEL

## 2018-01-28 NOTE — Op Note (Signed)
NAME:  Tara Potts                ACCOUNT NO.: [id number]      MEDICAL RECORD NO.: [id number]      FACILITY:  [facility]      PHYSICIAN:  Mauri Pole  DATE OF BIRTH:  [birth year]     DATE OF PROCEDURE:  01/28/2018                                 OPERATIVE REPORT         PREOPERATIVE DIAGNOSIS: Right  hip osteoarthritis.      POSTOPERATIVE DIAGNOSIS:  Right hip osteoarthritis.      PROCEDURE:  Right total hip replacement through an anterior approach   utilizing DePuy THR system, component size 44mm pinnacle cup, a size 32+4 neutral   Altrex liner, a size 5 Tri Lock stem with a 32+1 Articuleze metal ball.      SURGEON:  Pietro Cassis. Alvan Dame, M.D.      ASSISTANT:  Nehemiah Massed, PA-C     ANESTHESIA:  Spinal.      SPECIMENS:  None.      COMPLICATIONS:  None.      BLOOD LOSS:  250 cc     DRAINS:  None.      INDICATION OF THE PROCEDURE:  Tara Potts is a 84 y.o. female who had   presented to office for evaluation of right hip pain.  Radiographs revealed   progressive degenerative changes with bone-on-bone   articulation of the  hip joint, including subchondral cystic changes and osteophytes.  The patient had painful limited range of   motion significantly affecting their overall quality of life and function.  The patient was failing to    respond to conservative measures including medications and/or injections and activity modification and at this point was ready   to proceed with more definitive measures.  Consent was obtained for   benefit of pain relief.  Specific risks of infection, DVT, component   failure, dislocation, neurovascular injury, and need for revision surgery were reviewed in the office as well discussion of   the anterior versus posterior approach were reviewed.     PROCEDURE IN DETAIL:  The patient was brought to operative theater.   Once adequate anesthesia, preoperative antibiotics, 2 gm of Ancef, 1 gm of Tranexamic Acid, and 10 mg of  Decadron were administered, the patient was positioned supine on the [employer] table.  Once the patient was safely positioned with adequate padding of boney prominences we predraped out the hip, and used fluoroscopy to confirm orientation of the pelvis.      The right hip was then prepped and draped from proximal iliac crest to   mid thigh with a shower curtain technique.      Time-out was performed identifying the patient, planned procedure, and the appropriate extremity.     An incision was then made 2 cm lateral to the   anterior superior iliac spine extending over the orientation of the   tensor fascia lata muscle and sharp dissection was carried down to the   fascia of the muscle.      The fascia was then incised.  The muscle belly was identified and swept   laterally and retractor placed along the superior neck.  Following   cauterization of the circumflex vessels and removing some pericapsular  fat, a second cobra retractor was placed on the inferior neck.  A T-capsulotomy was made along the line of the   superior neck to the trochanteric fossa, then extended proximally and   distally.  Tag sutures were placed and the retractors were then placed   intracapsular.  We then identified the trochanteric fossa and   orientation of my neck cut and then made a neck osteotomy with the femur on traction.  The femoral   head was removed without difficulty or complication.  Traction was let   off and retractors were placed posterior and anterior around the   acetabulum.      The labrum and foveal tissue were debrided.  I began reaming with a 44 mm   reamer and reamed up to 49 mm reamer with good bony bed preparation and a 50 mm  cup was chosen.  The final 50 mm Pinnacle cup was then impacted under fluoroscopy to confirm the depth of penetration and orientation with respect to   Abduction and forward flexion.  A screw was placed into the ilium followed by the hole eliminator.  The final   32+4  neutral Altrex liner was impacted with good visualized rim fit.  The cup was positioned anatomically within the acetabular portion of the pelvis.      At this point, the femur was rolled to 100 degrees.  Further capsule was   released off the inferior aspect of the femoral neck.  I then   released the superior capsule proximally.  With the leg in a neutral position the hook was placed laterally   along the femur under the vastus lateralis origin and elevated manually and then held in position using the hook attachment on the bed.  The leg was then extended and adducted with the leg rolled to 100   degrees of external rotation.  Retractors were placed along the medial calcar and posteriorly over the greater trochanter.  Once the proximal femur was fully   exposed, I used a box osteotome to set orientation.  I then began   broaching with the starting chili pepper broach and passed this by hand and then broached up to 5.  With the 5 broach in place I chose a high offset neck and did several trial reductions.  The offset was appropriate, leg lengths   appeared to be equal best matched with the +1 head ball trial confirmed radiographically.   Given these findings, I went ahead and dislocated the hip, repositioned all   retractors and positioned the right hip in the extended and abducted position.  The final 5 Hi Tri Lock stem was   chosen and it was impacted down to the level of neck cut.  Based on this   and the trial reductions, a final 32+1 Articuleze metal head ball was chosen and   impacted onto a clean and dry trunnion, and the hip was reduced.  The   hip had been irrigated throughout the case again at this point.  I did   reapproximate the superior capsular leaflet to the anterior leaflet   using #1 Vicryl.  The fascia of the   tensor fascia lata muscle was then reapproximated using #1 Vicryl and #0 Stratafix sutures.  The   remaining wound was closed with 2-0 Vicryl and running 4-0 Monocryl.    The hip was cleaned, dried, and dressed sterilely using Dermabond and   Aquacel dressing.  The patient was then brought   to recovery  room in stable condition tolerating the procedure well.    Nehemiah Massed, PA-C was present for the entirety of the case involved from   preoperative positioning, perioperative retractor management, general   facilitation of the case, as well as primary wound closure as assistant.            Pietro Cassis Alvan Dame, M.D.        01/28/2018 10:09 AM

## 2018-01-28 NOTE — Plan of Care (Signed)
Pt alert and oriented, family at the bedside.  Pain controlled with PO pain meds.  RN will monitor.

## 2018-01-28 NOTE — Discharge Instructions (Signed)

## 2018-01-28 NOTE — Transfer of Care (Signed)
Immediate Anesthesia Transfer of Care Note  Patient: Tara Potts  Procedure(s) Performed: RIGHT TOTAL HIP ARTHROPLASTY ANTERIOR APPROACH (Right Hip)  Patient Location: PACU  Anesthesia Type:Spinal  Level of Consciousness: awake  Airway & Oxygen Therapy: Patient Spontanous Breathing and Patient connected to face mask oxygen  Post-op Assessment: Report given to RN and Post -op Vital signs reviewed and stable  Post vital signs: Reviewed and stable  Last Vitals:  Vitals Value Taken Time  BP 92/63 01/28/2018 10:39 AM  Temp    Pulse 65 01/28/2018 10:43 AM  Resp 23 01/28/2018 10:43 AM  SpO2 100 % 01/28/2018 10:43 AM  Vitals shown include unvalidated device data.  Last Pain:  Vitals:   01/28/18 0655  TempSrc:   PainSc: 0-No pain         Complications: No apparent anesthesia complications

## 2018-01-29 ENCOUNTER — Encounter (HOSPITAL_COMMUNITY): Payer: Self-pay | Admitting: Orthopedic Surgery

## 2018-01-29 DIAGNOSIS — E669 Obesity, unspecified: Secondary | ICD-10-CM | POA: Diagnosis present

## 2018-01-29 LAB — CBC
HCT: 28.1 % — ABNORMAL LOW (ref 36.0–46.0)
Hemoglobin: 9.5 g/dL — ABNORMAL LOW (ref 12.0–15.0)
MCH: 26.3 pg (ref 26.0–34.0)
MCHC: 33.8 g/dL (ref 30.0–36.0)
MCV: 77.8 fL — AB (ref 78.0–100.0)
PLATELETS: 178 10*3/uL (ref 150–400)
RBC: 3.61 MIL/uL — AB (ref 3.87–5.11)
RDW: 16.9 % — ABNORMAL HIGH (ref 11.5–15.5)
WBC: 9.3 10*3/uL (ref 4.0–10.5)

## 2018-01-29 LAB — BASIC METABOLIC PANEL
ANION GAP: 9 (ref 5–15)
BUN: 43 mg/dL — ABNORMAL HIGH (ref 8–23)
CALCIUM: 8.2 mg/dL — AB (ref 8.9–10.3)
CO2: 24 mmol/L (ref 22–32)
Chloride: 104 mmol/L (ref 98–111)
Creatinine, Ser: 1.89 mg/dL — ABNORMAL HIGH (ref 0.44–1.00)
GFR calc Af Amer: 27 mL/min — ABNORMAL LOW (ref >60)
GFR, EST NON AFRICAN AMERICAN: 24 mL/min — AB (ref >60)
Glucose, Bld: 126 mg/dL — ABNORMAL HIGH (ref 70–99)
POTASSIUM: 3.5 mmol/L (ref 3.5–5.1)
SODIUM: 137 mmol/L (ref 135–145)

## 2018-01-29 MED ORDER — POTASSIUM CHLORIDE CRYS ER 20 MEQ PO TBCR
40.0000 meq | EXTENDED_RELEASE_TABLET | Freq: Once | ORAL | Status: AC
  Administered 2018-01-29: 40 meq via ORAL
  Filled 2018-01-29: qty 2

## 2018-01-29 NOTE — Progress Notes (Signed)
     Subjective: 1 Day Post-Op Procedure(s) (LRB): RIGHT TOTAL HIP ARTHROPLASTY ANTERIOR APPROACH (Right)   Patient reports pain as mild, pain controlled.  No events throughout the night.  She knows that her BUN and Creat were elevated prior to surgery.  We have discussed staying well hydrated in the post-op period.  No events throughout the night. Looking forward to getting home and improving her condition.  Ready to be discharged home.       Objective:   VITALS:   Vitals:   01/29/18 0238 01/29/18 0619  BP: (!) 159/62 (!) 178/63  Pulse: 72 70  Resp: 16 16  Temp: 98.3 F (36.8 C) 98.1 F (36.7 C)  SpO2: 100% 100%    Dorsiflexion/Plantar flexion intact Incision: dressing C/D/I No cellulitis present Compartment soft  LABS Recent Labs    01/29/18 0554  HGB 9.5*  HCT 28.1*  WBC 9.3  PLT 178    Recent Labs    01/29/18 0554  NA 137  K 3.5  BUN 43*  CREATININE 1.89*  GLUCOSE 126*     Assessment/Plan: 1 Day Post-Op Procedure(s) (LRB): RIGHT TOTAL HIP ARTHROPLASTY ANTERIOR APPROACH (Right) Foley cath d/c'ed Advance diet Up with therapy D/C IV fluids Discharge home Follow up in 2 weeks at [facility] Follow up with OLIN,Karlene Southard D in 2 weeks.  Contact information:  [facility]) [address], [place] [phone]    Obese (BMI 30-39.9) Estimated body mass index is 30.67 kg/m as calculated from the following:   Height as of this encounter: 5\' 6"  (1.676 m).   Weight as of this encounter: 86.2 kg (190 lb). Patient also counseled that weight may inhibit the healing process Patient counseled that losing weight will help with future health issues         [place]. Marycarmen Hagey   PAC  01/29/2018, 9:13 AM

## 2018-01-29 NOTE — Plan of Care (Signed)
Pt alert and oriented, daughter at bedside. Plan of care discussed with pt and daughter. RN will monitor.

## 2018-01-29 NOTE — Progress Notes (Signed)
PT BID Treatment Note   01/29/18 1600  PT Visit Information  Last PT Received On 01/29/18  Assistance Needed +1  History of Present Illness Patient is an 84 y/o female admitted for R THA.  PMH positive for CAD, CABG, HTN, HLD, peripheral neuropathy.  Precautions  Precautions Fall  Pain Assessment  Pain Assessment Faces  Pain Score 2  Pain Location R hip  Pain Descriptors / Indicators Tightness  Pain Intervention(s) Monitored during session;Repositioned  Cognition  Arousal/Alertness Awake/alert  Behavior During Therapy WFL for tasks assessed/performed  Overall Cognitive Status Within Functional Limits for tasks assessed  Bed Mobility  General bed mobility comments up in chair  Transfers  Overall transfer level Needs assistance  Equipment used Rolling walker (2 wheeled)  Transfers Sit to/from Stand  Sit to Stand Supervision  General transfer comment increased time, cues for hand placement  Ambulation/Gait  Ambulation/Gait assistance Min guard  Gait Distance (Feet) 100 Feet  Assistive device Rolling walker (2 wheeled)  Gait Pattern/deviations Step-to pattern;Step-through pattern;Decreased stride length;Shuffle;Trunk flexed  General Gait Details no pauses, some difficulty progressing R LE at times, but imiproved with ambulation  Balance  Overall balance assessment Needs assistance  Standing balance-Leahy Scale Fair  Exercises  Exercises Total Joint  Total Joint Exercises  Long Arc Quad AROM;Strengthening;Right  PT - End of Session  Equipment Utilized During Treatment Gait belt  Activity Tolerance Patient tolerated treatment well  Patient left in chair;with call bell/phone within reach;with family/visitor present   PT - Assessment/Plan  PT Plan Current plan remains appropriate  PT Visit Diagnosis Difficulty in walking, not elsewhere classified (R26.2);Pain  Pain - Right/Left Right  Pain - part of body Hip  PT Frequency (ACUTE ONLY) 7X/week  Follow Up Recommendations  Follow surgeon's recommendation for DC plan and follow-up therapies  PT equipment None recommended by PT  AM-PAC PT "6 Clicks" Daily Activity Outcome Measure  Difficulty turning over in bed (including adjusting bedclothes, sheets and blankets)? 2  Difficulty moving from lying on back to sitting on the side of the bed?  3  Difficulty sitting down on and standing up from a chair with arms (e.g., wheelchair, bedside commode, etc,.)? 3  Help needed moving to and from a bed to chair (including a wheelchair)? 3  Help needed walking in hospital room? 3  Help needed climbing 3-5 steps with a railing?  2  6 Click Score 16  Mobility G Code  CK  PT Goal Progression  Progress towards PT goals Progressing toward goals  Acute Rehab PT Goals  PT Goal Formulation With patient  Potential to Achieve Goals Good  PT Time Calculation  PT Start Time (ACUTE ONLY) 1433  PT Stop Time (ACUTE ONLY) 1458  PT Time Calculation (min) (ACUTE ONLY) 25 min  PT General Charges  $$ ACUTE PT VISIT 1 Visit  PT Treatments  $Gait Training 8-22 mins  $Therapeutic Exercise 8-22 mins   [place], [place] [phone] 01/29/2018

## 2018-01-29 NOTE — Progress Notes (Signed)
Physical Therapy Treatment Patient Details Name: Tara Potts MRN: [id number] DOB: [birth year] Today's Date: 01/29/2018    History of Present Illness Patient is an 84 y/o female admitted for R THA.  PMH positive for CAD, CABG, HTN, HLD, peripheral neuropathy.    PT Comments    Patient progressing with mobility, able to walk in hallway, though still little light headed, but feels may be due to "hot flashes".  Will need follow up PT at d/c (as arranged by MD, but recommend HHPT).   Follow Up Recommendations  Follow surgeon's recommendation for DC plan and follow-up therapies     Equipment Recommendations  None recommended by PT    Recommendations for Other Services       Precautions / Restrictions Precautions Precautions: Fall Restrictions Weight Bearing Restrictions: No    Mobility  Bed Mobility Overal bed mobility: Needs Assistance       Supine to sit: Min assist;HOB elevated     General bed mobility comments: for R LE  Transfers Overall transfer level: Needs assistance Equipment used: Rolling walker (2 wheeled) Transfers: Sit to/from Stand Sit to Stand: Min assist         General transfer comment: increased time, cues for hand placement  Ambulation/Gait Ambulation/Gait assistance: Min guard Gait Distance (Feet): 70 Feet(x 2) Assistive device: Rolling walker (2 wheeled) Gait Pattern/deviations: Step-to pattern;Step-through pattern;Decreased stride length;Shuffle;Trunk flexed     General Gait Details: cues for posture, for sequence, for increasing distance; c/o hot flash sat in hall then walked back to room   Stairs             Wheelchair Mobility    Modified Rankin (Stroke Patients Only)       Balance Overall balance assessment: Needs assistance   Sitting balance-Leahy Scale: Good       Standing balance-Leahy Scale: Fair Standing balance comment: stood with S during walker adjustments                             Cognition Arousal/Alertness: Awake/alert Behavior During Therapy: WFL for tasks assessed/performed Overall Cognitive Status: Within Functional Limits for tasks assessed                                        Exercises Total Joint Exercises Ankle Circles/Pumps: AROM;10 reps;Both;Supine Quad Sets: AROM;10 reps;Both;Seated Short Arc Quad: AROM;Right;Supine;10 reps Heel Slides: AAROM;Both;Supine;10 reps Hip ABduction/ADduction: 10 reps;AROM;AAROM;Right;Supine    General Comments General comments (skin integrity, edema, etc.): daughter and grandaughter in room; discussing level of care for pt upon d/c      Pertinent Vitals/Pain Pain Assessment: Faces Faces Pain Scale: Hurts a little bit Pain Location: R hip Pain Descriptors / Indicators: Grimacing Pain Intervention(s): Repositioned;Monitored during session    Home Living Family/patient expects to be discharged to:: Private residence Living Arrangements: Children Available Help at Discharge: Family Type of Home: House Home Access: Level entry(in garage)   Home Layout: Multi-level Home Equipment: Environmental consultant - 2 wheels;Cane - single point;Bedside commode      Prior Function            PT Goals (current goals can now be found in the care plan section) Progress towards PT goals: Progressing toward goals    Frequency    7X/week      PT Plan Current plan remains appropriate    Co-evaluation  AM-PAC PT "6 Clicks" Daily Activity  Outcome Measure  Difficulty turning over in bed (including adjusting bedclothes, sheets and blankets)?: Unable Difficulty moving from lying on back to sitting on the side of the bed? : A Lot Difficulty sitting down on and standing up from a chair with arms (e.g., wheelchair, bedside commode, etc,.)?: A Little Help needed moving to and from a bed to chair (including a wheelchair)?: A Little Help needed walking in hospital room?: A Little Help needed climbing 3-5  steps with a railing? : A Lot 6 Click Score: 14    End of Session Equipment Utilized During Treatment: Gait belt Activity Tolerance: Patient tolerated treatment well(some limitations from "hot flash" and light headed) Patient left: in chair;with call bell/phone within reach;with family/visitor present   PT Visit Diagnosis: Difficulty in walking, not elsewhere classified (R26.2);Pain Pain - Right/Left: Right Pain - part of body: Hip     Time: 5277-8242 PT Time Calculation (min) (ACUTE ONLY): 34 min  Charges:  $Gait Training: 8-22 mins $Therapeutic Exercise: 8-22 mins                    G CodesMagda Kiel, [place] [phone] 01/29/2018    Reginia Naas 01/29/2018, 1:28 PM

## 2018-01-29 NOTE — Progress Notes (Signed)
Discharge paperwork discussed with patient and family at the bedside.  They demonstrated understanding and questions were answered.  Pt was escorted by wheelchair in stable condition to main lobby.

## 2018-02-03 NOTE — Discharge Summary (Signed)
Physician Discharge Summary  Patient ID: Tara Potts MRN: [id number] DOB/AGE: 16-Jan-1934 84 y.o.  Admit date: 01/28/2018 Discharge date: 01/29/2018   Procedures:  Procedure(s) (LRB): RIGHT TOTAL HIP ARTHROPLASTY ANTERIOR APPROACH (Right)  Attending Physician:  Dr. Paralee Cancel   Admission Diagnoses:   Right hip primary OA / pain  Discharge Diagnoses:  Principal Problem:   S/P right THA, AA Active Problems:   S/P hip replacement   Obese  Past Medical History:  Diagnosis Date  . Anemia   . Arthritis   . Asthma    as a child  . CAD (coronary artery disease)   . Carotid disease, bilateral (HCC)    mild  . Fibroid   . Hx of CABG 2007   [facility]  . Hyperlipemia   . Hypertension   . Menopausal symptoms   . Peripheral neuropathy   . Seizure-like activity ([place])     HPI:    Tara Potts, 84 y.o. female, has a history of pain and functional disability in the right hip(s) due to arthritis and patient has failed non-surgical conservative treatments for greater than 12 weeks to include NSAID's and/or analgesics, corticosteriod injections, use of assistive devices and activity modification.  Onset of symptoms was gradual starting <1 years ago with rapidlly worsening course since that time.The patient noted no past surgery on the right hip(s).  Patient currently rates pain in the right hip at 8 out of 10 with activity. Patient has worsening of pain with activity and weight bearing, trendelenberg gait, pain that interfers with activities of daily living and pain with passive range of motion. Patient has evidence of periarticular osteophytes and joint space narrowing by imaging studies. This condition presents safety issues increasing the risk of falls.  There is no current active infection.   Risks, benefits and expectations were discussed with the patient.  Risks including but not limited to the risk of anesthesia, blood clots, nerve damage, blood vessel damage, failure of the  prosthesis, infection and up to and including death.  Patient understand the risks, benefits and expectations and wishes to proceed with surgery.   PCP: [name], MD   Discharged Condition: good  Hospital Course:  Patient underwent the above stated procedure on 01/28/2018. Patient tolerated the procedure well and brought to the recovery room in good condition and subsequently to the floor.  POD #1 BP: 178/63 ; Pulse: 70 ; Temp: 98.1 F (36.7 C) ; Resp: 16 Patient reports pain as mild, pain controlled.  No events throughout the night.  She knows that her BUN and Creat were elevated prior to surgery.  We have discussed staying well hydrated in the post-op period.  No events throughout the night. Looking forward to getting home and improving her condition.  Ready to be  discharged home.  Dorsiflexion/plantar flexion intact, incision: dressing C/D/I, no cellulitis present and compartment soft.   LABS  Basename    HGB     9.5  HCT     28.1    Discharge Exam: General appearance: alert, cooperative and no distress Extremities: Homans sign is negative, no sign of DVT, no edema, redness or tenderness in the calves or thighs and no ulcers, gangrene or trophic changes  Disposition:  Home with follow up in 2 weeks   Follow-up Information    Paralee Cancel, MD. Schedule an appointment as soon as possible for a visit in 2 weeks.   Specialty:  Orthopedic Surgery Contact information: [address]  [place] [address] [phone]           Discharge Instructions    Call MD / Call 911   Complete by:  As directed    If you experience chest pain or shortness of breath, CALL 911 and be transported to the hospital emergency room.  If you develope a fever above 101 F, pus (white drainage) or increased drainage or redness at the wound, or calf pain, call your surgeon's office.   Change dressing   Complete by:  As directed    Maintain surgical dressing until follow up in the  clinic. If the edges start to pull up, may reinforce with tape. If the dressing is no longer working, may remove and cover with gauze and tape, but must keep the area dry and clean.  Call with any questions or concerns.   Constipation Prevention   Complete by:  As directed    Drink plenty of fluids.  Prune juice may be helpful.  You may use a stool softener, such as Colace (over the counter) 100 mg twice a day.  Use MiraLax (over the counter) for constipation as needed.   Diet - low sodium heart healthy   Complete by:  As directed    Discharge instructions   Complete by:  As directed    Maintain surgical dressing until follow up in the clinic. If the edges start to pull up, may reinforce with tape. If the dressing is no longer working, may remove and cover with gauze and tape, but must keep the area dry and clean.  Follow up in 2 weeks at [facility]. Call with any questions or concerns.   Increase activity slowly as tolerated   Complete by:  As directed    Weight bearing as tolerated with assist device (walker, cane, etc) as directed, use it as long as suggested by your surgeon or therapist, typically at least 4-6 weeks.   TED hose   Complete by:  As directed    Use stockings (TED hose) for 2 weeks on both leg(s).  You may remove them at night for sleeping.      Allergies as of 01/29/2018      Reactions   Cymbalta [duloxetine Hcl] Nausea Only      Medication List    STOP taking these medications   aspirin 325 MG tablet   aspirin 81 MG tablet Replaced by:  aspirin 81 MG chewable tablet   estradiol-levonorgestrel 0.045-0.015 MG/DAY Commonly known as:  CLIMARAPRO   estradiol-norethindrone 0.05-0.14 MG/DAY Commonly known as:  COMBIPATCH     TAKE these medications   acetaminophen 500 MG tablet Commonly known as:  TYLENOL Take 2 tablets (1,000 mg total) by mouth every 8 (eight) hours.   aspirin 81 MG chewable tablet Commonly known as:  ASPIRIN CHILDRENS Chew 1  tablet (81 mg total) by mouth 2 (two) times daily. Take for 4 weeks, then resume regular dose. Replaces:  aspirin 81 MG tablet   atorvastatin 80 MG tablet Commonly known as:  LIPITOR Take 80 mg by mouth daily.   CALCIUM MAGNESIUM PO Take 1 tablet by mouth 2 (two) times daily.   COQ10 PO Take 1 capsule by mouth daily.   docusate sodium 100 MG capsule Commonly known as:  COLACE Take 1 capsule (100 mg total) by mouth 2 (two) times daily.   ferrous sulfate 325 (65 FE) MG tablet Commonly known as:  FERROUSUL Take 1 tablet (325 mg total) by mouth 3 (three) times daily  with meals.   furosemide 40 MG tablet Commonly known as:  LASIX Take 40 mg by mouth daily.   GARLIC PO Take 1 tablet by mouth 2 (two) times daily.   GLUCOSAMINE PO Take 1 tablet by mouth daily.   hydrocortisone cream 1 % Apply 1 application topically daily as needed for itching (pain).   losartan-hydrochlorothiazide 50-12.5 MG tablet Commonly known as:  HYZAAR Take 1 tablet by mouth daily.   Magnesium 250 MG Tabs Take 250 mg by mouth daily.   methocarbamol 500 MG tablet Commonly known as:  ROBAXIN Take 1 tablet (500 mg total) by mouth every 6 (six) hours as needed for muscle spasms.   metoprolol tartrate 50 MG tablet Commonly known as:  LOPRESSOR Take 1 tablet (50 mg total) by mouth 2 (two) times daily. What changed:  how much to take   Robins 1 drop under the tongue daily as needed (immune support). Cellyte otc liquid supplement   polyethylene glycol packet Commonly known as:  MIRALAX / GLYCOLAX Take 17 g by mouth 2 (two) times daily.   potassium chloride 10 MEQ tablet Commonly known as:  K-DUR TAKE 1 TABLET(10 MEQ) BY MOUTH DAILY   SYSTANE BALANCE 0.6 % Soln Generic drug:  Propylene Glycol Place 1 drop into both eyes daily as needed (dry eyes).   traMADol 50 MG tablet Commonly known as:  ULTRAM Take 1-2 tablets (50-100 mg total) by mouth every 6 (six) hours as  needed for moderate pain or severe pain.   VITAMIN C PO Take 1 tablet by mouth 2 (two) times daily.   Vitamin D 2000 units tablet Take 2,000 Units by mouth daily.            Discharge Care Instructions  (From admission, onward)        Start     Ordered   01/29/18 0000  Change dressing    Comments:  Maintain surgical dressing until follow up in the clinic. If the edges start to pull up, may reinforce with tape. If the dressing is no longer working, may remove and cover with gauze and tape, but must keep the area dry and clean.  Call with any questions or concerns.   01/29/18 8032       Signed: West Pugh. Nahmir Zeidman   PA-C  02/03/2018, 2:12 PM

## 2018-02-20 ENCOUNTER — Encounter: Payer: Self-pay | Admitting: Gynecology

## 2018-02-20 DIAGNOSIS — N6002 Solitary cyst of left breast: Secondary | ICD-10-CM | POA: Diagnosis not present

## 2018-03-06 ENCOUNTER — Encounter: Payer: Self-pay | Admitting: Cardiovascular Disease

## 2018-03-06 DIAGNOSIS — R55 Syncope and collapse: Secondary | ICD-10-CM | POA: Diagnosis not present

## 2018-03-06 DIAGNOSIS — I10 Essential (primary) hypertension: Secondary | ICD-10-CM | POA: Diagnosis not present
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
## 2018-03-11 ENCOUNTER — Encounter: Payer: Self-pay | Admitting: Nurse Practitioner

## 2018-03-11 ENCOUNTER — Ambulatory Visit: Payer: Medicare Other | Admitting: Nurse Practitioner

## 2018-03-11 ENCOUNTER — Telehealth: Payer: Self-pay | Admitting: *Deleted

## 2018-03-11 NOTE — Telephone Encounter (Signed)
NOTES ON FILE FROM DR. WALTER [facility] [phone].

## 2018-03-11 NOTE — Telephone Encounter (Signed)
Patient was no show for follow up with NP today.  

## 2018-03-12 DIAGNOSIS — M1611 Unilateral primary osteoarthritis, right hip: Secondary | ICD-10-CM | POA: Diagnosis not present

## 2018-03-13 ENCOUNTER — Other Ambulatory Visit: Payer: Self-pay | Admitting: Internal Medicine

## 2018-03-13 ENCOUNTER — Encounter (INDEPENDENT_AMBULATORY_CARE_PROVIDER_SITE_OTHER): Payer: Medicare Other

## 2018-03-13 DIAGNOSIS — R42 Dizziness and giddiness: Secondary | ICD-10-CM

## 2018-03-13 DIAGNOSIS — R55 Syncope and collapse: Secondary | ICD-10-CM

## 2018-03-27 DIAGNOSIS — R600 Localized edema: Secondary | ICD-10-CM | POA: Diagnosis not present

## 2018-03-27 DIAGNOSIS — D649 Anemia, unspecified: Secondary | ICD-10-CM | POA: Diagnosis not present

## 2018-03-27 DIAGNOSIS — N184 Chronic kidney disease, stage 4 (severe): Secondary | ICD-10-CM | POA: Diagnosis not present

## 2018-03-27 DIAGNOSIS — I10 Essential (primary) hypertension: Secondary | ICD-10-CM | POA: Diagnosis not present
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
## 2018-04-08 ENCOUNTER — Encounter: Payer: Self-pay | Admitting: Nurse Practitioner

## 2018-04-08 ENCOUNTER — Ambulatory Visit (INDEPENDENT_AMBULATORY_CARE_PROVIDER_SITE_OTHER): Payer: Medicare Other | Admitting: Nurse Practitioner

## 2018-04-08 VITALS — BP 169/59 | HR 70 | Ht 66.0 in | Wt 188.2 lb

## 2018-04-08 DIAGNOSIS — M4802 Spinal stenosis, cervical region: Secondary | ICD-10-CM

## 2018-04-08 DIAGNOSIS — R202 Paresthesia of skin: Secondary | ICD-10-CM | POA: Diagnosis not present

## 2018-04-08 DIAGNOSIS — R269 Unspecified abnormalities of gait and mobility: Secondary | ICD-10-CM | POA: Diagnosis not present

## 2018-04-08 DIAGNOSIS — I2510 Atherosclerotic heart disease of native coronary artery without angina pectoris: Secondary | ICD-10-CM | POA: Diagnosis not present

## 2018-04-08 NOTE — Progress Notes (Signed)
I have reviewed and agreed above plan. 

## 2018-04-08 NOTE — Patient Instructions (Signed)
Pt is still recuperating from right hip surgery 01/28/2018  No further seizure events Follow-up yearly

## 2018-04-17 ENCOUNTER — Ambulatory Visit (HOSPITAL_COMMUNITY)
Admission: RE | Admit: 2018-04-17 | Discharge: 2018-04-17 | Disposition: A | Discharge status: Home / Self Care | Payer: Medicare Other | Source: Ambulatory Visit | Attending: Cardiology | Admitting: Cardiology

## 2018-04-17 DIAGNOSIS — I6523 Occlusion and stenosis of bilateral carotid arteries: Secondary | ICD-10-CM | POA: Diagnosis not present

## 2018-04-21 ENCOUNTER — Other Ambulatory Visit: Payer: Self-pay | Admitting: *Deleted

## 2018-04-21 DIAGNOSIS — I6523 Occlusion and stenosis of bilateral carotid arteries: Secondary | ICD-10-CM

## 2018-04-21 NOTE — Progress Notes (Signed)
VAS 

## 2018-04-23 DIAGNOSIS — M1611 Unilateral primary osteoarthritis, right hip: Secondary | ICD-10-CM | POA: Diagnosis not present

## 2018-04-23 DIAGNOSIS — Z471 Aftercare following joint replacement surgery: Secondary | ICD-10-CM | POA: Diagnosis not present

## 2018-04-23 DIAGNOSIS — Z96641 Presence of right artificial hip joint: Secondary | ICD-10-CM | POA: Diagnosis not present

## 2018-04-29 ENCOUNTER — Telehealth: Payer: Self-pay | Admitting: Cardiovascular Disease

## 2018-04-29 ENCOUNTER — Ambulatory Visit: Payer: Medicare Other | Admitting: Internal Medicine

## 2018-04-29 MED ORDER — METOPROLOL TARTRATE 50 MG PO TABS: 50.0000 mg | ORAL_TABLET | Freq: Two times a day (BID) | ORAL | 3 refills | Status: DC

## 2018-04-29 NOTE — Telephone Encounter (Signed)
Patient states she has always taken Lopressor 75 mg twice a day and her pharmacy will not refill as the Rx states she takes 50 mg BID.   May I refill at 75 mg BID?

## 2018-04-29 NOTE — Telephone Encounter (Signed)
I have reviewed our records on her and do not see that she was increased to 75 mg BID. She will need to be seen if she needs higher dose with EKG. For now, only refill the 50 mg BID dose.   Thank you!

## 2018-04-29 NOTE — Telephone Encounter (Signed)
01/31/16 OV Dr.Berry listed lopressor at 75 mg BID   Patient declines OV/EKG, asks that I refill lopressor 50 mg BID to [facility]

## 2018-04-29 NOTE — Telephone Encounter (Signed)
New Message:    Patient calling there is some confusion about some medication refill. Patient would lie some one to call her pharmacy. [facility] on [address].

## 2018-05-05 DIAGNOSIS — N179 Acute kidney failure, unspecified: Secondary | ICD-10-CM | POA: Diagnosis not present

## 2018-05-05 DIAGNOSIS — D472 Monoclonal gammopathy: Secondary | ICD-10-CM | POA: Diagnosis not present

## 2018-05-05 DIAGNOSIS — R8271 Bacteriuria: Secondary | ICD-10-CM | POA: Diagnosis not present

## 2018-05-05 DIAGNOSIS — D631 Anemia in chronic kidney disease: Secondary | ICD-10-CM | POA: Diagnosis not present

## 2018-05-05 DIAGNOSIS — N184 Chronic kidney disease, stage 4 (severe): Secondary | ICD-10-CM | POA: Diagnosis not present

## 2018-05-05 DIAGNOSIS — N189 Chronic kidney disease, unspecified: Secondary | ICD-10-CM | POA: Diagnosis not present

## 2018-05-05 DIAGNOSIS — I129 Hypertensive chronic kidney disease with stage 1 through stage 4 chronic kidney disease, or unspecified chronic kidney disease: Secondary | ICD-10-CM | POA: Diagnosis not present

## 2018-05-06 ENCOUNTER — Inpatient Hospital Stay: Payer: Medicare Other

## 2018-05-06 ENCOUNTER — Telehealth: Payer: Self-pay | Admitting: Internal Medicine

## 2018-05-06 ENCOUNTER — Inpatient Hospital Stay: Payer: Medicare Other | Attending: Internal Medicine | Admitting: Internal Medicine

## 2018-05-06 ENCOUNTER — Encounter: Payer: Self-pay | Admitting: Internal Medicine

## 2018-05-06 VITALS — BP 184/70 | HR 79 | Temp 98.6°F | Resp 18 | Ht 66.0 in | Wt 188.6 lb

## 2018-05-06 DIAGNOSIS — D472 Monoclonal gammopathy: Secondary | ICD-10-CM

## 2018-05-06 DIAGNOSIS — I10 Essential (primary) hypertension: Secondary | ICD-10-CM

## 2018-05-06 LAB — CMP (CANCER CENTER ONLY)
ALBUMIN: 3.8 g/dL (ref 3.5–5.0)
ALT: 21 U/L (ref 0–44)
AST: 30 U/L (ref 15–41)
Alkaline Phosphatase: 98 U/L (ref 38–126)
Anion gap: 10 (ref 5–15)
BUN: 21 mg/dL (ref 8–23)
CO2: 27 mmol/L (ref 22–32)
CREATININE: 1.39 mg/dL — AB (ref 0.44–1.00)
Calcium: 9.5 mg/dL (ref 8.9–10.3)
Chloride: 104 mmol/L (ref 98–111)
GFR, EST NON AFRICAN AMERICAN: 34 mL/min — AB (ref >60)
GFR, Est AFR Am: 40 mL/min — ABNORMAL LOW (ref >60)
GLUCOSE: 88 mg/dL (ref 70–99)
POTASSIUM: 4.6 mmol/L (ref 3.5–5.1)
Sodium: 141 mmol/L (ref 135–145)
Total Bilirubin: 0.6 mg/dL (ref 0.3–1.2)
Total Protein: 8.2 g/dL — ABNORMAL HIGH (ref 6.5–8.1)

## 2018-05-06 LAB — CBC WITH DIFFERENTIAL (CANCER CENTER ONLY)
ABS IMMATURE GRANULOCYTES: 0.01 10*3/uL (ref 0.00–0.07)
Basophils Absolute: 0 10*3/uL (ref 0.0–0.1)
Basophils Relative: 0 %
EOS PCT: 6 %
Eosinophils Absolute: 0.4 10*3/uL (ref 0.0–0.5)
HCT: 32.2 % — ABNORMAL LOW (ref 36.0–46.0)
HEMOGLOBIN: 10 g/dL — AB (ref 12.0–15.0)
Immature Granulocytes: 0 %
Lymphocytes Relative: 38 %
Lymphs Abs: 2.2 10*3/uL (ref 0.7–4.0)
MCH: 26.5 pg (ref 26.0–34.0)
MCHC: 31.1 g/dL (ref 30.0–36.0)
MCV: 85.2 fL (ref 80.0–100.0)
MONO ABS: 0.7 10*3/uL (ref 0.1–1.0)
MONOS PCT: 12 %
NEUTROS ABS: 2.4 10*3/uL (ref 1.7–7.7)
Neutrophils Relative %: 44 %
Platelet Count: 236 10*3/uL (ref 150–400)
RBC: 3.78 MIL/uL — AB (ref 3.87–5.11)
RDW: 17.9 % — ABNORMAL HIGH (ref 11.5–15.5)
WBC: 5.7 10*3/uL (ref 4.0–10.5)
nRBC: 0 % (ref 0.0–0.2)

## 2018-05-06 LAB — LACTATE DEHYDROGENASE: LDH: 223 U/L — ABNORMAL HIGH (ref 98–192)

## 2018-05-06 NOTE — Telephone Encounter (Signed)
LVM for pt regarding upcoming appts per 10/22 sch message

## 2018-05-06 NOTE — Progress Notes (Signed)
[facility] [phone]   Fax:([phone]) [place], [place] [address]  DIAGNOSIS: Monoclonal gammopathy of undetermined significance.  PRIOR THERAPY: None  CURRENT THERAPY: None  INTERVAL HISTORY: Tara Potts 84 y.o. female returns to the clinic today for follow-up visit.  The patient was last seen in December 2018.  She had suspicious finding for multiple myeloma.  I ordered a bone marrow biopsy and aspirate but the patient did not show up for her appointment for the biopsy or follow-up appointment since that time.  She was recently seen by her primary care physician and noticed to have further worsening of her anemia as well as renal function.  He referred her back to me today for evaluation and recommendation regarding her condition.  She continues to do fine except for fatigue.  She denied having any chest pain, shortness of breath, cough or hemoptysis.  She denied having any fever or chills.  She has no nausea, vomiting, diarrhea or constipation.  She has no significant weight loss or night sweats.  MEDICAL HISTORY: Past Medical History:  Diagnosis Date  . Anemia   . Arthritis   . Asthma    as a child  . CAD (coronary artery disease)   . Carotid disease, bilateral (HCC)    mild  . Fibroid   . Hx of CABG 2007   [facility]  . Hyperlipemia   . Hypertension   . Menopausal symptoms   . Peripheral neuropathy   . Seizure-like activity (HCC)     ALLERGIES:  is allergic to cymbalta [duloxetine hcl].  MEDICATIONS:  Current Outpatient Medications  Medication Sig Dispense Refill  . Ascorbic Acid (VITAMIN C PO) Take 1 tablet by mouth 2 (two) times daily.     [name] atorvastatin (LIPITOR) 80 MG tablet Take 80 mg by mouth daily.    . Calcium-Magnesium-Vitamin D (CALCIUM MAGNESIUM PO) Take 1 tablet by mouth 2 (two) times daily.     . Cholecalciferol (VITAMIN D) 2000 units tablet  Take 2,000 Units by mouth daily.    . Coenzyme Q10 (COQ10 PO) Take 1 capsule by mouth daily.    . furosemide (LASIX) 40 MG tablet Take 40 mg by mouth daily.   6  . GARLIC PO Take 1 tablet by mouth 2 (two) times daily.     . Glucosamine HCl (GLUCOSAMINE PO) Take 1 tablet by mouth daily.    [name] losartan-hydrochlorothiazide (HYZAAR) 50-12.5 MG tablet Take 1 tablet by mouth daily.  2  . Magnesium 250 MG TABS Take 250 mg by mouth daily.    . metoprolol tartrate (LOPRESSOR) 50 MG tablet Take 1 tablet (50 mg total) by mouth 2 (two) times daily. 180 tablet 3  . OVER THE COUNTER MEDICATION Place 1 drop under the tongue daily as needed (immune support). Cellyte otc liquid supplement    . potassium chloride (K-DUR) 10 MEQ tablet TAKE 1 TABLET(10 MEQ) BY MOUTH DAILY 90 tablet 3  . Propylene Glycol (SYSTANE BALANCE) 0.6 % SOLN Place 1 drop into both eyes daily as needed (dry eyes).    . traMADol (ULTRAM) 50 MG tablet Take 1-2 tablets (50-100 mg total) by mouth every 6 (six) hours as needed for moderate pain or severe pain. 40 tablet 0   No current facility-administered medications for this visit.     SURGICAL HISTORY:  Past Surgical History:  Procedure Laterality Date  . CARDIAC CATHETERIZATION  01/09/2008   diffusely diseased graft to the PDA & diffuse PDA disease  . CAROTID DOPPLER  08/27/2011   mod. right & nild left ICA stenosis  . CATARACT SURGERY    . CORONARY ARTERY BYPASS GRAFT    . FOOT SURGERY    . JOINT REPLACEMENT     Right hip Dr. Alvan Dame 01-28-18   . MASS REMOVED FROM CERVIX  12/1997   Benign endocervical inclusion cysts  . NM MYOVIEW LTD  01/12/2011   no ischemia  . TOTAL HIP ARTHROPLASTY Right 01/28/2018   Procedure: RIGHT TOTAL HIP ARTHROPLASTY ANTERIOR APPROACH;  Surgeon: Paralee Cancel, MD;  Location: [facility];  Service: Orthopedics;  Laterality: Right;  70 mins  . TRIPLE HEART BYPASS  05/2006   [facility]  . US ECHOCARDIOGRAPHY  10/30/2007   mild MA,MR,TR,AOV mildly  sclerotic,density oted in the prox asc aorta    REVIEW OF SYSTEMS:  Constitutional: positive for fatigue Eyes: negative Ears, nose, mouth, throat, and face: negative Respiratory: negative Cardiovascular: negative Gastrointestinal: negative Genitourinary:negative Integument/breast: negative Hematologic/lymphatic: negative Musculoskeletal:negative Neurological: negative Behavioral/Psych: negative Endocrine: negative Allergic/Immunologic: negative   PHYSICAL EXAMINATION: General appearance: alert, cooperative, fatigued and no distress Head: Normocephalic, without obvious abnormality, atraumatic Neck: no adenopathy, no JVD, supple, symmetrical, trachea midline and thyroid not enlarged, symmetric, no tenderness/mass/nodules Lymph nodes: Cervical, supraclavicular, and axillary nodes normal. Resp: clear to auscultation bilaterally Back: symmetric, no curvature. ROM normal. No CVA tenderness. Cardio: regular rate and rhythm, S1, S2 normal, no murmur, click, rub or gallop GI: soft, non-tender; bowel sounds normal; no masses,  no organomegaly Extremities: extremities normal, atraumatic, no cyanosis or edema Neurologic: Alert and oriented X 3, normal strength and tone. Normal symmetric reflexes. Normal coordination and gait  ECOG PERFORMANCE STATUS: 1 - Symptomatic but completely ambulatory  Blood pressure (!) 184/70, pulse 79, temperature 98.6 F (37 C), temperature source Oral, resp. rate 18, height _0  (1.676 m), weight 188 lb 9.6 oz (85.5 kg), last menstrual period 07/16/2002, SpO2 100 %.  LABORATORY DATA: Lab Results  Component Value Date   WBC 9.3 01/29/2018   HGB 9.5 (L) 01/29/2018   HCT 28.1 (L) 01/29/2018   MCV 77.8 (L) 01/29/2018   PLT 178 01/29/2018      Chemistry      Component Value Date/Time   NA 137 01/29/2018 0554   NA 138 07/03/2017 0928   K 3.5 01/29/2018 0554   K 4.6 07/03/2017 0928   CL 104 01/29/2018 0554   CO2 24 01/29/2018 0554   CO2 29 07/03/2017  0928   BUN 43 (H) 01/29/2018 0554   BUN 25.5 07/03/2017 0928   CREATININE 1.89 (H) 01/29/2018 0554   CREATININE 1.5 (H) 07/03/2017 0928      Component Value Date/Time   CALCIUM 8.2 (L) 01/29/2018 0554   CALCIUM 9.4 07/03/2017 0928   ALKPHOS 72 07/03/2017 0928   AST 25 07/03/2017 0928   ALT 17 07/03/2017 0928   BILITOT 0.51 07/03/2017 0928     Myeloma panel: Beta-2 microglobulin 2.7, IgG 1439, IgA 165, IgM 30, free kappa light chain 222.6, free lambda light chain 13.1 with a kappa/lambda ratio of 16.99  RADIOGRAPHIC STUDIES: No results found.  ASSESSMENT AND PLAN:  This is a very pleasant 84 years old white female presented for evaluation of monoclonal gammopathy.  Her recent myeloma panel showed elevated free kappa light chain.   Her condition is highly suspicious for multiple myeloma.  I had a lengthy discussion with the patient and her  daughter about her current condition and further investigation to confirm diagnosis.  I gave the patient the option of proceeding with repeating the myeloma panel as well as a skeletal bone survey and bone marrow biopsy and aspirate.  She was also given the option of continuous palliative care and no further investigation. The patient agreed to proceed with the investigation to rule out multiple myeloma at this point.  We will arrange for her to have the lab work today.  She will have a skeletal bone survey and bone marrow biopsy and aspirate in the next 1-2 weeks. I will see her back for follow-up visit in 3 weeks for evaluation and discussion of her treatment options based on the pending results. For hypertension she mentions that her blood pressure fluctuate up and down.  She declined to take clonidine today because it dropped her blood pressure significantly last time she was here. She will monitor her blood pressure closely at home and report to her primary care physician for adjustment of her medication if needed. The patient was advised to call  immediately if she has any concerning symptoms in the interval. The patient voices understanding of current disease status and treatment options and is in agreement with the current care plan.  All questions were answered. The patient knows to call the clinic with any problems, questions or concerns. We can certainly see the patient much sooner if necessary.  I spent 15 minutes counseling the patient face to face. The total time spent in the appointment was 25 minutes.  Disclaimer: This note was dictated with voice recognition software. Similar sounding words can inadvertently be transcribed and may not be corrected upon review.

## 2018-05-07 LAB — BETA 2 MICROGLOBULIN, SERUM: Beta-2 Microglobulin: 3 mg/L — ABNORMAL HIGH (ref 0.6–2.4)

## 2018-05-07 LAB — IGG, IGA, IGM
IGA: 115 mg/dL (ref 64–422)
IGM (IMMUNOGLOBULIN M), SRM: 30 mg/dL (ref 26–217)
IgG (Immunoglobin G), Serum: 1631 mg/dL — ABNORMAL HIGH (ref 700–1600)

## 2018-05-08 DIAGNOSIS — I10 Essential (primary) hypertension: Secondary | ICD-10-CM | POA: Diagnosis not present

## 2018-05-08 DIAGNOSIS — R600 Localized edema: Secondary | ICD-10-CM | POA: Diagnosis not present

## 2018-05-08 LAB — KAPPA/LAMBDA LIGHT CHAINS
KAPPA FREE LGHT CHN: 389.6 mg/L — AB (ref 3.3–19.4)
KAPPA, LAMDA LIGHT CHAIN RATIO: 32.47 — AB (ref 0.26–1.65)
LAMDA FREE LIGHT CHAINS: 12 mg/L (ref 5.7–26.3)

## 2018-05-13 ENCOUNTER — Ambulatory Visit (HOSPITAL_COMMUNITY)
Admission: RE | Admit: 2018-05-13 | Discharge: 2018-05-13 | Disposition: A | Discharge status: Home / Self Care | Payer: Medicare Other | Source: Ambulatory Visit | Attending: Internal Medicine | Admitting: Internal Medicine

## 2018-05-13 ENCOUNTER — Telehealth: Payer: Self-pay | Admitting: Adult Health

## 2018-05-13 ENCOUNTER — Inpatient Hospital Stay (HOSPITAL_BASED_OUTPATIENT_CLINIC_OR_DEPARTMENT_OTHER): Payer: Medicare Other | Admitting: Adult Health

## 2018-05-13 ENCOUNTER — Inpatient Hospital Stay: Payer: Medicare Other

## 2018-05-13 VITALS — BP 180/70 | HR 81 | Temp 98.1°F | Resp 16

## 2018-05-13 DIAGNOSIS — D472 Monoclonal gammopathy: Secondary | ICD-10-CM | POA: Diagnosis not present

## 2018-05-13 DIAGNOSIS — D464 Refractory anemia, unspecified: Secondary | ICD-10-CM

## 2018-05-13 DIAGNOSIS — I10 Essential (primary) hypertension: Secondary | ICD-10-CM | POA: Diagnosis not present

## 2018-05-13 DIAGNOSIS — R768 Other specified abnormal immunological findings in serum: Secondary | ICD-10-CM

## 2018-05-13 DIAGNOSIS — D649 Anemia, unspecified: Secondary | ICD-10-CM | POA: Diagnosis not present

## 2018-05-13 DIAGNOSIS — D4989 Neoplasm of unspecified behavior of other specified sites: Secondary | ICD-10-CM | POA: Diagnosis not present

## 2018-05-13 LAB — CBC WITH DIFFERENTIAL (CANCER CENTER ONLY)
Abs Immature Granulocytes: 0.03 10*3/uL (ref 0.00–0.07)
BASOS ABS: 0 10*3/uL (ref 0.0–0.1)
Basophils Relative: 1 %
EOS PCT: 6 %
Eosinophils Absolute: 0.3 10*3/uL (ref 0.0–0.5)
HEMATOCRIT: 31.2 % — AB (ref 36.0–46.0)
HEMOGLOBIN: 9.8 g/dL — AB (ref 12.0–15.0)
Immature Granulocytes: 1 %
LYMPHS ABS: 1.8 10*3/uL (ref 0.7–4.0)
LYMPHS PCT: 33 %
MCH: 26.2 pg (ref 26.0–34.0)
MCHC: 31.4 g/dL (ref 30.0–36.0)
MCV: 83.4 fL (ref 80.0–100.0)
Monocytes Absolute: 0.6 10*3/uL (ref 0.1–1.0)
Monocytes Relative: 11 %
NEUTROS PCT: 48 %
NRBC: 0 % (ref 0.0–0.2)
Neutro Abs: 2.6 10*3/uL (ref 1.7–7.7)
Platelet Count: 234 10*3/uL (ref 150–400)
RBC: 3.74 MIL/uL — AB (ref 3.87–5.11)
RDW: 17.2 % — AB (ref 11.5–15.5)
WBC Count: 5.4 10*3/uL (ref 4.0–10.5)

## 2018-05-13 NOTE — Progress Notes (Signed)
INDICATION: r/o multiple myeloma    Bone Marrow Biopsy and Aspiration Procedure Note   Informed consent was obtained and potential risks including bleeding, infection and pain were reviewed with the patient.  The patient's name, date of birth, identification, consent and allergies were verified prior to the start of procedure and time out was performed.  Patient accompanied by daughter [name].  The left posterior iliac crest was chosen as the site of biopsy.  The skin was prepped with ChloraPrep.   16 cc of 2% lidocaine was used to provide local anaesthesia.   10 cc of bone marrow aspirate was obtained followed by 0.7cm biopsy.  Pressure was applied to the biopsy site and bandage was placed over the biopsy site. Patient was made to lie on the back for 30 mins prior to discharge.  The procedure was tolerated moderately well.  Patient tearful during procedure. COMPLICATIONS: None BLOOD LOSS: none The patient was discharged home in stable condition with a 1 week follow up to review results.  Patient was provided with post bone marrow biopsy instructions and instructed to call if there was any bleeding or worsening pain.  Specimens sent for flow cytometry, cytogenetics and additional studies.  Signed Scot Dock, NP

## 2018-05-13 NOTE — Telephone Encounter (Signed)
No 10/29 los orders.

## 2018-05-13 NOTE — Patient Instructions (Signed)
Bone Marrow Aspiration and Bone Marrow Biopsy, Adult, Care After This sheet gives you information about how to care for yourself after your procedure. Your health care provider may also give you more specific instructions. If you have problems or questions, contact your health care provider. What can I expect after the procedure? After the procedure, it is common to have:  Mild pain and tenderness.  Swelling.  Bruising.  Follow these instructions at home:  Take over-the-counter or prescription medicines only as told by your health care provider.  Do not take baths, swim, or use a hot tub until your health care provider approves. Ask if you can take a shower or have a sponge bath.  Follow instructions from your health care provider about how to take care of the puncture site. Make sure you: ? Wash your hands with soap and water before you change your bandage (dressing). If soap and water are not available, use hand sanitizer. ? Change your dressing as told by your health care provider.  Check your puncture siteevery day for signs of infection. Check for: ? More redness, swelling, or pain. ? More fluid or blood. ? Warmth. ? Pus or a bad smell.  Return to your normal activities as told by your health care provider. Ask your health care provider what activities are safe for you.  Do not drive for 24 hours if you were given a medicine to help you relax (sedative).  Keep all follow-up visits as told by your health care provider. This is important. Contact a health care provider if:  You have more redness, swelling, or pain around the puncture site.  You have more fluid or blood coming from the puncture site.  Your puncture site feels warm to the touch.  You have pus or a bad smell coming from the puncture site.  You have a fever.  Your pain is not controlled with medicine. This information is not intended to replace advice given to you by your health care provider. Make sure  you discuss any questions you have with your health care provider. Document Released: 01/19/2005 Document Revised: 01/20/2016 Document Reviewed: 12/14/2015 Elsevier Interactive Patient Education  2018 [employer].

## 2018-05-21 ENCOUNTER — Encounter (HOSPITAL_COMMUNITY): Payer: Self-pay | Admitting: Internal Medicine

## 2018-05-22 DIAGNOSIS — R29898 Other symptoms and signs involving the musculoskeletal system: Secondary | ICD-10-CM | POA: Insufficient documentation

## 2018-05-22 DIAGNOSIS — G8310 Monoplegia of lower limb affecting unspecified side: Secondary | ICD-10-CM | POA: Diagnosis not present

## 2018-05-23 ENCOUNTER — Other Ambulatory Visit: Payer: Medicare Other

## 2018-05-26 ENCOUNTER — Encounter: Payer: Self-pay | Admitting: Oncology

## 2018-05-26 ENCOUNTER — Telehealth: Payer: Self-pay | Admitting: Oncology

## 2018-05-26 ENCOUNTER — Inpatient Hospital Stay: Payer: Medicare Other | Attending: Internal Medicine | Admitting: Oncology

## 2018-05-26 VITALS — BP 188/53 | HR 65 | Temp 98.3°F | Resp 18 | Ht 66.0 in | Wt 188.8 lb

## 2018-05-26 DIAGNOSIS — N289 Disorder of kidney and ureter, unspecified: Secondary | ICD-10-CM | POA: Diagnosis not present

## 2018-05-26 DIAGNOSIS — D649 Anemia, unspecified: Secondary | ICD-10-CM | POA: Insufficient documentation

## 2018-05-26 DIAGNOSIS — I10 Essential (primary) hypertension: Secondary | ICD-10-CM | POA: Diagnosis not present

## 2018-05-26 DIAGNOSIS — Z7189 Other specified counseling: Secondary | ICD-10-CM | POA: Insufficient documentation

## 2018-05-26 DIAGNOSIS — C9000 Multiple myeloma not having achieved remission: Secondary | ICD-10-CM

## 2018-05-26 DIAGNOSIS — Z79899 Other long term (current) drug therapy: Secondary | ICD-10-CM | POA: Insufficient documentation

## 2018-05-26 HISTORY — DX: Multiple myeloma not having achieved remission: C90.00

## 2018-05-26 NOTE — Telephone Encounter (Signed)
Scheduled appt per 11/11 los - gave patient AVS and calender per los.   

## 2018-05-26 NOTE — Assessment & Plan Note (Signed)
This is a very pleasant 84 year old white female presented for evaluation of monoclonal gammopathy.  Her recent myeloma panel showed elevated free kappa light chain.  She had a repeat myeloma panel, bone scan, and bone marrow biopsy and is here to discuss the results.  The patient was seen with Dr. Julien Nordmann.  We discussed that the results of the bone marrow biopsy do indicate a plasma cell disorder.  This is consistent with multiple myeloma.  We discussed her diagnosis, prognosis, and treatment options.  We discussed with the patient and her daughter that she has anemia and renal insufficiency but has no evidence of bone disease or elevated calcium.  Treatment options presented included continued observation versus proceeding with treatment.  The patient prefers to remain on observation.  She will follow-up in 3 months with a repeat myeloma panel 1 week prior to the visit.  She was given written information about multiple myeloma.   For hypertension, she will monitor her blood pressure closely at home and report to her primary care physician for adjustment of her medication if needed.  The patient was advised to call immediately if she has any concerning symptoms in the interval. The patient voices understanding of current disease status and treatment options and is in agreement with the current care plan.  All questions were answered. The patient knows to call the clinic with any problems, questions or concerns. We can certainly see the patient much sooner if necessary.

## 2018-05-26 NOTE — Patient Instructions (Signed)
Multiple Myeloma  Multiple myeloma is a form of cancer that results from the uncontrolled growth of abnormal plasma cells. Plasma cells are a type of white blood cell produced in the soft tissue inside bones (bone marrow). These are cells in your blood that normally help you fight infection. They are part of your body's defense system (immune system). Plasma cells that become cancerous will grow out of control. As a result, they interfere with normal blood cells and many important functions that normal cells perform in your body. With multiple myeloma, the abnormal plasma cells cause multiple tumors to form.  Multiple myeloma damages your bones and causes other health problems because of its effect on blood cells. The disease progresses and reduces your ability to fight off infections.  What are the causes?  The cause of multiple myeloma is not known.  What increases the risk?  Risk factors include:   Being older than 65.   Being African American.   Having a family history of the disease.    What are the signs or symptoms?  Signs and symptoms of multiple myeloma may include:   Bone pain, especially in the back, ribs, and hips.   Broken bones (fractures).   Low blood counts, including reduced red blood cells (anemia), reduced white blood cells (leukopenia), and reduced platelets.   Fatigue.   Weakness.   Infections.   Bleeding, such as bleeding from the nose or gums, or increased bleeding from a scrape or cut.   High blood calcium levels.   Increased urination.   Confusion.    How is this diagnosed?  Your health care provider will do a physical exam and take your medical history. Tests will be done to help confirm the diagnosis. Tests may include:   Blood tests.   Urine tests.   X-rays.   MRI.   Bone marrow biopsy. In this test, a sample of marrow is removed from one of your bones. The sample is viewed under a microscope to check for abnormal plasma cells.    How is this treated?  There is no cure  for multiple myeloma. Treatment options may vary depending on how much the disease has advanced. Possible treatment options may include:   Medicines that kill cancer cells (chemotherapy).   Medicines that help prevent bone damage (bisphosphonates).   Radiation therapy. High-energy rays are used to kill cancer cells.   Surgery. This may be done to repair damage to bone.   Targeted drug therapy. These medicines block the growth and spread of cancer cells.   Immunotherapy. This is also called biologic therapy. It involves the use of medicines to strengthen the ability of your immune system to fight cancer cells.   Stem cell transplant. Healthy stem cells are infused into your body. These stem cells produce new blood cells to replace those killed by the disease or by other treatments. The healthy cells that are transplanted may be your own or may come from another person.   Plasmapheresis. This is a procedure used to remove plasma cells from your blood.   Other medicines to treat problems such as infections or pain.    Follow these instructions at home:   Take medicines only as directed by your health care provider.   Drink enough fluid to keep your urine clear or pale yellow.   Eat a well-balanced diet. Work with a dietitian to make sure you are getting the nutrition you need.   Take vitamins or dietary supplements as directed   a support group or seeking counseling to help you cope with the stress of having multiple myeloma.  Keep all follow-up visits as directed by your health care provider. This is important. Contact a health care provider if:  Your pain is not controlled with medicine or is getting worse.  You have a  fever.  You have swollen legs.  You have weakness or dizziness.  You have unexplained weight loss.  You have unexplained bleeding or bruising.  You have a cough or cold symptoms.  You feel depressed.  You have changes in urination or bowel movements. Get help right away if:  You have sudden severe pain, especially back pain.  You have numbness or weakness in your arms, hands, legs, or feet.  You have confusion.  You have weakness on one side of your body.  You have slurred speech.  You have trouble staying awake.  You have shortness of breath.  You have blood in your stool or urine.  You vomit or cough up blood. This information is not intended to replace advice given to you by your health care provider. Make sure you discuss any questions you have with your health care provider. Document Released: 03/27/2001 Document Revised: 12/08/2015 Document Reviewed: 02/02/2014 Elsevier Interactive Patient Education  [employer].

## 2018-05-26 NOTE — Progress Notes (Signed)
[place] OFFICE PROGRESS NOTE  [name], MD [place] [address]  DIAGNOSIS:  Multiple myeloma  PRIOR THERAPY: None  CURRENT THERAPY: Observation   INTERVAL HISTORY: Tara Potts 84 y.o. female returns for routine follow-up visit accompanied by her daughter.  The patient is feeling fine today and has no specific complaints.  She denies fevers and chills.  Denies chest pain, shortness of breath, cough, hemoptysis.  Denies nausea, vomiting, constipation, diarrhea.  Denies recent weight loss or night sweats.  The patient had a recent bone marrow biopsy, bone scan, and myeloma panel and is here to discuss the results.  MEDICAL HISTORY: Past Medical History:  Diagnosis Date  . Anemia   . Arthritis   . Asthma    as a child  . CAD (coronary artery disease)   . Carotid disease, bilateral (HCC)    mild  . Fibroid   . Hx of CABG 2007   [facility]  . Hyperlipemia   . Hypertension   . Menopausal symptoms   . Peripheral neuropathy   . Seizure-like activity (HCC)     ALLERGIES:  is allergic to cymbalta [duloxetine hcl].  MEDICATIONS:  Current Outpatient Medications  Medication Sig Dispense Refill  . Ascorbic Acid (VITAMIN C PO) Take 1 tablet by mouth 2 (two) times daily.     [name] atorvastatin (LIPITOR) 80 MG tablet Take 80 mg by mouth daily.    . Calcium-Magnesium-Vitamin D (CALCIUM MAGNESIUM PO) Take 1 tablet by mouth 2 (two) times daily.     . Cholecalciferol (VITAMIN D) 2000 units tablet Take 2,000 Units by mouth daily.    . Coenzyme Q10 (COQ10 PO) Take 1 capsule by mouth daily.    . furosemide (LASIX) 40 MG tablet Take 40 mg by mouth daily.   6  . GARLIC PO Take 1 tablet by mouth 2 (two) times daily.     . Glucosamine HCl (GLUCOSAMINE PO) Take 1 tablet by mouth daily.    [name] losartan-hydrochlorothiazide (HYZAAR) 50-12.5 MG tablet Take 1 tablet by mouth daily.  2  . Magnesium 250 MG TABS Take 250 mg by mouth daily.    .  metoprolol tartrate (LOPRESSOR) 50 MG tablet Take 1 tablet (50 mg total) by mouth 2 (two) times daily. 180 tablet 3  . OVER THE COUNTER MEDICATION Place 1 drop under the tongue daily as needed (immune support). Cellyte otc liquid supplement    . potassium chloride (K-DUR) 10 MEQ tablet TAKE 1 TABLET(10 MEQ) BY MOUTH DAILY 90 tablet 3  . Propylene Glycol (SYSTANE BALANCE) 0.6 % SOLN Place 1 drop into both eyes daily as needed (dry eyes).    . traMADol (ULTRAM) 50 MG tablet Take 1-2 tablets (50-100 mg total) by mouth every 6 (six) hours as needed for moderate pain or severe pain. (Patient not taking: Reported on 05/26/2018) 40 tablet 0   No current facility-administered medications for this visit.     SURGICAL HISTORY:  Past Surgical History:  Procedure Laterality Date  . CARDIAC CATHETERIZATION  01/09/2008   diffusely diseased graft to the PDA & diffuse PDA disease  . CAROTID DOPPLER  08/27/2011   mod. right & nild left ICA stenosis  . CATARACT SURGERY    . CORONARY ARTERY BYPASS GRAFT    . FOOT SURGERY    . JOINT REPLACEMENT     Right hip Dr. Alvan Dame 01-28-18   . MASS REMOVED FROM CERVIX  12/1997   Benign endocervical inclusion  cysts  . NM MYOVIEW LTD  01/12/2011   no ischemia  . TOTAL HIP ARTHROPLASTY Right 01/28/2018   Procedure: RIGHT TOTAL HIP ARTHROPLASTY ANTERIOR APPROACH;  Surgeon: Paralee Cancel, MD;  Location: [facility];  Service: Orthopedics;  Laterality: Right;  70 mins  . TRIPLE HEART BYPASS  05/2006   [facility]  . US ECHOCARDIOGRAPHY  10/30/2007   mild MA,MR,TR,AOV mildly sclerotic,density oted in the prox asc aorta    REVIEW OF SYSTEMS:   Review of Systems  Constitutional: Negative for appetite change, chills, fatigue, fever and unexpected weight change.  HENT:   Negative for mouth sores, nosebleeds, sore throat and trouble swallowing.   Eyes: Negative for eye problems and icterus.  Respiratory: Negative for cough, hemoptysis, shortness of breath and wheezing.    Cardiovascular: Negative for chest pain and leg swelling.  Gastrointestinal: Negative for abdominal pain, constipation, diarrhea, nausea and vomiting.  Genitourinary: Negative for bladder incontinence, difficulty urinating, dysuria, frequency and hematuria.   Musculoskeletal: Negative for back pain, gait problem, neck pain and neck stiffness.  Skin: Negative for itching and rash.  Neurological: Negative for dizziness, extremity weakness, gait problem, headaches, light-headedness and seizures.  Hematological: Negative for adenopathy. Does not bruise/bleed easily.  Psychiatric/Behavioral: Negative for confusion, depression and sleep disturbance. The patient is not nervous/anxious.     PHYSICAL EXAMINATION:  Blood pressure (!) 188/53, pulse 65, temperature 98.3 F (36.8 C), temperature source Oral, resp. rate 18, height '5\' 6"'  (1.676 m), weight 188 lb 12.8 oz (85.6 kg), last menstrual period 07/16/2002, SpO2 100 %.  ECOG PERFORMANCE STATUS: 1 - Symptomatic but completely ambulatory  Physical Exam  Constitutional: Oriented to person, place, and time and well-developed, well-nourished, and in no distress. No distress.  HENT:  Head: Normocephalic and atraumatic.  Mouth/Throat: Oropharynx is clear and moist. No oropharyngeal exudate.  Eyes: Conjunctivae are normal. Right eye exhibits no discharge. Left eye exhibits no discharge. No scleral icterus.  Neck: Normal range of motion. Neck supple.  Cardiovascular: Normal rate, regular rhythm, normal heart sounds and intact distal pulses.   Pulmonary/Chest: Effort normal and breath sounds normal. No respiratory distress. No wheezes. No rales.  Abdominal: Soft. Bowel sounds are normal. Exhibits no distension and no mass. There is no tenderness.  Musculoskeletal: Normal range of motion. Exhibits no edema.  Lymphadenopathy:    No cervical adenopathy.  Neurological: Alert and oriented to person, place, and time. Exhibits normal muscle tone. Gait normal.  Coordination normal.  Skin: Skin is warm and dry. No rash noted. Not diaphoretic. No erythema. No pallor.  Psychiatric: Mood, memory and judgment normal.  Vitals reviewed.  LABORATORY DATA: Lab Results  Component Value Date   WBC 5.4 05/13/2018   HGB 9.8 (L) 05/13/2018   HCT 31.2 (L) 05/13/2018   MCV 83.4 05/13/2018   PLT 234 05/13/2018      Chemistry      Component Value Date/Time   NA 141 05/06/2018 1245   NA 138 07/03/2017 0928   K 4.6 05/06/2018 1245   K 4.6 07/03/2017 0928   CL 104 05/06/2018 1245   CO2 27 05/06/2018 1245   CO2 29 07/03/2017 0928   BUN 21 05/06/2018 1245   BUN 25.5 07/03/2017 0928   CREATININE 1.39 (H) 05/06/2018 1245   CREATININE 1.5 (H) 07/03/2017 0928      Component Value Date/Time   CALCIUM 9.5 05/06/2018 1245   CALCIUM 9.4 07/03/2017 0928   ALKPHOS 98 05/06/2018 1245   ALKPHOS 72 07/03/2017 0928  AST 30 05/06/2018 1245   AST 25 07/03/2017 0928   ALT 21 05/06/2018 1245   ALT 17 07/03/2017 0928   BILITOT 0.6 05/06/2018 1245   BILITOT 0.51 07/03/2017 0928       RADIOGRAPHIC STUDIES:  Dg Bone Survey Met  Result Date: 05/13/2018 CLINICAL DATA:  Monoclonal gammopathy of unknown significance. EXAM: METASTATIC BONE SURVEY COMPARISON:  None. FINDINGS: Multilevel degenerative disc disease is noted in lower cervical spine as well as in lower lumbar spine. Status post right total hip arthroplasty. No definite evidence of abnormal lucency or lytic destruction is noted. No other significant abnormality is seen involving the visualized skull, spine, rib cage, pelvis or extremities. IMPRESSION: No abnormal lucency or lytic destruction is seen in the skeleton. Electronically Signed   By: Marijo Conception, M.D.   On: 05/13/2018 16:41   PATHOLOGY:  Diagnosis Bone Marrow, Aspirate,Biopsy, and Clot BONE MARROW: - CELLULAR MARROW INVOLVED BY PLASMA CELL NEOPLASM (30-40%) - SEE COMMENT PERIPHERAL BLOOD: - NORMOCYTIC ANEMIA - SEE COMPLETE BLOOD  COUNT Diagnosis Note The overall features in the marrow are consistent with a plasma cell myeloma. Correlation with FISH and cytogenetics is recommended.  ASSESSMENT/PLAN:  Multiple myeloma (HCC) This is a very pleasant 84 year old white female presented for evaluation of monoclonal gammopathy.  Her recent myeloma panel showed elevated free kappa light chain.  She had a repeat myeloma panel, bone scan, and bone marrow biopsy and is here to discuss the results.  The patient was seen with Dr. Julien Nordmann.  We discussed that the results of the bone marrow biopsy do indicate a plasma cell disorder.  This is consistent with multiple myeloma.  We discussed her diagnosis, prognosis, and treatment options.  We discussed with the patient and her daughter that she has anemia and renal insufficiency but has no evidence of bone disease or elevated calcium.  Treatment options presented included continued observation versus proceeding with treatment.  The patient prefers to remain on observation.  She will follow-up in 3 months with a repeat myeloma panel 1 week prior to the visit.  She was given written information about multiple myeloma.   For hypertension, she will monitor her blood pressure closely at home and report to her primary care physician for adjustment of her medication if needed.  The patient was advised to call immediately if she has any concerning symptoms in the interval. The patient voices understanding of current disease status and treatment options and is in agreement with the current care plan.  All questions were answered. The patient knows to call the clinic with any problems, questions or concerns. We can certainly see the patient much sooner if necessary.   Orders Placed This Encounter  Procedures  . CBC with Differential (Cancer Center Only)    Standing Status:   Future    Standing Expiration Date:   05/27/2019  . CMP ([place] only)    Standing Status:   Future    Standing  Expiration Date:   05/27/2019  . Beta 2 microglobulin, serum    Standing Status:   Future    Standing Expiration Date:   05/26/2019  . Kappa/lambda light chains    Standing Status:   Future    Standing Expiration Date:   05/26/2019  . IgG, IgA, IgM    Standing Status:   Future    Standing Expiration Date:   05/26/2019  . Lactate dehydrogenase    Standing Status:   Future    Standing Expiration  Date:   05/27/2019     Mikey Bussing, DNP, AGPCNP-BC, AOCNP 05/26/18   ADDENDUM: Hematology/Oncology Attending: I had a face-to-face encounter with the patient today.  I recommended her care plan.  This is a very pleasant 84 years old African-American female with history of monoclonal gammopathy and recent diagnosis of multiple myeloma.  The patient had several studies performed recently including repeat myeloma panel in addition to bone marrow biopsy and aspirate that showed 30-40% plasma cells in the bone marrow.  Consistent with plasma cell neoplasm.  She also had a skeletal bone survey that showed no concerning lytic lesions.  I had a lengthy discussion with the patient and her daughter today about her current condition and treatment options.  I gave the patient the option of starting treatment with systemic therapy with Velcade, Revlimid and Decadron versus continuous observation and close monitoring.  The patient is not interested in proceeding with any treatment at this point and she would like to continue on observation. I will see her back for follow-up visit in 3 months for evaluation with repeat myeloma panel. The patient was advised to call immediately if she has any concerning symptoms in the interval.  Disclaimer: This note was dictated with voice recognition software. Similar sounding words can inadvertently be transcribed and may be missed upon review. Eilleen Kempf, MD 05/26/18

## 2018-06-09 DIAGNOSIS — G8310 Monoplegia of lower limb affecting unspecified side: Secondary | ICD-10-CM | POA: Diagnosis not present

## 2018-06-10 DIAGNOSIS — N390 Urinary tract infection, site not specified: Secondary | ICD-10-CM | POA: Diagnosis not present

## 2018-06-10 DIAGNOSIS — E7800 Pure hypercholesterolemia, unspecified: Secondary | ICD-10-CM | POA: Diagnosis not present

## 2018-06-10 DIAGNOSIS — Z7982 Long term (current) use of aspirin: Secondary | ICD-10-CM | POA: Diagnosis not present

## 2018-06-10 DIAGNOSIS — I10 Essential (primary) hypertension: Secondary | ICD-10-CM | POA: Diagnosis not present

## 2018-06-16 DIAGNOSIS — E559 Vitamin D deficiency, unspecified: Secondary | ICD-10-CM | POA: Diagnosis not present

## 2018-06-16 DIAGNOSIS — G4762 Sleep related leg cramps: Secondary | ICD-10-CM | POA: Diagnosis not present

## 2018-06-16 DIAGNOSIS — B351 Tinea unguium: Secondary | ICD-10-CM | POA: Diagnosis not present

## 2018-06-16 DIAGNOSIS — I2510 Atherosclerotic heart disease of native coronary artery without angina pectoris: Secondary | ICD-10-CM | POA: Diagnosis not present

## 2018-06-16 DIAGNOSIS — I739 Peripheral vascular disease, unspecified: Secondary | ICD-10-CM | POA: Diagnosis not present

## 2018-06-16 DIAGNOSIS — I10 Essential (primary) hypertension: Secondary | ICD-10-CM | POA: Diagnosis not present

## 2018-06-16 DIAGNOSIS — E782 Mixed hyperlipidemia: Secondary | ICD-10-CM | POA: Diagnosis not present

## 2018-06-16 DIAGNOSIS — D649 Anemia, unspecified: Secondary | ICD-10-CM | POA: Diagnosis not present

## 2018-06-16 DIAGNOSIS — R55 Syncope and collapse: Secondary | ICD-10-CM | POA: Diagnosis not present

## 2018-06-16 DIAGNOSIS — Z0000 Encounter for general adult medical examination without abnormal findings: Secondary | ICD-10-CM | POA: Diagnosis not present

## 2018-06-16 DIAGNOSIS — N183 Chronic kidney disease, stage 3 (moderate): Secondary | ICD-10-CM | POA: Diagnosis not present

## 2018-06-16 DIAGNOSIS — M4802 Spinal stenosis, cervical region: Secondary | ICD-10-CM | POA: Diagnosis not present

## 2018-06-23 DIAGNOSIS — G8310 Monoplegia of lower limb affecting unspecified side: Secondary | ICD-10-CM | POA: Diagnosis not present

## 2018-06-24 ENCOUNTER — Ambulatory Visit (INDEPENDENT_AMBULATORY_CARE_PROVIDER_SITE_OTHER): Payer: Medicare Other | Admitting: Cardiovascular Disease

## 2018-06-24 ENCOUNTER — Encounter: Payer: Self-pay | Admitting: Cardiovascular Disease

## 2018-06-24 DIAGNOSIS — I10 Essential (primary) hypertension: Secondary | ICD-10-CM

## 2018-06-24 DIAGNOSIS — E7800 Pure hypercholesterolemia, unspecified: Secondary | ICD-10-CM

## 2018-06-24 DIAGNOSIS — I739 Peripheral vascular disease, unspecified: Secondary | ICD-10-CM | POA: Diagnosis not present

## 2018-06-24 DIAGNOSIS — I6523 Occlusion and stenosis of bilateral carotid arteries: Secondary | ICD-10-CM | POA: Diagnosis not present

## 2018-06-24 DIAGNOSIS — I2510 Atherosclerotic heart disease of native coronary artery without angina pectoris: Secondary | ICD-10-CM | POA: Diagnosis not present

## 2018-06-24 LAB — LIPID PANEL
CHOL/HDL RATIO: 3.9 ratio (ref 0.0–4.4)
Cholesterol, Total: 214 mg/dL — ABNORMAL HIGH (ref 100–199)
HDL: 55 mg/dL (ref >39)
LDL Calculated: 140 mg/dL — ABNORMAL HIGH (ref 0–99)
TRIGLYCERIDES: 96 mg/dL (ref 0–149)
VLDL Cholesterol Cal: 19 mg/dL (ref 5–40)

## 2018-06-24 LAB — HEPATIC FUNCTION PANEL
ALT: 25 IU/L (ref 0–32)
AST: 33 IU/L (ref 0–40)
Albumin: 4.2 g/dL (ref 3.5–4.7)
Alkaline Phosphatase: 103 IU/L (ref 39–117)
BILIRUBIN, DIRECT: 0.12 mg/dL (ref 0.00–0.40)
Bilirubin Total: 0.4 mg/dL (ref 0.0–1.2)
Total Protein: 7.2 g/dL (ref 6.0–8.5)

## 2018-06-24 NOTE — Progress Notes (Signed)
06/24/2018 Tara Potts   05/19/1934  [id number]  Primary Physician [name], MD Primary Cardiologist: Lorretta Harp MD Garret Reddish, [place], [place]  HPI:  Tara Potts is a 84 y.o.  female mildly overweight, widowed [place] American female, mother of 2, grandmother to 1 grandchild who I saw in the office  04/12/2017.[name] Her husband Tara Potts was also a patient of mine and is deceased. She has a history of CAD status post coronary artery bypass grafting November 2007 at [facility]. Her other problems include hypertension, hyperlipidemia and mild carotid disease. She had a Myoview stress test performed January 12, 2011, which was nonischemic. Carotid Dopplers performed August 27, 2011, showed moderate right and mild left ICA stenosis scheduled to be redone in February of next year. I last cath'd her January 09, 2008, revealing diffusely diseased graft to the PDA as well as diffuse PDA disease and I elected to treat her medically at that time. Her major complaints are nocturnal leg cramps. Since I saw her a year ago she has remained asymptomatic denying chest pain or shortness of breath.  She had a right total hip replacement by Dr. Alvan Dame in July of this year which she has recuperated from nicely.   Current Meds  Medication Sig  . Ascorbic Acid (VITAMIN C PO) Take 1 tablet by mouth 2 (two) times daily.   [name] atorvastatin (LIPITOR) 80 MG tablet Take 80 mg by mouth daily.  . Calcium-Magnesium-Vitamin D (CALCIUM MAGNESIUM PO) Take 1 tablet by mouth 2 (two) times daily.   . Cholecalciferol (VITAMIN D) 2000 units tablet Take 2,000 Units by mouth daily.  . Coenzyme Q10 (COQ10 PO) Take 1 capsule by mouth daily.  . furosemide (LASIX) 40 MG tablet Take 40 mg by mouth daily.   [name] GARLIC PO Take 1 tablet by mouth 2 (two) times daily.   . Glucosamine HCl (GLUCOSAMINE PO) Take 1 tablet by mouth daily.  [name] losartan-hydrochlorothiazide (HYZAAR) 50-12.5 MG tablet Take 1 tablet by mouth daily.  . Magnesium  250 MG TABS Take 250 mg by mouth daily.  . metoprolol tartrate (LOPRESSOR) 50 MG tablet Take 1 tablet (50 mg total) by mouth 2 (two) times daily.  . potassium chloride (K-DUR) 10 MEQ tablet TAKE 1 TABLET(10 MEQ) BY MOUTH DAILY     Allergies  Allergen Reactions  . Cymbalta [Duloxetine Hcl] Nausea Only    Social History   Socioeconomic History  . Marital status: Widowed    Spouse name: Not on file  . Number of children: 1  . Years of education: college  . Highest education level: Not on file  Occupational History  . Not on file  Social Needs  . Financial resource strain: Not on file  . Food insecurity:    Worry: Not on file    Inability: Not on file  . Transportation needs:    Medical: Not on file    Non-medical: Not on file  Tobacco Use  . Smoking status: Former Research scientist (life sciences)  . Smokeless tobacco: Never Used  . Tobacco comment: Quit 30 years ago  Substance and Sexual Activity  . Alcohol use: No    Alcohol/week: 0.0 standard drinks  . Drug use: No  . Sexual activity: Not Currently    Birth control/protection: Post-menopausal    Comment: intercourse age 51, more than 5 sexual partners  Lifestyle  . Physical activity:    Days per week: Not on file    Minutes per session: Not on file  .  Stress: Not on file  Relationships  . Social connections:    Talks on phone: Not on file    Gets together: Not on file    Attends religious service: Not on file    Active member of club or organization: Not on file    Attends meetings of clubs or organizations: Not on file    Relationship status: Not on file  . Intimate partner violence:    Fear of current or ex partner: Not on file    Emotionally abused: Not on file    Physically abused: Not on file    Forced sexual activity: Not on file  Other Topics Concern  . Not on file  Social History Narrative   Patient lives at home and her daughter and son in law live with her. Widowed.   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   Education  college.   Left handed.   Caffeine       Review of Systems: General: negative for chills, fever, night sweats or weight changes.  Cardiovascular: negative for chest pain, dyspnea on exertion, edema, orthopnea, palpitations, paroxysmal nocturnal dyspnea or shortness of breath Dermatological: negative for rash Respiratory: negative for cough or wheezing Urologic: negative for hematuria Abdominal: negative for nausea, vomiting, diarrhea, bright red blood per rectum, melena, or hematemesis Neurologic: negative for visual changes, syncope, or dizziness All other systems reviewed and are otherwise negative except as noted above.    Blood pressure (!) 148/60, pulse 71, height 5\' 6"  (1.676 m), weight 192 lb (87.1 kg), last menstrual period 07/16/2002, SpO2 100 %.  General appearance: alert and no distress Neck: no adenopathy, no carotid bruit, no JVD, supple, symmetrical, trachea midline and thyroid not enlarged, symmetric, no tenderness/mass/nodules Lungs: clear to auscultation bilaterally Heart: regular rate and rhythm, S1, S2 normal, no murmur, click, rub or gallop Extremities: extremities normal, atraumatic, no cyanosis or edema Pulses: 2+ and symmetric Skin: Skin color, texture, turgor normal. No rashes or lesions Neurologic: Alert and oriented X 3, normal strength and tone. Normal symmetric reflexes. Normal coordination and gait  EKG not performed today  ASSESSMENT AND PLAN:   Elevated cholesterol History of hyperlipidemia on high-dose statin therapy with lipid profile performed 12/23/2017 revealing total cholesterol 243, LDL of 172 and HDL 56.  We will explore initiating Repatha  Coronary artery disease History of CAD status post coronary artery bypass grafting at [facility] November 2007.  She had a Myoview stress test performed 01/12/2011 which was nonischemic.  I did recath her 01/09/2008 revealing diffusely diseased graft to the PDA as well as diffuse PDA  disease by elected to treat her medically at that time.  She denies chest pain or shortness of breath.  Peripheral vascular disease:  Moderate right and mild left ICA stenosis. History of carotid artery disease with Dopplers performed 04/17/2018 revealing moderate right and mild left ICA stenosis.  This will be rechecked on an annual basis.  Essential hypertension History of essential hypertension her blood pressure measured today 148/60.  She is on metoprolol, losartan and hydrochlorothiazide.  Continue current meds at current dosing.      Lorretta Harp MD FACP,FACC,FAHA, [facility] 06/24/2018 10:47 AM

## 2018-06-24 NOTE — Assessment & Plan Note (Signed)
History of hyperlipidemia on high-dose statin therapy with lipid profile performed 12/23/2017 revealing total cholesterol 243, LDL of 172 and HDL 56.  We will explore initiating Repatha

## 2018-06-24 NOTE — Assessment & Plan Note (Signed)
History of CAD status post coronary artery bypass grafting at [facility] November 2007.  She had a Myoview stress test performed 01/12/2011 which was nonischemic.  I did recath her 01/09/2008 revealing diffusely diseased graft to the PDA as well as diffuse PDA disease by elected to treat her medically at that time.  She denies chest pain or shortness of breath.

## 2018-06-24 NOTE — Addendum Note (Signed)
Addended by: Annita Brod on: 06/24/2018 11:10 AM   Modules accepted: Orders

## 2018-06-24 NOTE — Assessment & Plan Note (Signed)
History of carotid artery disease with Dopplers performed 04/17/2018 revealing moderate right and mild left ICA stenosis.  This will be rechecked on an annual basis.

## 2018-06-24 NOTE — Assessment & Plan Note (Signed)
History of essential hypertension her blood pressure measured today 148/60.  She is on metoprolol, losartan and hydrochlorothiazide.  Continue current meds at current dosing.

## 2018-06-24 NOTE — Patient Instructions (Addendum)
Medication Instructions:  Your physician recommends that you continue on your current medications as directed. Please refer to the Current Medication list given to you today. If you need a refill on your cardiac medications before your next appointment, please call your pharmacy.   Lab work: Your physician recommends that you return for lab work TODAY: LIPID/HEPATIC  If you have labs (blood work) drawn today and your tests are completely normal, you will receive your results only by: [name] MyChart Message (if you have MyChart) OR . A paper copy in the mail If you have any lab test that is abnormal or we need to change your treatment, we will call you to review the results.  Testing/Procedures: Your physician has requested that you have a carotid duplex. This test is an ultrasound of the carotid arteries in your neck. It looks at blood flow through these arteries that supply the brain with blood. Allow one hour for this exam. There are no restrictions or special instructions.  SCHEDULE   Follow-Up: At [facility], you and your health needs are our priority.  As part of our continuing mission to provide you with exceptional heart care, we have created designated Provider Care Teams.  These Care Teams include your primary Cardiologist (physician) and Advanced Practice Providers (APPs -  Physician Assistants and Nurse Practitioners) who all work together to provide you with the care you need, when you need it. You will need a follow up appointment in 12 months.  Please call our office 2 months in advance to schedule this appointment.  You may see Quay Burow, MD or one of the following Advanced Practice Providers on your designated Care Team:   Kerin Ransom, PA-C Roby Lofts, [place] . Sande Rives, PA-C  SPECIAL INSTRUCTIONS:  REFERRAL TO KRISTIN ALVSTAD, [place]

## 2018-06-30 ENCOUNTER — Encounter: Payer: Self-pay | Admitting: *Deleted

## 2018-06-30 DIAGNOSIS — N183 Chronic kidney disease, stage 3 (moderate): Secondary | ICD-10-CM | POA: Diagnosis not present

## 2018-06-30 DIAGNOSIS — Z79899 Other long term (current) drug therapy: Secondary | ICD-10-CM | POA: Diagnosis not present

## 2018-06-30 DIAGNOSIS — I10 Essential (primary) hypertension: Secondary | ICD-10-CM | POA: Diagnosis not present

## 2018-06-30 DIAGNOSIS — Z780 Asymptomatic menopausal state: Secondary | ICD-10-CM | POA: Diagnosis not present

## 2018-06-30 DIAGNOSIS — M8580 Other specified disorders of bone density and structure, unspecified site: Secondary | ICD-10-CM | POA: Diagnosis not present

## 2018-06-30 DIAGNOSIS — E559 Vitamin D deficiency, unspecified: Secondary | ICD-10-CM | POA: Diagnosis not present

## 2018-07-01 DIAGNOSIS — I129 Hypertensive chronic kidney disease with stage 1 through stage 4 chronic kidney disease, or unspecified chronic kidney disease: Secondary | ICD-10-CM | POA: Diagnosis not present

## 2018-07-01 DIAGNOSIS — C9000 Multiple myeloma not having achieved remission: Secondary | ICD-10-CM | POA: Diagnosis not present

## 2018-07-01 DIAGNOSIS — N189 Chronic kidney disease, unspecified: Secondary | ICD-10-CM | POA: Diagnosis not present

## 2018-07-01 DIAGNOSIS — N179 Acute kidney failure, unspecified: Secondary | ICD-10-CM | POA: Diagnosis not present

## 2018-07-01 DIAGNOSIS — D631 Anemia in chronic kidney disease: Secondary | ICD-10-CM | POA: Diagnosis not present

## 2018-07-07 ENCOUNTER — Other Ambulatory Visit: Payer: Self-pay | Admitting: Internal Medicine

## 2018-07-07 DIAGNOSIS — N179 Acute kidney failure, unspecified: Secondary | ICD-10-CM

## 2018-07-29 ENCOUNTER — Ambulatory Visit: Payer: Medicare Other

## 2018-07-29 NOTE — Progress Notes (Deleted)
Patient ID: KYNLEI PIONTEK                 DOB: 1933-10-04                    MRN: [id number]     HPI: Tara Potts is a 85 y.o. female patient referred to lipid clinic by Dr Gwenlyn Found. PMH is significant for   Current Medications:   Intolerances:   Risk Factors:   LDL goal:   Diet:   Exercise:   Family History:   Social History:   Labs:  Past Medical History:  Diagnosis Date  . Anemia   . Arthritis   . Asthma    as a child  . CAD (coronary artery disease)   . Carotid disease, bilateral (HCC)    mild  . Fibroid   . Hx of CABG 2007   [facility]  . Hyperlipemia   . Hypertension   . Menopausal symptoms   . Peripheral neuropathy   . Seizure-like activity ([place])     Current Outpatient Medications on File Prior to Visit  Medication Sig Dispense Refill  . Ascorbic Acid (VITAMIN C PO) Take 1 tablet by mouth 2 (two) times daily.     [name] atorvastatin (LIPITOR) 80 MG tablet Take 80 mg by mouth daily.    . Calcium-Magnesium-Vitamin D (CALCIUM MAGNESIUM PO) Take 1 tablet by mouth 2 (two) times daily.     . Cholecalciferol (VITAMIN D) 2000 units tablet Take 2,000 Units by mouth daily.    . Coenzyme Q10 (COQ10 PO) Take 1 capsule by mouth daily.    . furosemide (LASIX) 40 MG tablet Take 40 mg by mouth daily.   6  . GARLIC PO Take 1 tablet by mouth 2 (two) times daily.     . Glucosamine HCl (GLUCOSAMINE PO) Take 1 tablet by mouth daily.    [name] losartan-hydrochlorothiazide (HYZAAR) 50-12.5 MG tablet Take 1 tablet by mouth daily.  2  . Magnesium 250 MG TABS Take 250 mg by mouth daily.    . metoprolol tartrate (LOPRESSOR) 50 MG tablet Take 1 tablet (50 mg total) by mouth 2 (two) times daily. 180 tablet 3  . potassium chloride (K-DUR) 10 MEQ tablet TAKE 1 TABLET(10 MEQ) BY MOUTH DAILY 90 tablet 3   No current facility-administered medications on file prior to visit.     Allergies  Allergen Reactions  . Cymbalta [Duloxetine Hcl] Nausea Only    No problem-specific Assessment &  Plan notes found for this encounter.    Aalliyah Kilker Rodriguez-Guzman PharmD, BCPS, CPP [place] [address] 07/29/2018 10:17 AM

## 2018-08-12 ENCOUNTER — Ambulatory Visit: Payer: Medicare Other

## 2018-08-12 ENCOUNTER — Ambulatory Visit (INDEPENDENT_AMBULATORY_CARE_PROVIDER_SITE_OTHER): Payer: Medicare Other | Admitting: Pharmacist Clinician (PhC)/ Clinical Pharmacy Specialist

## 2018-08-12 DIAGNOSIS — E7800 Pure hypercholesterolemia, unspecified: Secondary | ICD-10-CM | POA: Diagnosis not present

## 2018-08-12 MED ORDER — EZETIMIBE 10 MG PO TABS: 10.0000 mg | ORAL_TABLET | Freq: Every day | ORAL | 3 refills | Status: DC

## 2018-08-12 NOTE — Patient Instructions (Addendum)
Start ezetimibe 10 mg once daily.  This will help lower your LDL cholesterol further   Current  LDL   140    (goal < 70)                HDL    55     (goal > 40)  Triglycerides  96     (goal < 150)   Cholesterol  Cholesterol is a fat. Your body needs a small amount of cholesterol. Cholesterol (plaque) may build up in your blood vessels (arteries). That makes you more likely to have a heart attack or stroke. You cannot feel your cholesterol level. Having a blood test is the only way to find out if your level is high. Keep your test results. Work with your doctor to keep your cholesterol at a good level. What do the results mean?  Total cholesterol is how much cholesterol is in your blood.  LDL is bad cholesterol. This is the type that can build up. Try to have low LDL.  HDL is good cholesterol. It cleans your blood vessels and carries LDL away. Try to have high HDL.  Triglycerides are fat that the body can store or burn for energy. What are good levels of cholesterol?  Total cholesterol below 200.  LDL below 100 is good for people who have health risks. LDL below 70 is good for people who have very high risks.  HDL above 40 is good. It is best to have HDL of 60 or higher.  Triglycerides below 150. How can I lower my cholesterol? Diet Follow your diet program as told by your doctor.  Choose fish, white meat chicken, or [place] that is roasted or baked. Try not to eat red meat, fried foods, sausage, or lunch meats.  Eat lots of fresh fruits and vegetables.  Choose whole grains, beans, pasta, potatoes, and cereals.  Choose olive oil, corn oil, or canola oil. Only use small amounts.  Try not to eat butter, mayonnaise, shortening, or palm kernel oils.  Try not to eat foods with trans fats.  Choose low-fat or nonfat dairy foods. ? Drink skim or nonfat milk. ? Eat low-fat or nonfat yogurt and cheeses. ? Try not to drink whole milk or cream. ? Try not to eat ice cream, egg  yolks, or full-fat cheeses.  Healthy desserts include angel food cake, ginger snaps, animal crackers, hard candy, popsicles, and low-fat or nonfat frozen yogurt. Try not to eat pastries, cakes, pies, and cookies.  Exercise Follow your exercise program as told by your doctor.  Be more active. Try gardening, walking, and taking the stairs.  Ask your doctor about ways that you can be more active. Medicine  Take over-the-counter and prescription medicines only as told by your doctor. This information is not intended to replace advice given to you by your health care provider. Make sure you discuss any questions you have with your health care provider. Document Released: 09/28/2008 Document Revised: 02/01/2016 Document Reviewed: 01/12/2016 Elsevier Interactive Patient Education  2019 [employer].

## 2018-08-12 NOTE — Assessment & Plan Note (Signed)
Patient with probable familial hyperlipidemia, post CABG, currently on atorvastatin 80 mg daily.  LDL not to goal, currently at 140, but much improved from baseline of 319 back in 2016.  While she has achieved a > 50% drop, would still like to see if we can get LDL lower.  Offered her the option of PCSK-9 inhibitors but patient declines, stating she doesn't do needles.  Instead we will start her on ezetimibe 10 mg daily.  She will get labs repeated in 3 months and we can determine future plans from then.

## 2018-08-12 NOTE — Progress Notes (Signed)
08/12/2018 JAYSA KISE 1934/04/26 [id number]   HPI:  Tara Potts is a 85 y.o. female patient of Dr Gwenlyn Found, who presents today for a lipid clinic evaluation.  In addition to hyperlipidemia, his medical history is significant for ASCVD including CABG in 2007, mild carotid disease, and moderate mid left ICA stenosis.    In looking thru her past history, her LDL cholesterol was as high as 319 in August 2016.  She has not had any problems with the atorvastatin, and continues with the 80 mg daily.   Her only other concern today involves some chronic edema in her feet and calves.  She notes that it started shortly after having hip replacement surgery last July, and always clears up overnight, only to return by mid-day.    Current Medications: atorvastatin 80 mg  Cholesterol Goals: LDL < 70  Family history:   Mother died from MI around 5  Father died in his 64's more related to chronic illness/old age  Multiple siblings with no significant heart issues  1 daughter - prefers holistic/alternative medicine  Diet: not much fried food, eats out several times per week, likes chicken (baked) and occasional beef,  does not eat pork; plenty of fruits/vegetables; likes to snack on cookies or popcorn; avoids salt when can  Exercise:  Previously went to exercise downstairs at ortho rehab, hasn't been since Christmas  Labs:  06/2018  TC 214, TG 96, HDL 55, LDL 140  09/2015  TC 328, TG 51, HLD 60, LDL 256 (pulled from [place])  02/2015   TC 402, TG 108, HDL 61, LDL 319 (pulled from [facility])  Current Outpatient Medications  Medication Sig Dispense Refill  . Ascorbic Acid (VITAMIN C PO) Take 1 tablet by mouth 2 (two) times daily.     [name] atorvastatin (LIPITOR) 80 MG tablet Take 80 mg by mouth daily.    . Calcium-Magnesium-Vitamin D (CALCIUM MAGNESIUM PO) Take 1 tablet by mouth 2 (two) times daily.     . Cholecalciferol (VITAMIN D) 2000 units tablet Take 2,000 Units by mouth daily.    . Coenzyme Q10 (COQ10 PO) Take  1 capsule by mouth daily.    . furosemide (LASIX) 40 MG tablet Take 40 mg by mouth daily.   6  . GARLIC PO Take 1 tablet by mouth 2 (two) times daily.     . Glucosamine HCl (GLUCOSAMINE PO) Take 1 tablet by mouth daily.    [name] losartan-hydrochlorothiazide (HYZAAR) 50-12.5 MG tablet Take 1 tablet by mouth daily.  2  . Magnesium 250 MG TABS Take 250 mg by mouth daily.    . metoprolol tartrate (LOPRESSOR) 50 MG tablet Take 1 tablet (50 mg total) by mouth 2 (two) times daily. 180 tablet 3  . potassium chloride (K-DUR) 10 MEQ tablet TAKE 1 TABLET(10 MEQ) BY MOUTH DAILY 90 tablet 3  . ezetimibe (ZETIA) 10 MG tablet Take 1 tablet (10 mg total) by mouth daily. 90 tablet 3   No current facility-administered medications for this visit.     Allergies  Allergen Reactions  . Cymbalta [Duloxetine Hcl] Nausea Only    Past Medical History:  Diagnosis Date  . Anemia   . Arthritis   . Asthma    as a child  . CAD (coronary artery disease)   . Carotid disease, bilateral (HCC)    mild  . Fibroid   . Hx of CABG 2007   [facility]  . Hyperlipemia   . Hypertension   . Menopausal symptoms   .  Peripheral neuropathy   . Seizure-like activity (HCC)     Blood pressure (!) 192/78, pulse 81, resp. rate 14, height 5\' 6"  (1.676 m), weight 189 lb 9.6 oz (86 kg), last menstrual period 07/16/2002.   Elevated cholesterol Patient with probable familial hyperlipidemia, post CABG, currently on atorvastatin 80 mg daily.  LDL not to goal, currently at 140, but much improved from baseline of 319 back in 2016.  While she has achieved a > 50% drop, would still like to see if we can get LDL lower.  Offered her the option of PCSK-9 inhibitors but patient declines, stating she doesn't do needles.  Instead we will start her on ezetimibe 10 mg daily.  She will get labs repeated in 3 months and we can determine future plans from then.     Tommy Medal PharmD CPP [facility]

## 2018-08-18 DIAGNOSIS — L821 Other seborrheic keratosis: Secondary | ICD-10-CM | POA: Diagnosis not present

## 2018-08-18 DIAGNOSIS — M62838 Other muscle spasm: Secondary | ICD-10-CM | POA: Diagnosis not present

## 2018-08-22 ENCOUNTER — Ambulatory Visit
Admission: RE | Admit: 2018-08-22 | Discharge: 2018-08-22 | Disposition: A | Discharge status: Home / Self Care | Payer: Medicare Other | Source: Ambulatory Visit | Attending: Internal Medicine | Admitting: Internal Medicine

## 2018-08-22 DIAGNOSIS — N179 Acute kidney failure, unspecified: Secondary | ICD-10-CM

## 2018-08-25 ENCOUNTER — Inpatient Hospital Stay: Payer: Medicare Other | Attending: Internal Medicine

## 2018-08-25 DIAGNOSIS — Z79899 Other long term (current) drug therapy: Secondary | ICD-10-CM | POA: Diagnosis not present

## 2018-08-25 DIAGNOSIS — C9000 Multiple myeloma not having achieved remission: Secondary | ICD-10-CM | POA: Insufficient documentation

## 2018-08-25 DIAGNOSIS — I10 Essential (primary) hypertension: Secondary | ICD-10-CM | POA: Insufficient documentation

## 2018-08-25 DIAGNOSIS — Z951 Presence of aortocoronary bypass graft: Secondary | ICD-10-CM | POA: Insufficient documentation

## 2018-08-25 DIAGNOSIS — Z7982 Long term (current) use of aspirin: Secondary | ICD-10-CM | POA: Insufficient documentation

## 2018-08-25 LAB — CBC WITH DIFFERENTIAL (CANCER CENTER ONLY)
Abs Immature Granulocytes: 0.01 10*3/uL (ref 0.00–0.07)
BASOS ABS: 0 10*3/uL (ref 0.0–0.1)
BASOS PCT: 0 %
Eosinophils Absolute: 0.2 10*3/uL (ref 0.0–0.5)
Eosinophils Relative: 5 %
HCT: 34.7 % — ABNORMAL LOW (ref 36.0–46.0)
Hemoglobin: 11 g/dL — ABNORMAL LOW (ref 12.0–15.0)
Immature Granulocytes: 0 %
Lymphocytes Relative: 38 %
Lymphs Abs: 2 10*3/uL (ref 0.7–4.0)
MCH: 25.6 pg — ABNORMAL LOW (ref 26.0–34.0)
MCHC: 31.7 g/dL (ref 30.0–36.0)
MCV: 80.9 fL (ref 80.0–100.0)
Monocytes Absolute: 0.6 10*3/uL (ref 0.1–1.0)
Monocytes Relative: 11 %
NRBC: 0 % (ref 0.0–0.2)
Neutro Abs: 2.3 10*3/uL (ref 1.7–7.7)
Neutrophils Relative %: 46 %
Platelet Count: 224 10*3/uL (ref 150–400)
RBC: 4.29 MIL/uL (ref 3.87–5.11)
RDW: 18.4 % — ABNORMAL HIGH (ref 11.5–15.5)
WBC Count: 5.2 10*3/uL (ref 4.0–10.5)

## 2018-08-25 LAB — CMP (CANCER CENTER ONLY)
ALK PHOS: 89 U/L (ref 38–126)
ALT: 25 U/L (ref 0–44)
AST: 33 U/L (ref 15–41)
Albumin: 3.8 g/dL (ref 3.5–5.0)
Anion gap: 8 (ref 5–15)
BUN: 23 mg/dL (ref 8–23)
CO2: 29 mmol/L (ref 22–32)
Calcium: 9.4 mg/dL (ref 8.9–10.3)
Chloride: 103 mmol/L (ref 98–111)
Creatinine: 1.49 mg/dL — ABNORMAL HIGH (ref 0.44–1.00)
GFR, Est AFR Am: 37 mL/min — ABNORMAL LOW (ref >60)
GFR, Estimated: 32 mL/min — ABNORMAL LOW (ref >60)
Glucose, Bld: 91 mg/dL (ref 70–99)
Potassium: 4.5 mmol/L (ref 3.5–5.1)
Sodium: 140 mmol/L (ref 135–145)
Total Bilirubin: 0.6 mg/dL (ref 0.3–1.2)
Total Protein: 8.2 g/dL — ABNORMAL HIGH (ref 6.5–8.1)

## 2018-08-25 LAB — LACTATE DEHYDROGENASE: LDH: 209 U/L — ABNORMAL HIGH (ref 98–192)

## 2018-08-26 LAB — KAPPA/LAMBDA LIGHT CHAINS
Kappa free light chain: 452.7 mg/L — ABNORMAL HIGH (ref 3.3–19.4)
Kappa, lambda light chain ratio: 49.75 — ABNORMAL HIGH (ref 0.26–1.65)
Lambda free light chains: 9.1 mg/L (ref 5.7–26.3)

## 2018-08-26 LAB — IGG, IGA, IGM
IGG (IMMUNOGLOBIN G), SERUM: 1826 mg/dL — AB (ref 700–1600)
IgA: 121 mg/dL (ref 64–422)
IgM (Immunoglobulin M), Srm: 30 mg/dL (ref 26–217)

## 2018-08-26 LAB — BETA 2 MICROGLOBULIN, SERUM: Beta-2 Microglobulin: 3.1 mg/L — ABNORMAL HIGH (ref 0.6–2.4)

## 2018-09-01 ENCOUNTER — Encounter: Payer: Self-pay | Admitting: Internal Medicine

## 2018-09-01 ENCOUNTER — Inpatient Hospital Stay (HOSPITAL_BASED_OUTPATIENT_CLINIC_OR_DEPARTMENT_OTHER): Payer: Medicare Other | Admitting: Internal Medicine

## 2018-09-01 ENCOUNTER — Telehealth: Payer: Self-pay | Admitting: Internal Medicine

## 2018-09-01 VITALS — BP 194/67 | HR 80 | Temp 97.8°F | Resp 18 | Ht 66.0 in | Wt 191.6 lb

## 2018-09-01 DIAGNOSIS — I10 Essential (primary) hypertension: Secondary | ICD-10-CM

## 2018-09-01 DIAGNOSIS — Z7982 Long term (current) use of aspirin: Secondary | ICD-10-CM | POA: Diagnosis not present

## 2018-09-01 DIAGNOSIS — Z951 Presence of aortocoronary bypass graft: Secondary | ICD-10-CM | POA: Diagnosis not present

## 2018-09-01 DIAGNOSIS — Z79899 Other long term (current) drug therapy: Secondary | ICD-10-CM | POA: Diagnosis not present

## 2018-09-01 DIAGNOSIS — C9000 Multiple myeloma not having achieved remission: Secondary | ICD-10-CM | POA: Diagnosis not present

## 2018-09-01 MED ORDER — CLONIDINE HCL 0.1 MG PO TABS
0.2000 mg | ORAL_TABLET | Freq: Once | ORAL | Status: AC
  Administered 2018-09-01: 0.2 mg via ORAL

## 2018-09-01 MED ORDER — CLONIDINE HCL 0.1 MG PO TABS
ORAL_TABLET | ORAL | Status: AC
  Filled 2018-09-01: qty 2

## 2018-09-01 NOTE — Telephone Encounter (Signed)
Scheduled appt per 2/17 los - sent reminder letter in the mail - f/u in 3 months

## 2018-09-01 NOTE — Progress Notes (Signed)
[place] [place] Telephone:([phone]   Fax:(336) [place], [place] [address]  DIAGNOSIS: Plasma cell neoplasm diagnosed in October 2019.  PRIOR THERAPY: None  CURRENT THERAPY: Observation.  INTERVAL HISTORY: Tara Potts 85 y.o. female returns to the clinic today for follow-up visit accompanied by her daughter.  The patient is feeling fine today with no concerning complaints except for fatigue.  She also has uncontrolled hypertension and she is on multiple blood pressure medication.  She has swelling of the lower extremities and currently on diuretics.  She denied having any chest pain, shortness of breath, cough or hemoptysis.  She denied having any fever or chills.  She has no nausea, vomiting, diarrhea or constipation.  She has no headache or visual changes.  The patient had repeat myeloma panel performed recently and she is here for evaluation and discussion of her lab results.  MEDICAL HISTORY: Past Medical History:  Diagnosis Date  . Anemia   . Arthritis   . Asthma    as a child  . CAD (coronary artery disease)   . Carotid disease, bilateral (HCC)    mild  . Fibroid   . Hx of CABG 2007   [facility]  . Hyperlipemia   . Hypertension   . Menopausal symptoms   . Peripheral neuropathy   . Seizure-like activity (HCC)     ALLERGIES:  is allergic to cymbalta [duloxetine hcl].  MEDICATIONS:  Current Outpatient Medications  Medication Sig Dispense Refill  . Ascorbic Acid (VITAMIN C PO) Take 1 tablet by mouth 2 (two) times daily.     [name] aspirin EC 81 MG tablet Take 81 mg by mouth daily.    [name] atorvastatin (LIPITOR) 80 MG tablet Take 80 mg by mouth daily.    . Cholecalciferol (VITAMIN D) 2000 units tablet Take 2,000 Units by mouth daily.    [name] ezetimibe (ZETIA) 10 MG tablet Take 1 tablet (10 mg total) by mouth daily. 90 tablet 3  . furosemide (LASIX) 40 MG tablet Take 40 mg by  mouth daily.   6  . GARLIC PO Take 1 tablet by mouth 2 (two) times daily.     . Glucosamine HCl (GLUCOSAMINE PO) Take 1 tablet by mouth daily.    [name] losartan-hydrochlorothiazide (HYZAAR) 50-12.5 MG tablet Take 1 tablet by mouth daily.  2  . Magnesium 250 MG TABS Take 250 mg by mouth daily.    . metoprolol tartrate (LOPRESSOR) 50 MG tablet Take 1 tablet (50 mg total) by mouth 2 (two) times daily. 180 tablet 3  . potassium chloride (K-DUR) 10 MEQ tablet TAKE 1 TABLET(10 MEQ) BY MOUTH DAILY 90 tablet 3  . Calcium-Magnesium-Vitamin D (CALCIUM MAGNESIUM PO) Take 1 tablet by mouth 2 (two) times daily.     . Coenzyme Q10 (COQ10 PO) Take 1 capsule by mouth daily.     No current facility-administered medications for this visit.     SURGICAL HISTORY:  Past Surgical History:  Procedure Laterality Date  . CARDIAC CATHETERIZATION  01/09/2008   diffusely diseased graft to the PDA & diffuse PDA disease  . CAROTID DOPPLER  08/27/2011   mod. right & nild left ICA stenosis  . CATARACT SURGERY    . CORONARY ARTERY BYPASS GRAFT    . FOOT SURGERY    . JOINT REPLACEMENT     Right hip Dr. Alvan Dame 01-28-18   . MASS REMOVED FROM CERVIX  12/1997   Benign endocervical inclusion cysts  . NM MYOVIEW LTD  01/12/2011   no ischemia  . TOTAL HIP ARTHROPLASTY Right 01/28/2018   Procedure: RIGHT TOTAL HIP ARTHROPLASTY ANTERIOR APPROACH;  Surgeon: Paralee Cancel, MD;  Location: [facility];  Service: Orthopedics;  Laterality: Right;  70 mins  . TRIPLE HEART BYPASS  05/2006   [facility]  . US ECHOCARDIOGRAPHY  10/30/2007   mild MA,MR,TR,AOV mildly sclerotic,density oted in the prox asc aorta    REVIEW OF SYSTEMS:  A comprehensive review of systems was negative except for: Constitutional: positive for fatigue   PHYSICAL EXAMINATION: General appearance: alert, cooperative, fatigued and no distress Head: Normocephalic, without obvious abnormality, atraumatic Neck: no adenopathy, no JVD, supple, symmetrical, trachea midline  and thyroid not enlarged, symmetric, no tenderness/mass/nodules Lymph nodes: Cervical, supraclavicular, and axillary nodes normal. Resp: clear to auscultation bilaterally Back: symmetric, no curvature. ROM normal. No CVA tenderness. Cardio: regular rate and rhythm, S1, S2 normal, no murmur, click, rub or gallop GI: soft, non-tender; bowel sounds normal; no masses,  no organomegaly Extremities: extremities normal, atraumatic, no cyanosis or edema  ECOG PERFORMANCE STATUS: 1 - Symptomatic but completely ambulatory  Blood pressure (!) 194/67, pulse 80, temperature 97.8 F (36.6 C), temperature source Oral, resp. rate 18, height _0  (1.676 m), weight 191 lb 9.6 oz (86.9 kg), last menstrual period 07/16/2002, SpO2 100 %.  LABORATORY DATA: Lab Results  Component Value Date   WBC 5.2 08/25/2018   HGB 11.0 (L) 08/25/2018   HCT 34.7 (L) 08/25/2018   MCV 80.9 08/25/2018   PLT 224 08/25/2018      Chemistry      Component Value Date/Time   NA 140 08/25/2018 1137   NA 138 07/03/2017 0928   K 4.5 08/25/2018 1137   K 4.6 07/03/2017 0928   CL 103 08/25/2018 1137   CO2 29 08/25/2018 1137   CO2 29 07/03/2017 0928   BUN 23 08/25/2018 1137   BUN 25.5 07/03/2017 0928   CREATININE 1.49 (H) 08/25/2018 1137   CREATININE 1.5 (H) 07/03/2017 0928      Component Value Date/Time   CALCIUM 9.4 08/25/2018 1137   CALCIUM 9.4 07/03/2017 0928   ALKPHOS 89 08/25/2018 1137   ALKPHOS 72 07/03/2017 0928   AST 33 08/25/2018 1137   AST 25 07/03/2017 0928   ALT 25 08/25/2018 1137   ALT 17 07/03/2017 0928   BILITOT 0.6 08/25/2018 1137   BILITOT 0.51 07/03/2017 0928     Myeloma panel: Beta-2 microglobulin 3.1, IgG 1826, IgA 121, IgM 30, free kappa light chain 452.7, free lambda light chain 9.1 with a kappa/lambda ratio of 49.75  RADIOGRAPHIC STUDIES: US Renal  Result Date: 08/22/2018 CLINICAL DATA:  Acute kidney injury, history hypertension EXAM: RENAL / URINARY TRACT ULTRASOUND COMPLETE COMPARISON:   None FINDINGS: Right Kidney: Renal measurements: 8.5 x 3.8 x 4.4 cm = volume: 74 mL. Normal cortical thickness. Slightly increased cortical echogenicity. No mass, hydronephrosis or shadowing calcification. Left Kidney: Renal measurements: 8.2 x 6.3 x 4.4 cm = volume: 119 mL. Normal cortical thickness. Increased cortical echogenicity. No mass, hydronephrosis or shadowing calcification. Bladder: Appears normal for degree of bladder distention. IMPRESSION: Medical renal disease changes of both kidneys. No evidence of renal mass or hydronephrosis. Electronically Signed   By: Lavonia Dana M.D.   On: 08/22/2018 20:29    ASSESSMENT AND PLAN:  This is a very pleasant 85 years old white female with plasma cell neoplasm, multiple myeloma.  I had  a lengthy discussion with the patient and her daughter about her condition and treatment options.  She had repeat multiple myeloma performed recently that showed mild increase in IgG and free kappa light chain. I discussed with the patient again the option of treatment versus continuous observation and monitoring.  The patient is not interested in treatment at this point and she would like to continue on observation. For hypertension I will give her a dose of clonidine 0.2 mg p.o. x1 today.  She was also advised to take her blood pressure medication and to monitor it closely at home and to report to her primary care physician. I will see her back for follow-up visit in 3 months for evaluation with repeat myeloma panel. The patient was advised to call immediately if she has any concerning symptoms in the interval. The patient voices understanding of current disease status and treatment options and is in agreement with the current care plan.  All questions were answered. The patient knows to call the clinic with any problems, questions or concerns. We can certainly see the patient much sooner if necessary.  I spent 10 minutes counseling the patient face to face. The total  time spent in the appointment was 15 minutes.  Disclaimer: This note was dictated with voice recognition software. Similar sounding words can inadvertently be transcribed and may not be corrected upon review.

## 2018-10-01 ENCOUNTER — Encounter: Payer: Medicare Other | Admitting: Gynecology

## 2018-10-03 ENCOUNTER — Other Ambulatory Visit: Payer: Self-pay | Admitting: Cardiovascular Disease

## 2018-11-17 ENCOUNTER — Other Ambulatory Visit: Payer: Self-pay

## 2018-11-17 MED ORDER — POTASSIUM CHLORIDE ER 10 MEQ PO TBCR: EXTENDED_RELEASE_TABLET | ORAL | 3 refills | Status: DC

## 2018-11-18 ENCOUNTER — Other Ambulatory Visit: Payer: Self-pay

## 2018-11-18 MED ORDER — POTASSIUM CHLORIDE ER 10 MEQ PO TBCR: EXTENDED_RELEASE_TABLET | ORAL | 3 refills | Status: DC

## 2018-11-24 ENCOUNTER — Inpatient Hospital Stay: Payer: Medicare Other | Attending: Internal Medicine

## 2018-12-01 ENCOUNTER — Inpatient Hospital Stay: Payer: Medicare Other | Admitting: Internal Medicine

## 2018-12-22 DIAGNOSIS — Z471 Aftercare following joint replacement surgery: Secondary | ICD-10-CM | POA: Diagnosis not present

## 2018-12-22 DIAGNOSIS — Z96641 Presence of right artificial hip joint: Secondary | ICD-10-CM | POA: Diagnosis not present

## 2019-01-26 ENCOUNTER — Telehealth: Payer: Self-pay | Admitting: Pharmacist Clinician (PhC)/ Clinical Pharmacy Specialist

## 2019-01-26 NOTE — Telephone Encounter (Signed)
LMOM for patient to return call.  She started ezetimibe in Jan 2020.  Was to repeat labs late April, but never done due to [place].

## 2019-02-06 ENCOUNTER — Other Ambulatory Visit: Payer: Self-pay | Admitting: Pharmacist Clinician (PhC)/ Clinical Pharmacy Specialist

## 2019-02-06 ENCOUNTER — Encounter: Payer: Self-pay | Admitting: Gynecology

## 2019-02-06 DIAGNOSIS — Z1231 Encounter for screening mammogram for malignant neoplasm of breast: Secondary | ICD-10-CM | POA: Diagnosis not present

## 2019-02-06 MED ORDER — EZETIMIBE 10 MG PO TABS: 10.0000 mg | ORAL_TABLET | Freq: Every day | ORAL | 3 refills | Status: DC

## 2019-02-06 NOTE — Telephone Encounter (Signed)
Patient walked into office today.  Was confused about a phone call from earlier.  I had left a message on her phone regarding need for cholesterol labs.  Started her on ezetimibe 10 mg in addition to atorvastatin 80 mg.  Apparently she never started on ezetimibe.  Explained that the call was to have her repeat labs since we assumed she was on both medications.  Do not need these labs until after she has been on ezetimibe.    Patient will pick up prescription and I will note that she needs repeat labs in late Oct/Nov.

## 2019-02-27 DIAGNOSIS — Z96641 Presence of right artificial hip joint: Secondary | ICD-10-CM | POA: Diagnosis not present

## 2019-02-27 DIAGNOSIS — M545 Low back pain: Secondary | ICD-10-CM | POA: Diagnosis not present

## 2019-02-27 DIAGNOSIS — M7061 Trochanteric bursitis, right hip: Secondary | ICD-10-CM | POA: Diagnosis not present

## 2019-02-27 DIAGNOSIS — M5136 Other intervertebral disc degeneration, lumbar region: Secondary | ICD-10-CM | POA: Diagnosis not present

## 2019-04-02 DIAGNOSIS — M545 Low back pain: Secondary | ICD-10-CM | POA: Diagnosis not present

## 2019-04-02 DIAGNOSIS — M7061 Trochanteric bursitis, right hip: Secondary | ICD-10-CM | POA: Diagnosis not present

## 2019-04-02 DIAGNOSIS — Z96641 Presence of right artificial hip joint: Secondary | ICD-10-CM | POA: Diagnosis not present

## 2019-04-02 DIAGNOSIS — M5136 Other intervertebral disc degeneration, lumbar region: Secondary | ICD-10-CM | POA: Diagnosis not present

## 2019-04-14 ENCOUNTER — Encounter: Payer: Self-pay | Admitting: Neurology

## 2019-04-14 ENCOUNTER — Ambulatory Visit: Payer: Medicare Other | Admitting: Neurology

## 2019-04-14 ENCOUNTER — Telehealth: Payer: Self-pay | Admitting: *Deleted

## 2019-04-14 NOTE — Telephone Encounter (Signed)
No showed follow up appointment. 

## 2019-04-15 DIAGNOSIS — C9000 Multiple myeloma not having achieved remission: Secondary | ICD-10-CM | POA: Diagnosis not present

## 2019-04-15 DIAGNOSIS — I129 Hypertensive chronic kidney disease with stage 1 through stage 4 chronic kidney disease, or unspecified chronic kidney disease: Secondary | ICD-10-CM | POA: Diagnosis not present

## 2019-04-15 DIAGNOSIS — N183 Chronic kidney disease, stage 3 (moderate): Secondary | ICD-10-CM | POA: Diagnosis not present

## 2019-04-15 DIAGNOSIS — D631 Anemia in chronic kidney disease: Secondary | ICD-10-CM | POA: Diagnosis not present

## 2019-04-22 ENCOUNTER — Ambulatory Visit (HOSPITAL_COMMUNITY)
Admission: RE | Admit: 2019-04-22 | Discharge: 2019-04-22 | Disposition: A | Discharge status: Home / Self Care | Payer: Medicare Other | Source: Ambulatory Visit | Attending: Cardiology | Admitting: Cardiology

## 2019-04-22 ENCOUNTER — Other Ambulatory Visit: Payer: Self-pay

## 2019-04-22 ENCOUNTER — Other Ambulatory Visit (HOSPITAL_COMMUNITY): Payer: Self-pay | Admitting: Cardiovascular Disease

## 2019-04-22 DIAGNOSIS — I6523 Occlusion and stenosis of bilateral carotid arteries: Secondary | ICD-10-CM

## 2019-04-23 ENCOUNTER — Other Ambulatory Visit: Payer: Self-pay | Admitting: *Deleted

## 2019-04-23 ENCOUNTER — Encounter: Payer: Self-pay | Admitting: Gynecology

## 2019-04-23 ENCOUNTER — Encounter: Payer: Self-pay | Admitting: *Deleted

## 2019-04-23 DIAGNOSIS — I6523 Occlusion and stenosis of bilateral carotid arteries: Secondary | ICD-10-CM

## 2019-04-23 NOTE — Progress Notes (Signed)
vas 

## 2019-04-27 IMAGING — US US RENAL
1 series · 14 of 25 positions shown · non-contrast
Comparison: None

CLINICAL DATA: Acute kidney injury, history hypertension

EXAM:
RENAL / URINARY TRACT ULTRASOUND COMPLETE

[Series 1: us renal · 0.19mm/px · 14 of 32 slices shown]
[im 1/32]
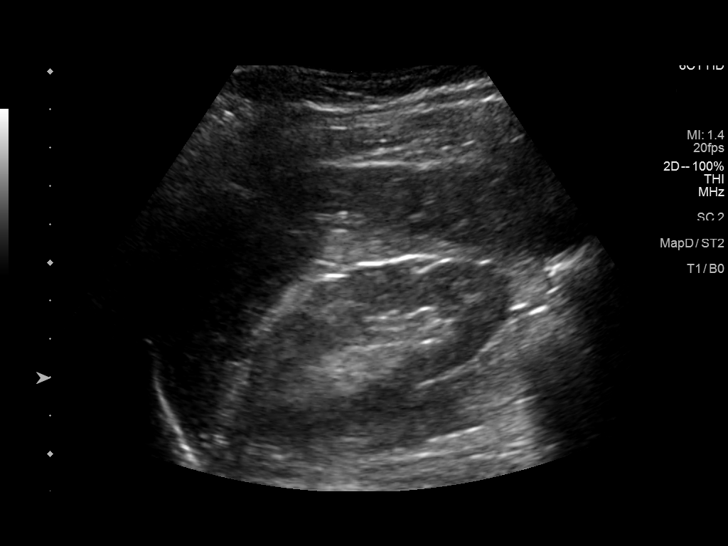
[im 3/32]
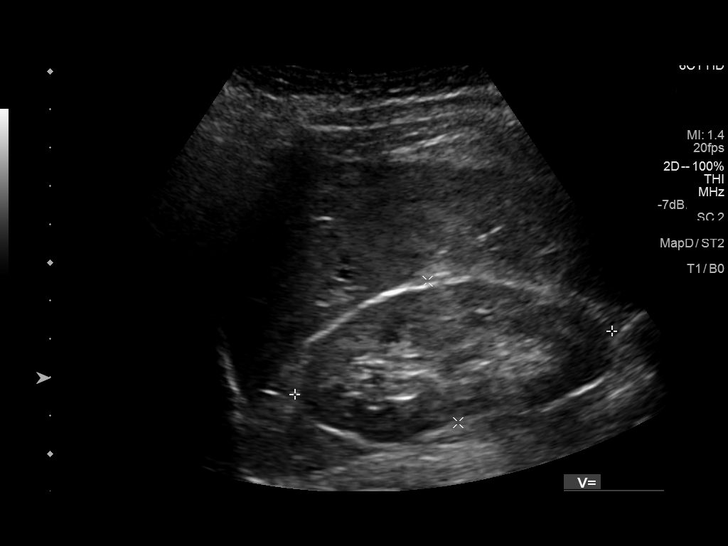
[im 6/32]
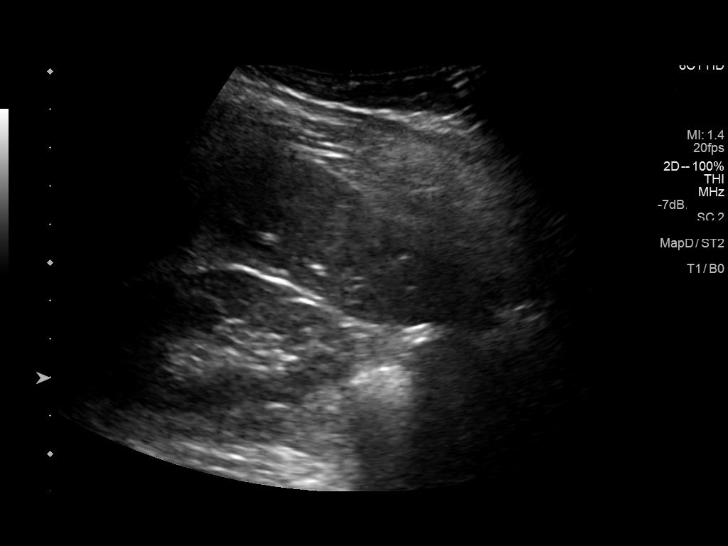
[im 8/32]
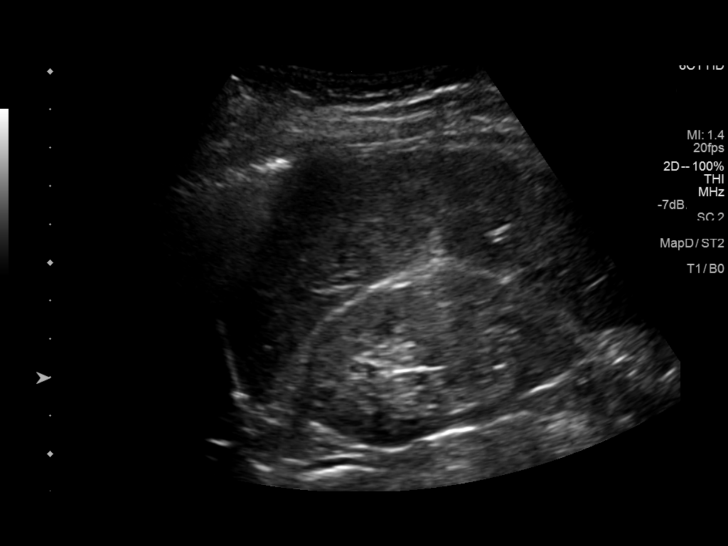
[im 11/32]
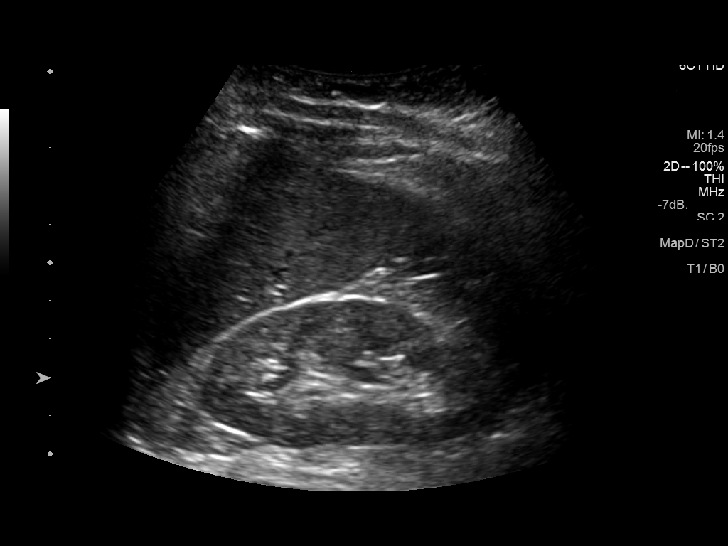
[im 12/32]
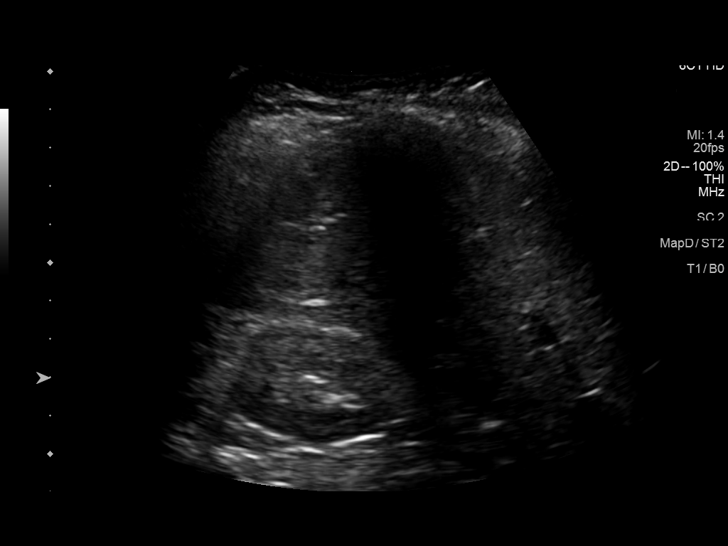
[im 15/32]
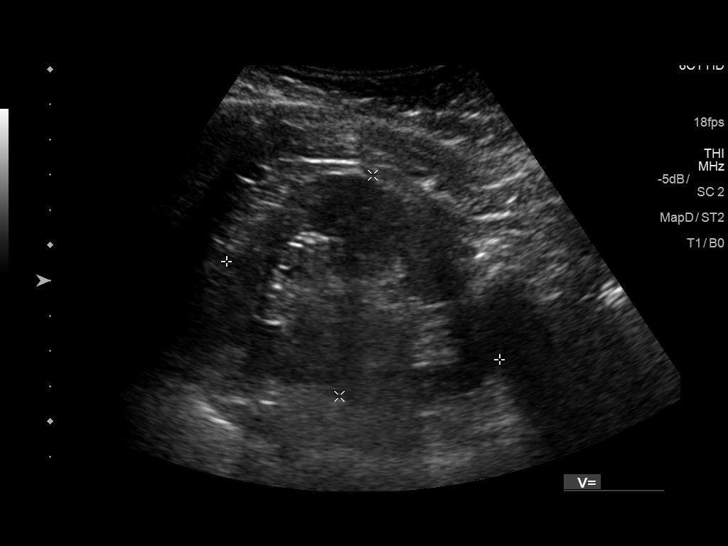
[im 17/32]
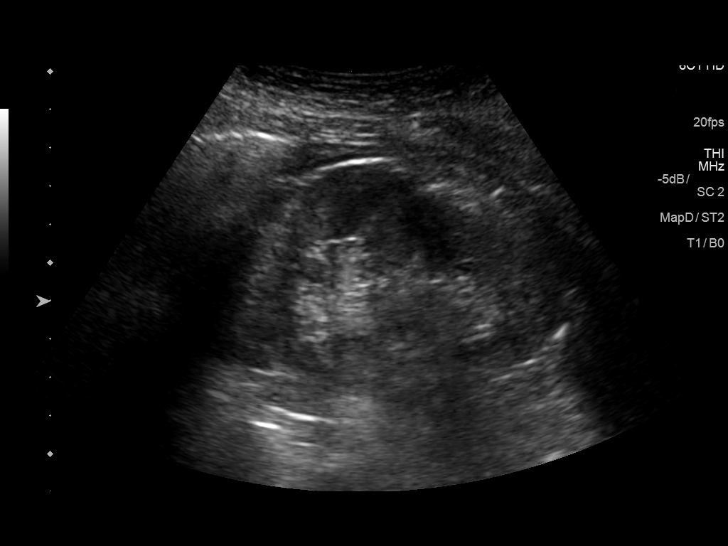
[im 20/32]
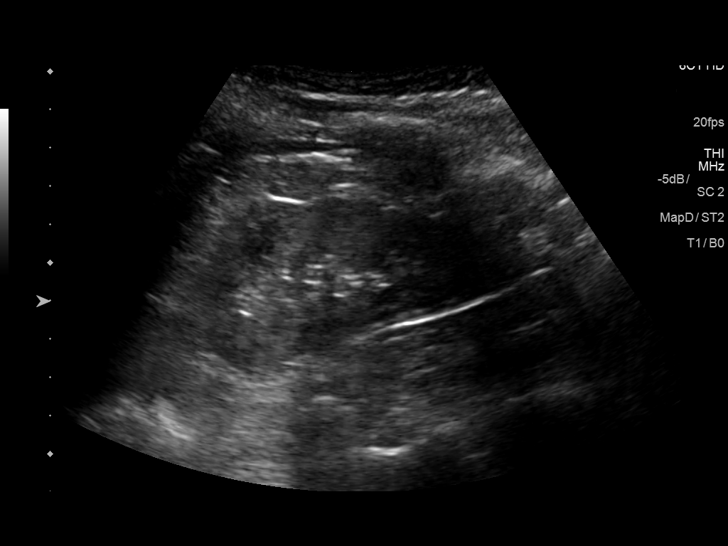
[im 21/32]
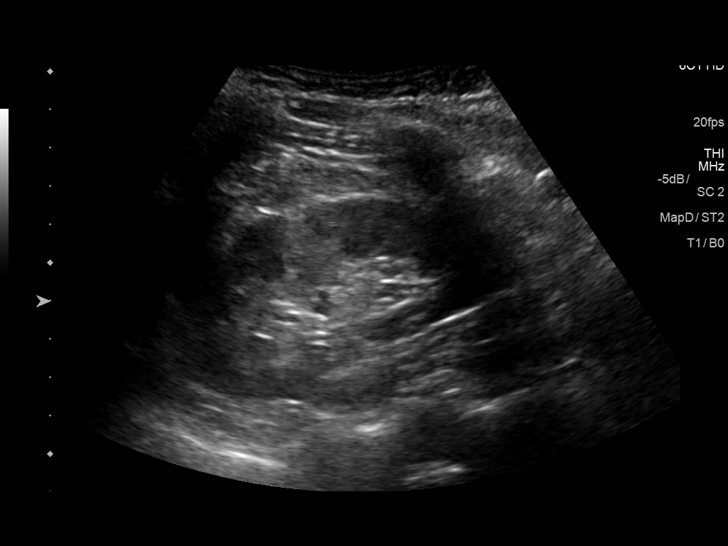
[im 24/32]
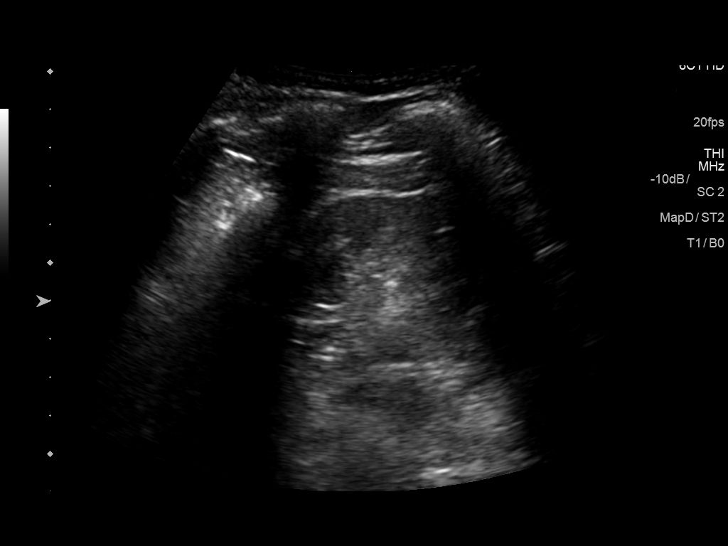
[im 26/32]
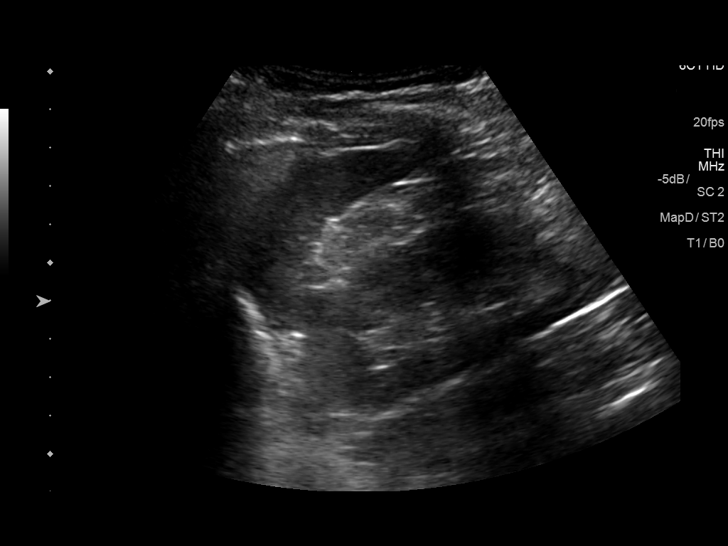
[im 29/32]
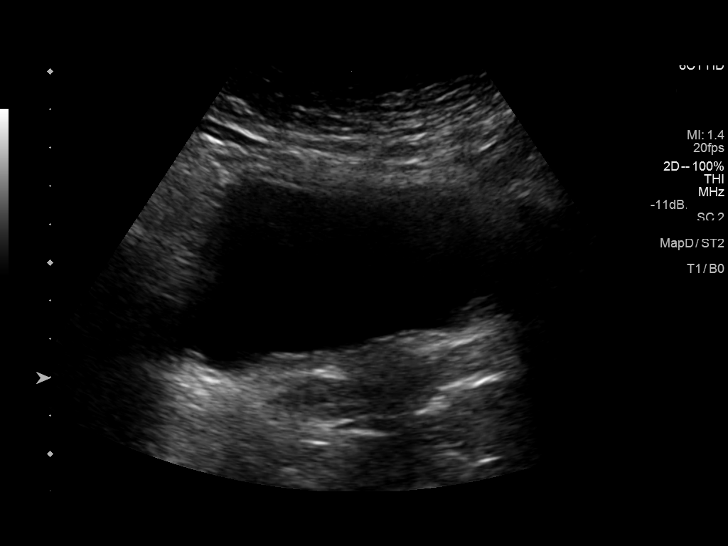
[im 32/32]
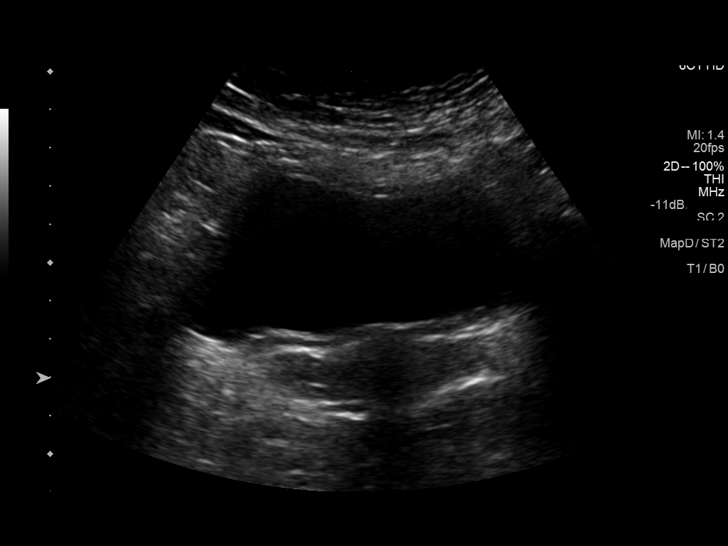

[14 of 25 positions shown; findings below may reference images not displayed]

FINDINGS: Right Kidney:

Renal measurements: 8.5 x 3.8 x 4.4 cm = volume: 74 mL. Normal
cortical thickness. Slightly increased cortical echogenicity. No
mass, hydronephrosis or shadowing calcification.

Left Kidney:

Renal measurements: 8.2 x 6.3 x 4.4 cm = volume: 119 mL. Normal
cortical thickness. Increased cortical echogenicity. No mass,
hydronephrosis or shadowing calcification.

Bladder:

Appears normal for degree of bladder distention.
IMPRESSION: Medical renal disease changes of both kidneys.

No evidence of renal mass or hydronephrosis.

## 2019-04-28 ENCOUNTER — Other Ambulatory Visit: Payer: Self-pay

## 2019-04-28 ENCOUNTER — Encounter: Payer: Self-pay | Admitting: Cardiovascular Disease

## 2019-04-28 ENCOUNTER — Ambulatory Visit (INDEPENDENT_AMBULATORY_CARE_PROVIDER_SITE_OTHER): Payer: Medicare Other | Admitting: Cardiovascular Disease

## 2019-04-28 DIAGNOSIS — I2510 Atherosclerotic heart disease of native coronary artery without angina pectoris: Secondary | ICD-10-CM | POA: Diagnosis not present

## 2019-04-28 DIAGNOSIS — E7800 Pure hypercholesterolemia, unspecified: Secondary | ICD-10-CM

## 2019-04-28 DIAGNOSIS — I10 Essential (primary) hypertension: Secondary | ICD-10-CM

## 2019-04-28 DIAGNOSIS — I6523 Occlusion and stenosis of bilateral carotid arteries: Secondary | ICD-10-CM

## 2019-04-28 DIAGNOSIS — I739 Peripheral vascular disease, unspecified: Secondary | ICD-10-CM | POA: Diagnosis not present

## 2019-04-28 NOTE — Progress Notes (Signed)
04/28/2019 Tara Potts   Nov 26, 1933  [id number]  Primary Physician Tara Pretty, MD Primary Cardiologist: Tara Harp MD FACP, [place], [place], [place]  HPI:  Tara Potts is a 85 y.o.  mildly overweight, widowed [place] American female, mother of 2, grandmother to 1 grandchild who I saw in the office  06/24/2018.[name] Her husband Tara Potts was also a patient of mine and is deceased. She has a history of CAD status post coronary artery bypass grafting November 2007 at [facility]. Her other problems include hypertension, hyperlipidemia and mild carotid disease. She had a Myoview stress test performed January 12, 2011, which was nonischemic. Carotid Dopplers performed August 27, 2011, showed moderate right and mild left ICA stenosis scheduled to be redone in February of next year. I last cath'd her January 09, 2008, revealing diffusely diseased graft to the PDA as well as diffuse PDA disease and I elected to treat her medically at that time. Her major complaints are nocturnal leg cramps. Since I saw her a year ago she has remained asymptomatic denying chest pain or shortness of breath.  She had a right total hip replacement by Dr. Alvan Potts in July 2019 which she has recuperated from nicely.  She is currently walking with a cane because of left hip pain.  Since I saw her a year ago she is remained stable.  She denies chest pain or shortness of breath.    Current Meds  Medication Sig  . Ascorbic Acid (VITAMIN C PO) Take 1 tablet by mouth 2 (two) times daily.   [name] aspirin EC 81 MG tablet Take 81 mg by mouth daily.  [name] atorvastatin (LIPITOR) 80 MG tablet Take 80 mg by mouth daily.  . Calcium-Magnesium-Vitamin D (CALCIUM MAGNESIUM PO) Take 1 tablet by mouth 2 (two) times daily.   . Cholecalciferol (VITAMIN D) 2000 units tablet Take 2,000 Units by mouth daily.  . Coenzyme Q10 (COQ10 PO) Take 1 capsule by mouth daily.  [name] ezetimibe (ZETIA) 10 MG tablet Take 1 tablet (10 mg total) by mouth daily.  .  furosemide (LASIX) 40 MG tablet Take 40 mg by mouth daily.   [name] GARLIC PO Take 1 tablet by mouth 2 (two) times daily.   . Glucosamine HCl (GLUCOSAMINE PO) Take 1 tablet by mouth daily.  [name] losartan-hydrochlorothiazide (HYZAAR) 50-12.5 MG tablet Take 1 tablet by mouth daily.  . Magnesium 250 MG TABS Take 250 mg by mouth daily.  . metoprolol tartrate (LOPRESSOR) 50 MG tablet Take 1 tablet (50 mg total) by mouth 2 (two) times daily.  . potassium chloride (K-DUR) 10 MEQ tablet TAKE 1 TABLET(10 MEQ) BY MOUTH DAILY     Allergies  Allergen Reactions  . Cymbalta [Duloxetine Hcl] Nausea Only    Social History   Socioeconomic History  . Marital status: Widowed    Spouse name: Not on file  . Number of children: 1  . Years of education: college  . Highest education level: Not on file  Occupational History  . Not on file  Social Needs  . Financial resource strain: Not on file  . Food insecurity    Worry: Not on file    Inability: Not on file  . Transportation needs    Medical: Not on file    Non-medical: Not on file  Tobacco Use  . Smoking status: Former Research scientist (life sciences)  . Smokeless tobacco: Never Used  . Tobacco comment: Quit 30 years ago  Substance and Sexual Activity  . Alcohol use: No  Alcohol/week: 0.0 standard drinks  . Drug use: No  . Sexual activity: Not Currently    Birth control/protection: Post-menopausal    Comment: intercourse age 69, more than 5 sexual partners  Lifestyle  . Physical activity    Days per week: Not on file    Minutes per session: Not on file  . Stress: Not on file  Relationships  . Social Herbalist on phone: Not on file    Gets together: Not on file    Attends religious service: Not on file    Active member of club or organization: Not on file    Attends meetings of clubs or organizations: Not on file    Relationship status: Not on file  . Intimate partner violence    Fear of current or ex partner: Not on file    Emotionally abused: Not on  file    Physically abused: Not on file    Forced sexual activity: Not on file  Other Topics Concern  . Not on file  Social History Narrative   Patient lives at home and her daughter and son in law live with her. Widowed.   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   Education college.   Left handed.   Caffeine       Review of Systems: General: negative for chills, fever, night sweats or weight changes.  Cardiovascular: negative for chest pain, dyspnea on exertion, edema, orthopnea, palpitations, paroxysmal nocturnal dyspnea or shortness of breath Dermatological: negative for rash Respiratory: negative for cough or wheezing Urologic: negative for hematuria Abdominal: negative for nausea, vomiting, diarrhea, bright red blood per rectum, melena, or hematemesis Neurologic: negative for visual changes, syncope, or dizziness All other systems reviewed and are otherwise negative except as noted above.    Blood pressure (!) 179/72, pulse 70, height 5\' 7"  (1.702 m), weight 194 lb 6.4 oz (88.2 kg), last menstrual period 07/16/2002, SpO2 100 %.  General appearance: alert and no distress Neck: no adenopathy, no carotid bruit, no JVD, supple, symmetrical, trachea midline and thyroid not enlarged, symmetric, no tenderness/mass/nodules Lungs: clear to auscultation bilaterally Heart: regular rate and rhythm, S1, S2 normal, no murmur, click, rub or gallop Extremities: extremities normal, atraumatic, no cyanosis or edema Pulses: 2+ and symmetric Skin: Skin color, texture, turgor normal. No rashes or lesions Neurologic: Alert and oriented X 3, normal strength and tone. Normal symmetric reflexes. Normal coordination and gait  EKG sinus rhythm at 70 with nonspecific ST and T wave changes.  I personally reviewed this EKG  ASSESSMENT AND PLAN:   Elevated cholesterol History of hyperlipidemia on high-dose atorvastatin and Zetia with lipid profile performed 06/24/2018 revealing total cholesterol 214, LDL  140 and HDL 55.  I am going to explore the possibility of starting Repatha as well.  Coronary artery disease History of CAD status post CABG [facility] November 2007.  I performed cardiac catheterization on 01/09/2008 revealing a diffusely diseased graft to the PDA as well as a diffusely diseased PDA I elected to treat her medically at that time.  She denies chest pain or shortness of breath.  Peripheral vascular disease:  Moderate right and mild left ICA stenosis. History of carotid artery disease with Doppler performed 04/22/2019 revealing moderate right and mild left ICA stenosis.  This will be repeated in 12 months  Essential hypertension History of essential hypertension with blood pressure measured today 179/72.  She is on metoprolol, losartan hydrochlorothiazide.  She says that she is in a lot  of pain from her left hip which may be contributing to her hypertension.  I am going to have her keep a blood pressure log daily for 30 days and return for review by one of our pharmacist to determine whether she needs titration and/or changes in her antihypertensive medications.      Tara Harp MD FACP,FACC,FAHA, [facility] 04/28/2019 2:27 PM

## 2019-04-28 NOTE — Assessment & Plan Note (Addendum)
History of CAD status post CABG [facility] November 2007.  I performed cardiac catheterization on 01/09/2008 revealing a diffusely diseased graft to the PDA as well as a diffusely diseased PDA I elected to treat her medically at that time.  She denies chest pain or shortness of breath.

## 2019-04-28 NOTE — Patient Instructions (Addendum)
Medication Instructions:  Your physician recommends that you continue on your current medications as directed. Please refer to the Current Medication list given to you today.  If you need a refill on your cardiac medications before your next appointment, please call your pharmacy.   Lab work: none If you have labs (blood work) drawn today and your tests are completely normal, you will receive your results only by: [name] MyChart Message (if you have MyChart) OR . A paper copy in the mail If you have any lab test that is abnormal or we need to change your treatment, we will call you to review the results.  Testing/Procedures: Your physician has requested that you have a carotid duplex. This test is an ultrasound of the carotid arteries in your neck. It looks at blood flow through these arteries that supply the brain with blood. Allow one hour for this exam. There are no restrictions or special instructions. DUE October 2021   Follow-Up: At [facility], you and your health needs are our priority.  As part of our continuing mission to provide you with exceptional heart care, we have created designated Provider Care Teams.  These Care Teams include your primary Cardiologist (physician) and Advanced Practice Providers (APPs -  Physician Assistants and Nurse Practitioners) who all work together to provide you with the care you need, when you need it. You will need a follow up appointment in 12 months with Dr. Quay Burow.  Please call our office 2 months in advance to schedule this/each appointment.    Any Other Special Instructions Will Be Listed Below (If Applicable). KEEP A BLOOD PRESSURE LOG FOR 30 DAYS THEN FOLLOW UP WITH A CLINICAL PHARMACIST IN THE HYPERTENSION CLINIC. YOU WILL NEED AN IN-PERSON APPOINTMENT IN THE OFFICE. PLEASE CALL TO SCHEDULE THIS IN-PERSON APPOINTMENT IF YOU DID NOT ALREADY DO SO DURING YOUR LAST VISIT AT [facility].  YOU WILL NEED AN APPOINTMENT IN THE  OFFICE WITH A CLINICAL PHARMACIST TO DISCUSS REPATHA, A MEDICATION THAT HELPS LOWER "BAD " CHOLESTEROL.   YOUR BLOOD PRESSURE FOLLOW UP AND REPATHA CONSULT WILL BE DONE SCHEDULED ON THE SAME DAY

## 2019-04-28 NOTE — Assessment & Plan Note (Signed)
History of hyperlipidemia on high-dose atorvastatin and Zetia with lipid profile performed 06/24/2018 revealing total cholesterol 214, LDL 140 and HDL 55.  I am going to explore the possibility of starting Repatha as well.

## 2019-04-28 NOTE — Assessment & Plan Note (Signed)
History of essential hypertension with blood pressure measured today 179/72.  She is on metoprolol, losartan hydrochlorothiazide.  She says that she is in a lot of pain from her left hip which may be contributing to her hypertension.  I am going to have her keep a blood pressure log daily for 30 days and return for review by one of our pharmacist to determine whether she needs titration and/or changes in her antihypertensive medications.

## 2019-04-28 NOTE — Assessment & Plan Note (Signed)
History of carotid artery disease with Doppler performed 04/22/2019 revealing moderate right and mild left ICA stenosis.  This will be repeated in 12 months

## 2019-05-01 ENCOUNTER — Other Ambulatory Visit (INDEPENDENT_AMBULATORY_CARE_PROVIDER_SITE_OTHER): Payer: Medicare Other

## 2019-05-01 DIAGNOSIS — I10 Essential (primary) hypertension: Secondary | ICD-10-CM

## 2019-05-01 DIAGNOSIS — I739 Peripheral vascular disease, unspecified: Secondary | ICD-10-CM

## 2019-05-01 DIAGNOSIS — E7800 Pure hypercholesterolemia, unspecified: Secondary | ICD-10-CM

## 2019-05-01 DIAGNOSIS — I2510 Atherosclerotic heart disease of native coronary artery without angina pectoris: Secondary | ICD-10-CM

## 2019-05-01 DIAGNOSIS — I6523 Occlusion and stenosis of bilateral carotid arteries: Secondary | ICD-10-CM

## 2019-05-04 ENCOUNTER — Other Ambulatory Visit: Payer: Self-pay | Admitting: Adult Health

## 2019-05-15 ENCOUNTER — Other Ambulatory Visit: Payer: Self-pay | Admitting: Pharmacist Clinician (PhC)/ Clinical Pharmacy Specialist

## 2019-05-15 DIAGNOSIS — I10 Essential (primary) hypertension: Secondary | ICD-10-CM

## 2019-05-15 DIAGNOSIS — I2510 Atherosclerotic heart disease of native coronary artery without angina pectoris: Secondary | ICD-10-CM

## 2019-05-21 DIAGNOSIS — I10 Essential (primary) hypertension: Secondary | ICD-10-CM | POA: Diagnosis not present

## 2019-05-21 DIAGNOSIS — I2510 Atherosclerotic heart disease of native coronary artery without angina pectoris: Secondary | ICD-10-CM | POA: Diagnosis not present

## 2019-05-22 ENCOUNTER — Telehealth: Payer: Self-pay | Admitting: *Deleted

## 2019-05-22 ENCOUNTER — Telehealth: Payer: Self-pay | Admitting: Pharmacist Clinician (PhC)/ Clinical Pharmacy Specialist

## 2019-05-22 ENCOUNTER — Encounter: Payer: Self-pay | Admitting: *Deleted

## 2019-05-22 DIAGNOSIS — E7800 Pure hypercholesterolemia, unspecified: Secondary | ICD-10-CM

## 2019-05-22 LAB — COMPREHENSIVE METABOLIC PANEL
ALT: 32 IU/L (ref 0–32)
AST: 44 IU/L — ABNORMAL HIGH (ref 0–40)
Albumin/Globulin Ratio: 1.1 — ABNORMAL LOW (ref 1.2–2.2)
Albumin: 4.2 g/dL (ref 3.6–4.6)
Alkaline Phosphatase: 86 IU/L (ref 39–117)
BUN/Creatinine Ratio: 16 (ref 12–28)
BUN: 22 mg/dL (ref 8–27)
Bilirubin Total: 0.5 mg/dL (ref 0.0–1.2)
CO2: 20 mmol/L (ref 20–29)
Calcium: 9.2 mg/dL (ref 8.7–10.3)
Chloride: 100 mmol/L (ref 96–106)
Creatinine, Ser: 1.38 mg/dL — ABNORMAL HIGH (ref 0.57–1.00)
GFR calc Af Amer: 41 mL/min/{1.73_m2} — ABNORMAL LOW (ref >59)
GFR calc non Af Amer: 35 mL/min/{1.73_m2} — ABNORMAL LOW (ref >59)
Globulin, Total: 3.7 g/dL (ref 1.5–4.5)
Glucose: 75 mg/dL (ref 65–99)
Potassium: 4.3 mmol/L (ref 3.5–5.2)
Sodium: 138 mmol/L (ref 134–144)
Total Protein: 7.9 g/dL (ref 6.0–8.5)

## 2019-05-22 LAB — HEPATIC FUNCTION PANEL
ALT: 33 IU/L — ABNORMAL HIGH (ref 0–32)
AST: 39 IU/L (ref 0–40)
Albumin: 4.2 g/dL (ref 3.6–4.6)
Alkaline Phosphatase: 89 IU/L (ref 39–117)
Bilirubin Total: 0.5 mg/dL (ref 0.0–1.2)
Bilirubin, Direct: 0.14 mg/dL (ref 0.00–0.40)
Total Protein: 8.1 g/dL (ref 6.0–8.5)

## 2019-05-22 LAB — LIPID PANEL
Chol/HDL Ratio: 3.8 ratio (ref 0.0–4.4)
Cholesterol, Total: 218 mg/dL — ABNORMAL HIGH (ref 100–199)
HDL: 57 mg/dL (ref >39)
LDL Chol Calc (NIH): 146 mg/dL — ABNORMAL HIGH (ref 0–99)
Triglycerides: 85 mg/dL (ref 0–149)
VLDL Cholesterol Cal: 15 mg/dL (ref 5–40)

## 2019-05-22 NOTE — Telephone Encounter (Signed)
LMOM for patient to return call.  LDL elevated at 146, Dr. Gwenlyn Found would like her to start [place].  Per chart we had CVRR visit in January, at which time pt stated she "does not do needles".  Need to determine if she still feels this way or consider Nexlizet as another option.

## 2019-05-22 NOTE — Telephone Encounter (Addendum)
Results released to my chart  Referral placed for lipid clinic with pharm md   ----- Message from Lorretta Harp, MD sent at 05/22/2019  1:23 PM EST ----- Not at goal for secondary prevention on high-dose atorvastatin and Zetia.  Refer to [facility] to initiate Repatha
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
## 2019-06-04 ENCOUNTER — Ambulatory Visit (INDEPENDENT_AMBULATORY_CARE_PROVIDER_SITE_OTHER): Payer: Medicare Other | Admitting: Neurology

## 2019-06-04 ENCOUNTER — Encounter: Payer: Self-pay | Admitting: Neurology

## 2019-06-04 ENCOUNTER — Other Ambulatory Visit: Payer: Self-pay

## 2019-06-04 VITALS — BP 156/59 | HR 59 | Temp 97.5°F | Ht 66.0 in | Wt 197.2 lb

## 2019-06-04 DIAGNOSIS — R269 Unspecified abnormalities of gait and mobility: Secondary | ICD-10-CM | POA: Diagnosis not present

## 2019-06-04 DIAGNOSIS — M5417 Radiculopathy, lumbosacral region: Secondary | ICD-10-CM

## 2019-06-04 DIAGNOSIS — R202 Paresthesia of skin: Secondary | ICD-10-CM | POA: Diagnosis not present

## 2019-06-04 DIAGNOSIS — I6523 Occlusion and stenosis of bilateral carotid arteries: Secondary | ICD-10-CM

## 2019-06-09 ENCOUNTER — Ambulatory Visit: Payer: Medicare Other

## 2019-06-09 NOTE — Progress Notes (Deleted)
Patient ID: Tara Potts                 DOB: 01/27/1934                      MRN: [id number]     HPI: Tara Potts is a 85 y.o. female referred by Dr. Gwenlyn Found to pharmacist clinic for Lipids and HTN management. PMH includes hyperlipidemia, CABG in 2007, mild carotid disease, and uncontrolled hypertension.   Current HTN meds:  Losartan/HCTZ 50-12.5mg  daily Metoprolol tartrate 50mg  twice daily  Current CHOL meds: Atorvastatin 80mg  daily Ezetimibe 10mg  daily  Intoletance:   BP goal: <130/80  LDL goal: 70mg /dL or 50% decrease form baseline  Family History: Mother died from MI around 42; Father died in his 17's more related to chronic illness/old age; Multiple siblings with no significant heart issues;  daughter - prefers holistic/alternative medicine  Social History: former smoker, denies alcohol use  Diet: not much fried food, eats out several times per week, likes chicken (baked) and occasional beef,  does not eat pork; plenty of fruits/vegetables; likes to snack on cookies or popcorn; avoids salt when can  Exercise:   Home BP readings:   Wt Readings from Last 3 Encounters:  06/04/19 197 lb 3.2 oz (89.4 kg)  04/28/19 194 lb 6.4 oz (88.2 kg)  09/01/18 191 lb 9.6 oz (86.9 kg)   BP Readings from Last 3 Encounters:  06/04/19 (!) 156/59  04/28/19 (!) 179/72  09/01/18 (!) 194/67   Pulse Readings from Last 3 Encounters:  06/04/19 (!) 59  04/28/19 70  09/01/18 80    Renal function: Estimated Creatinine Clearance: 34.8 mL/min (A) (by C-G formula based on SCr of 1.38 mg/dL (H)).  Past Medical History:  Diagnosis Date  . Anemia   . Arthritis   . Asthma    as a child  . CAD (coronary artery disease)   . Carotid disease, bilateral (HCC)    mild  . Fibroid   . Hx of CABG 2007   [facility]  . Hyperlipemia   . Hypertension   . Menopausal symptoms   . Peripheral neuropathy   . Seizure-like activity ([place])     Current Outpatient Medications on File Prior to Visit   Medication Sig Dispense Refill  . Ascorbic Acid (VITAMIN C PO) Take 1 tablet by mouth 2 (two) times daily.     [name] aspirin EC 81 MG tablet Take 81 mg by mouth daily.    [name] atorvastatin (LIPITOR) 80 MG tablet Take 80 mg by mouth daily.    . Calcium-Magnesium-Vitamin D (CALCIUM MAGNESIUM PO) Take 1 tablet by mouth 2 (two) times daily.     . Cholecalciferol (VITAMIN D) 2000 units tablet Take 2,000 Units by mouth daily.    . Coenzyme Q10 (COQ10 PO) Take 1 capsule by mouth daily.    [name] ezetimibe (ZETIA) 10 MG tablet Take 1 tablet (10 mg total) by mouth daily. 90 tablet 3  . furosemide (LASIX) 40 MG tablet Take 40 mg by mouth daily.   6  . GARLIC PO Take 1 tablet by mouth 2 (two) times daily.     . Glucosamine HCl (GLUCOSAMINE PO) Take 1 tablet by mouth daily.    [name] losartan-hydrochlorothiazide (HYZAAR) 50-12.5 MG tablet Take 1 tablet by mouth daily.  2  . Magnesium 250 MG TABS Take 250 mg by mouth daily.    . metoprolol tartrate (LOPRESSOR) 50 MG tablet TAKE 1 TABLET(50 MG) BY  MOUTH TWICE DAILY 180 tablet 3  . potassium chloride (K-DUR) 10 MEQ tablet TAKE 1 TABLET(10 MEQ) BY MOUTH DAILY 90 tablet 3   No current facility-administered medications on file prior to visit.     Allergies  Allergen Reactions  . Cymbalta [Duloxetine Hcl] Nausea Only    Last menstrual period 07/16/2002.  No problem-specific Assessment & Plan notes found for this encounter.    Tara Potts PharmD, BCPS, [place] [id number] 06/09/2019 12:39 PM

## 2019-06-10 IMAGING — DX DG PORTABLE PELVIS
1 series · 1 of 1 positions shown · non-contrast
Comparison: Radiographs dated 09/13/2016

CLINICAL DATA: Osteoarthritis of the right hip.

EXAM:
PORTABLE PELVIS 1-2 VIEWS

[pelvis ap]
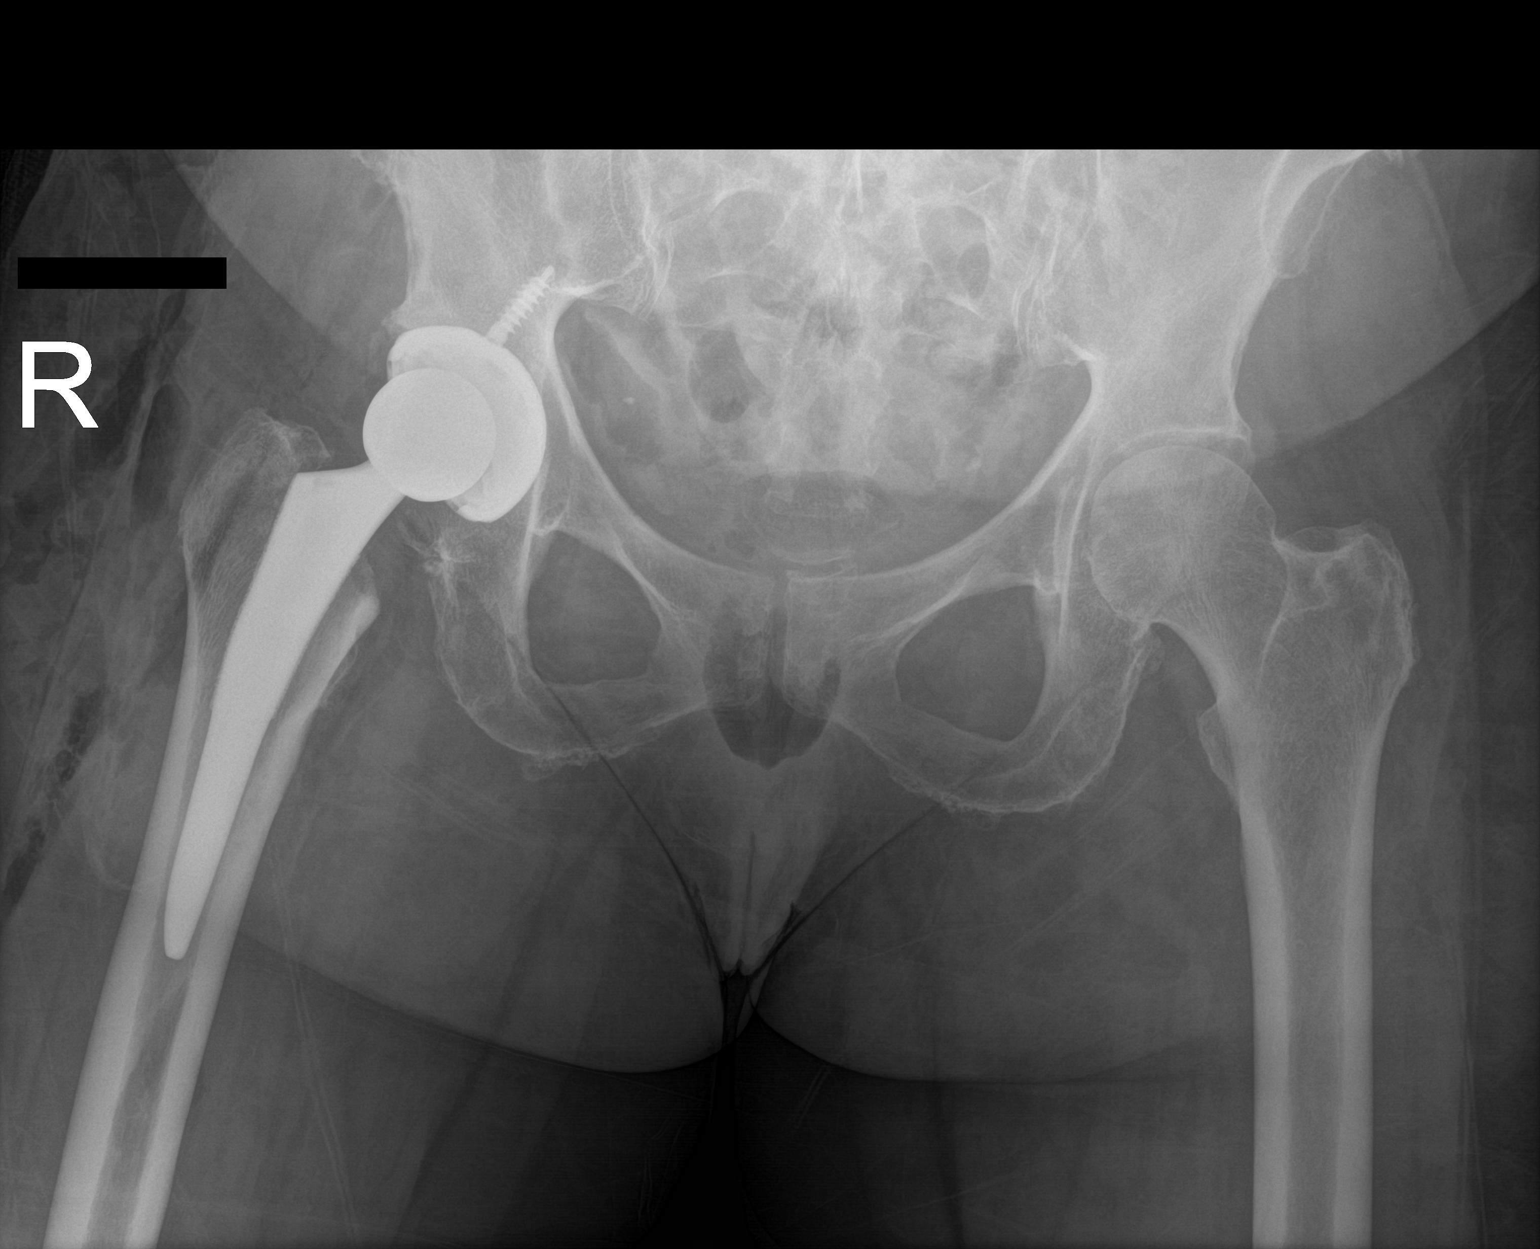

[1 of 1 positions shown; findings below may reference images not displayed]

FINDINGS: The patient has undergone right total hip prosthesis insertion. The
acetabular and femoral components appear in excellent position.
Visualized bones of the pelvis are normal. No significant
abnormality of the left hip joint.
IMPRESSION: Satisfactory appearance of the right hip in the AP projection after
total hip prosthesis insertion.

## 2019-06-17 ENCOUNTER — Other Ambulatory Visit: Payer: Self-pay

## 2019-06-17 ENCOUNTER — Ambulatory Visit: Payer: Medicare Other | Attending: Neurology | Admitting: Physical Therapy

## 2019-06-17 ENCOUNTER — Encounter: Payer: Self-pay | Admitting: Physical Therapy

## 2019-06-17 DIAGNOSIS — R2681 Unsteadiness on feet: Secondary | ICD-10-CM | POA: Insufficient documentation

## 2019-06-17 DIAGNOSIS — M79604 Pain in right leg: Secondary | ICD-10-CM | POA: Insufficient documentation

## 2019-06-17 DIAGNOSIS — R2689 Other abnormalities of gait and mobility: Secondary | ICD-10-CM | POA: Diagnosis not present

## 2019-06-17 DIAGNOSIS — M6281 Muscle weakness (generalized): Secondary | ICD-10-CM | POA: Insufficient documentation

## 2019-06-17 NOTE — Therapy (Signed)
[facility] [address] [place], [place], [address] Phone: [phone]   Fax:  [phone]  Physical Therapy Evaluation  Patient Details  Name: Tara Potts MRN: [id number] Date of Birth: 10-28-1933 Referring Provider (PT): Butler Denmark, NP   Encounter Date: 06/17/2019  PT End of Session - 06/17/19 2051    Visit Number  1    Number of Visits  17    Date for PT Re-Evaluation  09/15/19    Authorization Type  Medicare/BCBS    PT Start Time  1446    PT Stop Time  1531    PT Time Calculation (min)  45 min    Activity Tolerance  Patient tolerated treatment well    Behavior During Therapy  [facility] for tasks assessed/performed       Past Medical History:  Diagnosis Date  . Anemia   . Arthritis   . Asthma    as a child  . CAD (coronary artery disease)   . Carotid disease, bilateral (HCC)    mild  . Fibroid   . Hx of CABG 2007   [facility]  . Hyperlipemia   . Hypertension   . Menopausal symptoms   . Peripheral neuropathy   . Seizure-like activity [facility])     Past Surgical History:  Procedure Laterality Date  . CARDIAC CATHETERIZATION  01/09/2008   diffusely diseased graft to the PDA & diffuse PDA disease  . CAROTID DOPPLER  08/27/2011   mod. right & nild left ICA stenosis  . CATARACT SURGERY    . CORONARY ARTERY BYPASS GRAFT    . FOOT SURGERY    . JOINT REPLACEMENT     Right hip Dr. Alvan Dame 01-28-18   . MASS REMOVED FROM CERVIX  12/1997   Benign endocervical inclusion cysts  . NM MYOVIEW LTD  01/12/2011   no ischemia  . TOTAL HIP ARTHROPLASTY Right 01/28/2018   Procedure: RIGHT TOTAL HIP ARTHROPLASTY ANTERIOR APPROACH;  Surgeon: Paralee Cancel, MD;  Location: [facility];  Service: Orthopedics;  Laterality: Right;  70 mins  . TRIPLE HEART BYPASS  05/2006   [facility]  . US ECHOCARDIOGRAPHY  10/30/2007   mild MA,MR,TR,AOV mildly sclerotic,density oted in the prox asc aorta    There were no vitals filed for  this visit.   Subjective Assessment - 06/17/19 1449    Subjective  Basically, it's my walking that is bothering me.  My feet cramp and swell alot.  I feel like my R leg wants to turn in, so I try to correct with walking wider.  My back hurts, not anywhere else like it used to.  Uses cane for gait and has used since THR.  Can walk without the cane, but I don't feel comfortable.  No falls.    Pertinent History  PMH chronic LBP radiating into R hip and R leg; R THR 01/2018, anemia, arthritis, CABG, HTN,peripheral neuropathy, multilevel cervical degenerative disk disease, central canal stenosis, L DDD    Patient Stated Goals  Pt's goals for therapy are to stop hurting in back and walk better.    Currently in Pain?  Yes   No pain at beginning of PT eval   Pain Score  --   Pt has no rated pain c/o in back during eval; not hurting right now   Pain Location  Back    Pain Orientation  Right;Lower    Pain Descriptors / Indicators  Aching    Pain Type  Chronic  pain    Pain Onset  More than a month ago    Pain Frequency  Intermittent    Aggravating Factors   walking, getting out of bed    Pain Relieving Factors  sitting, resting    Effect of Pain on Daily Activities  PT will monitor pain         [facility] PT Assessment - 06/17/19 1458      Assessment   Medical Diagnosis  Lumbosacral radiculopathy, gait changes    Referring Provider (PT)  Butler Denmark, NP    Onset Date/Surgical Date  06/04/19   MD viist     Precautions   Precautions  Fall      Balance Screen   Has the patient fallen in the past 6 months  No    Has the patient had a decrease in activity level because of a fear of falling?   Yes    Is the patient reluctant to leave their home because of a fear of falling?   No      Home Environment   Living Environment  Private residence    Living Arrangements  Children   daughter lives with her   Type of [place] to enter    Entrance Stairs-Number of Steps  2     Entrance Stairs-Rails  Right    Home Layout  Two level    Alternate Level Stairs-Number of Steps  [place] - single point      Prior Function   Level of Independence  Independent    Vocation Requirements  Taught dance    Leisure  Enjoys dancing      Posture/Postural Control   Posture/Postural Control  No significant limitations      ROM / Strength   AROM / PROM / Strength  Strength      Strength   Overall Strength  Deficits    Strength Assessment Site  Hip;Knee;Ankle    Right/Left Hip  Right;Left    Right Hip Flexion  3+/5    Right Hip ABduction  3-/5    Left Hip Flexion  4/5    Left Hip ABduction  3-/5    Right/Left Knee  Right;Left    Right Knee Flexion  4/5    Right Knee Extension  4/5    Left Knee Flexion  4/5    Left Knee Extension  4/5    Right/Left Ankle  Right;Left    Right Ankle Dorsiflexion  5/5    Left Ankle Dorsiflexion  5/5      Transfers   Transfers  Sit to Stand;Stand to Sit    Sit to Stand  6: Modified independent (Device/Increase time);Without upper extremity assist;From chair/3-in-1    Five time sit to stand comments   24.59    Stand to Sit  6: Modified independent (Device/Increase time);Without upper extremity assist;To chair/3-in-1    Comments  Uncontrolled descent into sitting several times during eval (not during 5x sit<>stand)      Ambulation/Gait   Ambulation/Gait  Yes    Ambulation/Gait Assistance  6: Modified independent (Device/Increase time)    Ambulation Distance (Feet)  150 Feet    Assistive device  Straight cane    Gait Pattern  Step-through pattern;Poor foot clearance - left;Poor foot clearance - right;Trendelenburg    Ambulation Surface  Level;Indoor    Gait velocity  21.85 sec = 1.5 ft/sec  Standardized Balance Assessment   Standardized Balance Assessment  Timed Up and Go Test      Timed Up and Go Test   Normal TUG (seconds)  29.5    TUG Comments  No device; scores >13.5 sec indicate increased fall risk; >30  seconds indicate difficulty with ADLs in the home      High Level Balance   High Level Balance Comments  SLS:  1 sec; tandem with RLE in front:  12.69 sec; LLE in front 9.97 sec                Objective measurements completed on examination: See above findings.                PT Short Term Goals - 06/17/19 2104      PT SHORT TERM GOAL #1   Title  The patient will be indep with HEP for strengthening, balance, and gait.  TARGET 4 weeks, 07/09/2019    Time  4    Period  Weeks    Status  New    Target Date  07/09/19      PT SHORT TERM GOAL #2   Title  Pt will improve TUG score to less than or equal to 25 seconds for decreased fall risk.    Time  4    Period  Weeks    Status  New    Target Date  07/09/19      PT SHORT TERM GOAL #3   Title  Pt will improve 5x sit<>stand to less than or equal to 20 seconds for improved functional lower extremity strength.    Time  4    Period  Weeks    Status  New    Target Date  07/09/19      PT SHORT TERM GOAL #4   Title  Pt will verbalize understanding of fall prevention in home environment.    Time  4    Period  Weeks    Status  New    Target Date  07/09/19        PT Long Term Goals - 06/17/19 2107      PT LONG TERM GOAL #1   Title  The patient will be indep with progression of HEP.  TARGET 8 weeks, 08/07/2019    Time  8    Period  Weeks    Status  New    Target Date  08/07/19      PT LONG TERM GOAL #2   Title  Pt will improve TUG score to less than or equal to 20 seconds for decreased fall risk.    Time  8    Period  Weeks    Status  New    Target Date  08/07/19      PT LONG TERM GOAL #3   Title  Pt will improve 5x sit<>stand to less than or equal to 17 seconds for improved functional strength.    Time  8    Period  Weeks    Status  New    Target Date  08/07/19      PT LONG TERM GOAL #4   Title  Pt will improve gait velocity 1.8 ft/sec for improved gait efficiency and decreased fall risk.    Time   8    Period  Weeks    Status  New    Target Date  08/07/19      PT LONG TERM GOAL #5   Title  Pt will verbalize understanding of posture/postiioning with ADLs to decrease back pain with ADLs and functional activities.    Time  8    Period  Weeks    Status  New    Target Date  08/07/19             Plan - 06/17/19 2059    Clinical Impression Statement  Pt is an 85 yo female who presents to [facility] with history of low back pain, with changes in gait pattern since her [facility] 01/2018.  She feels offbalance when not using cane; she has not had any falls.  Pt presents with decreased balance, decreased functional strength, decreased gait velocity (limited community ambulator).  She is at fall risk per TUG and gait velocity scores.  She is active and wants to be able to walk without fear of falling.  She would benefit from skilled PT to address the above stated deficits to decrease fall risk and improve functional mobility.    Personal Factors and Comorbidities  Comorbidity 3+    Comorbidities  PMH chronic LBP radiating into R hip and R leg; R THR 01/2018, anemia, arthritis, CABG, HTN,peripheral neuropathy, multilevel cervical degenerative disk disease, central canal stenosis, L DDD    Examination-Activity Limitations  Locomotion Level;Transfers    Examination-Participation Restrictions  Community Activity    Stability/Clinical Decision Making  Evolving/Moderate complexity    Clinical Decision Making  Moderate    Rehab Potential  Good    PT Frequency  2x / week    PT Duration  8 weeks   including eval week   PT Treatment/Interventions  ADLs/Self Care Home Management;DME Instruction;Gait training;Functional mobility training;Therapeutic activities;Therapeutic exercise;Balance training;Neuromuscular re-education;Patient/family education;Manual techniques;Electrical Stimulation;Moist Heat    PT Next Visit Plan  Initiate HEP to address hip stregthening, SLS and tandem stance; check leg length; further  assess back pain if warranted    Consulted and Agree with Plan of Care  Patient       Patient will benefit from skilled therapeutic intervention in order to improve the following deficits and impairments:  Abnormal gait, Difficulty walking, Decreased balance, Decreased mobility, Decreased strength, Pain  Visit Diagnosis: Other abnormalities of gait and mobility  Muscle weakness (generalized)  Unsteadiness on feet     Problem List Patient Active Problem List   Diagnosis Date Noted  . Multiple myeloma ([place]) 05/26/2018  . Goals of care, counseling/discussion 05/26/2018  . Obese 01/29/2018  . S/P right THA, AA 01/28/2018  . S/P hip replacement 01/28/2018  . Carotid artery disease ([place]) 04/12/2017  . Passed out 10/05/2015  . Lumbosacral radiculopathy 09/10/2014  . Cervical stenosis of spinal canal 09/10/2014  . Abnormality of gait 07/27/2014  . Paresthesia 07/27/2014  . Bilateral leg cramps 02/21/2014  . Coronary artery disease 01/13/2013  . Status post coronary artery bypass grafting: 2007 01/13/2013  . Peripheral vascular disease:  Moderate right and mild left ICA stenosis. 01/13/2013  . Essential hypertension 01/13/2013  . Elevated cholesterol   . Menopause 02/12/2011  . Leiomyoma of body of uterus 02/12/2011  . Post-menopausal atrophic vaginitis 02/12/2011  . Urinary frequency 02/12/2011  . UNSPECIFIED PERIPHERAL VASCULAR DISEASE 07/11/2010    Frazier Butt. 06/17/2019, 9:18 PM Frazier Butt., PT  [facility] [address] [place], [place], [address] Phone: [phone]   Fax:  [phone]  Name: Tara Potts MRN: [id number] Date of Birth: [birth year]

## 2019-06-19 ENCOUNTER — Encounter: Payer: Self-pay | Admitting: Physical Therapy

## 2019-06-19 ENCOUNTER — Ambulatory Visit: Payer: Medicare Other | Admitting: Physical Therapy

## 2019-06-19 ENCOUNTER — Other Ambulatory Visit: Payer: Self-pay

## 2019-06-19 DIAGNOSIS — R2689 Other abnormalities of gait and mobility: Secondary | ICD-10-CM

## 2019-06-19 DIAGNOSIS — M6281 Muscle weakness (generalized): Secondary | ICD-10-CM

## 2019-06-19 NOTE — Patient Instructions (Addendum)
HIP: Hamstrings - Short Sitting    Rest leg on raised surface or the floor. Keep knee straight. Lift chest and lean forward until you feel the gentle stretch. Hold _15-30_ seconds. _3__ reps per set, __1-2_ sets per day  Copyright  VHI. All rights reserved.  Hip Abduction / Adduction: with Knee Flexion (Supine)    With *BOTH*  knees bent, and band around knees, gently lower right knee to side and return. Repeat __5-10__ times per set. Do __1-2__ sets per session. Repeat with left leg  http://orth.exer.us/683   Copyright  VHI. All rights reserved.

## 2019-06-20 NOTE — Therapy (Signed)
[facility] [address] [place], [place], [address] Phone: [phone]   Fax:  [phone]  Physical Therapy Treatment  Patient Details  Name: Tara Potts MRN: [id number] Date of Birth: 11-21-1933 Referring Provider (PT): Butler Denmark, NP   Encounter Date: 06/19/2019  PT End of Session - 06/20/19 1911    Visit Number  2    Number of Visits  17    Date for PT Re-Evaluation  09/15/19    Authorization Type  Medicare/BCBS    PT Start Time  1318    PT Stop Time  1403    PT Time Calculation (min)  45 min    Equipment Utilized During Treatment  --   use of R heel wedge   Activity Tolerance  Patient tolerated treatment well    Behavior During Therapy  [facility] for tasks assessed/performed       Past Medical History:  Diagnosis Date  . Anemia   . Arthritis   . Asthma    as a child  . CAD (coronary artery disease)   . Carotid disease, bilateral (HCC)    mild  . Fibroid   . Hx of CABG 2007   [facility]  . Hyperlipemia   . Hypertension   . Menopausal symptoms   . Peripheral neuropathy   . Seizure-like activity [facility])     Past Surgical History:  Procedure Laterality Date  . CARDIAC CATHETERIZATION  01/09/2008   diffusely diseased graft to the PDA & diffuse PDA disease  . CAROTID DOPPLER  08/27/2011   mod. right & nild left ICA stenosis  . CATARACT SURGERY    . CORONARY ARTERY BYPASS GRAFT    . FOOT SURGERY    . JOINT REPLACEMENT     Right hip Dr. Alvan Dame 01-28-18   . MASS REMOVED FROM CERVIX  12/1997   Benign endocervical inclusion cysts  . NM MYOVIEW LTD  01/12/2011   no ischemia  . TOTAL HIP ARTHROPLASTY Right 01/28/2018   Procedure: RIGHT TOTAL HIP ARTHROPLASTY ANTERIOR APPROACH;  Surgeon: Paralee Cancel, MD;  Location: [facility];  Service: Orthopedics;  Laterality: Right;  70 mins  . TRIPLE HEART BYPASS  05/2006   [facility]  . US ECHOCARDIOGRAPHY  10/30/2007   mild MA,MR,TR,AOV mildly sclerotic,density  oted in the prox asc aorta    There were no vitals filed for this visit.  Subjective Assessment - 06/19/19 1321    Subjective  Didn't feel as strong, so I put a back brace on today.    Pertinent History  PMH chronic LBP radiating into R hip and R leg; R THR 01/2018, anemia, arthritis, CABG, HTN,peripheral neuropathy, multilevel cervical degenerative disk disease, central canal stenosis, L DDD    Patient Stated Goals  Pt's goals for therapy are to stop hurting in back and walk better.    Currently in Pain?  No/denies    Pain Onset  More than a month ago                       [facility] Adult PT Treatment/Exercise - 06/20/19 1903      Ambulation/Gait   Ambulation/Gait  Yes    Ambulation/Gait Assistance  6: Modified independent (Device/Increase time)    Ambulation Distance (Feet)  100 Feet   x 4 reps   Assistive device  Straight cane    Gait Pattern  Step-through pattern;Poor foot clearance - left;Poor foot clearance - right;Trendelenburg    Ambulation  Surface  Level;Indoor    Pre-Gait Activities  With addition of R heel wedge, she demonstrates decreased Trendelenburg pattern RLE.      Gait Comments  Based on leg length assessment, added 3/8" heel wedge to RLE.  Pt notes immediate improvement of RLE foot clearance and ease of consistent step through pattern.  When wedge removed, pt notes discomfort with gait again.  She requests and therapist agrees to keep wedge in R shoe (as long as she does not have any back/hip discomfort).      Exercises   Exercises  Lumbar;Knee/Hip      Lumbar Exercises: Stretches   Active Hamstring Stretch  Right;2 reps;30 seconds   Seated, foot propped on floor   Passive Hamstring Stretch  Right;Left;2 reps   15 seconds using belt in supine   Single Knee to Chest Stretch  Right;Left;2 reps;10 seconds   using belt for ease of stretch   Lower Trunk Rotation  3 reps;10 seconds   Gentle rocking side to side, then stretch   Pelvic Tilt  5 reps       Knee/Hip Exercises: Supine   Bridges  Strengthening;Both;1 set;5 reps   Pt experiences cramping in L hamstrings   Other Supine Knee/Hip Exercises  Supine clamshell, one leg at a time, x 5 reps; 2nd set with red theraband      Knee/Hip Exercises: Sidelying   Clams  Bilateral, x 5 reps, no resistance; tactile cues on RLE to avoid rolling posterior through hips       Leg length measure:  After 3 reps of bridging>legs extended in supine, RLE appears shorter than LLE at plantar aspect of feet.  Measured ASIS>medial malleolus:  RLE 90.5 cm; LLE 91 cm  Wedge trimmed to fit into pt's shoe      PT Education - 06/20/19 1911    Education Details  HEP initiated; use of R heel wedge    Person(s) Educated  Patient    Methods  Explanation;Demonstration;Handout    Comprehension  Verbalized understanding;Returned demonstration;Verbal cues required       PT Short Term Goals - 06/17/19 2104      PT SHORT TERM GOAL #1   Title  The patient will be indep with HEP for strengthening, balance, and gait.  TARGET 4 weeks, 07/09/2019    Time  4    Period  Weeks    Status  New    Target Date  07/09/19      PT SHORT TERM GOAL #2   Title  Pt will improve TUG score to less than or equal to 25 seconds for decreased fall risk.    Time  4    Period  Weeks    Status  New    Target Date  07/09/19      PT SHORT TERM GOAL #3   Title  Pt will improve 5x sit<>stand to less than or equal to 20 seconds for improved functional lower extremity strength.    Time  4    Period  Weeks    Status  New    Target Date  07/09/19      PT SHORT TERM GOAL #4   Title  Pt will verbalize understanding of fall prevention in home environment.    Time  4    Period  Weeks    Status  New    Target Date  07/09/19        PT Long Term Goals - 06/17/19 2107  PT LONG TERM GOAL #1   Title  The patient will be indep with progression of HEP.  TARGET 8 weeks, 08/07/2019    Time  8    Period  Weeks    Status  New     Target Date  08/07/19      PT LONG TERM GOAL #2   Title  Pt will improve TUG score to less than or equal to 20 seconds for decreased fall risk.    Time  8    Period  Weeks    Status  New    Target Date  08/07/19      PT LONG TERM GOAL #3   Title  Pt will improve 5x sit<>stand to less than or equal to 17 seconds for improved functional strength.    Time  8    Period  Weeks    Status  New    Target Date  08/07/19      PT LONG TERM GOAL #4   Title  Pt will improve gait velocity 1.8 ft/sec for improved gait efficiency and decreased fall risk.    Time  8    Period  Weeks    Status  New    Target Date  08/07/19      PT LONG TERM GOAL #5   Title  Pt will verbalize understanding of posture/postiioning with ADLs to decrease back pain with ADLs and functional activities.    Time  8    Period  Weeks    Status  New    Target Date  08/07/19            Plan - 06/20/19 1912    Clinical Impression Statement  Initiated HEP to address hip stregnthening and lower extremity flexibility as well as low back flexibility.  Pt does not have any c/o pain in back throughout session.  Assessed leg length difference, with pt appearing to have RLE approx 0.5 cm shorter than LLE.  When wedge is inserted into R shoe, pt notes immediate relief/improved gait pattern; PT notes improvement in less lateral hip drop on RLE and more fluid step through pattern.  Pt will continue to benefit from skilled PT to address hip stregnth, lower extremity flexibility, gait and balance.    Personal Factors and Comorbidities  Comorbidity 3+    Comorbidities  PMH chronic LBP radiating into R hip and R leg; R THR 01/2018, anemia, arthritis, CABG, HTN,peripheral neuropathy, multilevel cervical degenerative disk disease, central canal stenosis, L DDD    Examination-Activity Limitations  Locomotion Level;Transfers    Examination-Participation Restrictions  Community Activity    Stability/Clinical Decision Making  Evolving/Moderate  complexity    Rehab Potential  Good    PT Frequency  2x / week    PT Duration  8 weeks   including eval week   PT Treatment/Interventions  ADLs/Self Care Home Management;DME Instruction;Gait training;Functional mobility training;Therapeutic activities;Therapeutic exercise;Balance training;Neuromuscular re-education;Patient/family education;Manual techniques;Electrical Stimulation;Moist Heat    PT Next Visit Plan  Review HEP; check how heel insert did for patient since last visit; continue hip strengthening, giat and balance training; add to HEP as needed    Consulted and Agree with Plan of Care  Patient       Patient will benefit from skilled therapeutic intervention in order to improve the following deficits and impairments:  Abnormal gait, Difficulty walking, Decreased balance, Decreased mobility, Decreased strength, Pain  Visit Diagnosis: Muscle weakness (generalized)  Other abnormalities of gait and mobility  Problem List Patient Active Problem List   Diagnosis Date Noted  . Multiple myeloma ([place]) 05/26/2018  . Goals of care, counseling/discussion 05/26/2018  . Obese 01/29/2018  . S/P right THA, AA 01/28/2018  . S/P hip replacement 01/28/2018  . Carotid artery disease ([place]) 04/12/2017  . Passed out 10/05/2015  . Lumbosacral radiculopathy 09/10/2014  . Cervical stenosis of spinal canal 09/10/2014  . Abnormality of gait 07/27/2014  . Paresthesia 07/27/2014  . Bilateral leg cramps 02/21/2014  . Coronary artery disease 01/13/2013  . Status post coronary artery bypass grafting: 2007 01/13/2013  . Peripheral vascular disease:  Moderate right and mild left ICA stenosis. 01/13/2013  . Essential hypertension 01/13/2013  . Elevated cholesterol   . Menopause 02/12/2011  . Leiomyoma of body of uterus 02/12/2011  . Post-menopausal atrophic vaginitis 02/12/2011  . Urinary frequency 02/12/2011  . UNSPECIFIED PERIPHERAL VASCULAR DISEASE 07/11/2010    Frazier Butt. 06/20/2019,  7:17 PM  Frazier Butt., PT   [place] [address] [place], [place], [address] Phone: [phone]   Fax:  [phone]  Name: Tara Potts MRN: [id number] Date of Birth: 1933-11-29

## 2019-06-23 ENCOUNTER — Encounter: Payer: Self-pay | Admitting: Physical Therapy

## 2019-06-23 ENCOUNTER — Ambulatory Visit: Payer: Medicare Other | Admitting: Physical Therapy

## 2019-06-23 ENCOUNTER — Other Ambulatory Visit: Payer: Self-pay

## 2019-06-23 DIAGNOSIS — R2689 Other abnormalities of gait and mobility: Secondary | ICD-10-CM

## 2019-06-23 DIAGNOSIS — M6281 Muscle weakness (generalized): Secondary | ICD-10-CM

## 2019-06-23 DIAGNOSIS — R2681 Unsteadiness on feet: Secondary | ICD-10-CM

## 2019-06-23 NOTE — Patient Instructions (Signed)
Access Code: [email or web address]  URL: [email or web address]  Date: 06/23/2019  Prepared by: Willow Ora   Exercises Seated Hamstring Stretch - 3 reps - 1 sets - 30 hold - 1x daily - 5x weekly Hooklying Single Leg Bent Knee Fallouts with Resistance - 10 reps - 1 sets - 1x daily - 5x weekly Single Knee to Chest Stretch - 3 reps - 1 sets - 30 hold - 1x daily - 5x weekly Supine Lower Trunk Rotation - 3 reps - 1 sets - 30 hold - 1x daily - 5x weekly Sit to Stand with Armchair - 10 reps - 1 sets - 1x daily - 5x weekly

## 2019-06-23 NOTE — Therapy (Signed)
[place] [address] [place], [place], [address] Phone: [phone]   Fax:  [phone]  Physical Therapy Treatment  Patient Details  Name: Tara Potts MRN: [id number] Date of Birth: August 31, 1933 Referring Provider (PT): Butler Denmark, NP   Encounter Date: 06/23/2019  PT End of Session - 06/23/19 0807    Visit Number  3    Number of Visits  17    Date for PT Re-Evaluation  09/15/19    Authorization Type  Medicare/BCBS    PT Start Time  0803    PT Stop Time  0844    PT Time Calculation (min)  41 min    Equipment Utilized During Treatment  --   use of R heel wedge   Activity Tolerance  Patient tolerated treatment well    Behavior During Therapy  [facility] for tasks assessed/performed       Past Medical History:  Diagnosis Date  . Anemia   . Arthritis   . Asthma    as a child  . CAD (coronary artery disease)   . Carotid disease, bilateral (HCC)    mild  . Fibroid   . Hx of CABG 2007   [facility]  . Hyperlipemia   . Hypertension   . Menopausal symptoms   . Peripheral neuropathy   . Seizure-like activity [facility])     Past Surgical History:  Procedure Laterality Date  . CARDIAC CATHETERIZATION  01/09/2008   diffusely diseased graft to the PDA & diffuse PDA disease  . CAROTID DOPPLER  08/27/2011   mod. right & nild left ICA stenosis  . CATARACT SURGERY    . CORONARY ARTERY BYPASS GRAFT    . FOOT SURGERY    . JOINT REPLACEMENT     Right hip Dr. Alvan Dame 01-28-18   . MASS REMOVED FROM CERVIX  12/1997   Benign endocervical inclusion cysts  . NM MYOVIEW LTD  01/12/2011   no ischemia  . TOTAL HIP ARTHROPLASTY Right 01/28/2018   Procedure: RIGHT TOTAL HIP ARTHROPLASTY ANTERIOR APPROACH;  Surgeon: Paralee Cancel, MD;  Location: [facility];  Service: Orthopedics;  Laterality: Right;  70 mins  . TRIPLE HEART BYPASS  05/2006   [facility]  . US ECHOCARDIOGRAPHY  10/30/2007   mild MA,MR,TR,AOV mildly sclerotic,density  oted in the prox asc aorta    There were no vitals filed for this visit.  Subjective Assessment - 06/23/19 0806    Subjective  No new complaints.  No falls or pain to report. Still using the heel lift, feels it is helping.    Pertinent History  PMH chronic LBP radiating into R hip and R leg; R THR 01/2018, anemia, arthritis, CABG, HTN,peripheral neuropathy, multilevel cervical degenerative disk disease, central canal stenosis, L DDD    Patient Stated Goals  Pt's goals for therapy are to stop hurting in back and walk better.    Currently in Pain?  No/denies    Pain Score  0-No pain         [facility] Adult PT Treatment/Exercise - 06/23/19 6283      Ambulation/Gait   Ambulation/Gait  Yes    Ambulation/Gait Assistance  6: Modified independent (Device/Increase time)    Ambulation Distance (Feet)  --   around gym with session   Assistive device  Straight cane    Gait Pattern  Step-through pattern;Poor foot clearance - left;Poor foot clearance - right;Trendelenburg    Ambulation Surface  Level;Indoor      Exercises  Exercises  Other Exercises    Other Exercises   Reviewed HEP issued at last session with cues on technique/form. added new ex's to HEP. Refer to [facility] program fo full details.  Attempted bridges, however pt with complaints of hamstring cramping, unable to perform over 1 rep. Stretching out limb relieved the cramping.      Knee/Hip Exercises: Aerobic   Other Aerobic  Scifit level 2.5 with UE/LE for 8 minutes with goal >/=50 rpm for strengthening and activity tolerance.        issued the following to HEP today: Access Code: [email or web address]  URL: [email or web address]  Date: 06/23/2019  Prepared by: Willow Ora   Exercises Seated Hamstring Stretch - 3 reps - 1 sets - 30 hold - 1x daily - 5x weekly Hooklying Single Leg Bent Knee Fallouts with Resistance - 10 reps - 1 sets - 1x daily - 5x weekly Single Knee to Chest Stretch - 3 reps - 1 sets - 30 hold - 1x daily - 5x  weekly Supine Lower Trunk Rotation - 3 reps - 1 sets - 30 hold - 1x daily - 5x weekly Sit to Stand with Armchair - 10 reps - 1 sets - 1x daily - 5x weekly      PT Education - 06/23/19 0839    Education Details  review and additions to HEP    Person(s) Educated  Patient    Methods  Explanation;Demonstration;Verbal cues    Comprehension  Verbalized understanding;Returned demonstration;Verbal cues required;Tactile cues required;Need further instruction       PT Short Term Goals - 06/17/19 2104      PT SHORT TERM GOAL #1   Title  The patient will be indep with HEP for strengthening, balance, and gait.  TARGET 4 weeks, 07/09/2019    Time  4    Period  Weeks    Status  New    Target Date  07/09/19      PT SHORT TERM GOAL #2   Title  Pt will improve TUG score to less than or equal to 25 seconds for decreased fall risk.    Time  4    Period  Weeks    Status  New    Target Date  07/09/19      PT SHORT TERM GOAL #3   Title  Pt will improve 5x sit<>stand to less than or equal to 20 seconds for improved functional lower extremity strength.    Time  4    Period  Weeks    Status  New    Target Date  07/09/19      PT SHORT TERM GOAL #4   Title  Pt will verbalize understanding of fall prevention in home environment.    Time  4    Period  Weeks    Status  New    Target Date  07/09/19        PT Long Term Goals - 06/17/19 2107      PT LONG TERM GOAL #1   Title  The patient will be indep with progression of HEP.  TARGET 8 weeks, 08/07/2019    Time  8    Period  Weeks    Status  New    Target Date  08/07/19      PT LONG TERM GOAL #2   Title  Pt will improve TUG score to less than or equal to 20 seconds for decreased fall risk.    Time  8    Period  Weeks    Status  New    Target Date  08/07/19      PT LONG TERM GOAL #3   Title  Pt will improve 5x sit<>stand to less than or equal to 17 seconds for improved functional strength.    Time  8    Period  Weeks    Status  New     Target Date  08/07/19      PT LONG TERM GOAL #4   Title  Pt will improve gait velocity 1.8 ft/sec for improved gait efficiency and decreased fall risk.    Time  8    Period  Weeks    Status  New    Target Date  08/07/19      PT LONG TERM GOAL #5   Title  Pt will verbalize understanding of posture/postiioning with ADLs to decrease back pain with ADLs and functional activities.    Time  8    Period  Weeks    Status  New    Target Date  08/07/19            Plan - 06/23/19 0807    Clinical Impression Statement  Today's skilled session continued to focus on strengthening. Reviewed and added to HEP with no issues reported with performance in session today.    Personal Factors and Comorbidities  Comorbidity 3+    Comorbidities  PMH chronic LBP radiating into R hip and R leg; R THR 01/2018, anemia, arthritis, CABG, HTN,peripheral neuropathy, multilevel cervical degenerative disk disease, central canal stenosis, L DDD    Examination-Activity Limitations  Locomotion Level;Transfers    Examination-Participation Restrictions  Community Activity    Stability/Clinical Decision Making  Evolving/Moderate complexity    Rehab Potential  Good    PT Frequency  2x / week    PT Duration  8 weeks   including eval week   PT Treatment/Interventions  ADLs/Self Care Home Management;DME Instruction;Gait training;Functional mobility training;Therapeutic activities;Therapeutic exercise;Balance training;Neuromuscular re-education;Patient/family education;Manual techniques;Electrical Stimulation;Moist Heat    PT Next Visit Plan  continue hip strengthening, giat and balance training; add to HEP as needed    PT Home Exercise Plan  Access Code: [email or web address]    Consulted and Agree with Plan of Care  Patient       Patient will benefit from skilled therapeutic intervention in order to improve the following deficits and impairments:  Abnormal gait, Difficulty walking, Decreased balance, Decreased mobility, Decreased  strength, Pain  Visit Diagnosis: Other abnormalities of gait and mobility  Unsteadiness on feet  Muscle weakness (generalized)     Problem List Patient Active Problem List   Diagnosis Date Noted  . Multiple myeloma ([place]) 05/26/2018  . Goals of care, counseling/discussion 05/26/2018  . Obese 01/29/2018  . S/P right THA, AA 01/28/2018  . S/P hip replacement 01/28/2018  . Carotid artery disease ([place]) 04/12/2017  . Passed out 10/05/2015  . Lumbosacral radiculopathy 09/10/2014  . Cervical stenosis of spinal canal 09/10/2014  . Abnormality of gait 07/27/2014  . Paresthesia 07/27/2014  . Bilateral leg cramps 02/21/2014  . Coronary artery disease 01/13/2013  . Status post coronary artery bypass grafting: 2007 01/13/2013  . Peripheral vascular disease:  Moderate right and mild left ICA stenosis. 01/13/2013  . Essential hypertension 01/13/2013  . Elevated cholesterol   . Menopause 02/12/2011  . Leiomyoma of body of uterus 02/12/2011  . Post-menopausal atrophic vaginitis 02/12/2011  . Urinary frequency 02/12/2011  . UNSPECIFIED PERIPHERAL VASCULAR DISEASE 07/11/2010    Willow Ora, PTA,  [place] [address] [place], [place] [address] [phone] 06/23/19, 2:27 PM   Name: Tara Potts MRN: [id number] Date of Birth: 1934-04-18

## 2019-06-25 ENCOUNTER — Ambulatory Visit: Payer: Medicare Other | Admitting: Physical Therapy

## 2019-06-25 ENCOUNTER — Other Ambulatory Visit: Payer: Self-pay

## 2019-06-25 DIAGNOSIS — R2689 Other abnormalities of gait and mobility: Secondary | ICD-10-CM | POA: Diagnosis not present

## 2019-06-25 DIAGNOSIS — M6281 Muscle weakness (generalized): Secondary | ICD-10-CM

## 2019-06-25 NOTE — Patient Instructions (Signed)
WALKING  Walking is a great form of exercise to increase your strength, endurance and overall fitness.  A walking program can help you start slowly and gradually build endurance as you go.  Everyone's ability is different, so each person's starting point will be different.  You do not have to follow them exactly.  The are just samples. You should simply find out what's right for you and stick to that program.   In the beginning, you'll start off walking 2-3 times a day for short distances.  As you get stronger, you'll be walking further at just 1-2 times per day.  A. You Can Walk For A Certain Length Of Time Each Day    Walk 3 minutes 3 times per day.  Increase 1-2 minutes every 4 days (3 times per day).  Work up to 10-1minutes (1-2 times per day).   Example:   Day 1-2 3 minutes 3 times per day   Day 7-8 6-8 minutes 2-3 times per day   Day 13-14 10-12 minutes 1-2 times per day

## 2019-06-25 NOTE — Therapy (Addendum)
[place] [address] [place], [place], [address] Phone: [phone]   Fax:  [phone]  Physical Therapy Treatment  Patient Details  Name: Tara Potts MRN: [id number] Date of Birth: 02/27/1934 Referring Provider (PT): Butler Denmark, NP   Encounter Date: 06/25/2019  PT End of Session - 06/25/19 1037    Visit Number  4   created additional number in error-should be visit 4   Number of Visits  17    Date for PT Re-Evaluation  09/15/19    Authorization Type  Medicare/BCBS    PT Start Time  0850    PT Stop Time  0930    PT Time Calculation (min)  40 min    Equipment Utilized During Treatment  --   use of R heel wedge   Activity Tolerance  Patient tolerated treatment well    Behavior During Therapy  [facility] for tasks assessed/performed       Past Medical History:  Diagnosis Date  . Anemia   . Arthritis   . Asthma    as a child  . CAD (coronary artery disease)   . Carotid disease, bilateral (HCC)    mild  . Fibroid   . Hx of CABG 2007   [facility]  . Hyperlipemia   . Hypertension   . Menopausal symptoms   . Peripheral neuropathy   . Seizure-like activity [facility])     Past Surgical History:  Procedure Laterality Date  . CARDIAC CATHETERIZATION  01/09/2008   diffusely diseased graft to the PDA & diffuse PDA disease  . CAROTID DOPPLER  08/27/2011   mod. right & nild left ICA stenosis  . CATARACT SURGERY    . CORONARY ARTERY BYPASS GRAFT    . FOOT SURGERY    . JOINT REPLACEMENT     Right hip Dr. Alvan Dame 01-28-18   . MASS REMOVED FROM CERVIX  12/1997   Benign endocervical inclusion cysts  . NM MYOVIEW LTD  01/12/2011   no ischemia  . TOTAL HIP ARTHROPLASTY Right 01/28/2018   Procedure: RIGHT TOTAL HIP ARTHROPLASTY ANTERIOR APPROACH;  Surgeon: Paralee Cancel, MD;  Location: [facility];  Service: Orthopedics;  Laterality: Right;  70 mins  . TRIPLE HEART BYPASS  05/2006   [facility]  . US ECHOCARDIOGRAPHY   10/30/2007   mild MA,MR,TR,AOV mildly sclerotic,density oted in the prox asc aorta    There were no vitals filed for this visit.  Subjective Assessment - 06/25/19 0853    Subjective  No changes, no falls or pain.    Pertinent History  PMH chronic LBP radiating into R hip and R leg; R THR 01/2018, anemia, arthritis, CABG, HTN,peripheral neuropathy, multilevel cervical degenerative disk disease, central canal stenosis, L DDD    Patient Stated Goals  Pt's goals for therapy are to stop hurting in back and walk better.    Currently in Pain?  No/denies                       [facility] Adult PT Treatment/Exercise - 06/25/19 0854      Ambulation/Gait   Ambulation/Gait  Yes    Ambulation/Gait Assistance  6: Modified independent (Device/Increase time)    Ambulation Distance (Feet)  60 Feet   cane; 40 ft x 2 no cane   Assistive device  Straight cane;None   wearing R heel wedge   Gait Pattern  Step-through pattern;Trendelenburg;Narrow base of support    Ambulation Surface  Level;Indoor  Pre-Gait Activities  Discussed walking program for home and provided progression for walking program as part of HEP    Gait Comments  40 ft x 6 reps with cane      Exercises   Other Exercises   Reviewed HEP from last visit, with pt return demo understanding.      Lumbar Exercises: Stretches   Single Knee to Chest Stretch  Right;Left;30 seconds;2 reps    Lower Trunk Rotation  3 reps;30 seconds      Knee/Hip Exercises: Stretches   Active Hamstring Stretch  Right;Left;3 reps;30 seconds      Knee/Hip Exercises: Aerobic   Other Aerobic  Scifit level 2.5 with 4 extremities for 6 minutes, for lower extremity flexibility and strengthening.  Pt initially at 38 RPM and is able to increase to 75 RPM for last 2-3 minutes, with initial cues for increased intensity.       Knee/Hip Exercises: Seated   Sit to Sand  1 set;5 reps;without UE support   Pt reports performing at home from bed height     Knee/Hip  Exercises: Supine   Other Supine Knee/Hip Exercises  Supine clamshell, one leg at a time, x 10 reps, red theraband      Knee/Hip Exercises: Sidelying   Clams  RLE, 2 sets x 10 reps; cues between reps to keep hips stacked             PT Education - 06/25/19 1036    Education Details  Walking program for [employer]    Person(s) Educated  Patient    Methods  Explanation;Demonstration;Handout    Comprehension  Verbalized understanding;Returned demonstration       PT Short Term Goals - 06/17/19 2104      PT SHORT TERM GOAL #1   Title  The patient will be indep with HEP for strengthening, balance, and gait.  TARGET 4 weeks, 07/09/2019    Time  4    Period  Weeks    Status  New    Target Date  07/09/19      PT SHORT TERM GOAL #2   Title  Pt will improve TUG score to less than or equal to 25 seconds for decreased fall risk.    Time  4    Period  Weeks    Status  New    Target Date  07/09/19      PT SHORT TERM GOAL #3   Title  Pt will improve 5x sit<>stand to less than or equal to 20 seconds for improved functional lower extremity strength.    Time  4    Period  Weeks    Status  New    Target Date  07/09/19      PT SHORT TERM GOAL #4   Title  Pt will verbalize understanding of fall prevention in home environment.    Time  4    Period  Weeks    Status  New    Target Date  07/09/19        PT Long Term Goals - 06/17/19 2107      PT LONG TERM GOAL #1   Title  The patient will be indep with progression of HEP.  TARGET 8 weeks, 08/07/2019    Time  8    Period  Weeks    Status  New    Target Date  08/07/19      PT LONG TERM GOAL #2   Title  Pt will improve TUG score  to less than or equal to 20 seconds for decreased fall risk.    Time  8    Period  Weeks    Status  New    Target Date  08/07/19      PT LONG TERM GOAL #3   Title  Pt will improve 5x sit<>stand to less than or equal to 17 seconds for improved functional strength.    Time  8    Period   Weeks    Status  New    Target Date  08/07/19      PT LONG TERM GOAL #4   Title  Pt will improve gait velocity 1.8 ft/sec for improved gait efficiency and decreased fall risk.    Time  8    Period  Weeks    Status  New    Target Date  08/07/19      PT LONG TERM GOAL #5   Title  Pt will verbalize understanding of posture/postiioning with ADLs to decrease back pain with ADLs and functional activities.    Time  8    Period  Weeks    Status  New    Target Date  08/07/19            Plan - 06/25/19 1037    Clinical Impression Statement  Fully reviewed pt's HEP with pt return demo understanding.  Worked on sidelying hip strenghtening as well as use of aerobic machines for improved lower extremity strength and flexibility.  Added walking program for home for imrpoved gait endurance and strength.  Advised pt to use cane with walking program at home (versus no device).  Pt will continue to beneift from skilled PT to further address strength, gait and balance.    Personal Factors and Comorbidities  Comorbidity 3+    Comorbidities  PMH chronic LBP radiating into R hip and R leg; R THR 01/2018, anemia, arthritis, CABG, HTN,peripheral neuropathy, multilevel cervical degenerative disk disease, central canal stenosis, L DDD    Examination-Activity Limitations  Locomotion Level;Transfers    Examination-Participation Restrictions  Community Activity    Stability/Clinical Decision Making  Evolving/Moderate complexity    Rehab Potential  Good    PT Frequency  2x / week    PT Duration  8 weeks   including eval week   PT Treatment/Interventions  ADLs/Self Care Home Management;DME Instruction;Gait training;Functional mobility training;Therapeutic activities;Therapeutic exercise;Balance training;Neuromuscular re-education;Patient/family education;Manual techniques;Electrical Stimulation;Moist Heat    PT Next Visit Plan  How did walking program go at home?  Work on standing hip strengthening and balance  exercises at counter    PT Home Exercise Plan  Access Code: [email or web address]    [email or web address] and Agree with Plan of Care  Patient       Patient will benefit from skilled therapeutic intervention in order to improve the following deficits and impairments:  Abnormal gait, Difficulty walking, Decreased balance, Decreased mobility, Decreased strength, Pain  Visit Diagnosis: Muscle weakness (generalized)  Other abnormalities of gait and mobility     Problem List Patient Active Problem List   Diagnosis Date Noted  . Multiple myeloma ([place]) 05/26/2018  . Goals of care, counseling/discussion 05/26/2018  . Obese 01/29/2018  . S/P right THA, AA 01/28/2018  . S/P hip replacement 01/28/2018  . Carotid artery disease ([place]) 04/12/2017  . Passed out 10/05/2015  . Lumbosacral radiculopathy 09/10/2014  . Cervical stenosis of spinal canal 09/10/2014  . Abnormality of gait 07/27/2014  . Paresthesia 07/27/2014  . Bilateral leg cramps 02/21/2014  .  Coronary artery disease 01/13/2013  . Status post coronary artery bypass grafting: 2007 01/13/2013  . Peripheral vascular disease:  Moderate right and mild left ICA stenosis. 01/13/2013  . Essential hypertension 01/13/2013  . Elevated cholesterol   . Menopause 02/12/2011  . Leiomyoma of body of uterus 02/12/2011  . Post-menopausal atrophic vaginitis 02/12/2011  . Urinary frequency 02/12/2011  . UNSPECIFIED PERIPHERAL VASCULAR DISEASE 07/11/2010    Frazier Butt. 06/25/2019, 10:50 AM  Frazier Butt., PT   [place] [address] [place], [place], [address] Phone: [phone]   Fax:  [phone]  Name: Tara Potts MRN: [id number] Date of Birth: 10-Sep-1933

## 2019-06-30 ENCOUNTER — Encounter: Payer: Self-pay | Admitting: Physical Therapy

## 2019-06-30 ENCOUNTER — Other Ambulatory Visit: Payer: Self-pay

## 2019-06-30 ENCOUNTER — Ambulatory Visit: Payer: Medicare Other | Admitting: Physical Therapy

## 2019-06-30 DIAGNOSIS — R2689 Other abnormalities of gait and mobility: Secondary | ICD-10-CM | POA: Diagnosis not present

## 2019-06-30 DIAGNOSIS — R2681 Unsteadiness on feet: Secondary | ICD-10-CM

## 2019-06-30 DIAGNOSIS — M79604 Pain in right leg: Secondary | ICD-10-CM

## 2019-06-30 DIAGNOSIS — M6281 Muscle weakness (generalized): Secondary | ICD-10-CM

## 2019-06-30 NOTE — Therapy (Signed)
[facility] [address] [place], [place], [address] Phone: [phone]   Fax:  [phone]  Physical Therapy Treatment  Patient Details  Name: Tara Potts MRN: [id number] Date of Birth: 02-Apr-1934 Referring Provider (PT): Butler Denmark, NP   Encounter Date: 06/30/2019  PT End of Session - 06/30/19 0938    Visit Number  5    Number of Visits  17    Date for PT Re-Evaluation  09/15/19    Authorization Type  Medicare/BCBS    PT Start Time  0935    PT Stop Time  1015    PT Time Calculation (min)  40 min    Equipment Utilized During Treatment  Gait belt   use of R heel wedge   Activity Tolerance  Patient tolerated treatment well;No increased pain    Behavior During Therapy  WFL for tasks assessed/performed       Past Medical History:  Diagnosis Date  . Anemia   . Arthritis   . Asthma    as a child  . CAD (coronary artery disease)   . Carotid disease, bilateral (HCC)    mild  . Fibroid   . Hx of CABG 2007   [facility]  . Hyperlipemia   . Hypertension   . Menopausal symptoms   . Peripheral neuropathy   . Seizure-like activity [facility])     Past Surgical History:  Procedure Laterality Date  . CARDIAC CATHETERIZATION  01/09/2008   diffusely diseased graft to the PDA & diffuse PDA disease  . CAROTID DOPPLER  08/27/2011   mod. right & nild left ICA stenosis  . CATARACT SURGERY    . CORONARY ARTERY BYPASS GRAFT    . FOOT SURGERY    . JOINT REPLACEMENT     Right hip Dr. Alvan Dame 01-28-18   . MASS REMOVED FROM CERVIX  12/1997   Benign endocervical inclusion cysts  . NM MYOVIEW LTD  01/12/2011   no ischemia  . TOTAL HIP ARTHROPLASTY Right 01/28/2018   Procedure: RIGHT TOTAL HIP ARTHROPLASTY ANTERIOR APPROACH;  Surgeon: Paralee Cancel, MD;  Location: [facility];  Service: Orthopedics;  Laterality: Right;  70 mins  . TRIPLE HEART BYPASS  05/2006   [facility]  . US ECHOCARDIOGRAPHY  10/30/2007   mild  MA,MR,TR,AOV mildly sclerotic,density oted in the prox asc aorta    There were no vitals filed for this visit.  Subjective Assessment - 06/30/19 0937    Subjective  No new complaints. No falls or pain to report. Still using heel lift with no issues.    Pertinent History  PMH chronic LBP radiating into R hip and R leg; R THR 01/2018, anemia, arthritis, CABG, HTN,peripheral neuropathy, multilevel cervical degenerative disk disease, central canal stenosis, L DDD    Patient Stated Goals  Pt's goals for therapy are to stop hurting in back and walk better.    Currently in Pain?  No/denies    Pain Score  0-No pain             [facility] Adult PT Treatment/Exercise - 06/30/19 0938      Transfers   Transfers  Sit to Stand;Stand to Sit    Sit to Stand  6: Modified independent (Device/Increase time);Without upper extremity assist;From chair/3-in-1    Stand to Sit  6: Modified independent (Device/Increase time);Without upper extremity assist;To chair/3-in-1      Ambulation/Gait   Ambulation/Gait  Yes    Ambulation/Gait Assistance  6: Modified independent (Device/Increase time);5:  Supervision    Ambulation/Gait Assistance Details  supervision with no AD with gait due to trendelenburg gait pattern and instability     Ambulation Distance (Feet)  50 Feet   x1 with cane; around gym no AD   Assistive device  Straight cane;None    Gait Pattern  Step-through pattern;Trendelenburg;Narrow base of support    Ambulation Surface  Level;Indoor      High Level Balance   High Level Balance Activities  Side stepping;Marching forwards;Marching backwards;Tandem walking   tandem/toe walking fwd/bwd   High Level Balance Comments  on blue mat in parallel bars: 3 laps each with light UE support on bars with cues on posture, ex form/technique and weight shifting.       Neuro Re-ed    Neuro Re-ed Details   for balance/muscle re-ed/strengthening: on airex with no UE support-feet hip width apart for EC no head  movements for 30 seconds x 3 reps, progressing to EC head movments left<>right, then up<>down for 10 reps each, min guard to min assist for balance; seated with feet on airex- sit<>stands x 10 reps with UE assist, min guard assit.       Knee/Hip Exercises: Aerobic   Other Aerobic  Scifit UE/LE level 3.0 for 8 minutes with goal >/=65 rpm for strengthing and activity tolerance.       Knee/Hip Exercises: Standing   Lateral Step Up  Both;1 set;10 reps;Hand Hold: 2;Step Height: 6";Limitations    Lateral Step Up Limitations  cues on form and technique    Forward Step Up  Both;1 set;10 reps;Hand Hold: 2;Step Height: 6";Limitations    Forward Step Up Limitations  cues on form and technique               PT Short Term Goals - 06/17/19 2104      PT SHORT TERM GOAL #1   Title  The patient will be indep with HEP for strengthening, balance, and gait.  TARGET 4 weeks, 07/09/2019    Time  4    Period  Weeks    Status  New    Target Date  07/09/19      PT SHORT TERM GOAL #2   Title  Pt will improve TUG score to less than or equal to 25 seconds for decreased fall risk.    Time  4    Period  Weeks    Status  New    Target Date  07/09/19      PT SHORT TERM GOAL #3   Title  Pt will improve 5x sit<>stand to less than or equal to 20 seconds for improved functional lower extremity strength.    Time  4    Period  Weeks    Status  New    Target Date  07/09/19      PT SHORT TERM GOAL #4   Title  Pt will verbalize understanding of fall prevention in home environment.    Time  4    Period  Weeks    Status  New    Target Date  07/09/19        PT Long Term Goals - 06/17/19 2107      PT LONG TERM GOAL #1   Title  The patient will be indep with progression of HEP.  TARGET 8 weeks, 08/07/2019    Time  8    Period  Weeks    Status  New    Target Date  08/07/19      PT  LONG TERM GOAL #2   Title  Pt will improve TUG score to less than or equal to 20 seconds for decreased fall risk.     Time  8    Period  Weeks    Status  New    Target Date  08/07/19      PT LONG TERM GOAL #3   Title  Pt will improve 5x sit<>stand to less than or equal to 17 seconds for improved functional strength.    Time  8    Period  Weeks    Status  New    Target Date  08/07/19      PT LONG TERM GOAL #4   Title  Pt will improve gait velocity 1.8 ft/sec for improved gait efficiency and decreased fall risk.    Time  8    Period  Weeks    Status  New    Target Date  08/07/19      PT LONG TERM GOAL #5   Title  Pt will verbalize understanding of posture/postiioning with ADLs to decrease back pain with ADLs and functional activities.    Time  8    Period  Weeks    Status  New    Target Date  08/07/19            Plan - 06/30/19 1962    Clinical Impression Statement  Today's skilled session continued to focus on strengthening and balance reactions with rest breaks needed due to fatigue, no pain repored with session. The pt is making steady progress toward goals and should benefit from continued PT to progress toward unmet goals.    Personal Factors and Comorbidities  Comorbidity 3+    Comorbidities  PMH chronic LBP radiating into R hip and R leg; R THR 01/2018, anemia, arthritis, CABG, HTN,peripheral neuropathy, multilevel cervical degenerative disk disease, central canal stenosis, L DDD    Examination-Activity Limitations  Locomotion Level;Transfers    Examination-Participation Restrictions  Community Activity    Stability/Clinical Decision Making  Evolving/Moderate complexity    Rehab Potential  Good    PT Frequency  2x / week    PT Duration  8 weeks   including eval week   PT Treatment/Interventions  ADLs/Self Care Home Management;DME Instruction;Gait training;Functional mobility training;Therapeutic activities;Therapeutic exercise;Balance training;Neuromuscular re-education;Patient/family education;Manual techniques;Electrical Stimulation;Moist Heat    PT Next Visit Plan  Work on  standing hip strengthening and balance exercises with support as needed    PT Home Exercise Plan  Access Code: [email or web address]    Consulted and Agree with Plan of Care  Patient       Patient will benefit from skilled therapeutic intervention in order to improve the following deficits and impairments:  Abnormal gait, Difficulty walking, Decreased balance, Decreased mobility, Decreased strength, Pain  Visit Diagnosis: Muscle weakness (generalized)  Other abnormalities of gait and mobility  Unsteadiness on feet  Pain in right leg     Problem List Patient Active Problem List   Diagnosis Date Noted  . Multiple myeloma ([place]) 05/26/2018  . Goals of care, counseling/discussion 05/26/2018  . Obese 01/29/2018  . S/P right THA, AA 01/28/2018  . S/P hip replacement 01/28/2018  . Carotid artery disease ([place]) 04/12/2017  . Passed out 10/05/2015  . Lumbosacral radiculopathy 09/10/2014  . Cervical stenosis of spinal canal 09/10/2014  . Abnormality of gait 07/27/2014  . Paresthesia 07/27/2014  . Bilateral leg cramps 02/21/2014  . Coronary artery disease 01/13/2013  . Status post coronary artery bypass grafting: 2007  01/13/2013  . Peripheral vascular disease:  Moderate right and mild left ICA stenosis. 01/13/2013  . Essential hypertension 01/13/2013  . Elevated cholesterol   . Menopause 02/12/2011  . Leiomyoma of body of uterus 02/12/2011  . Post-menopausal atrophic vaginitis 02/12/2011  . Urinary frequency 02/12/2011  . UNSPECIFIED PERIPHERAL VASCULAR DISEASE 07/11/2010    Willow Ora, PTA, [facility] [address], [place], [place] [address] [phone] 06/30/19, 6:44 PM   Name: Tara Potts MRN: [id number] Date of Birth: Sep 29, 1933

## 2019-07-01 NOTE — Progress Notes (Signed)
I have reviewed and agreed above plan. 

## 2019-07-02 ENCOUNTER — Other Ambulatory Visit: Payer: Self-pay

## 2019-07-02 ENCOUNTER — Encounter: Payer: Self-pay | Admitting: Physical Therapy

## 2019-07-02 ENCOUNTER — Ambulatory Visit: Payer: Medicare Other | Admitting: Physical Therapy

## 2019-07-02 DIAGNOSIS — R2689 Other abnormalities of gait and mobility: Secondary | ICD-10-CM | POA: Diagnosis not present

## 2019-07-02 DIAGNOSIS — M6281 Muscle weakness (generalized): Secondary | ICD-10-CM

## 2019-07-02 NOTE — Therapy (Signed)
[facility] [address] [place], [place], [address] Phone: [phone]   Fax:  [phone]  Physical Therapy Treatment  Patient Details  Name: Tara Potts MRN: [id number] Date of Birth: 1934-01-26 Referring Provider (PT): Butler Denmark, NP   Encounter Date: 07/02/2019  PT End of Session - 07/02/19 0943    Visit Number  6    Number of Visits  17    Date for PT Re-Evaluation  09/15/19    Authorization Type  Medicare/BCBS    PT Start Time  0848    PT Stop Time  0930    PT Time Calculation (min)  42 min    Equipment Utilized During Treatment  Gait belt   use of R heel wedge   Activity Tolerance  Patient tolerated treatment well;No increased pain   Pt fatigued by end of session   Behavior During Therapy  [facility] for tasks assessed/performed       Past Medical History:  Diagnosis Date  . Anemia   . Arthritis   . Asthma    as a child  . CAD (coronary artery disease)   . Carotid disease, bilateral (HCC)    mild  . Fibroid   . Hx of CABG 2007   [facility]  . Hyperlipemia   . Hypertension   . Menopausal symptoms   . Peripheral neuropathy   . Seizure-like activity [facility])     Past Surgical History:  Procedure Laterality Date  . CARDIAC CATHETERIZATION  01/09/2008   diffusely diseased graft to the PDA & diffuse PDA disease  . CAROTID DOPPLER  08/27/2011   mod. right & nild left ICA stenosis  . CATARACT SURGERY    . CORONARY ARTERY BYPASS GRAFT    . FOOT SURGERY    . JOINT REPLACEMENT     Right hip Dr. Alvan Dame 01-28-18   . MASS REMOVED FROM CERVIX  12/1997   Benign endocervical inclusion cysts  . NM MYOVIEW LTD  01/12/2011   no ischemia  . TOTAL HIP ARTHROPLASTY Right 01/28/2018   Procedure: RIGHT TOTAL HIP ARTHROPLASTY ANTERIOR APPROACH;  Surgeon: Paralee Cancel, MD;  Location: [facility];  Service: Orthopedics;  Laterality: Right;  70 mins  . TRIPLE HEART BYPASS  05/2006   [facility]  . US  ECHOCARDIOGRAPHY  10/30/2007   mild MA,MR,TR,AOV mildly sclerotic,density oted in the prox asc aorta    There were no vitals filed for this visit.  Subjective Assessment - 07/02/19 0850    Subjective  No changes, nothing new.    Pertinent History  PMH chronic LBP radiating into R hip and R leg; R THR 01/2018, anemia, arthritis, CABG, HTN,peripheral neuropathy, multilevel cervical degenerative disk disease, central canal stenosis, L DDD    Patient Stated Goals  Pt's goals for therapy are to stop hurting in back and walk better.    Currently in Pain?  No/denies                       [facility] Adult PT Treatment/Exercise - 07/02/19 0001      Transfers   Transfers  Sit to Stand;Stand to Sit    Sit to Stand  6: Modified independent (Device/Increase time);Without upper extremity assist;From chair/3-in-1;From bed    Stand to Sit  6: Modified independent (Device/Increase time);Without upper extremity assist;To chair/3-in-1;To bed    Number of Reps  2 sets;Other reps (comment)   5 reps     Ambulation/Gait   Ambulation/Gait  Assistance  5: Supervision    Ambulation/Gait Assistance Details  Pt did not bring cane into therapy today.  Gait training without cane during session    Ambulation Distance (Feet)  80 Feet   20 ft x 4 reps, 115 ft x 2; 80 ft (several standing breaks)   Assistive device  None    Gait Pattern  Step-through pattern;Trendelenburg;Narrow base of support;Poor foot clearance - right    Ambulation Surface  Level;Indoor    Gait Comments  PT provides cues with gait for looking ahead for upright posture, for deliberate foot clearance/ heelstrike on RLE, and tactile/verbal cues for reciprocal arm swing.      High Level Balance   High Level Balance Activities  Side stepping;Backward walking   Along counter, 4 reps each     Exercises   Exercises  Knee/Hip;Ankle      Lumbar Exercises: Stretches   Insurance claims handler Limitations  --      Knee/Hip  Exercises: Clinical research associate  Right;3 reps;30 seconds    Gastroc Stretch Limitations  using belt, foot propped in sitting position      Knee/Hip Exercises: Standing   Lateral Step Up  Both;1 set;10 reps;Hand Hold: 2;Step Height: 6";Limitations    Lateral Step Up Limitations  Cues for form and technique    Forward Step Up  Both;1 set;10 reps;Hand Hold: 2;Step Height: 6";Limitations    Forward Step Up Limitations  Cues for widened BOS upon return to ground    Other Standing Knee Exercises  Single limb stance:  alternating step taps x 10 reps with UE support, at 6" step, 12" step; then consecutive step taps, 10 reps each RLE and LLE with UE support      Ankle Exercises: Standing   Heel Raises  Both;10 reps;3 seconds    Toe Raise  10 reps;3 seconds    Other Standing Ankle Exercises  Stagger Stance forward/back rocking, to work on single leg ankle plantarflexion/dorsiflexion 2 sets x 10 reps    Other Standing Ankle Exercises  Forward step and weigthshift, focus on heelstrike, 10 reps each side      Ankle Exercises: Seated   Heel Raises  Right;Left;10 reps;3 seconds    Heel Raises Limitations  cues to slowly lower for eccentric control    Toe Raise  10 reps;3 seconds    Toe Raise Limitations  Cues to slowly lower for eccentric control               PT Short Term Goals - 06/17/19 2104      PT SHORT TERM GOAL #1   Title  The patient will be indep with HEP for strengthening, balance, and gait.  TARGET 4 weeks, 07/09/2019    Time  4    Period  Weeks    Status  New    Target Date  07/09/19      PT SHORT TERM GOAL #2   Title  Pt will improve TUG score to less than or equal to 25 seconds for decreased fall risk.    Time  4    Period  Weeks    Status  New    Target Date  07/09/19      PT SHORT TERM GOAL #3   Title  Pt will improve 5x sit<>stand to less than or equal to 20 seconds for improved functional lower extremity strength.    Time  4    Period  Weeks  Status   New    Target Date  07/09/19      PT SHORT TERM GOAL #4   Title  Pt will verbalize understanding of fall prevention in home environment.    Time  4    Period  Weeks    Status  New    Target Date  07/09/19        PT Long Term Goals - 06/17/19 2107      PT LONG TERM GOAL #1   Title  The patient will be indep with progression of HEP.  TARGET 8 weeks, 08/07/2019    Time  8    Period  Weeks    Status  New    Target Date  08/07/19      PT LONG TERM GOAL #2   Title  Pt will improve TUG score to less than or equal to 20 seconds for decreased fall risk.    Time  8    Period  Weeks    Status  New    Target Date  08/07/19      PT LONG TERM GOAL #3   Title  Pt will improve 5x sit<>stand to less than or equal to 17 seconds for improved functional strength.    Time  8    Period  Weeks    Status  New    Target Date  08/07/19      PT LONG TERM GOAL #4   Title  Pt will improve gait velocity 1.8 ft/sec for improved gait efficiency and decreased fall risk.    Time  8    Period  Weeks    Status  New    Target Date  08/07/19      PT LONG TERM GOAL #5   Title  Pt will verbalize understanding of posture/postiioning with ADLs to decrease back pain with ADLs and functional activities.    Time  8    Period  Weeks    Status  New    Target Date  08/07/19            Plan - 07/02/19 0944    Clinical Impression Statement  Focus of skilled PT session today is on functional lower extremity strengthening, especially through hips and lower leg.  Pt does not have cane today; gait training performed without cane today.  Pt has increased fatigue at end of session, needing several brief standing rest breaks and Trendelenburg pattern on RLE.  Advised pt that cane is optimal for improved stability and safety with gait.    Personal Factors and Comorbidities  Comorbidity 3+    Comorbidities  PMH chronic LBP radiating into R hip and R leg; R THR 01/2018, anemia, arthritis, CABG, HTN,peripheral  neuropathy, multilevel cervical degenerative disk disease, central canal stenosis, L DDD    Examination-Activity Limitations  Locomotion Level;Transfers    Examination-Participation Restrictions  Community Activity    Stability/Clinical Decision Making  Evolving/Moderate complexity    Rehab Potential  Good    PT Frequency  2x / week    PT Duration  8 weeks   including eval week   PT Treatment/Interventions  ADLs/Self Care Home Management;DME Instruction;Gait training;Functional mobility training;Therapeutic activities;Therapeutic exercise;Balance training;Neuromuscular re-education;Patient/family education;Manual techniques;Electrical Stimulation;Moist Heat    PT Next Visit Plan  Check STGs next week (check about appts); work on standing hip, lower leg strengthening, balance (add to HEP as needed)    PT Home Exercise Plan  Access Code: [email or web address]    Consulted and Agree  with Plan of Care  Patient       Patient will benefit from skilled therapeutic intervention in order to improve the following deficits and impairments:  Abnormal gait, Difficulty walking, Decreased balance, Decreased mobility, Decreased strength, Pain  Visit Diagnosis: Muscle weakness (generalized)  Other abnormalities of gait and mobility     Problem List Patient Active Problem List   Diagnosis Date Noted  . Multiple myeloma ([place]) 05/26/2018  . Goals of care, counseling/discussion 05/26/2018  . Obese 01/29/2018  . S/P right THA, AA 01/28/2018  . S/P hip replacement 01/28/2018  . Carotid artery disease ([place]) 04/12/2017  . Passed out 10/05/2015  . Lumbosacral radiculopathy 09/10/2014  . Cervical stenosis of spinal canal 09/10/2014  . Abnormality of gait 07/27/2014  . Paresthesia 07/27/2014  . Bilateral leg cramps 02/21/2014  . Coronary artery disease 01/13/2013  . Status post coronary artery bypass grafting: 2007 01/13/2013  . Peripheral vascular disease:  Moderate right and mild left ICA stenosis. 01/13/2013  .  Essential hypertension 01/13/2013  . Elevated cholesterol   . Menopause 02/12/2011  . Leiomyoma of body of uterus 02/12/2011  . Post-menopausal atrophic vaginitis 02/12/2011  . Urinary frequency 02/12/2011  . UNSPECIFIED PERIPHERAL VASCULAR DISEASE 07/11/2010    Frazier Butt. 07/02/2019, 9:47 AM  Frazier Butt., PT  [facility] [address] [place], [place], [address] Phone: [phone]   Fax:  [phone]  Name: Tara Potts MRN: [id number] Date of Birth: 03-Dec-1933

## 2019-07-07 ENCOUNTER — Other Ambulatory Visit: Payer: Self-pay

## 2019-07-07 ENCOUNTER — Ambulatory Visit: Payer: Medicare Other | Admitting: Physical Therapy

## 2019-07-07 ENCOUNTER — Encounter: Payer: Self-pay | Admitting: Physical Therapy

## 2019-07-07 DIAGNOSIS — M6281 Muscle weakness (generalized): Secondary | ICD-10-CM

## 2019-07-07 DIAGNOSIS — R2681 Unsteadiness on feet: Secondary | ICD-10-CM

## 2019-07-07 DIAGNOSIS — R2689 Other abnormalities of gait and mobility: Secondary | ICD-10-CM

## 2019-07-07 NOTE — Patient Instructions (Signed)
Fall prevention information provided

## 2019-07-07 NOTE — Therapy (Signed)
[facility] [address] [place], [place], [address] Phone: [phone]   Fax:  [phone]  Physical Therapy Treatment  Patient Details  Name: Tara Potts MRN: [id number] Date of Birth: 18-Sep-1933 Referring Provider (PT): Butler Denmark, NP   Encounter Date: 07/07/2019  PT End of Session - 07/07/19 2009    Visit Number  7    Number of Visits  17    Date for PT Re-Evaluation  09/15/19    Authorization Type  Medicare/BCBS    PT Start Time  1024    PT Stop Time  1102    PT Time Calculation (min)  38 min    Equipment Utilized During Treatment  --   use of R heel wedge   Activity Tolerance  Patient tolerated treatment well;No increased pain   Pt fatigued by end of session   Behavior During Therapy  [facility] for tasks assessed/performed       Past Medical History:  Diagnosis Date  . Anemia   . Arthritis   . Asthma    as a child  . CAD (coronary artery disease)   . Carotid disease, bilateral (HCC)    mild  . Fibroid   . Hx of CABG 2007   [facility]  . Hyperlipemia   . Hypertension   . Menopausal symptoms   . Peripheral neuropathy   . Seizure-like activity [facility])     Past Surgical History:  Procedure Laterality Date  . CARDIAC CATHETERIZATION  01/09/2008   diffusely diseased graft to the PDA & diffuse PDA disease  . CAROTID DOPPLER  08/27/2011   mod. right & nild left ICA stenosis  . CATARACT SURGERY    . CORONARY ARTERY BYPASS GRAFT    . FOOT SURGERY    . JOINT REPLACEMENT     Right hip Dr. Alvan Dame 01-28-18   . MASS REMOVED FROM CERVIX  12/1997   Benign endocervical inclusion cysts  . NM MYOVIEW LTD  01/12/2011   no ischemia  . TOTAL HIP ARTHROPLASTY Right 01/28/2018   Procedure: RIGHT TOTAL HIP ARTHROPLASTY ANTERIOR APPROACH;  Surgeon: Paralee Cancel, MD;  Location: [facility];  Service: Orthopedics;  Laterality: Right;  70 mins  . TRIPLE HEART BYPASS  05/2006   [facility]  . US ECHOCARDIOGRAPHY   10/30/2007   mild MA,MR,TR,AOV mildly sclerotic,density oted in the prox asc aorta    There were no vitals filed for this visit.  Subjective Assessment - 07/07/19 1025    Subjective  No changes, nothing new.  I got mixed up on my appointment time, so I got here for a 9:45 appt time.    Pertinent History  PMH chronic LBP radiating into R hip and R leg; R THR 01/2018, anemia, arthritis, CABG, HTN,peripheral neuropathy, multilevel cervical degenerative disk disease, central canal stenosis, L DDD    Patient Stated Goals  Pt's goals for therapy are to stop hurting in back and walk better.    Currently in Pain?  No/denies                       [facility] Adult PT Treatment/Exercise - 07/07/19 1032      Transfers   Transfers  Sit to Stand;Stand to Sit    Sit to Stand  6: Modified independent (Device/Increase time);Without upper extremity assist;From chair/3-in-1;From bed    Five time sit to stand comments   16.44 sec no UE support    Stand to Sit  6: Modified independent (Device/Increase time);Without upper extremity assist;To chair/3-in-1;To bed    Number of Reps  2 sets;Other reps (comment)   5 reps, from mat, from chair     Standardized Balance Assessment   Standardized Balance Assessment  Timed Up and Go Test      Timed Up and Go Test   TUG  Normal TUG    Normal TUG (seconds)  18.09      Self-Care   Self-Care  Other Self-Care Comments    Other Self-Care Comments   Discussed fall prevention in home environment.  Discussed progress towards STGs and plans towards LTGs.          Balance Exercises - 07/07/19 1036      Balance Exercises: Standing   Tandem Stance  Eyes open;Intermittent upper extremity support;3 reps;10 secs   Additional rep, 30 seconds, each position   SLS  Eyes open;Solid surface;Upper extremity support 2;3 reps;10 secs    Stepping Strategy  Anterior;Posterior;Lateral;UE support;10 reps    Sidestepping  3 reps   Along counter, cues for foot clearance    Heel Raises  10 reps    Toe Raise  10 reps        PT Education - 07/07/19 1057    Education Details  Fall prevention education provided    Person(s) Educated  Patient    Methods  Explanation;Handout    Comprehension  Verbalized understanding       PT Short Term Goals - 07/07/19 2010      PT SHORT TERM GOAL #1   Title  The patient will be indep with HEP for strengthening, balance, and gait.  TARGET 4 weeks, 07/09/2019    Time  4    Period  Weeks    Status  New    Target Date  07/09/19      PT SHORT TERM GOAL #2   Title  Pt will improve TUG score to less than or equal to 25 seconds for decreased fall risk.    Time  4    Period  Weeks    Status  Achieved    Target Date  07/09/19      PT SHORT TERM GOAL #3   Title  Pt will improve 5x sit<>stand to less than or equal to 20 seconds for improved functional lower extremity strength.    Time  4    Period  Weeks    Status  Achieved    Target Date  07/09/19      PT SHORT TERM GOAL #4   Title  Pt will verbalize understanding of fall prevention in home environment.    Time  4    Period  Weeks    Status  Achieved    Target Date  07/09/19        PT Long Term Goals - 06/17/19 2107      PT LONG TERM GOAL #1   Title  The patient will be indep with progression of HEP.  TARGET 8 weeks, 08/07/2019    Time  8    Period  Weeks    Status  New    Target Date  08/07/19      PT LONG TERM GOAL #2   Title  Pt will improve TUG score to less than or equal to 20 seconds for decreased fall risk.    Time  8    Period  Weeks    Status  New    Target Date  08/07/19  PT LONG TERM GOAL #3   Title  Pt will improve 5x sit<>stand to less than or equal to 17 seconds for improved functional strength.    Time  8    Period  Weeks    Status  New    Target Date  08/07/19      PT LONG TERM GOAL #4   Title  Pt will improve gait velocity 1.8 ft/sec for improved gait efficiency and decreased fall risk.    Time  8    Period  Weeks     Status  New    Target Date  08/07/19      PT LONG TERM GOAL #5   Title  Pt will verbalize understanding of posture/postiioning with ADLs to decrease back pain with ADLs and functional activities.    Time  8    Period  Weeks    Status  New    Target Date  08/07/19            Plan - 07/07/19 2010    Clinical Impression Statement  Began assessing STGs this visit, with pt meeting STG 2, 3, 4.  Pt has demonstrated improvement in TUG and 5x sit<>stand scores.  Worked on standing balance exercises today and will likely add to HEP next visit.  Pt will continue to benefit from skilled PT to address improved balance, strength and gait for improved mobility and decreased fall risk.    Personal Factors and Comorbidities  Comorbidity 3+    Comorbidities  PMH chronic LBP radiating into R hip and R leg; R THR 01/2018, anemia, arthritis, CABG, HTN,peripheral neuropathy, multilevel cervical degenerative disk disease, central canal stenosis, L DDD    Examination-Activity Limitations  Locomotion Level;Transfers    Examination-Participation Restrictions  Community Activity    Stability/Clinical Decision Making  Evolving/Moderate complexity    Rehab Potential  Good    PT Frequency  2x / week    PT Duration  8 weeks   including eval week   PT Treatment/Interventions  ADLs/Self Care Home Management;DME Instruction;Gait training;Functional mobility training;Therapeutic activities;Therapeutic exercise;Balance training;Neuromuscular re-education;Patient/family education;Manual techniques;Electrical Stimulation;Moist Heat    PT Next Visit Plan  Check HEP; add appts (2x/wk for 3 more weeks); work on standing balance exercises to add to HEP    PT Home Exercise Plan  Access Code: [email or web address]    Consulted and Agree with Plan of Care  Patient       Patient will benefit from skilled therapeutic intervention in order to improve the following deficits and impairments:  Abnormal gait, Difficulty walking, Decreased  balance, Decreased mobility, Decreased strength, Pain  Visit Diagnosis: Unsteadiness on feet  Muscle weakness (generalized)  Other abnormalities of gait and mobility     Problem List Patient Active Problem List   Diagnosis Date Noted  . Multiple myeloma ([place]) 05/26/2018  . Goals of care, counseling/discussion 05/26/2018  . Obese 01/29/2018  . S/P right THA, AA 01/28/2018  . S/P hip replacement 01/28/2018  . Carotid artery disease ([place]) 04/12/2017  . Passed out 10/05/2015  . Lumbosacral radiculopathy 09/10/2014  . Cervical stenosis of spinal canal 09/10/2014  . Abnormality of gait 07/27/2014  . Paresthesia 07/27/2014  . Bilateral leg cramps 02/21/2014  . Coronary artery disease 01/13/2013  . Status post coronary artery bypass grafting: 2007 01/13/2013  . Peripheral vascular disease:  Moderate right and mild left ICA stenosis. 01/13/2013  . Essential hypertension 01/13/2013  . Elevated cholesterol   . Menopause 02/12/2011  . Leiomyoma of body of  uterus 02/12/2011  . Post-menopausal atrophic vaginitis 02/12/2011  . Urinary frequency 02/12/2011  . UNSPECIFIED PERIPHERAL VASCULAR DISEASE 07/11/2010    Frazier Butt. 07/07/2019, 8:18 PM  Frazier Butt., PT   [employer] [address] [place], [place], [address] Phone: [phone]   Fax:  [phone]  Name: Tara Potts MRN: [id number] Date of Birth: 1933-08-01

## 2019-07-09 ENCOUNTER — Ambulatory Visit: Payer: Medicare Other | Admitting: Physical Therapy

## 2019-07-09 ENCOUNTER — Encounter: Payer: Self-pay | Admitting: Physical Therapy

## 2019-07-09 ENCOUNTER — Other Ambulatory Visit: Payer: Self-pay

## 2019-07-09 DIAGNOSIS — R2689 Other abnormalities of gait and mobility: Secondary | ICD-10-CM

## 2019-07-09 DIAGNOSIS — M6281 Muscle weakness (generalized): Secondary | ICD-10-CM

## 2019-07-09 DIAGNOSIS — R2681 Unsteadiness on feet: Secondary | ICD-10-CM

## 2019-07-09 NOTE — Patient Instructions (Addendum)
Access Code: [email or web address]  URL: [email or web address]  Date: 07/09/2019  Prepared by: Mady Haagensen   Exercises Seated Hamstring Stretch - 3 reps - 1 sets - 30 hold - 1x daily - 5x weekly Hooklying Single Leg Bent Knee Fallouts with Resistance - 10 reps - 2-3 sets - 1x daily - 5x weekly Single Knee to Chest Stretch - 3 reps - 1 sets - 30 hold - 1x daily - 5x weekly Supine Lower Trunk Rotation - 3 reps - 1 sets - 30 hold - 1x daily - 5x weekly Sit to Stand with Armchair - 10 reps - 1 sets - 1x daily - 5x weekly  Added 07/09/2019: Side Stepping with Counter Support - 3-5 reps - 1x daily - 5x weekly Alternating Step Taps with Counter Support - 10 reps - 1-2 sets - 1x daily - 5x weekly Alternating Step Forward with Support - 10 reps - 1-2 sets - 1x daily - 5x weekly Alternating Step Backward with Support - 10 reps - 1-2 sets - 1x daily - 5x weekly Step Sideways - 10 reps - 1-2 sets - 1x daily - 5x weekly

## 2019-07-09 NOTE — Therapy (Signed)
[facility] [address] [place], [place], [address] Phone: [phone]   Fax:  [phone]  Physical Therapy Treatment  Patient Details  Name: DYLANIE QUESENBERRY MRN: [id number] Date of Birth: 1934/06/09 Referring Provider (PT): Butler Denmark, NP   Encounter Date: 07/09/2019  PT End of Session - 07/09/19 1221    Visit Number  8    Number of Visits  17    Date for PT Re-Evaluation  09/15/19    Authorization Type  Medicare/BCBS    PT Start Time  1035    PT Stop Time  1130    PT Time Calculation (min)  55 min    Equipment Utilized During Treatment  --   use of R heel wedge   Activity Tolerance  Patient tolerated treatment well;No increased pain   Pt fatigued by end of session   Behavior During Therapy  [facility] for tasks assessed/performed       Past Medical History:  Diagnosis Date  . Anemia   . Arthritis   . Asthma    as a child  . CAD (coronary artery disease)   . Carotid disease, bilateral (HCC)    mild  . Fibroid   . Hx of CABG 2007   [facility]  . Hyperlipemia   . Hypertension   . Menopausal symptoms   . Peripheral neuropathy   . Seizure-like activity [facility])     Past Surgical History:  Procedure Laterality Date  . CARDIAC CATHETERIZATION  01/09/2008   diffusely diseased graft to the PDA & diffuse PDA disease  . CAROTID DOPPLER  08/27/2011   mod. right & nild left ICA stenosis  . CATARACT SURGERY    . CORONARY ARTERY BYPASS GRAFT    . FOOT SURGERY    . JOINT REPLACEMENT     Right hip Dr. Alvan Dame 01-28-18   . MASS REMOVED FROM CERVIX  12/1997   Benign endocervical inclusion cysts  . NM MYOVIEW LTD  01/12/2011   no ischemia  . TOTAL HIP ARTHROPLASTY Right 01/28/2018   Procedure: RIGHT TOTAL HIP ARTHROPLASTY ANTERIOR APPROACH;  Surgeon: Paralee Cancel, MD;  Location: [facility];  Service: Orthopedics;  Laterality: Right;  70 mins  . TRIPLE HEART BYPASS  05/2006   [facility]  . US ECHOCARDIOGRAPHY   10/30/2007   mild MA,MR,TR,AOV mildly sclerotic,density oted in the prox asc aorta    There were no vitals filed for this visit.  Subjective Assessment - 07/09/19 0937    Subjective  No changes, other than just being very tired later yesterday.    Pertinent History  PMH chronic LBP radiating into R hip and R leg; R THR 01/2018, anemia, arthritis, CABG, HTN,peripheral neuropathy, multilevel cervical degenerative disk disease, central canal stenosis, L DDD    Patient Stated Goals  Pt's goals for therapy are to stop hurting in back and walk better.    Currently in Pain?  No/denies                       [facility] Adult PT Treatment/Exercise - 07/09/19 0001      Self-Care   Self-Care  Other Self-Care Comments    Other Self-Care Comments   Pt c/o pain along L lateral lower leg, along distal aspect of anterior tibialis.  Discussed ways to stretch this muscle and use massage or ice massage along anterior tibialis to calm this down.  Measured bilateral calves, no swelling or warmth or redness noted RLE  versus left.  Pt verbalizes understanding of how to address this (likely) muscular type pain.      Knee/Hip Exercises: Aerobic   Other Aerobic  Scifit 4 extremities level 3.0>2 for 8 minutes with goal >/=65 rpm for strengthing and activity tolerance.        Reviewed Exercises as part of HEP, with pt return demo understanding Seated Hamstring Stretch - 3 reps - 1 sets - 30 hold - 1x daily - 5x weekly Hooklying Single Leg Bent Knee Fallouts with Resistance - 10 reps - 2-3 sets - 1x daily - 5x weekly (educated in increaseing to 2-3 sets of 10, tightening red band at home) Single Knee to Chest Stretch - 3 reps - 1 sets - 30 hold - 1x daily - 5x weekly Supine Lower Trunk Rotation - 3 reps - 1 sets - 30 hold - 1x daily - 5x weekly Sit to Stand with Armchair - 10 reps - 1 sets - 1x daily - 5x weekly      Neuro Re-education   Balance Exercises - 07/09/19 1003      Balance Exercises:  Standing   Stepping Strategy  Anterior;Posterior;Lateral;UE support;10 reps    Sidestepping  3 reps   Along counter, cues for foot clearance   Heel Raises  10 reps    Toe Raise  10 reps    Other Standing Exercises  Alternating step taps to 4" step 10 reps each side, UE support at counter        PT Education - 07/09/19 1220    Education Details  Updates to HEP-see instructions; use of massage, stretch, ice massage to R lower leg    Person(s) Educated  Patient    Methods  Explanation;Demonstration;Handout    Comprehension  Verbalized understanding;Returned demonstration       PT Short Term Goals - 07/09/19 1221      PT SHORT TERM GOAL #1   Title  The patient will be indep with HEP for strengthening, balance, and gait.  TARGET 4 weeks, 07/09/2019    Time  4    Period  Weeks    Status  Achieved    Target Date  07/09/19      PT SHORT TERM GOAL #2   Title  Pt will improve TUG score to less than or equal to 25 seconds for decreased fall risk.    Time  4    Period  Weeks    Status  Achieved    Target Date  07/09/19      PT SHORT TERM GOAL #3   Title  Pt will improve 5x sit<>stand to less than or equal to 20 seconds for improved functional lower extremity strength.    Time  4    Period  Weeks    Status  Achieved    Target Date  07/09/19      PT SHORT TERM GOAL #4   Title  Pt will verbalize understanding of fall prevention in home environment.    Time  4    Period  Weeks    Status  Achieved    Target Date  07/09/19        PT Long Term Goals - 06/17/19 2107      PT LONG TERM GOAL #1   Title  The patient will be indep with progression of HEP.  TARGET 8 weeks, 08/07/2019    Time  8    Period  Weeks    Status  New  Target Date  08/07/19      PT LONG TERM GOAL #2   Title  Pt will improve TUG score to less than or equal to 20 seconds for decreased fall risk.    Time  8    Period  Weeks    Status  New    Target Date  08/07/19      PT LONG TERM GOAL #3   Title  Pt  will improve 5x sit<>stand to less than or equal to 17 seconds for improved functional strength.    Time  8    Period  Weeks    Status  New    Target Date  08/07/19      PT LONG TERM GOAL #4   Title  Pt will improve gait velocity 1.8 ft/sec for improved gait efficiency and decreased fall risk.    Time  8    Period  Weeks    Status  New    Target Date  08/07/19      PT LONG TERM GOAL #5   Title  Pt will verbalize understanding of posture/postiioning with ADLs to decrease back pain with ADLs and functional activities.    Time  8    Period  Weeks    Status  New    Target Date  08/07/19            Plan - 07/09/19 1222    Clinical Impression Statement  Reviewed full HEP, with pt return demo understanding.  Pt has met STG 1 for independence with HEP.  Discussed progression of clamshell exercise to 2-3 sets of 10 reps.  Added standing balance exercises to HEP.  Pt does c/o pain in lateral R lower leg, which likely is muscular in nature to increased stregnthening, increased focus on dorsiflexion with exercises and gait.  Pt will continue to benefit from skilled PT to address strength, balance, and gait towards LTGs.    Personal Factors and Comorbidities  Comorbidity 3+    Comorbidities  PMH chronic LBP radiating into R hip and R leg; R THR 01/2018, anemia, arthritis, CABG, HTN,peripheral neuropathy, multilevel cervical degenerative disk disease, central canal stenosis, L DDD    Examination-Activity Limitations  Locomotion Level;Transfers    Examination-Participation Restrictions  Community Activity    Stability/Clinical Decision Making  Evolving/Moderate complexity    Rehab Potential  Good    PT Frequency  2x / week    PT Duration  8 weeks   including eval week   PT Treatment/Interventions  ADLs/Self Care Home Management;DME Instruction;Gait training;Functional mobility training;Therapeutic activities;Therapeutic exercise;Balance training;Neuromuscular re-education;Patient/family  education;Manual techniques;Electrical Stimulation;Moist Heat    PT Next Visit Plan  Review additions to HEP working on balance; further balance and strengthening, gait training working towards LTGs; ask how R lower leg pain is    PT Home Exercise Plan  Access Code: [email or web address]    Consulted and Agree with Plan of Care  Patient       Patient will benefit from skilled therapeutic intervention in order to improve the following deficits and impairments:  Abnormal gait, Difficulty walking, Decreased balance, Decreased mobility, Decreased strength, Pain  Visit Diagnosis: Muscle weakness (generalized)  Unsteadiness on feet  Other abnormalities of gait and mobility     Problem List Patient Active Problem List   Diagnosis Date Noted  . Multiple myeloma ([place]) 05/26/2018  . Goals of care, counseling/discussion 05/26/2018  . Obese 01/29/2018  . S/P right THA, AA 01/28/2018  . S/P hip replacement 01/28/2018  .  Carotid artery disease ([place]) 04/12/2017  . Passed out 10/05/2015  . Lumbosacral radiculopathy 09/10/2014  . Cervical stenosis of spinal canal 09/10/2014  . Abnormality of gait 07/27/2014  . Paresthesia 07/27/2014  . Bilateral leg cramps 02/21/2014  . Coronary artery disease 01/13/2013  . Status post coronary artery bypass grafting: 2007 01/13/2013  . Peripheral vascular disease:  Moderate right and mild left ICA stenosis. 01/13/2013  . Essential hypertension 01/13/2013  . Elevated cholesterol   . Menopause 02/12/2011  . Leiomyoma of body of uterus 02/12/2011  . Post-menopausal atrophic vaginitis 02/12/2011  . Urinary frequency 02/12/2011  . UNSPECIFIED PERIPHERAL VASCULAR DISEASE 07/11/2010    Frazier Butt. 07/09/2019, 12:25 PM  Frazier Butt., PT   [facility] [address] [place], [place], [address] Phone: [phone]   Fax:  [phone]  Name: TIMBERLYN PICKFORD MRN: [id number] Date of Birth: [birth year]

## 2019-07-14 ENCOUNTER — Other Ambulatory Visit: Payer: Self-pay

## 2019-07-14 ENCOUNTER — Ambulatory Visit: Payer: Medicare Other | Admitting: Physical Therapy

## 2019-07-14 ENCOUNTER — Encounter: Payer: Self-pay | Admitting: Physical Therapy

## 2019-07-14 DIAGNOSIS — R2689 Other abnormalities of gait and mobility: Secondary | ICD-10-CM | POA: Diagnosis not present

## 2019-07-14 DIAGNOSIS — R2681 Unsteadiness on feet: Secondary | ICD-10-CM

## 2019-07-14 NOTE — Therapy (Signed)
[facility] [address] [place], [place], [address] Phone: [phone]   Fax:  [phone]  Physical Therapy Treatment  Patient Details  Name: IVYANA LOCEY MRN: [id number] Date of Birth: 04-Apr-1934 Referring Provider (PT): Butler Denmark, NP   Encounter Date: 07/14/2019  PT End of Session - 07/14/19 1130    Visit Number  9    Number of Visits  17    Date for PT Re-Evaluation  09/15/19    Authorization Type  Medicare/BCBS    PT Start Time  0853    PT Stop Time  0931    PT Time Calculation (min)  38 min    Equipment Utilized During Treatment  --   use of R heel wedge   Activity Tolerance  Patient tolerated treatment well;No increased pain   Pt fatigued by end of session   Behavior During Therapy  [facility] for tasks assessed/performed       Past Medical History:  Diagnosis Date  . Anemia   . Arthritis   . Asthma    as a child  . CAD (coronary artery disease)   . Carotid disease, bilateral (HCC)    mild  . Fibroid   . H[facility] of CABG 2007   [facility]  . Hyperlipemia   . Hypertension   . Menopausal symptoms   . Peripheral neuropathy   . Seizure-like activity [facility])     Past Surgical History:  Procedure Laterality Date  . CARDIAC CATHETERIZATION  01/09/2008   diffusely diseased graft to the PDA & diffuse PDA disease  . CAROTID DOPPLER  08/27/2011   mod. right & nild left ICA stenosis  . CATARACT SURGERY    . CORONARY ARTERY BYPASS GRAFT    . FOOT SURGERY    . JOINT REPLACEMENT     Right hip Dr. Alvan Dame 01-28-18   . MASS REMOVED FROM CERVIX  12/1997   Benign endocervical inclusion cysts  . NM MYOVIEW LTD  01/12/2011   no ischemia  . TOTAL HIP ARTHROPLASTY Right 01/28/2018   Procedure: RIGHT TOTAL HIP ARTHROPLASTY ANTERIOR APPROACH;  Surgeon: Paralee Cancel, MD;  Location: [facility];  Service: Orthopedics;  Laterality: Right;  70 mins  . TRIPLE HEART BYPASS  05/2006   [facility]  . US ECHOCARDIOGRAPHY   10/30/2007   mild MA,MR,TR,AOV mildly sclerotic,density oted in the prox asc aorta    There were no vitals filed for this visit.  Subjective Assessment - 07/14/19 0854    Subjective  R lower leg still a little sore.    Pertinent History  PMH chronic LBP radiating into R hip and R leg; R THR 01/2018, anemia, arthritis, CABG, HTN,peripheral neuropathy, multilevel cervical degenerative disk disease, central canal stenosis, L DDD    Patient Stated Goals  Pt's goals for therapy are to stop hurting in back and walk better.    Currently in Pain?  Yes    Pain Score  2     Pain Location  Leg    Pain Orientation  Right    Pain Descriptors / Indicators  Aching    Pain Type  Acute pain    Pain Onset  In the past 7 days    Pain Frequency  Intermittent    Aggravating Factors   unsure    Pain Relieving Factors  unsure                       [facility] Adult PT Treatment/Exercise -  07/14/19 0859      Ambulation/Gait   Ambulation/Gait  Yes    Ambulation/Gait Assistance  5: Supervision;6: Modified independent (Device/Increase time)    Ambulation/Gait Assistance Details  Cues to widen BOS with gait    Ambulation Distance (Feet)  230 Feet   40 ft,220 ft with environmental scanning   Assistive device  Straight cane    Gait Pattern  Step-through pattern;Trendelenburg;Narrow base of support;Poor foot clearance - right    Ambulation Surface  Level;Indoor    Gait Comments  Environmental scanning tasks looking for objects, with head motions for environmental scanning     Additional 80 ft with cane at end of session; modified independent.  Cues for widened BOS     Balance Exercises - 07/14/19 0902      Balance Exercises: Standing   Standing Eyes Opened  Wide (BOA);Head turns;Solid surface;5 reps   Head nods, 2 sets   Standing Eyes Closed  Wide (BOA);Solid surface;10 secs;2 reps;Narrow base of support (BOS)    Gait with Head Turns  Forward;Upper extremity support;3 reps   At counter    Partial Tandem Stance  Eyes open;3 reps;Upper extremity support 2   Head turns, head nods   Retro Gait  Upper extremity support;4 reps   Forward/back walking along counter-cues for foot placement   Marching  Solid surface;Upper extremity assist 1;10 reps   Cues to keep feet shoulder-width apart   Other Standing Exercises  Lateral weigthshifting x 10 reps no UE support      Reviewed HEP from last visit, with pt return demo understanding  Side Stepping with Counter Support - 3-5 reps - 1x daily - 5x weekly Alternating Step Taps with Counter Support - 10 reps - 1-2 sets - 1x daily - 5x weekly Alternating Step Forward with Support - 10 reps - 1-2 sets - 1x daily - 5x weekly (cues provided to widen BOS to return to midline) Alternating Step Backward with Support - 10 reps - 1-2 sets - 1x daily - 5x weekly Step Sideways - 10 reps - 1-2 sets - 1x daily - 5x weekly      PT Short Term Goals - 07/09/19 1221      PT SHORT TERM GOAL #1   Title  The patient will be indep with HEP for strengthening, balance, and gait.  TARGET 4 weeks, 07/09/2019    Time  4    Period  Weeks    Status  Achieved    Target Date  07/09/19      PT SHORT TERM GOAL #2   Title  Pt will improve TUG score to less than or equal to 25 seconds for decreased fall risk.    Time  4    Period  Weeks    Status  Achieved    Target Date  07/09/19      PT SHORT TERM GOAL #3   Title  Pt will improve 5x sit<>stand to less than or equal to 20 seconds for improved functional lower extremity strength.    Time  4    Period  Weeks    Status  Achieved    Target Date  07/09/19      PT SHORT TERM GOAL #4   Title  Pt will verbalize understanding of fall prevention in home environment.    Time  4    Period  Weeks    Status  Achieved    Target Date  07/09/19        PT  Long Term Goals - 06/17/19 2107      PT LONG TERM GOAL #1   Title  The patient will be indep with progression of HEP.  TARGET 8 weeks, 08/07/2019    Time  8     Period  Weeks    Status  New    Target Date  08/07/19      PT LONG TERM GOAL #2   Title  Pt will improve TUG score to less than or equal to 20 seconds for decreased fall risk.    Time  8    Period  Weeks    Status  New    Target Date  08/07/19      PT LONG TERM GOAL #3   Title  Pt will improve 5x sit<>stand to less than or equal to 17 seconds for improved functional strength.    Time  8    Period  Weeks    Status  New    Target Date  08/07/19      PT LONG TERM GOAL #4   Title  Pt will improve gait velocity 1.8 ft/sec for improved gait efficiency and decreased fall risk.    Time  8    Period  Weeks    Status  New    Target Date  08/07/19      PT LONG TERM GOAL #5   Title  Pt will verbalize understanding of posture/postiioning with ADLs to decrease back pain with ADLs and functional activities.    Time  8    Period  Weeks    Status  New    Target Date  08/07/19            Plan - 07/14/19 1130    Clinical Impression Statement  Reviewed additions to balance HEP given last visit, with pt return demo understanding.  Worked additional balance activities with head motions in stationary positions, progressing to short distance,then longer distance gait with head turns.  With gait with environmental scanning, pt noted to have slowed gait overall, no LOB.  Pt will continue to benefit from skilled PT to address strength, balance, and gait towards LTGs.    Personal Factors and Comorbidities  Comorbidity 3+    Comorbidities  PMH chronic LBP radiating into R hip and R leg; R THR 01/2018, anemia, arthritis, CABG, HTN,peripheral neuropathy, multilevel cervical degenerative disk disease, central canal stenosis, L DDD    Examination-Activity Limitations  Locomotion Level;Transfers    Examination-Participation Restrictions  Community Activity    Stability/Clinical Decision Making  Evolving/Moderate complexity    Rehab Potential  Good    PT Frequency  2x / week    PT Duration  8 weeks    including eval week   PT Treatment/Interventions  ADLs/Self Care Home Management;DME Instruction;Gait training;Functional mobility training;Therapeutic activities;Therapeutic exercise;Balance training;Neuromuscular re-education;Patient/family education;Manual techniques;Electrical Stimulation;Moist Heat    PT Next Visit Plan  Next visit is 10th visit-will need PROGRESS NOTE.  Continue to work on balance, strength, gait training-try step ups, step downs (at lower step)    PT Home Exercise Plan  Access Code: [email or web address]    Consulted and Agree with Plan of Care  Patient       Patient will benefit from skilled therapeutic intervention in order to improve the following deficits and impairments:  Abnormal gait, Difficulty walking, Decreased balance, Decreased mobility, Decreased strength, Pain  Visit Diagnosis: Unsteadiness on feet  Other abnormalities of gait and mobility     Problem List Patient Active Problem  List   Diagnosis Date Noted  . Multiple myeloma ([place]) 05/26/2018  . Goals of care, counseling/discussion 05/26/2018  . Obese 01/29/2018  . S/P right THA, AA 01/28/2018  . S/P hip replacement 01/28/2018  . Carotid artery disease ([place]) 04/12/2017  . Passed out 10/05/2015  . Lumbosacral radiculopathy 09/10/2014  . Cervical stenosis of spinal canal 09/10/2014  . Abnormality of gait 07/27/2014  . Paresthesia 07/27/2014  . Bilateral leg cramps 02/21/2014  . Coronary artery disease 01/13/2013  . Status post coronary artery bypass grafting: 2007 01/13/2013  . Peripheral vascular disease:  Moderate right and mild left ICA stenosis. 01/13/2013  . Essential hypertension 01/13/2013  . Elevated cholesterol   . Menopause 02/12/2011  . Leiomyoma of body of uterus 02/12/2011  . Post-menopausal atrophic vaginitis 02/12/2011  . Urinary frequency 02/12/2011  . UNSPECIFIED PERIPHERAL VASCULAR DISEASE 07/11/2010    Frazier Butt. 07/14/2019, 11:34 AM  Frazier Butt., PT   [facility] [address] [place], [place], [address] Phone: [phone]   Fax:  [phone]  Name: CHARLIEE KRENZ MRN: [id number] Date of Birth: 1934-03-13

## 2019-07-16 ENCOUNTER — Other Ambulatory Visit: Payer: Self-pay

## 2019-07-16 ENCOUNTER — Encounter: Payer: Self-pay | Admitting: Physical Therapy

## 2019-07-16 ENCOUNTER — Ambulatory Visit: Payer: Medicare Other | Admitting: Physical Therapy

## 2019-07-16 DIAGNOSIS — R2689 Other abnormalities of gait and mobility: Secondary | ICD-10-CM | POA: Diagnosis not present

## 2019-07-16 DIAGNOSIS — R2681 Unsteadiness on feet: Secondary | ICD-10-CM

## 2019-07-16 DIAGNOSIS — M6281 Muscle weakness (generalized): Secondary | ICD-10-CM

## 2019-07-16 NOTE — Therapy (Signed)
[facility] [address] [place], [place], [address] Phone: [phone]   Fax:  [phone]  Physical Therapy Treatment/PROGRESS NOTE  Patient Details  Name: Tara Potts MRN: [id number] Date of Birth: 1934-06-29 Referring Provider (PT): Butler Denmark, NP   Encounter Date: 07/16/2019  PT End of Session - 07/16/19 1323    Visit Number  10    Number of Visits  17    Date for PT Re-Evaluation  09/15/19    Authorization Type  Medicare/BCBS    PT Start Time  1236    PT Stop Time  1318    PT Time Calculation (min)  42 min    Equipment Utilized During Treatment  --   use of R heel wedge   Activity Tolerance  Patient tolerated treatment well;No increased pain   Pt fatigued by end of session   Behavior During Therapy  [facility] for tasks assessed/performed       Past Medical History:  Diagnosis Date  . Anemia   . Arthritis   . Asthma    as a child  . CAD (coronary artery disease)   . Carotid disease, bilateral (HCC)    mild  . Fibroid   . Hx of CABG 2007   [facility]  . Hyperlipemia   . Hypertension   . Menopausal symptoms   . Peripheral neuropathy   . Seizure-like activity [facility])     Past Surgical History:  Procedure Laterality Date  . CARDIAC CATHETERIZATION  01/09/2008   diffusely diseased graft to the PDA & diffuse PDA disease  . CAROTID DOPPLER  08/27/2011   mod. right & nild left ICA stenosis  . CATARACT SURGERY    . CORONARY ARTERY BYPASS GRAFT    . FOOT SURGERY    . JOINT REPLACEMENT     Right hip Dr. Alvan Dame 01-28-18   . MASS REMOVED FROM CERVIX  12/1997   Benign endocervical inclusion cysts  . NM MYOVIEW LTD  01/12/2011   no ischemia  . TOTAL HIP ARTHROPLASTY Right 01/28/2018   Procedure: RIGHT TOTAL HIP ARTHROPLASTY ANTERIOR APPROACH;  Surgeon: Paralee Cancel, MD;  Location: [facility];  Service: Orthopedics;  Laterality: Right;  70 mins  . TRIPLE HEART BYPASS  05/2006   [facility]  . US  ECHOCARDIOGRAPHY  10/30/2007   mild MA,MR,TR,AOV mildly sclerotic,density oted in the prox asc aorta    There were no vitals filed for this visit.  Subjective Assessment - 07/16/19 1239    Subjective  R leg not as sore as it was.  No changes; just a lot of traffic out there today.  Feel like I'm walking better; not hurting as much in my back.  Want to make sure to keep wroking on walking and balance    Pertinent History  PMH chronic LBP radiating into R hip and R leg; R THR 01/2018, anemia, arthritis, CABG, HTN,peripheral neuropathy, multilevel cervical degenerative disk disease, central canal stenosis, L DDD    Patient Stated Goals  Pt's goals for therapy are to stop hurting in back and walk better.    Currently in Pain?  No/denies    Pain Onset  In the past 7 days                       [facility] Adult PT Treatment/Exercise - 07/16/19 0001      Ambulation/Gait   Ambulation/Gait  Yes    Ambulation/Gait Assistance  6: Modified independent (Device/Increase  time)    Ambulation/Gait Assistance Details  Conversation tasks with gait; 1 near LOB on 3rd lap of gym, when RLE fatigued    Ambulation Distance (Feet)  345 Feet   80 ft x 2   Assistive device  Straight cane    Gait Pattern  Step-through pattern;Trendelenburg;Narrow base of support;Poor foot clearance - right    Ambulation Surface  Level;Indoor    Gait velocity  14.28 sec= 2.29 ft/sec; 16.38 sec (2 ft/sec)    Gait Comments  Discussed footwear, as her current sneakers may be contributing to pt's lower leg fatigue (noted to have increased foot sway with standing hip strenghtening exercises.  Pt reports those shoes are older and may have lost their support.  She has other, newer sneakers at home that she will try.      Knee/Hip Exercises: Standing   Hip Abduction  Stengthening;Right;Left;1 set;Knee straight;5 reps   Red theraband   Hip Extension  Stengthening;Right;Left;1 set;5 reps;Knee straight   Red theraband   Other  Standing Knee Exercises  Resisted sidestepping with red theraband, 2 sets R and L along counter          Balance Exercises - 07/16/19 1249      Balance Exercises: Standing   Step Ups  Forward;Lateral;4 inch;UE support 1   Rest break between   Other Standing Exercises  Alternating step taps x 10 reps to 6" step, then consecutive step taps to 6" step x 10 reps ; triple step taps x 10 reps each leg, UE support           PT Short Term Goals - 07/09/19 1221      PT SHORT TERM GOAL #1   Title  The patient will be indep with HEP for strengthening, balance, and gait.  TARGET 4 weeks, 07/09/2019    Time  4    Period  Weeks    Status  Achieved    Target Date  07/09/19      PT SHORT TERM GOAL #2   Title  Pt will improve TUG score to less than or equal to 25 seconds for decreased fall risk.    Time  4    Period  Weeks    Status  Achieved    Target Date  07/09/19      PT SHORT TERM GOAL #3   Title  Pt will improve 5x sit<>stand to less than or equal to 20 seconds for improved functional lower extremity strength.    Time  4    Period  Weeks    Status  Achieved    Target Date  07/09/19      PT SHORT TERM GOAL #4   Title  Pt will verbalize understanding of fall prevention in home environment.    Time  4    Period  Weeks    Status  Achieved    Target Date  07/09/19        PT Long Term Goals - 07/16/19 1327      PT LONG TERM GOAL #1   Title  The patient will be indep with progression of HEP.  TARGET 8 weeks, 08/07/2019    Time  8    Period  Weeks    Status  On-going      PT LONG TERM GOAL #2   Title  Pt will improve TUG score to less than or equal to 15 seconds for decreased fall risk.    Baseline  18.69 sec 07/07/2019 (initial LTG  met)    Time  8    Period  Weeks    Status  Revised      PT LONG TERM GOAL #3   Title  Pt will improve 5x sit<>stand to less than or equal to 15 seconds for improved functional strength.    Baseline  5x:  16.44 sec 07/07/19 (initial LTG  met)    Time  8    Period  Weeks    Status  Revised      PT LONG TERM GOAL #4   Title  Pt will improve gait velocity 2.5 ft/sec for improved gait efficiency and decreased fall risk.    Baseline  2.29 ft/sec 07/16/2019 (initial LTG met)    Time  8    Period  Weeks    Status  Revised      PT LONG TERM GOAL #5   Title  Pt will verbalize understanding of posture/postiioning with ADLs to decrease back pain with ADLs and functional activities.    Time  8    Period  Weeks    Status  On-going            Plan - 07/16/19 1324    Clinical Impression Statement  10th Visit PROGRESS NOTE, spanning visits 06/17/2019-07/16/2019.  Pt reports her walking is better and she is not having as much back pain (she does not typically report back pain in PT sessions).  Recent objective measures:  TUG 18.69 sec (improved from 29.5 sec), 5x sit<>stand:  16.44 sec (improved from 24.59 sec), gait velocity 2.29 ft/sec today (improved from 1.5 ft/sec).  STGs assessed last week, with pt meeting 4 of 4 STGs.  She has met initial LTG 2, 3, and 4; so those LTGs have been revised.  Pt is making progress with functional measures, but does remain at fall risk per TUG score and fatigues through hips and lower legs by end of session.  She will conitnue to benefit from skilled PT to address strength, balance, gait training for improved overall functional moiblity and decreased fall risk.    Personal Factors and Comorbidities  Comorbidity 3+    Comorbidities  PMH chronic LBP radiating into R hip and R leg; R THR 01/2018, anemia, arthritis, CABG, HTN,peripheral neuropathy, multilevel cervical degenerative disk disease, central canal stenosis, L DDD    Examination-Activity Limitations  Locomotion Level;Transfers    Examination-Participation Restrictions  Community Activity    Stability/Clinical Decision Making  Evolving/Moderate complexity    Rehab Potential  Good    PT Frequency  2x / week    PT Duration  8 weeks   including  eval week   PT Treatment/Interventions  ADLs/Self Care Home Management;DME Instruction;Gait training;Functional mobility training;Therapeutic activities;Therapeutic exercise;Balance training;Neuromuscular re-education;Patient/family education;Manual techniques;Electrical Stimulation;Moist Heat    PT Next Visit Plan  Continue to work on balance, strength, gait training-try step ups, step downs (at lower step), SLS, resisted hip exercises    PT Home Exercise Plan  Access Code: [email or web address]    Consulted and Agree with Plan of Care  Patient       Patient will benefit from skilled therapeutic intervention in order to improve the following deficits and impairments:  Abnormal gait, Difficulty walking, Decreased balance, Decreased mobility, Decreased strength, Pain  Visit Diagnosis: Muscle weakness (generalized)  Unsteadiness on feet  Other abnormalities of gait and mobility     Problem List Patient Active Problem List   Diagnosis Date Noted  . Multiple myeloma ([place]) 05/26/2018  . Goals of care,  counseling/discussion 05/26/2018  . Obese 01/29/2018  . S/P right THA, AA 01/28/2018  . S/P hip replacement 01/28/2018  . Carotid artery disease ([place]) 04/12/2017  . Passed out 10/05/2015  . Lumbosacral radiculopathy 09/10/2014  . Cervical stenosis of spinal canal 09/10/2014  . Abnormality of gait 07/27/2014  . Paresthesia 07/27/2014  . Bilateral leg cramps 02/21/2014  . Coronary artery disease 01/13/2013  . Status post coronary artery bypass grafting: 2007 01/13/2013  . Peripheral vascular disease:  Moderate right and mild left ICA stenosis. 01/13/2013  . Essential hypertension 01/13/2013  . Elevated cholesterol   . Menopause 02/12/2011  . Leiomyoma of body of uterus 02/12/2011  . Post-menopausal atrophic vaginitis 02/12/2011  . Urinary frequency 02/12/2011  . UNSPECIFIED PERIPHERAL VASCULAR DISEASE 07/11/2010    MARRIOTT,AMY W. 07/16/2019, 1:32 PM Frazier Butt., PT  [facility] [address] [place], [place], [address] Phone: [phone]   Fax:  [phone]  Name: Tara Potts MRN: [id number] Date of Birth: [birth year]

## 2019-07-21 ENCOUNTER — Other Ambulatory Visit: Payer: Self-pay

## 2019-07-21 ENCOUNTER — Encounter: Payer: Self-pay | Admitting: Physical Therapy

## 2019-07-21 ENCOUNTER — Ambulatory Visit: Payer: Medicare HMO | Attending: Neurology | Admitting: Physical Therapy

## 2019-07-21 DIAGNOSIS — R2681 Unsteadiness on feet: Secondary | ICD-10-CM

## 2019-07-21 DIAGNOSIS — M6281 Muscle weakness (generalized): Secondary | ICD-10-CM | POA: Diagnosis not present

## 2019-07-21 DIAGNOSIS — M79604 Pain in right leg: Secondary | ICD-10-CM | POA: Insufficient documentation

## 2019-07-21 DIAGNOSIS — R2689 Other abnormalities of gait and mobility: Secondary | ICD-10-CM | POA: Diagnosis not present

## 2019-07-21 NOTE — Therapy (Signed)
[facility] [address] [place], [place], [address] Phone: [phone]   Fax:  [phone]  Physical Therapy Treatment  Patient Details  Name: Tara Potts MRN: [id number] Date of Birth: May 20, 1934 Referring Provider (PT): Butler Denmark, NP   Encounter Date: 07/21/2019  PT End of Session - 07/21/19 0852    Visit Number  11    Number of Visits  17    Date for PT Re-Evaluation  09/15/19    Authorization Type  Medicare/BCBS    PT Start Time  0848    PT Stop Time  0929    PT Time Calculation (min)  41 min    Equipment Utilized During Treatment  Gait belt   use of R heel wedge   Activity Tolerance  Patient tolerated treatment well;No increased pain   Pt fatigued by end of session   Behavior During Therapy  [facility] for tasks assessed/performed       Past Medical History:  Diagnosis Date  . Anemia   . Arthritis   . Asthma    as a child  . CAD (coronary artery disease)   . Carotid disease, bilateral (HCC)    mild  . Fibroid   . Hx of CABG 2007   [facility]  . Hyperlipemia   . Hypertension   . Menopausal symptoms   . Peripheral neuropathy   . Seizure-like activity [facility])     Past Surgical History:  Procedure Laterality Date  . CARDIAC CATHETERIZATION  01/09/2008   diffusely diseased graft to the PDA & diffuse PDA disease  . CAROTID DOPPLER  08/27/2011   mod. right & nild left ICA stenosis  . CATARACT SURGERY    . CORONARY ARTERY BYPASS GRAFT    . FOOT SURGERY    . JOINT REPLACEMENT     Right hip Dr. Alvan Dame 01-28-18   . MASS REMOVED FROM CERVIX  12/1997   Benign endocervical inclusion cysts  . NM MYOVIEW LTD  01/12/2011   no ischemia  . TOTAL HIP ARTHROPLASTY Right 01/28/2018   Procedure: RIGHT TOTAL HIP ARTHROPLASTY ANTERIOR APPROACH;  Surgeon: Paralee Cancel, MD;  Location: [facility];  Service: Orthopedics;  Laterality: Right;  70 mins  . TRIPLE HEART BYPASS  05/2006   [facility]  . US ECHOCARDIOGRAPHY   10/30/2007   mild MA,MR,TR,AOV mildly sclerotic,density oted in the prox asc aorta    There were no vitals filed for this visit.  Subjective Assessment - 07/21/19 0851    Subjective  No new complaints. No falls or pain in right leg. No pain currently. HEP is going well.    Pertinent History  PMH chronic LBP radiating into R hip and R leg; R THR 01/2018, anemia, arthritis, CABG, HTN,peripheral neuropathy, multilevel cervical degenerative disk disease, central canal stenosis, L DDD    Patient Stated Goals  Pt's goals for therapy are to stop hurting in back and walk better.    Currently in Pain?  No/denies    Pain Score  0-No pain              [facility] Adult PT Treatment/Exercise - 07/21/19 0853      Transfers   Transfers  Sit to Stand;Stand to Sit    Sit to Stand  6: Modified independent (Device/Increase time);Without upper extremity assist;From chair/3-in-1;From bed      Ambulation/Gait   Ambulation/Gait  Yes    Ambulation/Gait Assistance  6: Modified independent (Device/Increase time)    Ambulation Distance (Feet)  --  around gym with session   Assistive device  Straight cane    Gait Pattern  Step-through pattern;Trendelenburg;Narrow base of support;Poor foot clearance - right    Ambulation Surface  Level;Indoor      Knee/Hip Exercises: Aerobic   Other Aerobic  Scifit 4 extremities level 3.0 for 6 minutes with goal >/=65 rpm for strengthing and activity tolerance.       Knee/Hip Exercises: Standing   Other Standing Knee Exercises  resisted 4 way hip kicks with red band resistance for 10 reps each way on bil LE's. Cues for posture and ex form, min guard assist for balance with UE support.           Balance Exercises - 07/21/19 0908      Balance Exercises: Standing   Rockerboard  Anterior/posterior;Lateral;Limitations    Rockerboard Limitations  performed both ways on balance board: rocking the board with EO with emphasis on tall posture; then holding board steady for  EC, progressing to head movements with EO. light fingertip support to no support on sturdy surface, min guard to min assist for balance.            PT Short Term Goals - 07/09/19 1221      PT SHORT TERM GOAL #1   Title  The patient will be indep with HEP for strengthening, balance, and gait.  TARGET 4 weeks, 07/09/2019    Time  4    Period  Weeks    Status  Achieved    Target Date  07/09/19      PT SHORT TERM GOAL #2   Title  Pt will improve TUG score to less than or equal to 25 seconds for decreased fall risk.    Time  4    Period  Weeks    Status  Achieved    Target Date  07/09/19      PT SHORT TERM GOAL #3   Title  Pt will improve 5x sit<>stand to less than or equal to 20 seconds for improved functional lower extremity strength.    Time  4    Period  Weeks    Status  Achieved    Target Date  07/09/19      PT SHORT TERM GOAL #4   Title  Pt will verbalize understanding of fall prevention in home environment.    Time  4    Period  Weeks    Status  Achieved    Target Date  07/09/19        PT Long Term Goals - 07/16/19 1327      PT LONG TERM GOAL #1   Title  The patient will be indep with progression of HEP.  TARGET 8 weeks, 08/07/2019    Time  8    Period  Weeks    Status  On-going      PT LONG TERM GOAL #2   Title  Pt will improve TUG score to less than or equal to 15 seconds for decreased fall risk.    Baseline  18.69 sec 07/07/2019 (initial LTG met)    Time  8    Period  Weeks    Status  Revised      PT LONG TERM GOAL #3   Title  Pt will improve 5x sit<>stand to less than or equal to 15 seconds for improved functional strength.    Baseline  5x:  16.44 sec 07/07/19 (initial LTG met)    Time  8    Period  Weeks    Status  Revised      PT LONG TERM GOAL #4   Title  Pt will improve gait velocity 2.5 ft/sec for improved gait efficiency and decreased fall risk.    Baseline  2.29 ft/sec 07/16/2019 (initial LTG met)    Time  8    Period  Weeks    Status   Revised      PT LONG TERM GOAL #5   Title  Pt will verbalize understanding of posture/postiioning with ADLs to decrease back pain with ADLs and functional activities.    Time  8    Period  Weeks    Status  On-going            Plan - 07/21/19 6553    Clinical Impression Statement  Today's skilled session continued to foucs on LE strengthening and balance reactions with rest breaks needed due to fatigue with activity. No increased pain reported. The pt is making steady progress toward goals and should benefit from continued PT to progress toward unmet goals.    Personal Factors and Comorbidities  Comorbidity 3+    Comorbidities  PMH chronic LBP radiating into R hip and R leg; R THR 01/2018, anemia, arthritis, CABG, HTN,peripheral neuropathy, multilevel cervical degenerative disk disease, central canal stenosis, L DDD    Examination-Activity Limitations  Locomotion Level;Transfers    Examination-Participation Restrictions  Community Activity    Stability/Clinical Decision Making  Evolving/Moderate complexity    Rehab Potential  Good    PT Frequency  2x / week    PT Duration  8 weeks   including eval week   PT Treatment/Interventions  ADLs/Self Care Home Management;DME Instruction;Gait training;Functional mobility training;Therapeutic activities;Therapeutic exercise;Balance training;Neuromuscular re-education;Patient/family education;Manual techniques;Electrical Stimulation;Moist Heat    PT Next Visit Plan  Continue to work on balance, strength, gait training-try step ups, step downs (at lower step), SLS, resisted hip exercises    PT Home Exercise Plan  Access Code: [email or web address]    Consulted and Agree with Plan of Care  Patient       Patient will benefit from skilled therapeutic intervention in order to improve the following deficits and impairments:  Abnormal gait, Difficulty walking, Decreased balance, Decreased mobility, Decreased strength, Pain  Visit Diagnosis: Muscle weakness  (generalized)  Unsteadiness on feet  Other abnormalities of gait and mobility     Problem List Patient Active Problem List   Diagnosis Date Noted  . Multiple myeloma ([place]) 05/26/2018  . Goals of care, counseling/discussion 05/26/2018  . Obese 01/29/2018  . S/P right THA, AA 01/28/2018  . S/P hip replacement 01/28/2018  . Carotid artery disease ([place]) 04/12/2017  . Passed out 10/05/2015  . Lumbosacral radiculopathy 09/10/2014  . Cervical stenosis of spinal canal 09/10/2014  . Abnormality of gait 07/27/2014  . Paresthesia 07/27/2014  . Bilateral leg cramps 02/21/2014  . Coronary artery disease 01/13/2013  . Status post coronary artery bypass grafting: 2007 01/13/2013  . Peripheral vascular disease:  Moderate right and mild left ICA stenosis. 01/13/2013  . Essential hypertension 01/13/2013  . Elevated cholesterol   . Menopause 02/12/2011  . Leiomyoma of body of uterus 02/12/2011  . Post-menopausal atrophic vaginitis 02/12/2011  . Urinary frequency 02/12/2011  . UNSPECIFIED PERIPHERAL VASCULAR DISEASE 07/11/2010    Willow Ora, PTA, [facility] [address], [place], [place] [address] [phone] 07/21/19, 1:30 PM   Name: KEMIA WENDEL MRN: [id number] Date of Birth: 14-Jul-1934

## 2019-07-24 ENCOUNTER — Ambulatory Visit: Payer: Medicare HMO | Admitting: Physical Therapy

## 2019-07-30 ENCOUNTER — Encounter: Payer: Self-pay | Admitting: Physical Therapy

## 2019-07-30 ENCOUNTER — Ambulatory Visit: Payer: Medicare HMO | Admitting: Physical Therapy

## 2019-07-30 ENCOUNTER — Other Ambulatory Visit: Payer: Self-pay

## 2019-07-30 DIAGNOSIS — M6281 Muscle weakness (generalized): Secondary | ICD-10-CM | POA: Diagnosis not present

## 2019-07-30 DIAGNOSIS — R2689 Other abnormalities of gait and mobility: Secondary | ICD-10-CM | POA: Diagnosis not present

## 2019-07-30 DIAGNOSIS — M79604 Pain in right leg: Secondary | ICD-10-CM

## 2019-07-30 DIAGNOSIS — R2681 Unsteadiness on feet: Secondary | ICD-10-CM | POA: Diagnosis not present

## 2019-07-31 NOTE — Therapy (Signed)
[facility] [address] [place], [place], [address] Phone: [phone]   Fax:  [phone]  Physical Therapy Treatment  Patient Details  Name: Tara Potts MRN: [id number] Date of Birth: 1934/01/02 Referring Provider (PT): Butler Denmark, NP   Encounter Date: 07/30/2019  PT End of Session - 07/30/19 1454    Visit Number  12    Number of Visits  17    Date for PT Re-Evaluation  09/15/19    Authorization Type  Medicare/BCBS    PT Start Time  1450    PT Stop Time  1530    PT Time Calculation (min)  40 min    Equipment Utilized During Treatment  Gait belt   use of R heel wedge   Activity Tolerance  Patient tolerated treatment well;No increased pain   Pt fatigued by end of session   Behavior During Therapy  [facility] for tasks assessed/performed       Past Medical History:  Diagnosis Date  . Anemia   . Arthritis   . Asthma    as a child  . CAD (coronary artery disease)   . Carotid disease, bilateral (HCC)    mild  . Fibroid   . Hx of CABG 2007   [facility]  . Hyperlipemia   . Hypertension   . Menopausal symptoms   . Peripheral neuropathy   . Seizure-like activity [facility])     Past Surgical History:  Procedure Laterality Date  . CARDIAC CATHETERIZATION  01/09/2008   diffusely diseased graft to the PDA & diffuse PDA disease  . CAROTID DOPPLER  08/27/2011   mod. right & nild left ICA stenosis  . CATARACT SURGERY    . CORONARY ARTERY BYPASS GRAFT    . FOOT SURGERY    . JOINT REPLACEMENT     Right hip Dr. Alvan Dame 01-28-18   . MASS REMOVED FROM CERVIX  12/1997   Benign endocervical inclusion cysts  . NM MYOVIEW LTD  01/12/2011   no ischemia  . TOTAL HIP ARTHROPLASTY Right 01/28/2018   Procedure: RIGHT TOTAL HIP ARTHROPLASTY ANTERIOR APPROACH;  Surgeon: Paralee Cancel, MD;  [facility];  Service: Orthopedics;  Laterality: Right;  70 mins  . TRIPLE HEART BYPASS  05/2006   [facility]  . US  ECHOCARDIOGRAPHY  10/30/2007   mild MA,MR,TR,AOV mildly sclerotic,density oted in the prox asc aorta    There were no vitals filed for this visit.  Subjective Assessment - 07/30/19 1452    Subjective  No new complaints. No falls or pain right now. Does continue to have the occasional back pain. "just been working on getting stronger".    Pertinent History  PMH chronic LBP radiating into R hip and R leg; R THR 01/2018, anemia, arthritis, CABG, HTN,peripheral neuropathy, multilevel cervical degenerative disk disease, central canal stenosis, L DDD    Patient Stated Goals  Pt's goals for therapy are to stop hurting in back and walk better.    Currently in Pain?  No/denies    Pain Score  0-No pain         [facility] Adult PT Treatment/Exercise - 07/30/19 1455      Transfers   Transfers  Sit to Stand;Stand to Sit    Sit to Stand  6: Modified independent (Device/Increase time);Without upper extremity assist;From chair/3-in-1;From bed    Stand to Sit  6: Modified independent (Device/Increase time);Without upper extremity assist;To chair/3-in-1;To bed      Ambulation/Gait   Ambulation/Gait  Yes    Ambulation/Gait Assistance  5: Supervision    Ambulation Distance (Feet)  --   around gym with session   Assistive device  None    Gait Pattern  Step-through pattern;Trendelenburg;Narrow base of support;Poor foot clearance - right    Ambulation Surface  Level;Indoor    Stairs  Yes    Stairs Assistance  4: Min guard    Stairs Assistance Details (indicate cue type and reason)  increased time needed with increased effort. discussed using cane and rail combo at home.     Stair Management Technique  One rail Right;Alternating pattern;Forwards    Number of Stairs  4   x2 reps   Height of Stairs  6      Knee/Hip Exercises: Aerobic   Other Aerobic  Scifit 4 extremities level 4.5 for 6 minutes with goal >/=65 rpm for strengthing and activity tolerance.       Knee/Hip Exercises: Standing   Lateral Step Up   Both;1 set;10 reps;Hand Hold: 2;Step Height: 6";Limitations    Lateral Step Up Limitations  cues for form and technique    Forward Step Up  Both;1 set;10 reps;Hand Hold: 2;Step Height: 6";Limitations    Forward Step Up Limitations  cues for form and technique          Balance Exercises - 07/30/19 1515      Balance Exercises: Standing   Standing Eyes Closed  Wide (BOA);Head turns;Foam/compliant surface;Other reps (comment);30 secs;Limitations    Standing Eyes Closed Limitations  on 2 1 inch foam pads with feet hip width apart:  EC no head movements, then EC head movements left<>right, up<>down, diagonals both ways for 10 reps each way. min guard to min assist for balance.      Partial Tandem Stance  Eyes closed;Upper extremity support 1;3 reps;30 secs;Limitations    Partial Tandem Stance Limitations  3 reps each foot forward with light UE support on chair back, min guard to min assist for balance.           PT Short Term Goals - 07/09/19 1221      PT SHORT TERM GOAL #1   Title  The patient will be indep with HEP for strengthening, balance, and gait.  TARGET 4 weeks, 07/09/2019    Time  4    Period  Weeks    Status  Achieved    Target Date  07/09/19      PT SHORT TERM GOAL #2   Title  Pt will improve TUG score to less than or equal to 25 seconds for decreased fall risk.    Time  4    Period  Weeks    Status  Achieved    Target Date  07/09/19      PT SHORT TERM GOAL #3   Title  Pt will improve 5x sit<>stand to less than or equal to 20 seconds for improved functional lower extremity strength.    Time  4    Period  Weeks    Status  Achieved    Target Date  07/09/19      PT SHORT TERM GOAL #4   Title  Pt will verbalize understanding of fall prevention in home environment.    Time  4    Period  Weeks    Status  Achieved    Target Date  07/09/19        PT Long Term Goals - 07/16/19 1327      PT LONG TERM GOAL #1  Title  The patient will be indep with progression of  HEP.  TARGET 8 weeks, 08/07/2019    Time  8    Period  Weeks    Status  On-going      PT LONG TERM GOAL #2   Title  Pt will improve TUG score to less than or equal to 15 seconds for decreased fall risk.    Baseline  18.69 sec 07/07/2019 (initial LTG met)    Time  8    Period  Weeks    Status  Revised      PT LONG TERM GOAL #3   Title  Pt will improve 5x sit<>stand to less than or equal to 15 seconds for improved functional strength.    Baseline  5x:  16.44 sec 07/07/19 (initial LTG met)    Time  8    Period  Weeks    Status  Revised      PT LONG TERM GOAL #4   Title  Pt will improve gait velocity 2.5 ft/sec for improved gait efficiency and decreased fall risk.    Baseline  2.29 ft/sec 07/16/2019 (initial LTG met)    Time  8    Period  Weeks    Status  Revised      PT LONG TERM GOAL #5   Title  Pt will verbalize understanding of posture/postiioning with ADLs to decrease back pain with ADLs and functional activities.    Time  8    Period  Weeks    Status  On-going            Plan - 07/30/19 1454    Clinical Impression Statement  Today's skilled session continued to focus on strengthening and balance with rest breaks needed due to fatigue. No increase in pain report. The pt is making steady progress toward goals and should benefit from continued PT to progress toward unmet goals.    Personal Factors and Comorbidities  Comorbidity 3+    Comorbidities  PMH chronic LBP radiating into R hip and R leg; R THR 01/2018, anemia, arthritis, CABG, HTN,peripheral neuropathy, multilevel cervical degenerative disk disease, central canal stenosis, L DDD    Examination-Activity Limitations  Locomotion Level;Transfers    Examination-Participation Restrictions  Community Activity    Stability/Clinical Decision Making  Evolving/Moderate complexity    Rehab Potential  Good    PT Frequency  2x / week    PT Duration  8 weeks   including eval week   PT Treatment/Interventions  ADLs/Self Care  Home Management;DME Instruction;Gait training;Functional mobility training;Therapeutic activities;Therapeutic exercise;Balance training;Neuromuscular re-education;Patient/family education;Manual techniques;Electrical Stimulation;Moist Heat    PT Next Visit Plan  begin checking LTGs due 08/07/19.    PT Home Exercise Plan  Access Code: [email or web address]    Consulted and Agree with Plan of Care  Patient       Patient will benefit from skilled therapeutic intervention in order to improve the following deficits and impairments:  Abnormal gait, Difficulty walking, Decreased balance, Decreased mobility, Decreased strength, Pain  Visit Diagnosis: Muscle weakness (generalized)  Unsteadiness on feet  Other abnormalities of gait and mobility  Pain in right leg     Problem List Patient Active Problem List   Diagnosis Date Noted  . Multiple myeloma ([place]) 05/26/2018  . Goals of care, counseling/discussion 05/26/2018  . Obese 01/29/2018  . S/P right THA, AA 01/28/2018  . S/P hip replacement 01/28/2018  . Carotid artery disease ([place]) 04/12/2017  . Passed out 10/05/2015  . Lumbosacral radiculopathy 09/10/2014  .  Cervical stenosis of spinal canal 09/10/2014  . Abnormality of gait 07/27/2014  . Paresthesia 07/27/2014  . Bilateral leg cramps 02/21/2014  . Coronary artery disease 01/13/2013  . Status post coronary artery bypass grafting: 2007 01/13/2013  . Peripheral vascular disease:  Moderate right and mild left ICA stenosis. 01/13/2013  . Essential hypertension 01/13/2013  . Elevated cholesterol   . Menopause 02/12/2011  . Leiomyoma of body of uterus 02/12/2011  . Post-menopausal atrophic vaginitis 02/12/2011  . Urinary frequency 02/12/2011  . UNSPECIFIED PERIPHERAL VASCULAR DISEASE 07/11/2010    Willow Ora, PTA, [facility] [address] [place], [place] [address] [phone] 07/31/19, 9:04 PM   Name: ABEL RA MRN: [id number] Date of Birth:  29-Sep-1933

## 2019-08-04 ENCOUNTER — Encounter: Payer: Self-pay | Admitting: Physical Therapy

## 2019-08-04 ENCOUNTER — Other Ambulatory Visit: Payer: Self-pay

## 2019-08-04 ENCOUNTER — Ambulatory Visit: Payer: Medicare HMO | Admitting: Physical Therapy

## 2019-08-04 DIAGNOSIS — R2689 Other abnormalities of gait and mobility: Secondary | ICD-10-CM | POA: Diagnosis not present

## 2019-08-04 DIAGNOSIS — R2681 Unsteadiness on feet: Secondary | ICD-10-CM | POA: Diagnosis not present

## 2019-08-04 DIAGNOSIS — M79604 Pain in right leg: Secondary | ICD-10-CM | POA: Diagnosis not present

## 2019-08-04 DIAGNOSIS — M6281 Muscle weakness (generalized): Secondary | ICD-10-CM

## 2019-08-04 NOTE — Patient Instructions (Addendum)
Avoid Twisting    Avoid twisting or bending back. Pivot around using foot movements, and bend at knees if needed when reaching for articles.   Copyright  VHI. All rights reserved.  Alternating Positions    Alternate tasks and change positions frequently to reduce fatigue and muscle tension. Take rest breaks.   Copyright  VHI. All rights reserved.  Bending    Bend at hips and knees, not back. Keep feet shoulder-width apart.   Copyright  VHI. All rights reserved.  Getting Into / Out of Bed    Lower self to lie down on one side by raising legs and lowering head at the same time. Use arms to assist moving without twisting. Bend both knees to roll onto back if desired. To sit up, start from lying on side, and use same move-ments in reverse. Keep trunk aligned with legs.   Copyright  VHI. All rights reserved.  Getting Into / Out of Car    Lower self onto seat, scoot back, then bring in one leg at a time. Reverse sequence to get out.   Copyright  VHI. All rights reserved.  Home / Work Management: Oceanographer items at waist level so that bending and twisting hips can be avoided.   Copyright  VHI. All rights reserved.  HOUSEHOLD: Washer / Paramedic from washer to dryer using one hand. Place other hand on dryer for stability if needed.  Copyright  VHI. All rights reserved.  Home Management: Loading / Unloading Dishwasher    Move dishes in small stacks. Bend your knees, not your back. For small items, rest arm on counter, opposite leg back. Stay close to side when sliding shelves.  Copyright  VHI. All rights reserved.  HOUSEHOLD: Vacuuming    Use one arm to vacuum floor. Shift weight foot to foot "dance with it" so not to bend the back.    Copyright  VHI. All rights reserved.

## 2019-08-05 NOTE — Therapy (Signed)
[facility] [address] [place], [place], [address] Phone: [phone]   Fax:  [phone]  Physical Therapy Treatment  Patient Details  Name: Tara Potts MRN: [id number] Date of Birth: 1934/01/18 Referring Provider (PT): Butler Denmark, NP   Encounter Date: 08/04/2019  PT End of Session - 08/04/19 1538    Visit Number  13    Number of Visits  17    Date for PT Re-Evaluation  09/15/19    Authorization Type  Medicare/BCBS    PT Start Time  1535    PT Stop Time  1613    PT Time Calculation (min)  38 min    Equipment Utilized During Treatment  Gait belt   use of R heel wedge   Activity Tolerance  Patient tolerated treatment well;No increased pain   Pt fatigued by end of session   Behavior During Therapy  [facility] for tasks assessed/performed       Past Medical History:  Diagnosis Date  . Anemia   . Arthritis   . Asthma    as a child  . CAD (coronary artery disease)   . Carotid disease, bilateral (HCC)    mild  . Fibroid   . Hx of CABG 2007   [facility]  . Hyperlipemia   . Hypertension   . Menopausal symptoms   . Peripheral neuropathy   . Seizure-like activity [facility])     Past Surgical History:  Procedure Laterality Date  . CARDIAC CATHETERIZATION  01/09/2008   diffusely diseased graft to the PDA & diffuse PDA disease  . CAROTID DOPPLER  08/27/2011   mod. right & nild left ICA stenosis  . CATARACT SURGERY    . CORONARY ARTERY BYPASS GRAFT    . FOOT SURGERY    . JOINT REPLACEMENT     Right hip Dr. Alvan Dame 01-28-18   . MASS REMOVED FROM CERVIX  12/1997   Benign endocervical inclusion cysts  . NM MYOVIEW LTD  01/12/2011   no ischemia  . TOTAL HIP ARTHROPLASTY Right 01/28/2018   Procedure: RIGHT TOTAL HIP ARTHROPLASTY ANTERIOR APPROACH;  Surgeon: Paralee Cancel, MD;  Location: [facility];  Service: Orthopedics;  Laterality: Right;  70 mins  . TRIPLE HEART BYPASS  05/2006   [facility]  . US  ECHOCARDIOGRAPHY  10/30/2007   mild MA,MR,TR,AOV mildly sclerotic,density oted in the prox asc aorta    There were no vitals filed for this visit.  Subjective Assessment - 08/04/19 1537    Subjective  No new complaints.  No falls or pain to report today.    Pertinent History  PMH chronic LBP radiating into R hip and R leg; R THR 01/2018, anemia, arthritis, CABG, HTN,peripheral neuropathy, multilevel cervical degenerative disk disease, central canal stenosis, L DDD    Patient Stated Goals  Pt's goals for therapy are to stop hurting in back and walk better.    Currently in Pain?  No/denies    Pain Score  0-No pain         [facility] PT Assessment - 08/04/19 1540      Transfers   Transfers  Sit to Stand;Stand to Sit    Sit to Stand  6: Modified independent (Device/Increase time);Without upper extremity assist;From chair/3-in-1;From bed    Five time sit to stand comments   14.66 sec's no UE support using standard height chair    Stand to Sit  6: Modified independent (Device/Increase time);Without upper extremity assist;To chair/3-in-1;To bed  Ambulation/Gait   Ambulation/Gait  Yes    Ambulation/Gait Assistance  5: Supervision    Assistive device  None    Gait Pattern  Step-through pattern;Trendelenburg;Narrow base of support;Poor foot clearance - right    Ambulation Surface  Level;Indoor    Gait velocity  12.87 sec's no AD    Stairs  Yes      Standardized Balance Assessment   Standardized Balance Assessment  Timed Up and Go Test      Timed Up and Go Test   Normal TUG (seconds)  10.37    TUG Comments  No device; scores >13.5 sec indicate increased fall risk; >30 seconds indicate difficulty with ADLs in the home            [facility] Adult PT Treatment/Exercise - 08/04/19 1540      Ambulation/Gait   Stairs Assistance  5: Supervision    Stairs Assistance Details (indicate cue type and reason)  1st rep with step to pattern, then reciprocal pattern with next 3 reps.     Stair  Management Technique  One rail Right;Step to pattern;Alternating pattern;With cane    Number of Stairs  4   X4 reps   Height of Stairs  6      Timed Up and Go Test   TUG  Normal TUG    Normal TUG (seconds)  10.37      Therapeutic Activites    Therapeutic Activities  Other Therapeutic Activities    Other Therapeutic Activities  provided education on body mechanics/safety with household tasks such as dishes, laundry and cleaning floors. Reviewed sleeping positions, sitting posture and general guidelines for improved posture with acitivities. Provided handouts for all items discussed today.              PT Education - 08/04/19 1611    Education Details  progress toward goals; body mechanics/posture with ADL's    Person(s) Educated  Patient    Methods  Explanation;Demonstration;Verbal cues;Handout    Comprehension  Verbalized understanding;Returned demonstration       PT Short Term Goals - 07/09/19 1221      PT SHORT TERM GOAL #1   Title  The patient will be indep with HEP for strengthening, balance, and gait.  TARGET 4 weeks, 07/09/2019    Time  4    Period  Weeks    Status  Achieved    Target Date  07/09/19      PT SHORT TERM GOAL #2   Title  Pt will improve TUG score to less than or equal to 25 seconds for decreased fall risk.    Time  4    Period  Weeks    Status  Achieved    Target Date  07/09/19      PT SHORT TERM GOAL #3   Title  Pt will improve 5x sit<>stand to less than or equal to 20 seconds for improved functional lower extremity strength.    Time  4    Period  Weeks    Status  Achieved    Target Date  07/09/19      PT SHORT TERM GOAL #4   Title  Pt will verbalize understanding of fall prevention in home environment.    Time  4    Period  Weeks    Status  Achieved    Target Date  07/09/19        PT Long Term Goals - 08/04/19 1539      PT LONG TERM GOAL #  1   Title  The patient will be indep with progression of HEP.  TARGET 8 weeks, 08/07/2019     Time  8    Period  Weeks    Status  On-going      PT LONG TERM GOAL #2   Title  Pt will improve TUG score to less than or equal to 15 seconds for decreased fall risk.    Baseline  08/04/19: 10.37 sec's no AD    Time  --    Period  --    Status  Achieved      PT LONG TERM GOAL #3   Title  Pt will improve 5x sit<>stand to less than or equal to 15 seconds for improved functional strength.    Baseline  08/04/19: 14.66 sec's    Time  --    Period  --    Status  Achieved      PT LONG TERM GOAL #4   Title  Pt will improve gait velocity 2.5 ft/sec for improved gait efficiency and decreased fall risk.    Baseline  08/04/19: 2.55 ft/sec no AD    Time  --    Period  --    Status  Achieved      PT LONG TERM GOAL #5   Title  Pt will verbalize understanding of posture/postiioning with ADLs to decrease back pain with ADLs and functional activities.    Baseline  08/04/19: provided education on this today along with handouts.    Time  --    Period  --    Status  On-going            Plan - 08/04/19 1538    Clinical Impression Statement  Today's skilled session focused on progress toward goals with all goals checked met today. Will plan to assess remaining goals (HEP and review of education provided today on body mechanics) at next session for anticipated discharge.    Personal Factors and Comorbidities  Comorbidity 3+    Comorbidities  PMH chronic LBP radiating into R hip and R leg; R THR 01/2018, anemia, arthritis, CABG, HTN,peripheral neuropathy, multilevel cervical degenerative disk disease, central canal stenosis, L DDD    Examination-Activity Limitations  Locomotion Level;Transfers    Examination-Participation Restrictions  Community Activity    Stability/Clinical Decision Making  Evolving/Moderate complexity    Rehab Potential  Good    PT Frequency  2x / week    PT Duration  8 weeks   including eval week   PT Treatment/Interventions  ADLs/Self Care Home Management;DME  Instruction;Gait training;Functional mobility training;Therapeutic activities;Therapeutic exercise;Balance training;Neuromuscular re-education;Patient/family education;Manual techniques;Electrical Stimulation;Moist Heat    PT Next Visit Plan  check remaining goals for anticipated discharge    PT Home Exercise Plan  Access Code: [email or web address]    Consulted and Agree with Plan of Care  Patient       Patient will benefit from skilled therapeutic intervention in order to improve the following deficits and impairments:  Abnormal gait, Difficulty walking, Decreased balance, Decreased mobility, Decreased strength, Pain  Visit Diagnosis: Muscle weakness (generalized)  Unsteadiness on feet  Other abnormalities of gait and mobility  Pain in right leg     Problem List Patient Active Problem List   Diagnosis Date Noted  . Multiple myeloma ([place]) 05/26/2018  . Goals of care, counseling/discussion 05/26/2018  . Obese 01/29/2018  . S/P right THA, AA 01/28/2018  . S/P hip replacement 01/28/2018  . Carotid artery disease ([place]) 04/12/2017  . Passed out  10/05/2015  . Lumbosacral radiculopathy 09/10/2014  . Cervical stenosis of spinal canal 09/10/2014  . Abnormality of gait 07/27/2014  . Paresthesia 07/27/2014  . Bilateral leg cramps 02/21/2014  . Coronary artery disease 01/13/2013  . Status post coronary artery bypass grafting: 2007 01/13/2013  . Peripheral vascular disease:  Moderate right and mild left ICA stenosis. 01/13/2013  . Essential hypertension 01/13/2013  . Elevated cholesterol   . Menopause 02/12/2011  . Leiomyoma of body of uterus 02/12/2011  . Post-menopausal atrophic vaginitis 02/12/2011  . Urinary frequency 02/12/2011  . UNSPECIFIED PERIPHERAL VASCULAR DISEASE 07/11/2010    Willow Ora, PTA, [facility] [address] [place], [place] [address] [phone] 08/05/19, 7:10 PM   Name: Tara Potts MRN: [id number] Date of Birth: [birth year]

## 2019-08-07 ENCOUNTER — Encounter: Payer: Self-pay | Admitting: Physical Therapy

## 2019-08-07 ENCOUNTER — Other Ambulatory Visit: Payer: Self-pay

## 2019-08-07 ENCOUNTER — Ambulatory Visit: Payer: Medicare HMO | Admitting: Physical Therapy

## 2019-08-07 DIAGNOSIS — R2681 Unsteadiness on feet: Secondary | ICD-10-CM | POA: Diagnosis not present

## 2019-08-07 DIAGNOSIS — R2689 Other abnormalities of gait and mobility: Secondary | ICD-10-CM | POA: Diagnosis not present

## 2019-08-07 DIAGNOSIS — M79604 Pain in right leg: Secondary | ICD-10-CM | POA: Diagnosis not present

## 2019-08-07 DIAGNOSIS — M6281 Muscle weakness (generalized): Secondary | ICD-10-CM | POA: Diagnosis not present

## 2019-08-07 NOTE — Patient Instructions (Signed)
AHOY Classes through [employer]:  Video taped versions of AHOY classes are shown on [employer] (GT[employer]) at 8 am and 1 pm Mondays through Fridays. You can also purchase a copy of the AHOY DVD by calling [phone].

## 2019-08-07 NOTE — Therapy (Signed)
[facility] [address] [place], [place], [address] Phone: [phone]   Fax:  [phone]  Physical Therapy Treatment  Patient Details  Name: Tara Potts MRN: [id number] Date of Birth: Jun 10, 1934 Referring Provider (PT): Butler Denmark, NP   Encounter Date: 08/07/2019  PT End of Session - 08/07/19 1418    Visit Number  14    Number of Visits  17    Date for PT Re-Evaluation  09/15/19    Authorization Type  Medicare/BCBS    PT Start Time  1110    PT Stop Time  1148    PT Time Calculation (min)  38 min    Equipment Utilized During Treatment  --   use of R heel wedge   Activity Tolerance  Patient tolerated treatment well;No increased pain   Pt fatigued by end of session   Behavior During Therapy  [facility] for tasks assessed/performed       Past Medical History:  Diagnosis Date  . Anemia   . Arthritis   . Asthma    as a child  . CAD (coronary artery disease)   . Carotid disease, bilateral (HCC)    mild  . Fibroid   . Hx of CABG 2007   [facility]  . Hyperlipemia   . Hypertension   . Menopausal symptoms   . Peripheral neuropathy   . Seizure-like activity [facility])     Past Surgical History:  Procedure Laterality Date  . CARDIAC CATHETERIZATION  01/09/2008   diffusely diseased graft to the PDA & diffuse PDA disease  . CAROTID DOPPLER  08/27/2011   mod. right & nild left ICA stenosis  . CATARACT SURGERY    . CORONARY ARTERY BYPASS GRAFT    . FOOT SURGERY    . JOINT REPLACEMENT     Right hip Dr. Alvan Dame 01-28-18   . MASS REMOVED FROM CERVIX  12/1997   Benign endocervical inclusion cysts  . NM MYOVIEW LTD  01/12/2011   no ischemia  . TOTAL HIP ARTHROPLASTY Right 01/28/2018   Procedure: RIGHT TOTAL HIP ARTHROPLASTY ANTERIOR APPROACH;  Surgeon: Paralee Cancel, MD;  Location: [facility];  Service: Orthopedics;  Laterality: Right;  70 mins  . TRIPLE HEART BYPASS  05/2006   [facility]  . US ECHOCARDIOGRAPHY   10/30/2007   mild MA,MR,TR,AOV mildly sclerotic,density oted in the prox asc aorta    There were no vitals filed for this visit.  Subjective Assessment - 08/07/19 1113    Subjective  No changes, no falls or pain today.    Pertinent History  PMH chronic LBP radiating into R hip and R leg; R THR 01/2018, anemia, arthritis, CABG, HTN,peripheral neuropathy, multilevel cervical degenerative disk disease, central canal stenosis, L DDD    Patient Stated Goals  Pt's goals for therapy are to stop hurting in back and walk better.    Currently in Pain?  No/denies                       [facility] Adult PT Treatment/Exercise - 08/07/19 0001      Ambulation/Gait   Ambulation/Gait  Yes    Ambulation/Gait Assistance  5: Supervision    Ambulation Distance (Feet)  250 Feet    Assistive device  None    Gait Pattern  Step-through pattern;Trendelenburg;Narrow base of support;Poor foot clearance - right    Ambulation Surface  Level;Indoor    Stairs  Yes    Stairs Assistance  5:  Supervision    Stair Management Technique  Two rails;One rail Right;Alternating pattern;Forwards    Number of Stairs  4   x 3   Height of Stairs  6      Self-Care   Self-Care  Other Self-Care Comments    Other Self-Care Comments   Briefly reviewed the posture/body mechanics handouts; provided AHOY information; discussed d/c plans for this visit.        Therapeutic Exercise-Reviewed pt's full HEP this visit (as noted below).  Pt needs cues for correct technique most exercises.  Pt reports not doing standing or supine exercises consistently.  PT offered additional handout, but pt declined, as she has them at home.   Seated Hamstring Stretch - 3 reps - 1 sets - 30 hold - 1x daily - 5x weekly Hooklying Single Leg Bent Knee Fallouts with Resistance - 10 reps - 2-3 sets - 1x daily - 5x weekly Single Knee to Chest Stretch - 3 reps - 1 sets - 30 hold - 1x daily - 5x weekly Supine Lower Trunk Rotation - 3 reps - 1 sets - 30  hold - 1x daily - 5x weekly Sit to Stand with Armchair - 10 reps - 1 sets - 1x daily - 5x weekly   Side Stepping with Counter Support - 3-5 reps - 1x daily - 5x weekly Alternating Step Taps with Counter Support - 10 reps - 1-2 sets - 1x daily - 5x weekly Alternating Step Forward with Support - 10 reps - 1-2 sets - 1x daily - 5x weekly Alternating Step Backward with Support - 10 reps - 1-2 sets - 1x daily - 5x weekly Step Sideways - 10 reps - 1-2 sets - 1x daily - 5x weekly     PT Education - 08/07/19 1418    Education Details  AHOY exercise program on TV, plans for d/c this visit    Person(s) Educated  Patient    Methods  Explanation;Handout    Comprehension  Verbalized understanding       PT Short Term Goals - 07/09/19 1221      PT SHORT TERM GOAL #1   Title  The patient will be indep with HEP for strengthening, balance, and gait.  TARGET 4 weeks, 07/09/2019    Time  4    Period  Weeks    Status  Achieved    Target Date  07/09/19      PT SHORT TERM GOAL #2   Title  Pt will improve TUG score to less than or equal to 25 seconds for decreased fall risk.    Time  4    Period  Weeks    Status  Achieved    Target Date  07/09/19      PT SHORT TERM GOAL #3   Title  Pt will improve 5x sit<>stand to less than or equal to 20 seconds for improved functional lower extremity strength.    Time  4    Period  Weeks    Status  Achieved    Target Date  07/09/19      PT SHORT TERM GOAL #4   Title  Pt will verbalize understanding of fall prevention in home environment.    Time  4    Period  Weeks    Status  Achieved    Target Date  07/09/19        PT Long Term Goals - 08/07/19 1419      PT LONG TERM GOAL #1  Title  The patient will be indep with progression of HEP.  TARGET 8 weeks, 08/07/2019    Baseline  Needs occasional cueing for HEP    Time  8    Period  Weeks    Status  Partially Met      PT LONG TERM GOAL #2   Title  Pt will improve TUG score to less than or equal  to 15 seconds for decreased fall risk.    Baseline  08/04/19: 10.37 sec's no AD    Status  Achieved      PT LONG TERM GOAL #3   Title  Pt will improve 5x sit<>stand to less than or equal to 15 seconds for improved functional strength.    Baseline  08/04/19: 14.66 sec's    Status  Achieved      PT LONG TERM GOAL #4   Title  Pt will improve gait velocity 2.5 ft/sec for improved gait efficiency and decreased fall risk.    Baseline  08/04/19: 2.55 ft/sec no AD    Status  Achieved      PT LONG TERM GOAL #5   Title  Pt will verbalize understanding of posture/postiioning with ADLs to decrease back pain with ADLs and functional activities.    Baseline  08/04/19: provided education on this today along with handouts.    Status  Achieved            Plan - 08/07/19 1420    Clinical Impression Statement  Completed assessment of LTGs, with pt meeting 4 of 5 LTGs.  Pt has partially met HEP, as she reports performing inconsistently at home and she needs occasional cues for correct technique of exercises.  She has improved overall functional mobility measures and is appropriate for d/c at this time.    Personal Factors and Comorbidities  Comorbidity 3+    Comorbidities  PMH chronic LBP radiating into R hip and R leg; R THR 01/2018, anemia, arthritis, CABG, HTN,peripheral neuropathy, multilevel cervical degenerative disk disease, central canal stenosis, L DDD    Examination-Activity Limitations  Locomotion Level;Transfers    Examination-Participation Restrictions  Community Activity    Stability/Clinical Decision Making  Evolving/Moderate complexity    Rehab Potential  Good    PT Frequency  2x / week    PT Duration  8 weeks   including eval week   PT Treatment/Interventions  ADLs/Self Care Home Management;DME Instruction;Gait training;Functional mobility training;Therapeutic activities;Therapeutic exercise;Balance training;Neuromuscular re-education;Patient/family education;Manual techniques;Electrical  Stimulation;Moist Heat    PT Next Visit Plan  D/C this visit    PT Home Exercise Plan  Access Code: [email or web address]    Consulted and Agree with Plan of Care  Patient       Patient will benefit from skilled therapeutic intervention in order to improve the following deficits and impairments:  Abnormal gait, Difficulty walking, Decreased balance, Decreased mobility, Decreased strength, Pain  Visit Diagnosis: Unsteadiness on feet  Other abnormalities of gait and mobility     Problem List Patient Active Problem List   Diagnosis Date Noted  . Multiple myeloma ([place]) 05/26/2018  . Goals of care, counseling/discussion 05/26/2018  . Obese 01/29/2018  . S/P right THA, AA 01/28/2018  . S/P hip replacement 01/28/2018  . Carotid artery disease ([place]) 04/12/2017  . Passed out 10/05/2015  . Lumbosacral radiculopathy 09/10/2014  . Cervical stenosis of spinal canal 09/10/2014  . Abnormality of gait 07/27/2014  . Paresthesia 07/27/2014  . Bilateral leg cramps 02/21/2014  . Coronary artery disease 01/13/2013  .  Status post coronary artery bypass grafting: 2007 01/13/2013  . Peripheral vascular disease:  Moderate right and mild left ICA stenosis. 01/13/2013  . Essential hypertension 01/13/2013  . Elevated cholesterol   . Menopause 02/12/2011  . Leiomyoma of body of uterus 02/12/2011  . Post-menopausal atrophic vaginitis 02/12/2011  . Urinary frequency 02/12/2011  . UNSPECIFIED PERIPHERAL VASCULAR DISEASE 07/11/2010    Frazier Butt. 08/07/2019, 2:22 PM  Frazier Butt., PT   [facility] [address] [place], [place], [address] Phone: [phone]   Fax:  [phone]  Name: PATSY ZARAGOZA MRN: [id number] Date of Birth: Jul 03, 1934   PHYSICAL THERAPY DISCHARGE SUMMARY  Visits from Start of Care: 14  Current functional level related to goals / functional outcomes: PT Long Term Goals - 08/07/19 1419      PT LONG TERM GOAL #1    Title  The patient will be indep with progression of HEP.  TARGET 8 weeks, 08/07/2019    Baseline  Needs occasional cueing for HEP    Time  8    Period  Weeks    Status  Partially Met      PT LONG TERM GOAL #2   Title  Pt will improve TUG score to less than or equal to 15 seconds for decreased fall risk.    Baseline  08/04/19: 10.37 sec's no AD    Status  Achieved      PT LONG TERM GOAL #3   Title  Pt will improve 5x sit<>stand to less than or equal to 15 seconds for improved functional strength.    Baseline  08/04/19: 14.66 sec's    Status  Achieved      PT LONG TERM GOAL #4   Title  Pt will improve gait velocity 2.5 ft/sec for improved gait efficiency and decreased fall risk.    Baseline  08/04/19: 2.55 ft/sec no AD    Status  Achieved      PT LONG TERM GOAL #5   Title  Pt will verbalize understanding of posture/postiioning with ADLs to decrease back pain with ADLs and functional activities.    Baseline  08/04/19: provided education on this today along with handouts.    Status  Achieved      Pt has met 4 of 5 LTGs.   Remaining deficits: Fatigue, high level balance (improved)   Education / Equipment: Educated in fall prevention, walking program, and community fitness options.  Plan: Patient agrees to discharge.  Patient goals were met. Patient is being discharged due to meeting the stated rehab goals.  ?????   Mady Haagensen, PT 08/07/19 2:26 PM Phone: [phone] Fax: [phone]

## 2019-09-14 DIAGNOSIS — M25551 Pain in right hip: Secondary | ICD-10-CM | POA: Diagnosis not present

## 2019-09-14 DIAGNOSIS — Z96641 Presence of right artificial hip joint: Secondary | ICD-10-CM | POA: Diagnosis not present

## 2019-09-14 DIAGNOSIS — M7061 Trochanteric bursitis, right hip: Secondary | ICD-10-CM | POA: Diagnosis not present

## 2019-10-19 ENCOUNTER — Encounter: Payer: Self-pay | Admitting: Internal Medicine

## 2019-10-19 DIAGNOSIS — D649 Anemia, unspecified: Secondary | ICD-10-CM | POA: Diagnosis not present

## 2019-10-19 DIAGNOSIS — I10 Essential (primary) hypertension: Secondary | ICD-10-CM | POA: Diagnosis not present

## 2019-10-19 DIAGNOSIS — E7800 Pure hypercholesterolemia, unspecified: Secondary | ICD-10-CM | POA: Diagnosis not present

## 2019-10-22 DIAGNOSIS — R252 Cramp and spasm: Secondary | ICD-10-CM | POA: Diagnosis not present

## 2019-10-22 DIAGNOSIS — R079 Chest pain, unspecified: Secondary | ICD-10-CM | POA: Diagnosis not present

## 2019-10-22 DIAGNOSIS — R413 Other amnesia: Secondary | ICD-10-CM | POA: Diagnosis not present

## 2019-10-22 DIAGNOSIS — I872 Venous insufficiency (chronic) (peripheral): Secondary | ICD-10-CM | POA: Diagnosis not present

## 2019-10-22 DIAGNOSIS — I739 Peripheral vascular disease, unspecified: Secondary | ICD-10-CM | POA: Diagnosis not present

## 2019-10-22 DIAGNOSIS — N183 Chronic kidney disease, stage 3 unspecified: Secondary | ICD-10-CM | POA: Diagnosis not present

## 2019-10-22 DIAGNOSIS — R600 Localized edema: Secondary | ICD-10-CM | POA: Diagnosis not present

## 2019-10-22 DIAGNOSIS — I10 Essential (primary) hypertension: Secondary | ICD-10-CM | POA: Diagnosis not present

## 2019-10-22 DIAGNOSIS — D631 Anemia in chronic kidney disease: Secondary | ICD-10-CM | POA: Diagnosis not present

## 2019-10-23 ENCOUNTER — Other Ambulatory Visit: Payer: Self-pay

## 2019-10-23 ENCOUNTER — Ambulatory Visit: Payer: Medicare HMO | Admitting: Cardiovascular Disease

## 2019-10-23 ENCOUNTER — Encounter: Payer: Self-pay | Admitting: Cardiovascular Disease

## 2019-10-23 VITALS — BP 158/60 | HR 84 | Wt 203.4 lb

## 2019-10-23 DIAGNOSIS — Z951 Presence of aortocoronary bypass graft: Secondary | ICD-10-CM

## 2019-10-23 DIAGNOSIS — Z79899 Other long term (current) drug therapy: Secondary | ICD-10-CM | POA: Diagnosis not present

## 2019-10-23 DIAGNOSIS — R600 Localized edema: Secondary | ICD-10-CM

## 2019-10-23 DIAGNOSIS — E7800 Pure hypercholesterolemia, unspecified: Secondary | ICD-10-CM

## 2019-10-23 DIAGNOSIS — I739 Peripheral vascular disease, unspecified: Secondary | ICD-10-CM

## 2019-10-23 NOTE — Patient Instructions (Addendum)
Medication Instructions:  INCREASE furosemide (Lasix) to 40 mg two times daily for 1 week ---then resume 40 mg once daily  *If you need a refill on your cardiac medications before your next appointment, please call your pharmacy*   Lab Work: [facility] in [place]  If you have labs (blood work) drawn today and your tests are completely normal, you will receive your results only by: [name] MyChart Message (if you have MyChart) OR . A paper copy in the mail If you have any lab test that is abnormal or we need to change your treatment, we will call you to review the results.   Testing/Procedures: NONE  Follow-Up: At [facility], you and your health needs are our priority.  As part of our continuing mission to provide you with exceptional heart care, we have created designated Provider Care Teams.  These Care Teams include your primary Cardiologist (physician) and Advanced Practice Providers (APPs -  Physician Assistants and Nurse Practitioners) who all work together to provide you with the care you need, when you need it.  We recommend signing up for the patient portal called "MyChart".  Sign up information is provided on this After Visit Summary.  MyChart is used to connect with patients for Virtual Visits (Telemedicine).  Patients are able to view lab/test results, encounter notes, upcoming appointments, etc.  Non-urgent messages can be sent to your provider as well.   To learn more about what you can do with MyChart, go to [email or web address].    Your next appointment:   1 month(s)  The format for your next appointment:   In Person  Provider:    Kerin Ransom, PA-C  Sande Rives, PA-C  Coletta Memos, FNP   Dr. Gwenlyn Found in 6 months   You have been referred to Dr. Debara Pickett (lipid clinic)

## 2019-10-23 NOTE — Assessment & Plan Note (Signed)
History of hyperlipidemia on statin therapy with lipid profile performed 04/20/2019 revealing total cholesterol of 218, LDL 146 and HDL 57 on high-dose atorvastatin.  We are going to refer her to Dr. Lysbeth Penner lipid clinic for initiation of PCSK9.

## 2019-10-23 NOTE — Assessment & Plan Note (Signed)
History of bilateral ICA stenosis, moderate on the right and mild on the left by recent duplex ultrasound 04/22/2019.  We will repeat that this coming October.

## 2019-10-23 NOTE — Assessment & Plan Note (Signed)
History of CAD status post coronary artery bypass grafting November 2007 at [facility].  She did have a Myoview performed 01/12/2011 which was nonischemic.  Her last catheterization performed by myself 01/09/2008 revealed diffusely diseased graft to the PDA as well as a diffusely diseased P PDA which I decided to treat medically.  She denies chest pain.

## 2019-10-23 NOTE — Assessment & Plan Note (Signed)
History of essential hypertension blood pressure measured today 158/60.  She is on losartan, hydrochlorothiazide and metoprolol.

## 2019-10-23 NOTE — Progress Notes (Signed)
10/23/2019 Tara Potts   1934/02/10  [id number]  Primary Physician [name], MD Primary Cardiologist: Lorretta Harp MD FACP, [place], [place], [place]  HPI:  Tara Potts is a 86 y.o.   mildly overweight, widowed [place] American female, mother of 2, grandmother to 1 grandchild who I saw in the office  04/28/2019.Tara Potts Her husband [name] was also a patient of mine and is deceased. She has a history of CAD status post coronary artery bypass grafting November 2007 at [facility]. Her other problems include hypertension, hyperlipidemia and mild carotid disease. She had a Myoview stress test performed January 12, 2011, which was nonischemic. Carotid Dopplers performed August 27, 2011, showed moderate right and mild left ICA stenosis scheduled to be redone in February of next year. I last cath'd her January 09, 2008, revealing diffusely diseased graft to the PDA as well as diffuse PDA disease and I elected to treat her medically at that time. Her major complaints are nocturnal leg cramps. Since I saw her a year ago she has remained asymptomatic denying chest pain or shortness of breath.  She had a right total hip replacement by Dr.Olinin July 2019 which she has recuperated from nicely.  She is currently walking with a cane because of left hip pain.  Since I saw her 8 months ago she continues to do well.  She still walking with a cane and is never fully recovered from her right total hip replacement.  She complains of chronic bilateral leg pain but does have intact pedal pulses.  Did not have this is vascular.  She also has mild to moderate bilateral lower extremity edema is gained 10 pounds since I last saw her..   Current Meds  Medication Sig  . Ascorbic Acid (VITAMIN C PO) Take 1 tablet by mouth 2 (two) times daily.   Tara Potts aspirin EC 81 MG tablet Take 81 mg by mouth daily.  Tara Potts atorvastatin (LIPITOR) 80 MG tablet Take 80 mg by mouth daily.  . Calcium-Magnesium-Vitamin D (CALCIUM MAGNESIUM PO)  Take 1 tablet by mouth 2 (two) times daily.   . Cholecalciferol (VITAMIN D) 2000 units tablet Take 2,000 Units by mouth daily.  . Coenzyme Q10 (COQ10 PO) Take 1 capsule by mouth daily.  . furosemide (LASIX) 40 MG tablet Take 40 mg by mouth daily.   Tara Potts GARLIC PO Take 1 tablet by mouth 2 (two) times daily.   . Glucosamine HCl (GLUCOSAMINE PO) Take 1 tablet by mouth daily.  Tara Potts losartan-hydrochlorothiazide (HYZAAR) 50-12.5 MG tablet Take 1 tablet by mouth daily.  . Magnesium 250 MG TABS Take 250 mg by mouth daily.  . metoprolol tartrate (LOPRESSOR) 50 MG tablet TAKE 1 TABLET(50 MG) BY MOUTH TWICE DAILY  . potassium chloride (K-DUR) 10 MEQ tablet TAKE 1 TABLET(10 MEQ) BY MOUTH DAILY     Allergies  Allergen Reactions  . Cymbalta [Duloxetine Hcl] Nausea Only    Social History   Socioeconomic History  . Marital status: Widowed    Spouse name: Not on file  . Number of children: 1  . Years of education: college  . Highest education level: Not on file  Occupational History  . Not on file  Tobacco Use  . Smoking status: Former Research scientist (life sciences)  . Smokeless tobacco: Never Used  . Tobacco comment: Quit 30 years ago  Substance and Sexual Activity  . Alcohol use: No    Alcohol/week: 0.0 standard drinks  . Drug use: No  . Sexual activity: Not Currently  Birth control/protection: Post-menopausal    Comment: intercourse age 44, more than 5 sexual partners  Other Topics Concern  . Not on file  Social History Narrative   Patient lives at home and her daughter and son in law live with her. Widowed.   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   Education college.   Left handed.   Caffeine     Social Determinants of Health   Financial Resource Strain:   . Difficulty of Paying Living Expenses:   Food Insecurity:   . Worried About Charity fundraiser in the Last Year:   . Arboriculturist in the Last Year:   Transportation Needs:   . Film/video editor (Medical):   Tara Potts Lack of Transportation  (Non-Medical):   Physical Activity:   . Days of Exercise per Week:   . Minutes of Exercise per Session:   Stress:   . Feeling of Stress :   Social Connections:   . Frequency of Communication with Friends and Family:   . Frequency of Social Gatherings with Friends and Family:   . Attends Religious Services:   . Active Member of Clubs or Organizations:   . Attends Archivist Meetings:   Tara Potts Marital Status:   Intimate Partner Violence:   . Fear of Current or Ex-Partner:   . Emotionally Abused:   Tara Potts Physically Abused:   . Sexually Abused:      Review of Systems: General: negative for chills, fever, night sweats or weight changes.  Cardiovascular: negative for chest pain, dyspnea on exertion, edema, orthopnea, palpitations, paroxysmal nocturnal dyspnea or shortness of breath Dermatological: negative for rash Respiratory: negative for cough or wheezing Urologic: negative for hematuria Abdominal: negative for nausea, vomiting, diarrhea, bright red blood per rectum, melena, or hematemesis Neurologic: negative for visual changes, syncope, or dizziness All other systems reviewed and are otherwise negative except as noted above.    Blood pressure (!) 158/60, pulse 84, weight 203 lb 6.4 oz (92.3 kg), last menstrual period 07/16/2002, SpO2 99 %.  General appearance: alert and no distress Neck: no adenopathy, no carotid bruit, no JVD, supple, symmetrical, trachea midline and thyroid not enlarged, symmetric, no tenderness/mass/nodules Lungs: clear to auscultation bilaterally Heart: regular rate and rhythm, S1, S2 normal, no murmur, click, rub or gallop Extremities: 1-2+ bilateral lower extremity edema Pulses: 2+ and symmetric Skin: Skin color, texture, turgor normal. No rashes or lesions Neurologic: Alert and oriented X 3, normal strength and tone. Normal symmetric reflexes. Normal coordination and gait  EKG sinus rhythm at 68 with nonspecific ST and T wave changes.  I personally  reviewed this EKG.  ASSESSMENT AND PLAN:   Elevated cholesterol History of hyperlipidemia on statin therapy with lipid profile performed 04/20/2019 revealing total cholesterol of 218, LDL 146 and HDL 57 on high-dose atorvastatin.  We are going to refer her to Dr. Lysbeth Penner lipid clinic for initiation of PCSK9.  Status post coronary artery bypass grafting: 2007 History of CAD status post coronary artery bypass grafting November 2007 at [facility].  She did have a Myoview performed 01/12/2011 which was nonischemic.  Her last catheterization performed by myself 01/09/2008 revealed diffusely diseased graft to the PDA as well as a diffusely diseased P PDA which I decided to treat medically.  She denies chest pain.  Peripheral vascular disease:  Moderate right and mild left ICA stenosis. History of bilateral ICA stenosis, moderate on the right and mild on the left by recent duplex ultrasound 04/22/2019.  We will repeat that this coming October.  Essential hypertension History of essential hypertension blood pressure measured today 158/60.  She is on losartan, hydrochlorothiazide and metoprolol.  Bilateral lower extremity edema Increased bilateral lower extremity edema.  She is on furosemide 40 mg once a day and has a serum creatinine when last checked of approxione 0.4.  An increase her furosemide to twice daily for a week then back to once a day.  We will check a basic metabolic panel in 2 weeks and have her see an APP back in 4 weeks in follow-up.  She does not admit to increased sodium intake.      Lorretta Harp MD FACP,FACC,FAHA, [facility] 10/23/2019 4:40 PM

## 2019-10-23 NOTE — Assessment & Plan Note (Signed)
Increased bilateral lower extremity edema.  She is on furosemide 40 mg once a day and has a serum creatinine when last checked of approxione 0.4.  An increase her furosemide to twice daily for a week then back to once a day.  We will check a basic metabolic panel in 2 weeks and have her see an APP back in 4 weeks in follow-up.  She does not admit to increased sodium intake.

## 2019-11-05 DIAGNOSIS — Z79899 Other long term (current) drug therapy: Secondary | ICD-10-CM | POA: Diagnosis not present

## 2019-11-05 DIAGNOSIS — R600 Localized edema: Secondary | ICD-10-CM | POA: Diagnosis not present

## 2019-11-05 DIAGNOSIS — R413 Other amnesia: Secondary | ICD-10-CM | POA: Diagnosis not present

## 2019-11-05 DIAGNOSIS — I10 Essential (primary) hypertension: Secondary | ICD-10-CM | POA: Diagnosis not present

## 2019-11-05 DIAGNOSIS — E7800 Pure hypercholesterolemia, unspecified: Secondary | ICD-10-CM | POA: Diagnosis not present

## 2019-11-07 ENCOUNTER — Other Ambulatory Visit: Payer: Self-pay | Admitting: Cardiovascular Disease

## 2019-11-10 NOTE — Telephone Encounter (Signed)
Rx(s) sent to pharmacy electronically.  

## 2019-11-20 ENCOUNTER — Encounter: Payer: Self-pay | Admitting: Cardiology

## 2019-11-20 ENCOUNTER — Ambulatory Visit (INDEPENDENT_AMBULATORY_CARE_PROVIDER_SITE_OTHER): Payer: Medicare HMO | Admitting: Cardiology

## 2019-11-20 ENCOUNTER — Other Ambulatory Visit: Payer: Self-pay

## 2019-11-20 VITALS — BP 152/56 | HR 76 | Temp 97.1°F | Ht 67.0 in | Wt 201.0 lb

## 2019-11-20 DIAGNOSIS — R600 Localized edema: Secondary | ICD-10-CM

## 2019-11-20 DIAGNOSIS — I10 Essential (primary) hypertension: Secondary | ICD-10-CM

## 2019-11-20 DIAGNOSIS — I6523 Occlusion and stenosis of bilateral carotid arteries: Secondary | ICD-10-CM | POA: Diagnosis not present

## 2019-11-20 DIAGNOSIS — E785 Hyperlipidemia, unspecified: Secondary | ICD-10-CM | POA: Diagnosis not present

## 2019-11-20 DIAGNOSIS — R0602 Shortness of breath: Secondary | ICD-10-CM | POA: Diagnosis not present

## 2019-11-20 DIAGNOSIS — N183 Chronic kidney disease, stage 3 unspecified: Secondary | ICD-10-CM

## 2019-11-20 DIAGNOSIS — Z951 Presence of aortocoronary bypass graft: Secondary | ICD-10-CM

## 2019-11-20 DIAGNOSIS — Z79899 Other long term (current) drug therapy: Secondary | ICD-10-CM | POA: Diagnosis not present

## 2019-11-20 MED ORDER — FUROSEMIDE 40 MG PO TABS: 40.0000 mg | ORAL_TABLET | Freq: Every day | ORAL | 3 refills | Status: DC

## 2019-11-20 NOTE — Assessment & Plan Note (Signed)
Moderate bilateral ICA stenosis

## 2019-11-20 NOTE — Patient Instructions (Signed)
Medication Instructions:  Your physician recommends that you continue on your current medications as directed. Please refer to the Current Medication list given to you today.  *If you need a refill on your cardiac medications before your next appointment, please call your pharmacy*   Lab Work: BMET today If you have labs (blood work) drawn today and your tests are completely normal, you will receive your results only by: [name] MyChart Message (if you have MyChart) OR . A paper copy in the mail If you have any lab test that is abnormal or we need to change your treatment, we will call you to review the results.   Testing/Procedures: Your physician has requested that you have an echocardiogram. Echocardiography is a painless test that uses sound waves to create images of your heart. It provides your doctor with information about the size and shape of your heart and how well your heart's chambers and valves are working. This procedure takes approximately one hour. There are no restrictions for this procedure.  This will be done at our [facility] location:  [place]: At [employer], you and your health needs are our priority.  As part of our continuing mission to provide you with exceptional heart care, we have created designated Provider Care Teams.  These Care Teams include your primary Cardiologist (physician) and Advanced Practice Providers (APPs -  Physician Assistants and Nurse Practitioners) who all work together to provide you with the care you need, when you need it.  We recommend signing up for the patient portal called "MyChart".  Sign up information is provided on this After Visit Summary.  MyChart is used to connect with patients for Virtual Visits (Telemedicine).  Patients are able to view lab/test results, encounter notes, upcoming appointments, etc.  Non-urgent messages can be sent to your provider as well.   To learn more about what you can do  with MyChart, go to [email or web address].    Your next appointment:   5/28 virtual visit at 9:45 AM with Kerin Ransom PA    How to Use Compression Stockings Compression stockings are elastic socks that squeeze the legs. They help increase blood flow (circulation) to the legs, decrease swelling in the legs, and reduce the chance of developing blood clots in the lower legs. Compression stockings are often used by people who:  Are recovering from surgery.  Have poor circulation in their legs.  Tend to get blood clots in their legs.  Have bulging (varicose) veins.  Sit or stay in bed for long periods of time. Follow instructions from your health care provider about how and when to wear your compression stockings. How to wear compression stockings Before you put on your compression stockings:  Make sure that they are the correct size and degree of compression. If you do not know your size or required grade of compression, ask your health care provider and follow the manufacturer's instructions that come with the stockings.  Make sure that they are clean, dry, and in good condition.  Check them for rips and tears. Do not put them on if they are ripped or torn. Put your stockings on first thing in the morning, before you get out of bed. Keep them on for as long as your health care provider advises. When you are wearing your stockings:  Keep them as smooth as possible. Do not allow them to bunch up. It is especially important to prevent the stockings from bunching up around  your toes or behind your knees.  Do not roll the stockings downward and leave them rolled down. This can decrease blood flow to your leg.  Change them right away if they become wet or dirty. When you take off your stockings, inspect your legs and feet. Check for:  Open sores.  Red spots.  Swelling. General tips  Do not stop wearing compression stockings without talking to your health care provider first.   Wash your stockings every day with mild detergent in cold or warm water. Do not use bleach. Air-dry your stockings or dry them in a clothes dryer on low heat. It may be helpful to have two pairs so that you have a pair to wear while the other is being washed.  Replace your stockings every 3-6 months.  If skin moisturizing is part of your treatment plan, apply lotion or cream at night so that your skin will be dry when you put on the stockings in the morning. It is harder to put the stockings on when you have lotion on your legs or feet.  Wear nonskid shoes or slip-resistant socks when walking while wearing compression stockings. Contact a health care provider and remove your stockings if you have:  A feeling of pins and needles in your feet or legs.  Open sores, red spots, or other skin changes on your feet or legs.  Swelling or pain that gets worse. Get help right away if you have:  Numbness or tingling in your lower legs that does not get better right after you take the stockings off.  Toes or feet that are unusually cold or turn a bluish color.  A warm or red area on your leg.  New swelling or soreness in your leg.  Shortness of breath.  Chest pain.  A fast or irregular heartbeat.  Light-headedness.  Dizziness. Summary  Compression stockings are elastic socks that squeeze the legs.  They help increase blood flow (circulation) to the legs, decrease swelling in the legs, and reduce the chance of developing blood clots in the lower legs.  Follow instructions from your health care provider about how and when to wear your compression stockings.  Do not stop wearing your compression stockings without talking to your health care provider first. This information is not intended to replace advice given to you by your health care provider. Make sure you discuss any questions you have with your health care provider. Document Revised: 07/04/2017 Document Reviewed: 07/04/2017  Elsevier Patient Education  2020 [employer].

## 2019-11-20 NOTE — Progress Notes (Signed)
Cardiology Office Note:    Date:  11/20/2019   ID:  Tara Potts, DOB 26-May-1934, MRN [id number]  PCP:  [name], MD  Cardiologist:  Quay Burow, MD  Electrophysiologist:  None   Referring MD: [name], MD   Chief Complaint  Patient presents with  . Follow-up    1 month.  . Edema    Feet and ankles.  . Chest Pain  . Shortness of Breath    History of Present Illness:    Tara Potts is a 86 y.o. female with a hx of coronary disease, status post coronary artery bypass grafting in 2007.  Catheterization in 2009 showed distal native disease treated medically.  Myoview in 2012 was low risk.  Her last echo was in 2009 and showed preserved LV function.  Other medical issues include hypertension, dyslipidemia, and vascular disease with moderate bilateral internal carotid artery narrowing. Dr Gwenlyn Found saw her in the office recently with complaints of lower extremity edema and some shortness of breath.  He had her increase her furosemide from 40 mg a day to 40 mg twice daily for a week and then come back for follow-up.  I am seeing the patient today.  I reviewed the recommendations from Dr. Gwenlyn Found, she says that she did increase her Lasix.  Her feet are still swollen.  She is not particularly short of breath.  She did tell me she is now out of her furosemide.  Past Medical History:  Diagnosis Date  . Anemia   . Arthritis   . Asthma    as a child  . CAD (coronary artery disease)   . Carotid disease, bilateral (HCC)    mild  . Fibroid   . Hx of CABG 2007   [facility]  . Hyperlipemia   . Hypertension   . Menopausal symptoms   . Peripheral neuropathy   . Seizure-like activity [facility])     Past Surgical History:  Procedure Laterality Date  . CARDIAC CATHETERIZATION  01/09/2008   diffusely diseased graft to the PDA & diffuse PDA disease  . CAROTID DOPPLER  08/27/2011   mod. right & nild left ICA stenosis  . CATARACT SURGERY    . CORONARY ARTERY BYPASS GRAFT    . FOOT  SURGERY    . JOINT REPLACEMENT     Right hip Dr. Alvan Dame 01-28-18   . MASS REMOVED FROM CERVIX  12/1997   Benign endocervical inclusion cysts  . NM MYOVIEW LTD  01/12/2011   no ischemia  . TOTAL HIP ARTHROPLASTY Right 01/28/2018   Procedure: RIGHT TOTAL HIP ARTHROPLASTY ANTERIOR APPROACH;  Surgeon: Paralee Cancel, MD;  Location: [facility] ORS;  Service: Orthopedics;  Laterality: Right;  70 mins  . TRIPLE HEART BYPASS  05/2006   [facility]  . US ECHOCARDIOGRAPHY  10/30/2007   mild MA,MR,TR,AOV mildly sclerotic,density oted in the prox asc aorta    Current Medications: Current Meds  Medication Sig  . Ascorbic Acid (VITAMIN C PO) Take 1 tablet by mouth 2 (two) times daily.   [name] aspirin EC 81 MG tablet Take 81 mg by mouth daily.  [name] atorvastatin (LIPITOR) 80 MG tablet Take 80 mg by mouth daily.  . Calcium-Magnesium-Vitamin D (CALCIUM MAGNESIUM PO) Take 1 tablet by mouth daily.   . Cholecalciferol (VITAMIN D) 2000 units tablet Take 2,000 Units by mouth daily.  . Coenzyme Q10 (COQ10 PO) Take 1 capsule by mouth daily.  . furosemide (LASIX) 40 MG tablet Take 40 mg by mouth daily.   [name]  GARLIC PO Take 1 tablet by mouth 2 (two) times daily.   . Glucosamine HCl (GLUCOSAMINE PO) Take 1 tablet by mouth daily.  [name] losartan-hydrochlorothiazide (HYZAAR) 50-12.5 MG tablet Take 1 tablet by mouth daily.  . Magnesium 250 MG TABS Take 250 mg by mouth daily.  . metoprolol tartrate (LOPRESSOR) 50 MG tablet TAKE 1 TABLET(50 MG) BY MOUTH TWICE DAILY  . potassium chloride (KLOR-CON) 10 MEQ tablet TAKE 1 TABLET(10 MEQ) BY MOUTH DAILY     Allergies:   Cymbalta [duloxetine hcl]   Social History   Socioeconomic History  . Marital status: Widowed    Spouse name: Not on file  . Number of children: 1  . Years of education: college  . Highest education level: Not on file  Occupational History  . Not on file  Tobacco Use  . Smoking status: Former Research scientist (life sciences)  . Smokeless tobacco: Never Used  . Tobacco comment: Quit 30  years ago  Substance and Sexual Activity  . Alcohol use: No    Alcohol/week: 0.0 standard drinks  . Drug use: No  . Sexual activity: Not Currently    Birth control/protection: Post-menopausal    Comment: intercourse age 13, more than 5 sexual partners  Other Topics Concern  . Not on file  Social History Narrative   Patient lives at home and her daughter and son in law live with her. Widowed.   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   Education college.   Left handed.   Caffeine     Social Determinants of Health   Financial Resource Strain:   . Difficulty of Paying Living Expenses:   Food Insecurity:   . Worried About Charity fundraiser in the Last Year:   . Arboriculturist in the Last Year:   Transportation Needs:   . Film/video editor (Medical):   [name] Lack of Transportation (Non-Medical):   Physical Activity:   . Days of Exercise per Week:   . Minutes of Exercise per Session:   Stress:   . Feeling of Stress :   Social Connections:   . Frequency of Communication with Friends and Family:   . Frequency of Social Gatherings with Friends and Family:   . Attends Religious Services:   . Active Member of Clubs or Organizations:   . Attends Archivist Meetings:   [name] Marital Status:      Family History: The patient's family history includes Breast cancer in her maternal aunt; Diabetes in her mother; Heart disease in her mother; Heart failure in her maternal aunt, maternal uncle, and mother; Hypertension in her mother and sister.  ROS:   Please see the history of present illness.     All other systems reviewed and are negative.  EKGs/Labs/Other Studies Reviewed:    The following studies were reviewed today: Echo 2009  EKG:  EKG is not ordered today.  The ekg ordered today demonstrates NSR, 68  Recent Labs: 05/21/2019: ALT 33; BUN 22; Creatinine, Ser 1.38; Potassium 4.3; Sodium 138  Recent Lipid Panel    Component Value Date/Time   CHOL 218 (H) 05/21/2019  1205   TRIG 85 05/21/2019 1205   HDL 57 05/21/2019 1205   CHOLHDL 3.8 05/21/2019 1205   LDLCALC 146 (H) 05/21/2019 1205    Physical Exam:    VS:  BP (!) 152/56 (BP Location: Left Arm, Patient Position: Sitting, Cuff Size: Normal)   Pulse 76   Temp (!) 97.1 F (36.2 C)   Ht  5\' 7"  (1.702 m)   Wt 201 lb (91.2 kg)   LMP 07/16/2002   BMI 31.48 kg/m     Wt Readings from Last 3 Encounters:  11/20/19 201 lb (91.2 kg)  10/23/19 203 lb 6.4 oz (92.3 kg)  06/04/19 197 lb 3.2 oz (89.4 kg)     GEN:  Well nourished, well developed in no acute distress HEENT: Normal NECK: No JVD; No carotid bruits CARDIAC: RRR, decreased heart sounds, no murmurs, rubs, gallops RESPIRATORY:  Clear to auscultation without rales, wheezing or rhonchi  ABDOMEN: Soft, non-tender, non-distended MUSCULOSKELETAL:  1+ bilateral pedal edema; No deformity  SKIN: Warm and dry NEUROLOGIC:  Alert and oriented x 3 PSYCHIATRIC:  Normal affect   ASSESSMENT:    Bilateral lower extremity edema Suspect venous insufficiency   Hx of CABG CABG '07 at [facility], cath '09-medical Rx, Myoview 2012 low risk  Essential hypertension Check echo  Carotid artery disease (HCC) Moderate bilateral ICA stenosis  Dyslipidemia, goal LDL below 70 Referred to lipid clinic  CRI (chronic renal insufficiency), stage 3 (moderate) GFR 41  PLAN:    Refill Lasix 40 mg daily- she ran out .  Check BMP today, check echo.  Salty six diet. Virtual f/u in 2 weeks.    Medication Adjustments/Labs and Tests Ordered: Current medicines are reviewed at length with the patient today.  Concerns regarding medicines are outlined above.  No orders of the defined types were placed in this encounter.  No orders of the defined types were placed in this encounter.   There are no Patient Instructions on file for this visit.   Angelena Form, PA-C  11/20/2019 10:19 AM    [facility]

## 2019-11-20 NOTE — Assessment & Plan Note (Signed)
Referred to lipid clinic

## 2019-11-20 NOTE — Assessment & Plan Note (Signed)
Check echo 

## 2019-11-20 NOTE — Assessment & Plan Note (Signed)
Suspect venous insufficiency

## 2019-11-20 NOTE — Assessment & Plan Note (Signed)
CABG '07 at [facility], cath '09-medical Rx, Myoview 2012 low risk

## 2019-11-20 NOTE — Assessment & Plan Note (Signed)
GFR 41 

## 2019-11-21 LAB — BASIC METABOLIC PANEL
BUN/Creatinine Ratio: 16 (ref 12–28)
BUN: 25 mg/dL (ref 8–27)
CO2: 24 mmol/L (ref 20–29)
Calcium: 9.2 mg/dL (ref 8.7–10.3)
Chloride: 103 mmol/L (ref 96–106)
Creatinine, Ser: 1.52 mg/dL — ABNORMAL HIGH (ref 0.57–1.00)
GFR calc Af Amer: 36 mL/min/{1.73_m2} — ABNORMAL LOW (ref >59)
GFR calc non Af Amer: 31 mL/min/{1.73_m2} — ABNORMAL LOW (ref >59)
Glucose: 81 mg/dL (ref 65–99)
Potassium: 4.8 mmol/L (ref 3.5–5.2)
Sodium: 141 mmol/L (ref 134–144)

## 2019-12-03 ENCOUNTER — Ambulatory Visit: Payer: Medicare Other | Admitting: Neurology

## 2019-12-09 ENCOUNTER — Other Ambulatory Visit (HOSPITAL_COMMUNITY): Payer: Medicare HMO

## 2019-12-10 ENCOUNTER — Other Ambulatory Visit: Payer: Self-pay

## 2019-12-10 ENCOUNTER — Ambulatory Visit (HOSPITAL_COMMUNITY): Payer: Medicare HMO | Attending: Cardiology

## 2019-12-10 DIAGNOSIS — R0602 Shortness of breath: Secondary | ICD-10-CM | POA: Diagnosis not present

## 2019-12-10 DIAGNOSIS — R600 Localized edema: Secondary | ICD-10-CM | POA: Insufficient documentation

## 2019-12-11 ENCOUNTER — Telehealth (INDEPENDENT_AMBULATORY_CARE_PROVIDER_SITE_OTHER): Payer: Medicare HMO | Admitting: Cardiology

## 2019-12-11 VITALS — Ht 67.0 in | Wt 195.0 lb

## 2019-12-11 DIAGNOSIS — R600 Localized edema: Secondary | ICD-10-CM

## 2019-12-11 DIAGNOSIS — I10 Essential (primary) hypertension: Secondary | ICD-10-CM

## 2019-12-11 DIAGNOSIS — I739 Peripheral vascular disease, unspecified: Secondary | ICD-10-CM | POA: Diagnosis not present

## 2019-12-11 DIAGNOSIS — Z951 Presence of aortocoronary bypass graft: Secondary | ICD-10-CM | POA: Diagnosis not present

## 2019-12-11 DIAGNOSIS — N183 Chronic kidney disease, stage 3 unspecified: Secondary | ICD-10-CM | POA: Diagnosis not present

## 2019-12-11 DIAGNOSIS — I6523 Occlusion and stenosis of bilateral carotid arteries: Secondary | ICD-10-CM | POA: Diagnosis not present

## 2019-12-11 MED ORDER — HYDRALAZINE HCL 25 MG PO TABS
25.0000 mg | ORAL_TABLET | Freq: Two times a day (BID) | ORAL | 1 refills | Status: DC
Start: 2019-12-11 — End: 2020-01-04

## 2019-12-11 MED ORDER — HYDROCHLOROTHIAZIDE 25 MG PO TABS
25.0000 mg | ORAL_TABLET | Freq: Every day | ORAL | 1 refills | Status: DC
Start: 2019-12-11 — End: 2019-12-11

## 2019-12-11 NOTE — Patient Instructions (Addendum)
Your physician has recommended you make the following change in your medication:  START HYDRALAZINE 25 [place]   Your physician recommends that you return for lab work in:  3 WEEKS BNP BMET AND TSH  June 17  Your physician recommends that you schedule a follow-up appointment in:  [place]

## 2019-12-11 NOTE — Progress Notes (Signed)
Virtual Visit via Telephone Note   This visit type was conducted due to national recommendations for restrictions regarding the COVID-19 Pandemic (e.g. social distancing) in an effort to limit this patient's exposure and mitigate transmission in our community.  Due to her co-morbid illnesses, this patient is at least at moderate risk for complications without adequate follow up.  This format is felt to be most appropriate for this patient at this time.  The patient did not have access to video technology/had technical difficulties with video requiring transitioning to audio format only (telephone).  All issues noted in this document were discussed and addressed.  No physical exam could be performed with this format.  Please refer to the patient's chart for her  consent to telehealth for [facility].   The patient was identified using 2 identifiers.  Date:  12/11/2019   ID:  Tara Potts, DOB 1933/10/15, MRN [id number]  Patient Location: Home Provider Location: Home  PCP:  [name], MD  Cardiologist:  Quay Burow, MD  Electrophysiologist:  None   Evaluation Performed:  Follow-Up Visit  Chief Complaint:  edema  History of Present Illness:    Tara Potts is a 86 y.o. female with a hx of coronary disease, status post coronary artery bypass grafting in 2007.  Catheterization in 2009 showed distal native disease treated medically.  Myoview in 2012 was low risk.  Her last echo was in 2009 and showed preserved LV function.  Other medical issues include hypertension, dyslipidemia, and vascular disease with moderate bilateral internal carotid artery narrowing.   Dr Gwenlyn Found saw her in the office 10/23/2019 with complaints of lower extremity edema and some shortness of breath.  He had her increase her furosemide from 40 mg a day to 40 mg twice daily for a week and then come back for follow-up.  I saw the patient in follow up 11/20/2019 in the office. Her feet were still swollen.  She was not  particularly short of breath.  She did tell me she was out of her furosemide and I suggested we refill that at 40 mg daily and obtained an echo and BMP.  She is contacted today for follow up.   The patient continues to have LE edema.  She is wearing her support stockings.  No orthopnea. Echo showed normal LVF, elevated LVEDP, and moderate MR.  BUN and SCr slightly elevated with the addition of Lasix.   The patient does not have symptoms concerning for COVID-19 infection (fever, chills, cough, or new shortness of breath).    Past Medical History:  Diagnosis Date  . Anemia   . Arthritis   . Asthma    as a child  . CAD (coronary artery disease)   . Carotid disease, bilateral (HCC)    mild  . Fibroid   . Hx of CABG 2007   [facility]  . Hyperlipemia   . Hypertension   . Menopausal symptoms   . Peripheral neuropathy   . Seizure-like activity [facility])    Past Surgical History:  Procedure Laterality Date  . CARDIAC CATHETERIZATION  01/09/2008   diffusely diseased graft to the PDA & diffuse PDA disease  . CAROTID DOPPLER  08/27/2011   mod. right & nild left ICA stenosis  . CATARACT SURGERY    . CORONARY ARTERY BYPASS GRAFT    . FOOT SURGERY    . JOINT REPLACEMENT     Right hip Dr. Alvan Dame 01-28-18   . MASS REMOVED FROM CERVIX  12/1997  Benign endocervical inclusion cysts  . NM MYOVIEW LTD  01/12/2011   no ischemia  . TOTAL HIP ARTHROPLASTY Right 01/28/2018   Procedure: RIGHT TOTAL HIP ARTHROPLASTY ANTERIOR APPROACH;  Surgeon: Paralee Cancel, MD;  Location: [facility];  Service: Orthopedics;  Laterality: Right;  70 mins  . TRIPLE HEART BYPASS  05/2006   [facility]  . US ECHOCARDIOGRAPHY  10/30/2007   mild MA,MR,TR,AOV mildly sclerotic,density oted in the prox asc aorta     Current Meds  Medication Sig  . Ascorbic Acid (VITAMIN C PO) Take 1 tablet by mouth 2 (two) times daily.   [name] aspirin EC 81 MG tablet Take 81 mg by mouth daily.  [name] atorvastatin (LIPITOR) 80 MG tablet Take 80 mg  by mouth daily.  . Cholecalciferol (VITAMIN D) 2000 units tablet Take 2,000 Units by mouth daily.  . furosemide (LASIX) 40 MG tablet Take 1 tablet (40 mg total) by mouth daily. (Patient taking differently: Take 60 mg by mouth daily. )  . GARLIC PO Take 1 tablet by mouth 2 (two) times daily.   . Glucosamine HCl (GLUCOSAMINE PO) Take 1 tablet by mouth daily.  [name] losartan-hydrochlorothiazide (HYZAAR) 50-12.5 MG tablet Take 1 tablet by mouth daily.  . Magnesium 250 MG TABS Take 250 mg by mouth daily.  . metoprolol tartrate (LOPRESSOR) 50 MG tablet TAKE 1 TABLET(50 MG) BY MOUTH TWICE DAILY  . potassium chloride (KLOR-CON) 10 MEQ tablet TAKE 1 TABLET(10 MEQ) BY MOUTH DAILY     Allergies:   Cymbalta [duloxetine hcl]   Social History   Tobacco Use  . Smoking status: Former Research scientist (life sciences)  . Smokeless tobacco: Never Used  . Tobacco comment: Quit 30 years ago  Substance Use Topics  . Alcohol use: No    Alcohol/week: 0.0 standard drinks  . Drug use: No     Family Hx: The patient's family history includes Breast cancer in her maternal aunt; Diabetes in her mother; Heart disease in her mother; Heart failure in her maternal aunt, maternal uncle, and mother; Hypertension in her mother and sister.  ROS:   Please see the history of present illness.    All other systems reviewed and are negative.   Prior CV studies:   The following studies were reviewed today: Echo 12/10/2019- IMPRESSIONS    1. Left ventricular ejection fraction, by estimation, is 60 to 65%. The  left ventricle has normal function. The left ventricle has no regional  wall motion abnormalities. Left ventricular diastolic parameters are  indeterminate. Elevated left ventricular  end-diastolic pressure.  2. Right ventricular systolic function is normal. The right ventricular  size is normal. There is mildly elevated pulmonary artery systolic  pressure.  3. The mitral valve is normal in structure. Moderate mitral valve   regurgitation. No evidence of mitral stenosis.  4. The aortic valve is tricuspid. Aortic valve regurgitation is not  visualized. Mild to moderate aortic valve sclerosis/calcification is  present, without any evidence of aortic stenosis.  5. The inferior vena cava is normal in size with greater than 50%  respiratory variability, suggesting right atrial pressure of 3 mmHg.   Labs/Other Tests and Data Reviewed:    EKG:  An ECG dated 10/23/2019 was personally reviewed today and demonstrated:  NSR  Recent Labs: 05/21/2019: ALT 33 11/20/2019: BUN 25; Creatinine, Ser 1.52; Potassium 4.8; Sodium 141   Recent Lipid Panel Lab Results  Component Value Date/Time   CHOL 218 (H) 05/21/2019 12:05 PM   TRIG 85 05/21/2019 12:05 PM  HDL 57 05/21/2019 12:05 PM   CHOLHDL 3.8 05/21/2019 12:05 PM   LDLCALC 146 (H) 05/21/2019 12:05 PM    Wt Readings from Last 3 Encounters:  12/11/19 195 lb (88.5 kg)  11/20/19 201 lb (91.2 kg)  10/23/19 203 lb 6.4 oz (92.3 kg)     Objective:    Vital Signs:  Ht 5\' 7"  (1.702 m)   Wt 195 lb (88.5 kg)   LMP 07/16/2002   BMI 30.54 kg/m    VITAL SIGNS:  reviewed  ASSESSMENT & PLAN:    Bilateral lower extremity edema Suspect venous insufficiency- no real change with furosemide and support stockings. I am hesitant to increase her diuretics with her renal insufficiency.   Hx of CABG CABG '07 at [facility], cath '09-medical Rx, Myoview 2012 low risk  Essential hypertension Echo showed normal LVF with elevated LVEDP and moderate MR. B/P not controlled yet.   Carotid artery disease (HCC) Moderate bilateral ICA stenosis  Dyslipidemia, goal LDL below 70 Referred to lipid clinic  CRI (chronic renal insufficiency), stage 3 (moderate) GFR 36.  Plan: Add Hydralazine 25 mg BID. Check BMP, BNP, and document TSH- f/u in 3-4 weeks.   COVID-19 Education: The signs and symptoms of COVID-19 were discussed with the patient and how to seek care for testing (follow  up with PCP or arrange E-visit).  The importance of social distancing was discussed today.  Time:   Today, I have spent 15 minutes with the patient with telehealth technology discussing the above problems.     Medication Adjustments/Labs and Tests Ordered: Current medicines are reviewed at length with the patient today.  Concerns regarding medicines are outlined above.   Tests Ordered: No orders of the defined types were placed in this encounter.   Medication Changes: No orders of the defined types were placed in this encounter.   Follow Up:  In Person with me in 3-4 weeks.   Signed, Kerin Ransom, PA-C  12/11/2019 9:25 AM    [facility]

## 2019-12-16 DIAGNOSIS — D631 Anemia in chronic kidney disease: Secondary | ICD-10-CM | POA: Diagnosis not present

## 2019-12-16 DIAGNOSIS — R609 Edema, unspecified: Secondary | ICD-10-CM | POA: Diagnosis not present

## 2019-12-16 DIAGNOSIS — I129 Hypertensive chronic kidney disease with stage 1 through stage 4 chronic kidney disease, or unspecified chronic kidney disease: Secondary | ICD-10-CM | POA: Diagnosis not present

## 2019-12-16 DIAGNOSIS — N183 Chronic kidney disease, stage 3 unspecified: Secondary | ICD-10-CM | POA: Diagnosis not present

## 2019-12-16 DIAGNOSIS — C9000 Multiple myeloma not having achieved remission: Secondary | ICD-10-CM | POA: Diagnosis not present

## 2019-12-23 DIAGNOSIS — M7061 Trochanteric bursitis, right hip: Secondary | ICD-10-CM | POA: Diagnosis not present

## 2019-12-23 DIAGNOSIS — M4316 Spondylolisthesis, lumbar region: Secondary | ICD-10-CM | POA: Diagnosis not present

## 2019-12-23 DIAGNOSIS — Z96641 Presence of right artificial hip joint: Secondary | ICD-10-CM | POA: Diagnosis not present

## 2019-12-23 DIAGNOSIS — M48062 Spinal stenosis, lumbar region with neurogenic claudication: Secondary | ICD-10-CM | POA: Diagnosis not present

## 2019-12-24 ENCOUNTER — Other Ambulatory Visit: Payer: Self-pay

## 2019-12-24 ENCOUNTER — Ambulatory Visit (INDEPENDENT_AMBULATORY_CARE_PROVIDER_SITE_OTHER): Payer: Medicare HMO | Admitting: Internal Medicine

## 2019-12-24 ENCOUNTER — Encounter: Payer: Self-pay | Admitting: Internal Medicine

## 2019-12-24 VITALS — BP 150/72 | HR 72 | Ht 67.0 in | Wt 205.0 lb

## 2019-12-24 DIAGNOSIS — Z951 Presence of aortocoronary bypass graft: Secondary | ICD-10-CM

## 2019-12-24 DIAGNOSIS — E785 Hyperlipidemia, unspecified: Secondary | ICD-10-CM

## 2019-12-24 DIAGNOSIS — I739 Peripheral vascular disease, unspecified: Secondary | ICD-10-CM

## 2019-12-24 MED ORDER — EZETIMIBE 10 MG PO TABS
10.0000 mg | ORAL_TABLET | Freq: Every day | ORAL | 3 refills | Status: DC
Start: 2019-12-24 — End: 2020-06-22

## 2019-12-24 NOTE — Progress Notes (Signed)
LIPID CLINIC CONSULT NOTE  Chief Complaint:  Manage dyslipidemia  Primary Care Physician: [name], MD  Primary Cardiologist:  Quay Burow, MD  HPI:  Tara Potts is a 86 y.o. female who is being seen today for the evaluation of dyslipidemia at the request of Lorretta Harp, MD.  This is a pleasant 86 year old female patient of Dr. Gwenlyn Found with a history of coronary artery bypass grafting in 2007 who has been followed primarily by Dr. Gwenlyn Found and recently seen by Rosalyn Gess, PA-C.  She also has a history of carotid artery disease bilaterally.  Labs from Dr. Shelia Media recently showed total cholesterol 235, HDL of 52, triglycerides 72 and LDL 159.  She reports compliance with 80 mg atorvastatin daily but remains well above target LDL less than 70.  She reports a fairly balanced diet.  She says she does have some occasional sweets but is very surprised that her cholesterol is still high.  She also says she was never told that she was uncontrolled with regards to her cholesterol targets either by her PCP or Dr. Gwenlyn Found.  PMHx:  Past Medical History:  Diagnosis Date  . Anemia   . Arthritis   . Asthma    as a child  . CAD (coronary artery disease)   . Carotid disease, bilateral (HCC)    mild  . Fibroid   . Hx of CABG 2007   [facility]  . Hyperlipemia   . Hypertension   . Menopausal symptoms   . Peripheral neuropathy   . Seizure-like activity [facility])     Past Surgical History:  Procedure Laterality Date  . CARDIAC CATHETERIZATION  01/09/2008   diffusely diseased graft to the PDA & diffuse PDA disease  . CAROTID DOPPLER  08/27/2011   mod. right & nild left ICA stenosis  . CATARACT SURGERY    . CORONARY ARTERY BYPASS GRAFT    . FOOT SURGERY    . JOINT REPLACEMENT     Right hip Dr. Alvan Dame 01-28-18   . MASS REMOVED FROM CERVIX  12/1997   Benign endocervical inclusion cysts  . NM MYOVIEW LTD  01/12/2011   no ischemia  . TOTAL HIP ARTHROPLASTY Right 01/28/2018   Procedure: RIGHT  TOTAL HIP ARTHROPLASTY ANTERIOR APPROACH;  Surgeon: Paralee Cancel, MD;  Location: [facility];  Service: Orthopedics;  Laterality: Right;  70 mins  . TRIPLE HEART BYPASS  05/2006   [facility]  . US ECHOCARDIOGRAPHY  10/30/2007   mild MA,MR,TR,AOV mildly sclerotic,density oted in the prox asc aorta    FAMHx:  Family History  Problem Relation Age of Onset  . Diabetes Mother   . Hypertension Mother   . Heart disease Mother   . Heart failure Mother   . Hypertension Sister   . Breast cancer Maternal Aunt        Age 30  . Heart failure Maternal Aunt   . Heart failure Maternal Uncle     SOCHx:   reports that she has quit smoking. She has never used smokeless tobacco. She reports that she does not drink alcohol and does not use drugs.  ALLERGIES:  Allergies  Allergen Reactions  . Cymbalta [Duloxetine Hcl] Nausea Only    ROS: Pertinent items noted in HPI and remainder of comprehensive ROS otherwise negative.  HOME MEDS: Current Outpatient Medications on File Prior to Visit  Medication Sig Dispense Refill  . Ascorbic Acid (VITAMIN C PO) Take 1 tablet by mouth 2 (two) times daily.     [name]  aspirin EC 81 MG tablet Take 81 mg by mouth daily.    [name] atorvastatin (LIPITOR) 80 MG tablet Take 80 mg by mouth daily.    . Calcium-Magnesium-Vitamin D (CALCIUM MAGNESIUM PO) Take 1 tablet by mouth daily.     . Cholecalciferol (VITAMIN D) 2000 units tablet Take 2,000 Units by mouth daily.    . Coenzyme Q10 (COQ10 PO) Take 1 capsule by mouth daily.    . furosemide (LASIX) 40 MG tablet Take 1 tablet (40 mg total) by mouth daily. (Patient taking differently: Take 60 mg by mouth daily. ) 90 tablet 3  . GARLIC PO Take 1 tablet by mouth 2 (two) times daily.     . Glucosamine HCl (GLUCOSAMINE PO) Take 1 tablet by mouth daily.    . hydrALAZINE (APRESOLINE) 25 MG tablet Take 1 tablet (25 mg total) by mouth in the morning and at bedtime. 60 tablet 1  . losartan-hydrochlorothiazide (HYZAAR) 50-12.5 MG tablet  Take 1 tablet by mouth daily.  2  . Magnesium 250 MG TABS Take 250 mg by mouth daily.    . metoprolol tartrate (LOPRESSOR) 50 MG tablet TAKE 1 TABLET(50 MG) BY MOUTH TWICE DAILY 180 tablet 3  . potassium chloride (KLOR-CON) 10 MEQ tablet TAKE 1 TABLET(10 MEQ) BY MOUTH DAILY 90 tablet 3   No current facility-administered medications on file prior to visit.    LABS/IMAGING: No results found for this or any previous visit (from the past 48 hour(s)). No results found.  LIPID PANEL:    Component Value Date/Time   CHOL 218 (H) 05/21/2019 1205   TRIG 85 05/21/2019 1205   HDL 57 05/21/2019 1205   CHOLHDL 3.8 05/21/2019 1205   LDLCALC 146 (H) 05/21/2019 1205    WEIGHTS: Wt Readings from Last 3 Encounters:  12/24/19 205 lb (93 kg)  12/11/19 195 lb (88.5 kg)  11/20/19 201 lb (91.2 kg)    VITALS: BP (!) 150/72   Pulse 72   Ht 5\' 7"  (1.702 m)   Wt 205 lb (93 kg)   LMP 07/16/2002   SpO2 98%   BMI 32.11 kg/m   EXAM: Deferred  EKG: Deferred  ASSESSMENT: 1. Mixed dyslipidemia, goal LDL less than 70 2. Coronary artery disease status post CABG 2007 3. Bilateral carotid artery disease  PLAN: 1.   Ms. Shedden has a mixed dyslipidemia with target LDL less than 70 however current LDL is 159.  She says she eats a "healthy "balanced diet.  We did provide her with additional dietary information today.  She also says she is compliant with her atorvastatin 80 mg daily.  Based on this she will need additional medication with actually close to 60% reduction in LDL cholesterol.  The ideal medication for her is a PCSK9 inhibitor however she says she does not want to do an injection.  We will therefore add back ezetimibe 10 mg daily.  She said she had taken this in the past but from some reason was not on it.  Repeat lipids in about 6 months after dietary changes and follow-up with me at that time.  Thanks for the kind referral.  Pixie Casino, MD, FACC, [place] Director of the Advanced Lipid Disorders &  Cardiovascular Risk Reduction Clinic Diplomate of the American Board of Clinical Lipidology Attending Cardiologist  Direct Dial: [phone]  Fax: [phone]  Website:  [email or web address] Osgood Reyann Troop 12/24/2019, 8:46 AM

## 2019-12-24 NOTE — Patient Instructions (Signed)
Medication Instructions:  START zetia 10mg  daily Continue all other current medications  *If you need a refill on your cardiac medications before your next appointment, please call your pharmacy*   Lab Work: FASTING lab work in 6 months to check cholesterol  If you have labs (blood work) drawn today and your tests are completely normal, you will receive your results only by: [name] MyChart Message (if you have MyChart) OR . A paper copy in the mail If you have any lab test that is abnormal or we need to change your treatment, we will call you to review the results.   Testing/Procedures: NONE   Follow-Up: At [facility], you and your health needs are our priority.  As part of our continuing mission to provide you with exceptional heart care, we have created designated Provider Care Teams.  These Care Teams include your primary Cardiologist (physician) and Advanced Practice Providers (APPs -  Physician Assistants and Nurse Practitioners) who all work together to provide you with the care you need, when you need it.  We recommend signing up for the patient portal called "MyChart".  Sign up information is provided on this After Visit Summary.  MyChart is used to connect with patients for Virtual Visits (Telemedicine).  Patients are able to view lab/test results, encounter notes, upcoming appointments, etc.  Non-urgent messages can be sent to your provider as well.   To learn more about what you can do with MyChart, go to [email or web address].    Your next appointment:   6 month(s) - lipid clinic  The format for your next appointment:   Either In Person or Virtual  Provider:   K. Mali Hilty, MD   Other Instructions

## 2020-01-02 ENCOUNTER — Other Ambulatory Visit: Payer: Self-pay | Admitting: Cardiology

## 2020-01-11 ENCOUNTER — Ambulatory Visit: Payer: Medicare HMO | Admitting: Cardiology

## 2020-01-20 DIAGNOSIS — M545 Low back pain: Secondary | ICD-10-CM | POA: Diagnosis not present

## 2020-01-20 DIAGNOSIS — Z96641 Presence of right artificial hip joint: Secondary | ICD-10-CM | POA: Diagnosis not present

## 2020-01-20 DIAGNOSIS — Z471 Aftercare following joint replacement surgery: Secondary | ICD-10-CM | POA: Diagnosis not present

## 2020-01-20 DIAGNOSIS — M48062 Spinal stenosis, lumbar region with neurogenic claudication: Secondary | ICD-10-CM | POA: Diagnosis not present

## 2020-01-26 ENCOUNTER — Ambulatory Visit: Payer: Medicare HMO | Admitting: Internal Medicine

## 2020-04-07 ENCOUNTER — Telehealth: Payer: Self-pay | Admitting: *Deleted

## 2020-04-07 NOTE — Telephone Encounter (Signed)
A message was left, re: her follow up visit. 

## 2020-04-14 ENCOUNTER — Other Ambulatory Visit: Payer: Self-pay

## 2020-04-14 ENCOUNTER — Encounter: Payer: Self-pay | Admitting: Neurology

## 2020-04-14 ENCOUNTER — Ambulatory Visit (INDEPENDENT_AMBULATORY_CARE_PROVIDER_SITE_OTHER): Payer: Medicare HMO | Admitting: Neurology

## 2020-04-14 VITALS — BP 154/60 | HR 71 | Ht 67.0 in | Wt 205.5 lb

## 2020-04-14 DIAGNOSIS — G629 Polyneuropathy, unspecified: Secondary | ICD-10-CM | POA: Insufficient documentation

## 2020-04-14 DIAGNOSIS — G6289 Other specified polyneuropathies: Secondary | ICD-10-CM

## 2020-04-14 DIAGNOSIS — R269 Unspecified abnormalities of gait and mobility: Secondary | ICD-10-CM | POA: Diagnosis not present
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
## 2020-04-26 ENCOUNTER — Other Ambulatory Visit (HOSPITAL_COMMUNITY): Payer: Self-pay | Admitting: Cardiovascular Disease

## 2020-04-26 ENCOUNTER — Ambulatory Visit (HOSPITAL_COMMUNITY)
Admission: RE | Admit: 2020-04-26 | Discharge: 2020-04-26 | Disposition: A | Discharge status: Home / Self Care | Payer: Medicare HMO | Source: Ambulatory Visit | Attending: Cardiovascular Disease | Admitting: Cardiovascular Disease

## 2020-04-26 ENCOUNTER — Other Ambulatory Visit: Payer: Self-pay

## 2020-04-26 DIAGNOSIS — I6523 Occlusion and stenosis of bilateral carotid arteries: Secondary | ICD-10-CM

## 2020-04-29 ENCOUNTER — Other Ambulatory Visit: Payer: Self-pay

## 2020-04-29 DIAGNOSIS — I6523 Occlusion and stenosis of bilateral carotid arteries: Secondary | ICD-10-CM

## 2020-05-20 DIAGNOSIS — R600 Localized edema: Secondary | ICD-10-CM | POA: Diagnosis not present

## 2020-05-20 DIAGNOSIS — R7309 Other abnormal glucose: Secondary | ICD-10-CM | POA: Diagnosis not present

## 2020-05-20 DIAGNOSIS — N184 Chronic kidney disease, stage 4 (severe): Secondary | ICD-10-CM | POA: Diagnosis not present

## 2020-05-20 DIAGNOSIS — E8809 Other disorders of plasma-protein metabolism, not elsewhere classified: Secondary | ICD-10-CM | POA: Diagnosis not present

## 2020-05-20 DIAGNOSIS — D649 Anemia, unspecified: Secondary | ICD-10-CM | POA: Diagnosis not present

## 2020-05-24 ENCOUNTER — Ambulatory Visit (INDEPENDENT_AMBULATORY_CARE_PROVIDER_SITE_OTHER): Payer: Medicare HMO | Admitting: Cardiovascular Disease

## 2020-05-24 ENCOUNTER — Encounter: Payer: Self-pay | Admitting: Cardiovascular Disease

## 2020-05-24 VITALS — BP 144/72 | HR 70 | Ht 67.0 in | Wt 203.0 lb

## 2020-05-24 DIAGNOSIS — I739 Peripheral vascular disease, unspecified: Secondary | ICD-10-CM | POA: Diagnosis not present

## 2020-05-24 DIAGNOSIS — I10 Essential (primary) hypertension: Secondary | ICD-10-CM

## 2020-05-24 DIAGNOSIS — N183 Chronic kidney disease, stage 3 unspecified: Secondary | ICD-10-CM | POA: Diagnosis not present

## 2020-05-24 DIAGNOSIS — Z951 Presence of aortocoronary bypass graft: Secondary | ICD-10-CM

## 2020-05-24 DIAGNOSIS — E785 Hyperlipidemia, unspecified: Secondary | ICD-10-CM | POA: Diagnosis not present

## 2020-05-24 MED ORDER — FUROSEMIDE 40 MG PO TABS: 40.0000 mg | ORAL_TABLET | Freq: Two times a day (BID) | ORAL | 3 refills | Status: DC

## 2020-05-24 NOTE — Assessment & Plan Note (Signed)
History bilateral lower extremity edema on furosemide 60 mg a day.  She did have a 2D echo performed 12/10/2019 that showed normal LV systolic function.  She does not admit to excessive salt intake.  I am going to increase her furosemide to 40 mg p.o. twice daily, will check a basic metabolic panel in 7 to 10 days and will help we will have her follow-up with Kerin Ransom in 4 to 6 weeks.

## 2020-05-24 NOTE — Assessment & Plan Note (Signed)
History of peripheral arterial disease with bilateral internal carotid artery stenosis right greater than left by recent duplex ultrasound 04/26/2020.  We will repeat this in 1 year.

## 2020-05-24 NOTE — Assessment & Plan Note (Signed)
History of CAD status post coronary artery bypass grafting November 2007 at [facility].  I did a heart cath on her 01/09/2008 revealing diffusely diseased graft to the PDA as well as a diffusely diseased PDA which I elected to treat medically.  She denies chest pain.

## 2020-05-24 NOTE — Patient Instructions (Signed)
Medication Instructions:  Increase furosemide (lasix) 40mg  twice daily.  *If you need a refill on your cardiac medications before your next appointment, please call your pharmacy*   Lab Work: Your physician recommends that you return for lab work in: 7-10 days for [employer].   If you have labs (blood work) drawn today and your tests are completely normal, you will receive your results only by: [name] MyChart Message (if you have MyChart) OR . A paper copy in the mail If you have any lab test that is abnormal or we need to change your treatment, we will call you to review the results.  Follow-Up: At [facility], you and your health needs are our priority.  As part of our continuing mission to provide you with exceptional heart care, we have created designated Provider Care Teams.  These Care Teams include your primary Cardiologist (physician) and Advanced Practice Providers (APPs -  Physician Assistants and Nurse Practitioners) who all work together to provide you with the care you need, when you need it.  We recommend signing up for the patient portal called "MyChart".  Sign up information is provided on this After Visit Summary.  MyChart is used to connect with patients for Virtual Visits (Telemedicine).  Patients are able to view lab/test results, encounter notes, upcoming appointments, etc.  Non-urgent messages can be sent to your provider as well.   To learn more about what you can do with MyChart, go to [email or web address].    Your next appointment:   4 week(s)  The format for your next appointment:   In Person  Provider:   You will see one of the following Advanced Practice Providers on your designated Care Team:    Kerin Ransom, [place]  Then, Quay Burow, MD will plan to see you again in 6 month(s).    Other Instructions Also, we need to schedule you a follow up with Dr. Debara Pickett.

## 2020-05-24 NOTE — Assessment & Plan Note (Signed)
History of dyslipidemia followed by Dr. Debara Pickett in lipid clinic.  Her most recent lipid profile performed 10/19/2019 revealed an LDL of 159 on atorvastatin and Zetia.  She may benefit from being on Repatha.  I am referring her back to Dr. Debara Pickett for further evaluation

## 2020-05-24 NOTE — Assessment & Plan Note (Signed)
History of essential hypertension a blood pressure measured today 144/72.  She is on hydralazine, losartan, hydrochlorothiazide and metoprolol

## 2020-05-24 NOTE — Progress Notes (Signed)
05/24/2020 Tara Potts   [birth year]  [id number]  Primary Physician [name], MD Primary Cardiologist: Lorretta Harp MD Lupe Carney, [place]  HPI:  Tara Potts is a 86 y.o.  mildly overweight, widowed [place] American female, mother of 2, grandmother to 1 grandchild who I saw in the office4/9/21. Her husband [name] was also a patient of mine and is deceased. She has a history of CAD status post coronary artery bypass grafting November 2007 at [facility]. Her other problems include hypertension, hyperlipidemia and mild carotid disease. She had a Myoview stress test performed January 12, 2011, which was nonischemic. Carotid Dopplers performed August 27, 2011, showed moderate right and mild left ICA stenosis scheduled to be redone in February of next year. I last cath'd her January 09, 2008, revealing diffusely diseased graft to the PDA as well as diffuse PDA disease and I elected to treat her medically at that time. Her major complaints are nocturnal leg cramps. Since I saw her a year ago she has remained asymptomatic denying chest pain or shortness of breath.  She had a right total hip replacement by Dr.Olinin July2019which she has recuperated from nicely.She is currently walking with a cane because of left hip pain.   She still walking with a cane and is never fully recovered from her right total hip replacement.  She complains of chronic bilateral leg pain but does have intact pedal pulses.    Since I saw her 6 months ago she continues to do well.  She walks with a cane.  Her major complaint is of swelling in her legs.  She did have a 2D echo performed 12/10/2019 that revealed normal LV systolic function.  She is of 60 mg of furosemide a day.  Her serum creatinine runs in the mid 1 range.   Current Meds  Medication Sig  . Ascorbic Acid (VITAMIN C PO) Take 1 tablet by mouth 2 (two) times daily.   [name] aspirin EC 81 MG tablet Take 81 mg by mouth daily.  [name] atorvastatin  (LIPITOR) 80 MG tablet Take 80 mg by mouth daily.  . Calcium-Magnesium-Vitamin D (CALCIUM MAGNESIUM PO) Take 1 tablet by mouth daily.   . Cholecalciferol (VITAMIN D) 2000 units tablet Take 2,000 Units by mouth daily.  . Coenzyme Q10 (COQ10 PO) Take 1 capsule by mouth daily.  . furosemide (LASIX) 40 MG tablet Take 1 tablet (40 mg total) by mouth daily. (Patient taking differently: Take 60 mg by mouth daily. )  . GARLIC PO Take 1 tablet by mouth 2 (two) times daily.   . Glucosamine HCl (GLUCOSAMINE PO) Take 1 tablet by mouth daily.  . hydrALAZINE (APRESOLINE) 25 MG tablet Take 1 tablet (25 mg total) by mouth in the morning and at bedtime.  [name] losartan-hydrochlorothiazide (HYZAAR) 50-12.5 MG tablet Take 1 tablet by mouth daily.  . Magnesium 250 MG TABS Take 250 mg by mouth daily.  . metoprolol tartrate (LOPRESSOR) 50 MG tablet TAKE 1 TABLET(50 MG) BY MOUTH TWICE DAILY  . potassium chloride (KLOR-CON) 10 MEQ tablet TAKE 1 TABLET(10 MEQ) BY MOUTH DAILY     Allergies  Allergen Reactions  . Cymbalta [Duloxetine Hcl] Nausea Only    Social History   Socioeconomic History  . Marital status: Widowed    Spouse name: Not on file  . Number of children: 1  . Years of education: college  . Highest education level: Not on file  Occupational History  . Not on file  Tobacco Use  . Smoking status: Former Research scientist (life sciences)  . Smokeless tobacco: Never Used  . Tobacco comment: Quit 30 years ago  Vaping Use  . Vaping Use: Never used  Substance and Sexual Activity  . Alcohol use: No    Alcohol/week: 0.0 standard drinks  . Drug use: No  . Sexual activity: Not Currently    Birth control/protection: Post-menopausal    Comment: intercourse age 45, more than 5 sexual partners  Other Topics Concern  . Not on file  Social History Narrative   Patient lives at home and her daughter and son in law live with her. Widowed.   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   Education college.   Left handed.   Caffeine      Social Determinants of Health   Financial Resource Strain:   . Difficulty of Paying Living Expenses: Not on file  Food Insecurity:   . Worried About Charity fundraiser in the Last Year: Not on file  . Ran Out of Food in the Last Year: Not on file  Transportation Needs:   . Lack of Transportation (Medical): Not on file  . Lack of Transportation (Non-Medical): Not on file  Physical Activity:   . Days of Exercise per Week: Not on file  . Minutes of Exercise per Session: Not on file  Stress:   . Feeling of Stress : Not on file  Social Connections:   . Frequency of Communication with Friends and Family: Not on file  . Frequency of Social Gatherings with Friends and Family: Not on file  . Attends Religious Services: Not on file  . Active Member of Clubs or Organizations: Not on file  . Attends Archivist Meetings: Not on file  . Marital Status: Not on file  Intimate Partner Violence:   . Fear of Current or Ex-Partner: Not on file  . Emotionally Abused: Not on file  . Physically Abused: Not on file  . Sexually Abused: Not on file     Review of Systems: General: negative for chills, fever, night sweats or weight changes.  Cardiovascular: negative for chest pain, dyspnea on exertion, edema, orthopnea, palpitations, paroxysmal nocturnal dyspnea or shortness of breath Dermatological: negative for rash Respiratory: negative for cough or wheezing Urologic: negative for hematuria Abdominal: negative for nausea, vomiting, diarrhea, bright red blood per rectum, melena, or hematemesis Neurologic: negative for visual changes, syncope, or dizziness All other systems reviewed and are otherwise negative except as noted above.    Blood pressure (!) 144/72, pulse 70, height 5\' 7"  (1.702 m), weight 203 lb (92.1 kg), last menstrual period 07/16/2002.  General appearance: alert Neck: no adenopathy, no JVD, supple, symmetrical, trachea midline, thyroid not enlarged, symmetric, no  tenderness/mass/nodules and Bilateral carotid bruits right louder than left Lungs: clear to auscultation bilaterally Heart: regular rate and rhythm, S1, S2 normal, no murmur, click, rub or gallop Extremities: 1-2+ pitting edema bilaterally Pulses: 2+ and symmetric Skin: Skin color, texture, turgor normal. No rashes or lesions Neurologic: Alert and oriented X 3, normal strength and tone. Normal symmetric reflexes. Normal coordination and gait  EKG sinus rhythm at 70 with nonspecific ST and T wave changes.  I personally reviewed this EKG.  ASSESSMENT AND PLAN:   Dyslipidemia, goal LDL below 70 History of dyslipidemia followed by Dr. Debara Pickett in lipid clinic.  Her most recent lipid profile performed 10/19/2019 revealed an LDL of 159 on atorvastatin and Zetia.  She may benefit from being on Repatha.  I am  referring her back to Dr. Debara Pickett for further evaluation  Hx of CABG History of CAD status post coronary artery bypass grafting November 2007 at [facility].  I did a heart cath on her 01/09/2008 revealing diffusely diseased graft to the PDA as well as a diffusely diseased PDA which I elected to treat medically.  She denies chest pain.  Peripheral vascular disease:  Moderate right and mild left ICA stenosis. History of peripheral arterial disease with bilateral internal carotid artery stenosis right greater than left by recent duplex ultrasound 04/26/2020.  We will repeat this in 1 year.  Essential hypertension History of essential hypertension a blood pressure measured today 144/72.  She is on hydralazine, losartan, hydrochlorothiazide and metoprolol  Bilateral lower extremity edema History bilateral lower extremity edema on furosemide 60 mg a day.  She did have a 2D echo performed 12/10/2019 that showed normal LV systolic function.  She does not admit to excessive salt intake.  I am going to increase her furosemide to 40 mg p.o. twice daily, will check a basic metabolic panel in 7 to 10 days and  will help we will have her follow-up with Kerin Ransom in 4 to 6 weeks.      Lorretta Harp MD FACP,FACC,FAHA, [facility] 05/24/2020 1:42 PM

## 2020-06-02 ENCOUNTER — Telehealth: Payer: Self-pay | Admitting: Internal Medicine

## 2020-06-02 NOTE — Telephone Encounter (Signed)
Scheduled appt per 11/18 sch msg - no answer. Mailed reminder letter with appt date and time and left vmail for patient .

## 2020-06-10 ENCOUNTER — Other Ambulatory Visit: Payer: Self-pay | Admitting: Cardiovascular Disease

## 2020-06-15 ENCOUNTER — Other Ambulatory Visit: Payer: Self-pay | Admitting: Physician Assistant

## 2020-06-15 DIAGNOSIS — C9000 Multiple myeloma not having achieved remission: Secondary | ICD-10-CM

## 2020-06-16 ENCOUNTER — Inpatient Hospital Stay: Payer: Medicare HMO | Attending: Internal Medicine

## 2020-06-16 DIAGNOSIS — Z951 Presence of aortocoronary bypass graft: Secondary | ICD-10-CM | POA: Insufficient documentation

## 2020-06-16 DIAGNOSIS — N189 Chronic kidney disease, unspecified: Secondary | ICD-10-CM | POA: Insufficient documentation

## 2020-06-16 DIAGNOSIS — C9000 Multiple myeloma not having achieved remission: Secondary | ICD-10-CM | POA: Diagnosis not present

## 2020-06-16 DIAGNOSIS — I129 Hypertensive chronic kidney disease with stage 1 through stage 4 chronic kidney disease, or unspecified chronic kidney disease: Secondary | ICD-10-CM | POA: Diagnosis not present

## 2020-06-16 DIAGNOSIS — Z96641 Presence of right artificial hip joint: Secondary | ICD-10-CM | POA: Insufficient documentation

## 2020-06-16 DIAGNOSIS — R609 Edema, unspecified: Secondary | ICD-10-CM | POA: Diagnosis not present

## 2020-06-16 DIAGNOSIS — D631 Anemia in chronic kidney disease: Secondary | ICD-10-CM | POA: Diagnosis not present

## 2020-06-16 DIAGNOSIS — D649 Anemia, unspecified: Secondary | ICD-10-CM | POA: Insufficient documentation

## 2020-06-16 DIAGNOSIS — N183 Chronic kidney disease, stage 3 unspecified: Secondary | ICD-10-CM | POA: Diagnosis not present

## 2020-06-21 ENCOUNTER — Observation Stay (HOSPITAL_COMMUNITY)
Admission: EM | Admit: 2020-06-21 | Discharge: 2020-06-24 | Disposition: A | Discharge status: Home / Self Care | Payer: Medicare HMO | Attending: Internal Medicine | Admitting: Internal Medicine

## 2020-06-21 ENCOUNTER — Encounter (HOSPITAL_COMMUNITY): Payer: Self-pay

## 2020-06-21 ENCOUNTER — Other Ambulatory Visit: Payer: Self-pay

## 2020-06-21 ENCOUNTER — Emergency Department (HOSPITAL_COMMUNITY): Payer: Medicare HMO

## 2020-06-21 DIAGNOSIS — N390 Urinary tract infection, site not specified: Secondary | ICD-10-CM | POA: Diagnosis not present

## 2020-06-21 DIAGNOSIS — Z20822 Contact with and (suspected) exposure to covid-19: Secondary | ICD-10-CM | POA: Diagnosis not present

## 2020-06-21 DIAGNOSIS — R5383 Other fatigue: Secondary | ICD-10-CM

## 2020-06-21 DIAGNOSIS — I6523 Occlusion and stenosis of bilateral carotid arteries: Secondary | ICD-10-CM | POA: Diagnosis not present

## 2020-06-21 DIAGNOSIS — Z951 Presence of aortocoronary bypass graft: Secondary | ICD-10-CM

## 2020-06-21 DIAGNOSIS — R7989 Other specified abnormal findings of blood chemistry: Secondary | ICD-10-CM

## 2020-06-21 DIAGNOSIS — N183 Chronic kidney disease, stage 3 unspecified: Secondary | ICD-10-CM | POA: Insufficient documentation

## 2020-06-21 DIAGNOSIS — R079 Chest pain, unspecified: Secondary | ICD-10-CM | POA: Diagnosis not present

## 2020-06-21 DIAGNOSIS — N179 Acute kidney failure, unspecified: Principal | ICD-10-CM | POA: Insufficient documentation

## 2020-06-21 DIAGNOSIS — Z87891 Personal history of nicotine dependence: Secondary | ICD-10-CM | POA: Insufficient documentation

## 2020-06-21 DIAGNOSIS — N1831 Chronic kidney disease, stage 3a: Secondary | ICD-10-CM | POA: Diagnosis not present

## 2020-06-21 DIAGNOSIS — Z7982 Long term (current) use of aspirin: Secondary | ICD-10-CM | POA: Diagnosis not present

## 2020-06-21 DIAGNOSIS — J45909 Unspecified asthma, uncomplicated: Secondary | ICD-10-CM | POA: Diagnosis not present

## 2020-06-21 DIAGNOSIS — Z96643 Presence of artificial hip joint, bilateral: Secondary | ICD-10-CM | POA: Insufficient documentation

## 2020-06-21 DIAGNOSIS — E785 Hyperlipidemia, unspecified: Secondary | ICD-10-CM | POA: Diagnosis present

## 2020-06-21 DIAGNOSIS — C9000 Multiple myeloma not having achieved remission: Secondary | ICD-10-CM | POA: Diagnosis not present

## 2020-06-21 DIAGNOSIS — Z79899 Other long term (current) drug therapy: Secondary | ICD-10-CM | POA: Insufficient documentation

## 2020-06-21 DIAGNOSIS — R0602 Shortness of breath: Secondary | ICD-10-CM | POA: Diagnosis not present

## 2020-06-21 DIAGNOSIS — E860 Dehydration: Secondary | ICD-10-CM | POA: Diagnosis not present

## 2020-06-21 DIAGNOSIS — I779 Disorder of arteries and arterioles, unspecified: Secondary | ICD-10-CM | POA: Diagnosis present

## 2020-06-21 DIAGNOSIS — E871 Hypo-osmolality and hyponatremia: Secondary | ICD-10-CM | POA: Diagnosis not present

## 2020-06-21 DIAGNOSIS — R634 Abnormal weight loss: Secondary | ICD-10-CM | POA: Diagnosis not present

## 2020-06-21 DIAGNOSIS — I10 Essential (primary) hypertension: Secondary | ICD-10-CM | POA: Diagnosis not present

## 2020-06-21 DIAGNOSIS — I129 Hypertensive chronic kidney disease with stage 1 through stage 4 chronic kidney disease, or unspecified chronic kidney disease: Secondary | ICD-10-CM | POA: Insufficient documentation

## 2020-06-21 DIAGNOSIS — E875 Hyperkalemia: Secondary | ICD-10-CM | POA: Diagnosis not present

## 2020-06-21 DIAGNOSIS — N1832 Chronic kidney disease, stage 3b: Secondary | ICD-10-CM | POA: Diagnosis present

## 2020-06-21 DIAGNOSIS — I2510 Atherosclerotic heart disease of native coronary artery without angina pectoris: Secondary | ICD-10-CM | POA: Diagnosis not present

## 2020-06-21 DIAGNOSIS — E7800 Pure hypercholesterolemia, unspecified: Secondary | ICD-10-CM | POA: Diagnosis not present

## 2020-06-21 LAB — CBC WITH DIFFERENTIAL/PLATELET
Abs Immature Granulocytes: 0.05 10*3/uL (ref 0.00–0.07)
Basophils Absolute: 0 10*3/uL (ref 0.0–0.1)
Basophils Relative: 0 %
Eosinophils Absolute: 0.2 10*3/uL (ref 0.0–0.5)
Eosinophils Relative: 3 %
HCT: 30.4 % — ABNORMAL LOW (ref 36.0–46.0)
Hemoglobin: 9.9 g/dL — ABNORMAL LOW (ref 12.0–15.0)
Immature Granulocytes: 1 %
Lymphocytes Relative: 32 %
Lymphs Abs: 2.2 10*3/uL (ref 0.7–4.0)
MCH: 26.8 pg (ref 26.0–34.0)
MCHC: 32.6 g/dL (ref 30.0–36.0)
MCV: 82.4 fL (ref 80.0–100.0)
Monocytes Absolute: 0.9 10*3/uL (ref 0.1–1.0)
Monocytes Relative: 12 %
Neutro Abs: 3.6 10*3/uL (ref 1.7–7.7)
Neutrophils Relative %: 52 %
Platelets: 159 10*3/uL (ref 150–400)
RBC: 3.69 MIL/uL — ABNORMAL LOW (ref 3.87–5.11)
RDW: 17.4 % — ABNORMAL HIGH (ref 11.5–15.5)
WBC: 7.1 10*3/uL (ref 4.0–10.5)
nRBC: 0 % (ref 0.0–0.2)

## 2020-06-21 LAB — URINALYSIS, ROUTINE W REFLEX MICROSCOPIC
Bilirubin Urine: NEGATIVE
Glucose, UA: NEGATIVE mg/dL
Hgb urine dipstick: NEGATIVE
Ketones, ur: NEGATIVE mg/dL
Nitrite: NEGATIVE
Protein, ur: NEGATIVE mg/dL
Specific Gravity, Urine: 1.014 (ref 1.005–1.030)
pH: 5 (ref 5.0–8.0)

## 2020-06-21 LAB — RESP PANEL BY RT-PCR (FLU A&B, COVID) ARPGX2
Influenza A by PCR: NEGATIVE
Influenza B by PCR: NEGATIVE
SARS Coronavirus 2 by RT PCR: NEGATIVE

## 2020-06-21 LAB — COMPREHENSIVE METABOLIC PANEL
ALT: 32 U/L (ref 0–44)
AST: 35 U/L (ref 15–41)
Albumin: 3.9 g/dL (ref 3.5–5.0)
Alkaline Phosphatase: 69 U/L (ref 38–126)
Anion gap: 12 (ref 5–15)
BUN: 80 mg/dL — ABNORMAL HIGH (ref 8–23)
CO2: 19 mmol/L — ABNORMAL LOW (ref 22–32)
Calcium: 9.1 mg/dL (ref 8.9–10.3)
Chloride: 101 mmol/L (ref 98–111)
Creatinine, Ser: 2.82 mg/dL — ABNORMAL HIGH (ref 0.44–1.00)
GFR, Estimated: 16 mL/min — ABNORMAL LOW (ref >60)
Glucose, Bld: 127 mg/dL — ABNORMAL HIGH (ref 70–99)
Potassium: 5.1 mmol/L (ref 3.5–5.1)
Sodium: 132 mmol/L — ABNORMAL LOW (ref 135–145)
Total Bilirubin: 0.9 mg/dL (ref 0.3–1.2)
Total Protein: 9.1 g/dL — ABNORMAL HIGH (ref 6.5–8.1)

## 2020-06-21 LAB — BRAIN NATRIURETIC PEPTIDE: B Natriuretic Peptide: 109.3 pg/mL — ABNORMAL HIGH (ref 0.0–100.0)

## 2020-06-21 LAB — TROPONIN I (HIGH SENSITIVITY)
Troponin I (High Sensitivity): 20 ng/L — ABNORMAL HIGH (ref <18)
Troponin I (High Sensitivity): 17 ng/L (ref <18)

## 2020-06-21 MED ORDER — SODIUM CHLORIDE 0.9 % IV SOLN
1.0000 g | INTRAVENOUS | Status: DC
  Administered 2020-06-21 – 2020-06-23 (×3): 1 g via INTRAVENOUS
  Filled 2020-06-21 (×2): qty 10
  Filled 2020-06-21: qty 1

## 2020-06-21 MED ORDER — SODIUM CHLORIDE 0.9 % IV BOLUS
500.0000 mL | Freq: Once | INTRAVENOUS | Status: AC
  Administered 2020-06-21: 500 mL via INTRAVENOUS

## 2020-06-21 MED ORDER — HYDROXYZINE HCL 25 MG PO TABS
25.0000 mg | ORAL_TABLET | Freq: Three times a day (TID) | ORAL | Status: DC | PRN
  Administered 2020-06-22: 25 mg via ORAL
  Filled 2020-06-21: qty 1

## 2020-06-21 MED ORDER — CALCIUM CARBONATE ANTACID 1250 MG/5ML PO SUSP
500.0000 mg | Freq: Four times a day (QID) | ORAL | Status: DC | PRN
  Filled 2020-06-21: qty 5

## 2020-06-21 MED ORDER — DOCUSATE SODIUM 283 MG RE ENEM
1.0000 | ENEMA | RECTAL | Status: DC | PRN
  Filled 2020-06-21: qty 1

## 2020-06-21 MED ORDER — ONDANSETRON HCL 4 MG/2ML IJ SOLN: 4.0000 mg | Freq: Four times a day (QID) | INTRAMUSCULAR | Status: DC | PRN

## 2020-06-21 MED ORDER — HEPARIN SODIUM (PORCINE) 5000 UNIT/ML IJ SOLN
5000.0000 [IU] | Freq: Three times a day (TID) | INTRAMUSCULAR | Status: DC
  Administered 2020-06-21 – 2020-06-24 (×8): 5000 [IU] via SUBCUTANEOUS
  Filled 2020-06-21 (×8): qty 1

## 2020-06-21 MED ORDER — ONDANSETRON HCL 4 MG PO TABS: 4.0000 mg | ORAL_TABLET | Freq: Four times a day (QID) | ORAL | Status: DC | PRN

## 2020-06-21 MED ORDER — ACETAMINOPHEN 650 MG RE SUPP: 650.0000 mg | Freq: Four times a day (QID) | RECTAL | Status: DC | PRN

## 2020-06-21 MED ORDER — ACETAMINOPHEN 325 MG PO TABS: 650.0000 mg | ORAL_TABLET | Freq: Four times a day (QID) | ORAL | Status: DC | PRN

## 2020-06-21 MED ORDER — SORBITOL 70 % SOLN
30.0000 mL | Status: DC | PRN
  Filled 2020-06-21: qty 30

## 2020-06-21 MED ORDER — CAMPHOR-MENTHOL 0.5-0.5 % EX LOTN: 1.0000 "application " | TOPICAL_LOTION | Freq: Three times a day (TID) | CUTANEOUS | Status: DC | PRN

## 2020-06-21 MED ORDER — DEXTROSE-NACL 5-0.9 % IV SOLN: INTRAVENOUS | Status: DC

## 2020-06-21 MED ORDER — ZOLPIDEM TARTRATE 5 MG PO TABS
5.0000 mg | ORAL_TABLET | Freq: Every evening | ORAL | Status: DC | PRN
  Filled 2020-06-21: qty 1

## 2020-06-21 MED ORDER — NEPRO/CARBSTEADY PO LIQD
237.0000 mL | Freq: Three times a day (TID) | ORAL | Status: DC | PRN
  Filled 2020-06-21: qty 237

## 2020-06-21 NOTE — ED Notes (Signed)
Pt able to ambulate with pulse ox; pt's oxygen levels remained at 95-100

## 2020-06-21 NOTE — H&P (Signed)
History and Physical   Tara Potts BSJ:[id number] DOB: [birth year] DOA: 06/21/2020  Referring MD/NP/PA: Dr. Roslynn Amble  PCP: [name], MD   Outpatient Specialists: Dr. Donnella Bi cardiology  Patient coming from: Home  Chief Complaint: Generalized weakness  HPI: Tara Potts is a 86 y.o. female with medical history significant of coronary artery disease, carotid artery disease, multiple myeloma, anemia of chronic disease, chronic kidney disease stage III, history of coronary artery bypass grafting, hyperlipidemia, hypertension, peripheral neuropathy among other things who came to the ER from her doctor's office where she went with complaint of generalized weakness and poor oral intake in the last few days.  Patient was evaluated.  Found to be dehydrated.  She also has worsening renal function.  Baseline creatinine around 1 but now 2.8.  Denied any significant symptoms except for the weakness no fever no chills no sick contacts.  Patient seen in the ER and evaluated.  She appears to have acute on chronic kidney disease.  Also possible UTI although she is unable to give symptoms except for frequency and urinary incontinence.  Patient being admitted to the hospital for evaluation of her acute on chronic renal failure suspected to be prerenal..  ED Course: Temperature 97.8 blood pressure 154/52 pulse 87 respiratory rate of 18 oxygen sat 100% room air.  White count 7.1, hemoglobin 9.9 and platelets 159.  Sodium 132 potassium 5.0 chloride 101 CO2 19 BUN 18 and creatinine 2.82 calcium 9.1 glucose 127.  Respiratory viral including Covid negative urinalysis showed cloudy urine with WBC 21-50 with few bacteria and leukocytes positive.  EKG shows normal sinus rhythm.  Chest x-ray showed no acute findings.  Patient being admitted with acute on chronic kidney disease secondary to poor oral intake and possible UTI  Review of Systems: As per HPI otherwise 10 point review of systems negative.    Past  Medical History:  Diagnosis Date  . Anemia   . Arthritis   . Asthma    as a child  . CAD (coronary artery disease)   . Carotid disease, bilateral (HCC)    mild  . Fibroid   . Hx of CABG 2007   [facility]  . Hyperlipemia   . Hypertension   . Menopausal symptoms   . Peripheral neuropathy   . Seizure-like activity [facility])     Past Surgical History:  Procedure Laterality Date  . CARDIAC CATHETERIZATION  01/09/2008   diffusely diseased graft to the PDA & diffuse PDA disease  . CAROTID DOPPLER  08/27/2011   mod. right & nild left ICA stenosis  . CATARACT SURGERY    . CORONARY ARTERY BYPASS GRAFT    . FOOT SURGERY    . JOINT REPLACEMENT     Right hip Dr. Alvan Dame 01-28-18   . MASS REMOVED FROM CERVIX  12/1997   Benign endocervical inclusion cysts  . NM MYOVIEW LTD  01/12/2011   no ischemia  . TOTAL HIP ARTHROPLASTY Right 01/28/2018   Procedure: RIGHT TOTAL HIP ARTHROPLASTY ANTERIOR APPROACH;  Surgeon: Paralee Cancel, MD;  Location: [facility] ORS;  Service: Orthopedics;  Laterality: Right;  70 mins  . TRIPLE HEART BYPASS  05/2006   [facility]  . US ECHOCARDIOGRAPHY  10/30/2007   mild MA,MR,TR,AOV mildly sclerotic,density oted in the prox asc aorta     reports that she has quit smoking. She has never used smokeless tobacco. She reports that she does not drink alcohol and does not use drugs.  Allergies  Allergen Reactions  .  Cymbalta [Duloxetine Hcl] Nausea Only    Family History  Problem Relation Age of Onset  . Diabetes Mother   . Hypertension Mother   . Heart disease Mother   . Heart failure Mother   . Hypertension Sister   . Breast cancer Maternal Aunt        Age 36  . Heart failure Maternal Aunt   . Heart failure Maternal Uncle      Prior to Admission medications   Medication Sig Start Date End Date Taking? Authorizing Provider  Ascorbic Acid (VITAMIN C PO) Take 1 tablet by mouth 2 (two) times daily.     [provider]  aspirin EC 81 MG tablet Take 81 mg  by mouth daily.    [provider]  atorvastatin (LIPITOR) 80 MG tablet Take 80 mg by mouth daily.    [provider]  Calcium-Magnesium-Vitamin D (CALCIUM MAGNESIUM PO) Take 1 tablet by mouth daily.     [provider]  Cholecalciferol (VITAMIN D) 2000 units tablet Take 2,000 Units by mouth daily.    [provider]  Coenzyme Q10 (COQ10 PO) Take 1 capsule by mouth daily.    [provider]  ezetimibe (ZETIA) 10 MG tablet Take 1 tablet (10 mg total) by mouth daily. 12/24/19 03/23/20  Hilty, Nadean Corwin, MD  furosemide (LASIX) 40 MG tablet Take 1 tablet (40 mg total) by mouth 2 (two) times daily. 05/24/20   Lorretta Harp, MD  GARLIC PO Take 1 tablet by mouth 2 (two) times daily.     [provider]  Glucosamine HCl (GLUCOSAMINE PO) Take 1 tablet by mouth daily.    [provider]  hydrALAZINE (APRESOLINE) 25 MG tablet Take 1 tablet (25 mg total) by mouth in the morning and at bedtime. 01/04/20 07/02/20  Erlene Quan, PA-C  losartan-hydrochlorothiazide (HYZAAR) 50-12.5 MG tablet Take 1 tablet by mouth daily. 12/26/17   [provider]  Magnesium 250 MG TABS Take 250 mg by mouth daily.    [provider]  metoprolol tartrate (LOPRESSOR) 50 MG tablet TAKE 1 TABLET(50 MG) BY MOUTH TWICE DAILY 06/10/20   Lorretta Harp, MD  potassium chloride (KLOR-CON) 10 MEQ tablet TAKE 1 TABLET(10 MEQ) BY MOUTH DAILY 11/10/19   Lorretta Harp, MD    Physical Exam: Vitals:   06/21/20 1842 06/21/20 1945 06/21/20 2000 06/21/20 2100  BP: (!) 154/52 (!) 145/66 133/68 139/85  Pulse: 87 77 78 77  Resp: '18 18 17 18  ' Temp:      TempSrc:      SpO2: 100% 100% 100% 100%      Constitutional: Acutely ill looking, frail, no distress Vitals:   06/21/20 1842 06/21/20 1945 06/21/20 2000 06/21/20 2100  BP: (!) 154/52 (!) 145/66 133/68 139/85  Pulse: 87 77 78 77  Resp: '18 18 17 18  ' Temp:      TempSrc:      SpO2: 100% 100% 100% 100%    Eyes: PERRL, lids and conjunctivae normal ENMT: Mucous membranes are dry. Posterior pharynx clear of any exudate or lesions.Normal dentition.  Neck: normal, supple, no masses, no thyromegaly Respiratory: clear to auscultation bilaterally, no wheezing, no crackles. Normal respiratory effort. No accessory muscle use.  Cardiovascular: Regular rate and rhythm, no murmurs / rubs / gallops. No extremity edema. 2+ pedal pulses. No carotid bruits.  Abdomen: no tenderness, no masses palpated. No hepatosplenomegaly. Bowel sounds positive.  Musculoskeletal: no clubbing / cyanosis. No joint deformity upper and lower  extremities. Good ROM, no contractures. Normal muscle tone.  Skin: Dry coarse skin, no rashes, lesions, ulcers. No induration Neurologic: CN 2-12 grossly intact. Sensation intact, DTR normal. Strength 5/5 in all 4.  Psychiatric: Fully awake and alert but fatigue    Labs on Admission: I have personally reviewed following labs and imaging studies  CBC: Recent Labs  Lab 06/21/20 1958  WBC 7.1  NEUTROABS 3.6  HGB 9.9*  HCT 30.4*  MCV 82.4  PLT 476   Basic Metabolic Panel: Recent Labs  Lab 06/21/20 1958  NA 132*  K 5.1  CL 101  CO2 19*  GLUCOSE 127*  BUN 80*  CREATININE 2.82*  CALCIUM 9.1   GFR: CrCl cannot be calculated (Unknown ideal weight.). Liver Function Tests: Recent Labs  Lab 06/21/20 1958  AST 35  ALT 32  ALKPHOS 69  BILITOT 0.9  PROT 9.1*  ALBUMIN 3.9   No results for input(s): LIPASE, AMYLASE in the last 168 hours. No results for input(s): AMMONIA in the last 168 hours. Coagulation Profile: No results for input(s): INR, PROTIME in the last 168 hours. Cardiac Enzymes: No results for input(s): CKTOTAL, CKMB, CKMBINDEX, TROPONINI in the last 168 hours. BNP (last 3 results) No results for input(s): PROBNP in the last 8760 hours. HbA1C: No results for input(s): HGBA1C in the last 72 hours. CBG: No results for input(s): GLUCAP in the last 168  hours. Lipid Profile: No results for input(s): CHOL, HDL, LDLCALC, TRIG, CHOLHDL, LDLDIRECT in the last 72 hours. Thyroid Function Tests: No results for input(s): TSH, T4TOTAL, FREET4, T3FREE, THYROIDAB in the last 72 hours. Anemia Panel: No results for input(s): VITAMINB12, FOLATE, FERRITIN, TIBC, IRON, RETICCTPCT in the last 72 hours. Urine analysis:    Component Value Date/Time   COLORURINE YELLOW 06/21/2020 2043   APPEARANCEUR CLOUDY (A) 06/21/2020 2043   LABSPEC 1.014 06/21/2020 2043   PHURINE 5.0 06/21/2020 2043   GLUCOSEU NEGATIVE 06/21/2020 2043   HGBUR NEGATIVE 06/21/2020 2043   BILIRUBINUR NEGATIVE 06/21/2020 2043   KETONESUR NEGATIVE 06/21/2020 2043   PROTEINUR NEGATIVE 06/21/2020 2043   UROBILINOGEN 0.2 03/03/2014 1146   NITRITE NEGATIVE 06/21/2020 2043   LEUKOCYTESUR LARGE (A) 06/21/2020 2043   Sepsis Labs: '@LABRCNTIP' (procalcitonin:4,lacticidven:4) ) Recent Results (from the past 240 hour(s))  Resp Panel by RT-PCR (Flu A&B, Covid) Nasopharyngeal Swab     Status: None   Collection Time: 06/21/20  7:45 PM   Specimen: Nasopharyngeal Swab; Nasopharyngeal(NP) swabs in vial transport medium  Result Value Ref Range Status   SARS Coronavirus 2 by RT PCR NEGATIVE NEGATIVE Final    Comment: (NOTE) SARS-CoV-2 target nucleic acids are NOT DETECTED.  The SARS-CoV-2 RNA is generally detectable in upper respiratory specimens during the acute phase of infection. The lowest concentration of SARS-CoV-2 viral copies this assay can detect is 138 copies/mL. A negative result does not preclude SARS-Cov-2 infection and should not be used as the sole basis for treatment or other patient management decisions. A negative result may occur with  improper specimen collection/handling, submission of specimen other than nasopharyngeal swab, presence of viral mutation(s) within the areas targeted by this assay, and inadequate number of viral copies(<138 copies/mL). A negative result must be  combined with clinical observations, patient history, and epidemiological information. The expected result is Negative.  Fact Sheet for Patients:  EntrepreneurPulse.com.au  Fact Sheet for Healthcare Providers:  IncredibleEmployment.be  This test is no t yet approved or cleared by the [place] FDA and  has been authorized for detection and/or  diagnosis of SARS-CoV-2 by FDA under an Emergency Use Authorization (EUA). This EUA will remain  in effect (meaning this test can be used) for the duration of the COVID-19 declaration under Section 564(b)(1) of the Act, 21 U.S.C.section 360bbb-3(b)(1), unless the authorization is terminated  or revoked sooner.       Influenza A by PCR NEGATIVE NEGATIVE Final   Influenza B by PCR NEGATIVE NEGATIVE Final    Comment: (NOTE) The Xpert Xpress SARS-CoV-2/FLU/RSV plus assay is intended as an aid in the diagnosis of influenza from Nasopharyngeal swab specimens and should not be used as a sole basis for treatment. Nasal washings and aspirates are unacceptable for Xpert Xpress SARS-CoV-2/FLU/RSV testing.  Fact Sheet for Patients: EntrepreneurPulse.com.au  Fact Sheet for Healthcare Providers: IncredibleEmployment.be  This test is not yet approved or cleared by the [place] FDA and has been authorized for detection and/or diagnosis of SARS-CoV-2 by FDA under an Emergency Use Authorization (EUA). This EUA will remain in effect (meaning this test can be used) for the duration of the COVID-19 declaration under Section 564(b)(1) of the Act, 21 U.S.C. section 360bbb-3(b)(1), unless the authorization is terminated or revoked.  Performed at [facility], [place] [address], [place], [place] [address]      Radiological Exams on Admission: DG Chest 2 View  Result Date: 06/21/2020 CLINICAL DATA:  Shortness of breath EXAM: CHEST - 2 VIEW COMPARISON:  None.  FINDINGS: The heart size and mediastinal contours are within normal limits. Both lungs are clear. No pleural effusion or pneumothorax. The visualized skeletal structures are unremarkable. IMPRESSION: No acute process in the chest. Electronically Signed   By: Macy Mis M.D.   On: 06/21/2020 14:18      Assessment/Plan Principal Problem:   Acute renal failure superimposed on stage 3 chronic kidney disease (HCC) Active Problems:   Dyslipidemia, goal LDL below 70   Hx of CABG   Carotid artery disease (HCC)   Multiple myeloma (HCC)   Acute lower UTI   Dehydration   Hyponatremia     #1 acute on chronic kidney disease stage III: Patient's previous creatinine was 1.52 on May 7 this year.  She appears to have worsening to 2.8 to now.  Most likely prerenal.  She has low sodium of relatively low chloride indicating possible dehydration.  We will admit the patient.  Aggressively hydrate.  If no improvement evaluate for possible intrarenal and postrenal causes by renal ultrasound.  She has multiple myeloma.  #2 UTI presumably: Empiric Rocephin.  Urine culture and sensitivity sent.  Follow results  #3 hyponatremia: Probably reflective of dehydration.  Hydrate with saline and monitor  #4 anorexia: May be related to early UTI or general disease.  She also has history of multiple myeloma which could be flaring up.  Continue to monitor and may try Megace if appetite does not improve with treatment.  #5 hyperlipidemia: May resume home regimen.  #6 hypertension: Patient was on diuretic at home which may have contributed to the acute kidney failure and dehydration.  Hold diuretics.  Will not also use ARB ACE inhibitor at this point.  Monitor and may treat with amlodipine or beta-blockers if tolerated.  #7 history of carotid artery disease: Stable.  Continue outpatient follow-up  #8 coronary artery disease: Stable.  Monitor closely  #9 generalized weakness: We will get PT and OT  consultation   DVT prophylaxis: Heparin Code Status: Full code Family Communication: No family at bedside Disposition Plan: Home Consults called: None Admission  status: Observation  Severity of Illness: The appropriate patient status for this patient is OBSERVATION. Observation status is judged to be reasonable and necessary in order to provide the required intensity of service to ensure the patient's safety. The patient's presenting symptoms, physical exam findings, and initial radiographic and laboratory data in the context of their medical condition is felt to place them at decreased risk for further clinical deterioration. Furthermore, it is anticipated that the patient will be medically stable for discharge from the hospital within 2 midnights of admission. The following factors support the patient status of observation.   " The patient's presenting symptoms include poor appetite and weakness. " The physical exam findings include generalized fatigue. " The initial radiographic and laboratory data are worsening renal function.     Barbette Merino MD [facility] Pager [phone]  If 7PM-7AM, please contact night-coverage [email or web address] Password [facility]  06/21/2020, 9:54 PM

## 2020-06-21 NOTE — ED Provider Notes (Signed)
[place] DEPT Provider Note   CSN: [id number] Arrival date & time: 06/21/20  1314     History Chief Complaint  Patient presents with  . Shortness of Breath    GEORGANNE SIPLE is a 86 y.o. female with pertinent past medical history of hyperlipidemia, hypertension, CAD that presents emergency department today for weakness and shortness of breath. Patient states that she has been having worsening fatigue and shortness of breath over the past week, went to her PCP today who referred her to the emergency department for further evaluation. Patient reports new exertional shortness of breath for the past week, has been having to use 3 pillows to sleep. She did see cardiology in November with echo in May showing normal LV systolic function. Was recently started on spironolactone for her lower extremity edema. She was also referred to heme-onc due to a M spike that was noted on recent labs, does have oncology appointment coming up. Patient also states that she is lost 15 pounds over the last month. Denies any fevers, chills, nausea, vomiting. States that she has not been eating because she has decreased appetite for the past week. Has not been drinking much water due to similar reason. Patient lives with daughter at home. Did admit to chest pain at her PCP, however denies any chest pain to me now. Patient states that she is not currently having shortness of breath while she is at rest. Denies any urinary symptoms.  HPI     Past Medical History:  Diagnosis Date  . Anemia   . Arthritis   . Asthma    as a child  . CAD (coronary artery disease)   . Carotid disease, bilateral (HCC)    mild  . Fibroid   . Hx of CABG 2007   [facility]  . Hyperlipemia   . Hypertension   . Menopausal symptoms   . Peripheral neuropathy   . Seizure-like activity [facility])     Patient Active Problem List   Diagnosis Date Noted  . Gait abnormality 04/14/2020  . Peripheral  neuropathy 04/14/2020  . CRI (chronic renal insufficiency), stage 3 (moderate) ([place]) 11/20/2019  . Bilateral lower extremity edema 10/23/2019  . Multiple myeloma ([place]) 05/26/2018  . Goals of care, counseling/discussion 05/26/2018  . Obese 01/29/2018  . S/P right THA, AA 01/28/2018  . S/P hip replacement 01/28/2018  . Carotid artery disease ([place]) 04/12/2017  . Passed out 10/05/2015  . Lumbosacral radiculopathy 09/10/2014  . Cervical stenosis of spinal canal 09/10/2014  . Abnormality of gait 07/27/2014  . Paresthesia 07/27/2014  . Bilateral leg cramps 02/21/2014  . Hx of CABG 01/13/2013  . Peripheral vascular disease:  Moderate right and mild left ICA stenosis. 01/13/2013  . Essential hypertension 01/13/2013  . Dyslipidemia, goal LDL below 70   . Menopause 02/12/2011  . Leiomyoma of body of uterus 02/12/2011  . Post-menopausal atrophic vaginitis 02/12/2011  . Urinary frequency 02/12/2011  . UNSPECIFIED PERIPHERAL VASCULAR DISEASE 07/11/2010    Past Surgical History:  Procedure Laterality Date  . CARDIAC CATHETERIZATION  01/09/2008   diffusely diseased graft to the PDA & diffuse PDA disease  . CAROTID DOPPLER  08/27/2011   mod. right & nild left ICA stenosis  . CATARACT SURGERY    . CORONARY ARTERY BYPASS GRAFT    . FOOT SURGERY    . JOINT REPLACEMENT     Right hip Dr. Alvan Dame 01-28-18   . MASS REMOVED FROM CERVIX  12/1997   Benign endocervical  inclusion cysts  . NM MYOVIEW LTD  01/12/2011   no ischemia  . TOTAL HIP ARTHROPLASTY Right 01/28/2018   Procedure: RIGHT TOTAL HIP ARTHROPLASTY ANTERIOR APPROACH;  Surgeon: Paralee Cancel, MD;  Location: [facility] ORS;  Service: Orthopedics;  Laterality: Right;  70 mins  . TRIPLE HEART BYPASS  05/2006   [facility]  . US ECHOCARDIOGRAPHY  10/30/2007   mild MA,MR,TR,AOV mildly sclerotic,density oted in the prox asc aorta     OB History    Gravida  1   Para  1   Term  1   Preterm      AB      Living  1     SAB      TAB       Ectopic      Multiple      Live Births              Family History  Problem Relation Age of Onset  . Diabetes Mother   . Hypertension Mother   . Heart disease Mother   . Heart failure Mother   . Hypertension Sister   . Breast cancer Maternal Aunt        Age 62  . Heart failure Maternal Aunt   . Heart failure Maternal Uncle     Social History   Tobacco Use  . Smoking status: Former Research scientist (life sciences)  . Smokeless tobacco: Never Used  . Tobacco comment: Quit 30 years ago  Vaping Use  . Vaping Use: Never used  Substance Use Topics  . Alcohol use: No    Alcohol/week: 0.0 standard drinks  . Drug use: No    Home Medications Prior to Admission medications   Medication Sig Start Date End Date Taking? Authorizing Provider  Ascorbic Acid (VITAMIN C PO) Take 1 tablet by mouth 2 (two) times daily.     [provider]  aspirin EC 81 MG tablet Take 81 mg by mouth daily.    [provider]  atorvastatin (LIPITOR) 80 MG tablet Take 80 mg by mouth daily.    [provider]  Calcium-Magnesium-Vitamin D (CALCIUM MAGNESIUM PO) Take 1 tablet by mouth daily.     [provider]  Cholecalciferol (VITAMIN D) 2000 units tablet Take 2,000 Units by mouth daily.    [provider]  Coenzyme Q10 (COQ10 PO) Take 1 capsule by mouth daily.    [provider]  ezetimibe (ZETIA) 10 MG tablet Take 1 tablet (10 mg total) by mouth daily. 12/24/19 03/23/20  Hilty, Nadean Corwin, MD  furosemide (LASIX) 40 MG tablet Take 1 tablet (40 mg total) by mouth 2 (two) times daily. 05/24/20   Lorretta Harp, MD  GARLIC PO Take 1 tablet by mouth 2 (two) times daily.     [provider]  Glucosamine HCl (GLUCOSAMINE PO) Take 1 tablet by mouth daily.    [provider]  hydrALAZINE (APRESOLINE) 25 MG tablet Take 1 tablet (25 mg total) by mouth in the morning and at bedtime. 01/04/20 07/02/20  Erlene Quan, PA-C  losartan-hydrochlorothiazide (HYZAAR) 50-12.5 MG  tablet Take 1 tablet by mouth daily. 12/26/17   [provider]  Magnesium 250 MG TABS Take 250 mg by mouth daily.    [provider]  metoprolol tartrate (LOPRESSOR) 50 MG tablet TAKE 1 TABLET(50 MG) BY MOUTH TWICE DAILY 06/10/20   Lorretta Harp, MD  potassium chloride (KLOR-CON) 10 MEQ tablet TAKE 1 TABLET(10 MEQ) BY MOUTH DAILY 11/10/19  Lorretta Harp, MD    Allergies    Cymbalta [duloxetine hcl]  Review of Systems   Review of Systems  Constitutional: Negative for chills, diaphoresis, fatigue and fever.  HENT: Negative for congestion, sore throat and trouble swallowing.   Eyes: Negative for pain and visual disturbance.  Respiratory: Positive for shortness of breath. Negative for cough and wheezing.   Cardiovascular: Negative for chest pain, palpitations and leg swelling.  Gastrointestinal: Negative for abdominal distention, abdominal pain, diarrhea, nausea and vomiting.  Genitourinary: Negative for difficulty urinating.  Musculoskeletal: Negative for back pain, neck pain and neck stiffness.  Skin: Negative for pallor.  Neurological: Positive for weakness. Negative for dizziness, seizures, speech difficulty, numbness and headaches.  Psychiatric/Behavioral: Negative for confusion.    Physical Exam Updated Vital Signs BP 139/85   Pulse 77   Temp 97.8 F (36.6 C) (Oral)   Resp 18   LMP 07/16/2002   SpO2 100%   Physical Exam Constitutional:      General: She is not in acute distress.    Appearance: Normal appearance. She is not ill-appearing, toxic-appearing or diaphoretic.  HENT:     Head: Normocephalic and atraumatic.     Mouth/Throat:     Mouth: Mucous membranes are dry.     Pharynx: Oropharynx is clear.  Eyes:     General: No scleral icterus.    Extraocular Movements: Extraocular movements intact.     Pupils: Pupils are equal, round, and reactive to light.  Cardiovascular:     Rate and Rhythm: Normal rate and regular rhythm.     Pulses:  Normal pulses.     Heart sounds: Normal heart sounds.  Pulmonary:     Effort: Pulmonary effort is normal. No respiratory distress.     Breath sounds: Normal breath sounds. No stridor. No wheezing, rhonchi or rales.  Chest:     Chest wall: No tenderness.  Abdominal:     General: Abdomen is flat. There is no distension.     Palpations: Abdomen is soft.     Tenderness: There is no abdominal tenderness. There is no guarding or rebound.  Musculoskeletal:        General: No swelling or tenderness. Normal range of motion.     Cervical back: Normal range of motion and neck supple. No rigidity.     Right lower leg: No edema.     Left lower leg: No edema.  Skin:    General: Skin is warm and dry.     Capillary Refill: Capillary refill takes less than 2 seconds.     Coloration: Skin is not pale.  Neurological:     General: No focal deficit present.     Mental Status: She is alert and oriented to person, place, and time.     Comments: Alert. Clear speech. No facial droop. CNIII-XII grossly intact. Bilateral upper and lower extremities' sensation grossly intact. 4/5 symmetric strength with grip strength and with plantar and dorsi flexion bilaterally.  Psychiatric:        Mood and Affect: Mood normal.        Behavior: Behavior normal.     ED Results / Procedures / Treatments   Labs (all labs ordered are listed, but only abnormal results are displayed) Labs Reviewed  COMPREHENSIVE METABOLIC PANEL - Abnormal; Notable for the following components:      Result Value   Sodium 132 (*)    CO2 19 (*)    Glucose, Bld 127 (*)    BUN 80 (*)  Creatinine, Ser 2.82 (*)    Total Protein 9.1 (*)    GFR, Estimated 16 (*)    All other components within normal limits  CBC WITH DIFFERENTIAL/PLATELET - Abnormal; Notable for the following components:   RBC 3.69 (*)    Hemoglobin 9.9 (*)    HCT 30.4 (*)    RDW 17.4 (*)    All other components within normal limits  URINALYSIS, ROUTINE W REFLEX  MICROSCOPIC - Abnormal; Notable for the following components:   APPearance CLOUDY (*)    Leukocytes,Ua LARGE (*)    Bacteria, UA FEW (*)    Non Squamous Epithelial 0-5 (*)    All other components within normal limits  BRAIN NATRIURETIC PEPTIDE - Abnormal; Notable for the following components:   B Natriuretic Peptide 109.3 (*)    All other components within normal limits  TROPONIN I (HIGH SENSITIVITY) - Abnormal; Notable for the following components:   Troponin I (High Sensitivity) 20 (*)    All other components within normal limits  RESP PANEL BY RT-PCR (FLU A&B, COVID) [id number]  TROPONIN I (HIGH SENSITIVITY)    EKG EKG Interpretation  Date/Time:  Tuesday June 21 2020 13:26:03 EST Ventricular Rate:  77 PR Interval:    QRS Duration: 94 QT Interval:  379 QTC Calculation: 429 R Axis:   88 Text Interpretation: Sinus rhythm Borderline right axis deviation 12 Lead; [place] Confirmed by Madalyn Rob [phone]) on 06/21/2020 8:08:11 PM   Radiology DG Chest 2 View  Result Date: 06/21/2020 CLINICAL DATA:  Shortness of breath EXAM: CHEST - 2 VIEW COMPARISON:  None. FINDINGS: The heart size and mediastinal contours are within normal limits. Both lungs are clear. No pleural effusion or pneumothorax. The visualized skeletal structures are unremarkable. IMPRESSION: No acute process in the chest. Electronically Signed   By: Macy Mis M.D.   On: 06/21/2020 14:18    Procedures Procedures (including critical care time)  Medications Ordered in ED Medications  sodium chloride 0.9 % bolus 500 mL (500 mLs Intravenous New Bag/Given 06/21/20 2042)    ED Course  I have reviewed the triage vital signs and the nursing notes.  Pertinent labs & imaging results that were available during my care of the patient were reviewed by me and considered in my medical decision making (see chart for details).    MDM Rules/Calculators/A&P                          JALIA ZUNIGA is a 86 y.o. female  with pertinent past medical history of hyperlipidemia, hypertension, CAD that presents emergency department today for weakness and shortness of breath. Patient with normal neuro exam, does appear generally weak, no focal weakness. Patient states this is new for her, with worsening shortness of breath mainly on exertion I am. worried for heart failure at this time.  EKG interpreted by me with normal sinus rhythm, chest x-ray interpreted me without any acute cardiopulmonary disease.  Work-up today does show creatinine elevation of 2.82 with elevated BUN, this is most likely from low p.o. intake and dehydration, patient's baseline is 1.6. Hemoglobin 9.9, baseline 10.7.  Fluid bolus initiated.  Troponin 20, unsure what baseline is. Patient does have degree of CAD. BNP 109.3. Covid negative. I think it would be beneficial for patient to be admitted at this time for worsening fatigue with poor p.o. intake and AKI, will benefit from IV hydration and further evaluation for her dyspnea.  Urinalysis with questionable UTI,  urine culture obtained.  Patient is not having any urinary symptoms.  951 spoke to Dr. Jonelle Sidle, hospitalist who will accept the patient.  The patient appears reasonably stabilized for admission considering the current resources, flow, and capabilities available in the ED at this time, and I doubt any other [facility] requiring further screening and/or treatment in the ED prior to admission.  I discussed this case with my attending physician who cosigned this note including patient's presenting symptoms, physical exam, and planned diagnostics and interventions. Attending physician stated agreement with plan or made changes to plan which were implemented.   Attending physician assessed patient at bedside.  Final Clinical Impression(s) / ED Diagnoses Final diagnoses:  Fatigue, unspecified type  AKI (acute kidney injury) [facility])    Rx / DC Orders ED Discharge Orders    None       Alfredia Client,  PA-C 06/21/20 2157    Lucrezia Starch, MD 06/21/20 [phone]

## 2020-06-21 NOTE — ED Triage Notes (Signed)
Pt presents with c/o shortness of breath. Pt went to her MD today and was sent her for shortness of breath and some weakness. Pt has also had a weight loss of approx 15 pounds over the past month.

## 2020-06-22 ENCOUNTER — Observation Stay (HOSPITAL_COMMUNITY): Payer: Medicare HMO

## 2020-06-22 ENCOUNTER — Observation Stay (HOSPITAL_BASED_OUTPATIENT_CLINIC_OR_DEPARTMENT_OTHER): Payer: Medicare HMO

## 2020-06-22 DIAGNOSIS — C9000 Multiple myeloma not having achieved remission: Secondary | ICD-10-CM | POA: Diagnosis not present

## 2020-06-22 DIAGNOSIS — N390 Urinary tract infection, site not specified: Secondary | ICD-10-CM | POA: Diagnosis not present

## 2020-06-22 DIAGNOSIS — E871 Hypo-osmolality and hyponatremia: Secondary | ICD-10-CM | POA: Diagnosis not present

## 2020-06-22 DIAGNOSIS — Z951 Presence of aortocoronary bypass graft: Secondary | ICD-10-CM | POA: Diagnosis not present

## 2020-06-22 DIAGNOSIS — N1831 Chronic kidney disease, stage 3a: Secondary | ICD-10-CM

## 2020-06-22 DIAGNOSIS — R609 Edema, unspecified: Secondary | ICD-10-CM

## 2020-06-22 DIAGNOSIS — D649 Anemia, unspecified: Secondary | ICD-10-CM

## 2020-06-22 DIAGNOSIS — N170 Acute kidney failure with tubular necrosis: Secondary | ICD-10-CM

## 2020-06-22 DIAGNOSIS — R944 Abnormal results of kidney function studies: Secondary | ICD-10-CM | POA: Diagnosis not present

## 2020-06-22 DIAGNOSIS — N179 Acute kidney failure, unspecified: Secondary | ICD-10-CM | POA: Diagnosis not present

## 2020-06-22 DIAGNOSIS — E860 Dehydration: Secondary | ICD-10-CM | POA: Diagnosis not present

## 2020-06-22 LAB — COMPREHENSIVE METABOLIC PANEL
ALT: 27 U/L (ref 0–44)
AST: 32 U/L (ref 15–41)
Albumin: 3.2 g/dL — ABNORMAL LOW (ref 3.5–5.0)
Alkaline Phosphatase: 57 U/L (ref 38–126)
Anion gap: 9 (ref 5–15)
BUN: 70 mg/dL — ABNORMAL HIGH (ref 8–23)
CO2: 18 mmol/L — ABNORMAL LOW (ref 22–32)
Calcium: 8.4 mg/dL — ABNORMAL LOW (ref 8.9–10.3)
Chloride: 108 mmol/L (ref 98–111)
Creatinine, Ser: 2.25 mg/dL — ABNORMAL HIGH (ref 0.44–1.00)
GFR, Estimated: 21 mL/min — ABNORMAL LOW (ref >60)
Glucose, Bld: 126 mg/dL — ABNORMAL HIGH (ref 70–99)
Potassium: 4.5 mmol/L (ref 3.5–5.1)
Sodium: 135 mmol/L (ref 135–145)
Total Bilirubin: 0.6 mg/dL (ref 0.3–1.2)
Total Protein: 7.7 g/dL (ref 6.5–8.1)

## 2020-06-22 LAB — CBC
HCT: 25.9 % — ABNORMAL LOW (ref 36.0–46.0)
Hemoglobin: 8.7 g/dL — ABNORMAL LOW (ref 12.0–15.0)
MCH: 27.5 pg (ref 26.0–34.0)
MCHC: 33.6 g/dL (ref 30.0–36.0)
MCV: 82 fL (ref 80.0–100.0)
Platelets: 132 10*3/uL — ABNORMAL LOW (ref 150–400)
RBC: 3.16 MIL/uL — ABNORMAL LOW (ref 3.87–5.11)
RDW: 17.4 % — ABNORMAL HIGH (ref 11.5–15.5)
WBC: 5.8 10*3/uL (ref 4.0–10.5)
nRBC: 0 % (ref 0.0–0.2)

## 2020-06-22 LAB — MRSA PCR SCREENING: MRSA by PCR: NEGATIVE

## 2020-06-22 MED ORDER — SODIUM CHLORIDE 0.9 % IV SOLN: INTRAVENOUS | Status: AC

## 2020-06-22 MED ORDER — SODIUM BICARBONATE 650 MG PO TABS
650.0000 mg | ORAL_TABLET | Freq: Two times a day (BID) | ORAL | Status: DC
  Administered 2020-06-22 – 2020-06-24 (×5): 650 mg via ORAL
  Filled 2020-06-22 (×5): qty 1

## 2020-06-22 MED ORDER — METOPROLOL TARTRATE 25 MG PO TABS
25.0000 mg | ORAL_TABLET | Freq: Two times a day (BID) | ORAL | Status: DC
  Administered 2020-06-22 – 2020-06-23 (×3): 25 mg via ORAL
  Filled 2020-06-22 (×3): qty 1

## 2020-06-22 NOTE — [place] Notes (Addendum)
PROGRESS NOTE    Tara Potts  ACZ:[id number] DOB: 1933-11-16 DOA: 06/21/2020 PCP: [name], MD    Chief Complaint  Patient presents with  . Shortness of Breath    Brief Narrative:  Per HPI: Tara Potts is a 86 y.o. female with medical history significant of coronary artery disease, carotid artery disease, multiple myeloma, anemia of chronic disease, chronic kidney disease stage III, history of coronary artery bypass grafting, hyperlipidemia, hypertension, peripheral neuropathy among other things who came to the ER from her doctor's office where she went with complaint of generalized weakness and poor oral intake in the last few days.  Patient was evaluated.  Found to be dehydrated.  She also has worsening renal function.  Baseline creatinine around 1 but now 2.8.  Subjective:  Feeling weak, report no appetite, denies pain, denier urinary symptom Daughter at bedside  Assessment & Plan:   Principal Problem:   Acute renal failure superimposed on stage 3 chronic kidney disease (HCC) Active Problems:   Dyslipidemia, goal LDL below 70   Hx of CABG   Carotid artery disease (HCC)   Multiple myeloma (HCC)   Acute lower UTI   Dehydration   Hyponatremia   AKI on CKD 3B -Patient reports she had bilateral lower extremity edema was started on spironolactone 2 weeks ago, edema has improved, but she started to have poor appetite -UA cloudy with large leukocyte few bacteria, urine culture pending -She is on IV hydration and antibiotics  -Check spep/upep due to h/o multiple myeloma, does has progressive anemia, calcium unremarkable -get renal ultrasound Hold Lasix, spironolactone, Hold losartan HCTZ  Hyponatremia Sodium 132 on presentation, improved on hydration  History of multiple myeloma under observation Followed by Dr. Julien Nordmann Check spep/upep  Normocytic anemia -Does appear to have progressive decrease of hemoglobin -No overt sign of external bleed -Check  spep/upep -check iron panel,b12, folate, tsh, check FOBT  Bilateral lower extremity edema Check venous doppler   Hypertension Resume Lopressor at lower dose, hold losartan HCTZ and Lasix  CAD, history of carotid artery disease Continue aspirin 81 mg daily, Lipitor 80 mg daily, Zetia, Lopressor  liberalize diet for now due to poor oral intake, change to heart healthy diet once oral intake improves   Anorexia, weight loss of 15 pounds, generalized weakness, PT recommended home health     DVT prophylaxis: heparin injection 5,000 Units Start: 06/21/20 2200   Code Status: Full Family Communication: Daughter at bedside Disposition:    Dispo: The patient is from: Home              Anticipated d/c is to: Home with home health              Anticipated d/c date is: 24 to 48 hours pending renal function                Consultants:   None  Procedures:   None  Antimicrobials:   None     Objective: Vitals:   06/22/20 0300 06/22/20 0450 06/22/20 0600 06/22/20 0700  BP: (!) 145/57 (!) 141/55 (!) 151/60 (!) 166/58  Pulse: 78 78 73 89  Resp: '19 18 18 19  ' Temp:      TempSrc:      SpO2: 99% 100% 100% 100%   No intake or output data in the 24 hours ending 06/22/20 0843 There were no vitals filed for this visit.  Examination:  General exam: Weak, and frail ,calm, NAD Respiratory system: Clear to auscultation. Respiratory effort  normal. Cardiovascular system: S1 & S2 heard, RRR. No JVD, no murmur, No pedal edema. Gastrointestinal system: Abdomen is nondistended, soft and nontender.  Normal bowel sounds heard. Central nervous system: Alert and oriented. No focal neurological deficits. Extremities: Generalized weakness, trace bilateral lower extremity edema, left > right Skin: No rashes, lesions or ulcers Psychiatry: Judgement and insight appear normal. Mood & affect appropriate.     Data Reviewed: I have personally reviewed following labs and imaging  studies  CBC: Recent Labs  Lab 06/21/20 1958 06/22/20 0500  WBC 7.1 5.8  NEUTROABS 3.6  --   HGB 9.9* 8.7*  HCT 30.4* 25.9*  MCV 82.4 82.0  PLT 159 132*    Basic Metabolic Panel: Recent Labs  Lab 06/21/20 1958 06/22/20 0500  NA 132* 135  K 5.1 4.5  CL 101 108  CO2 19* 18*  GLUCOSE 127* 126*  BUN 80* 70*  CREATININE 2.82* 2.25*  CALCIUM 9.1 8.4*    GFR: CrCl cannot be calculated (Unknown ideal weight.).  Liver Function Tests: Recent Labs  Lab 06/21/20 1958 06/22/20 0500  AST 35 32  ALT 32 27  ALKPHOS 69 57  BILITOT 0.9 0.6  PROT 9.1* 7.7  ALBUMIN 3.9 3.2*    CBG: No results for input(s): GLUCAP in the last 168 hours.   Recent Results (from the past 240 hour(s))  Resp Panel by RT-PCR (Flu A&B, Covid) Nasopharyngeal Swab     Status: None   Collection Time: 06/21/20  7:45 PM   Specimen: Nasopharyngeal Swab; Nasopharyngeal(NP) swabs in vial transport medium  Result Value Ref Range Status   SARS Coronavirus 2 by RT PCR NEGATIVE NEGATIVE Final    Comment: (NOTE) SARS-CoV-2 target nucleic acids are NOT DETECTED.  The SARS-CoV-2 RNA is generally detectable in upper respiratory specimens during the acute phase of infection. The lowest concentration of SARS-CoV-2 viral copies this assay can detect is 138 copies/mL. A negative result does not preclude SARS-Cov-2 infection and should not be used as the sole basis for treatment or other patient management decisions. A negative result may occur with  improper specimen collection/handling, submission of specimen other than nasopharyngeal swab, presence of viral mutation(s) within the areas targeted by this assay, and inadequate number of viral copies(<138 copies/mL). A negative result must be combined with clinical observations, patient history, and epidemiological information. The expected result is Negative.  Fact Sheet for Patients:  EntrepreneurPulse.com.au  Fact Sheet for Healthcare  Providers:  IncredibleEmployment.be  This test is no t yet approved or cleared by the [place] FDA and  has been authorized for detection and/or diagnosis of SARS-CoV-2 by FDA under an Emergency Use Authorization (EUA). This EUA will remain  in effect (meaning this test can be used) for the duration of the COVID-19 declaration under Section 564(b)(1) of the Act, 21 U.S.C.section 360bbb-3(b)(1), unless the authorization is terminated  or revoked sooner.       Influenza A by PCR NEGATIVE NEGATIVE Final   Influenza B by PCR NEGATIVE NEGATIVE Final    Comment: (NOTE) The Xpert Xpress SARS-CoV-2/FLU/RSV plus assay is intended as an aid in the diagnosis of influenza from Nasopharyngeal swab specimens and should not be used as a sole basis for treatment. Nasal washings and aspirates are unacceptable for Xpert Xpress SARS-CoV-2/FLU/RSV testing.  Fact Sheet for Patients: EntrepreneurPulse.com.au  Fact Sheet for Healthcare Providers: IncredibleEmployment.be  This test is not yet approved or cleared by the [place] FDA and has been authorized for detection and/or diagnosis of SARS-CoV-2  by FDA under an Emergency Use Authorization (EUA). This EUA will remain in effect (meaning this test can be used) for the duration of the COVID-19 declaration under Section 564(b)(1) of the Act, 21 U.S.C. section 360bbb-3(b)(1), unless the authorization is terminated or revoked.  Performed at [facility], [place] [address], [place], [place] [address]          Radiology Studies: DG Chest 2 View  Result Date: 06/21/2020 CLINICAL DATA:  Shortness of breath EXAM: CHEST - 2 VIEW COMPARISON:  None. FINDINGS: The heart size and mediastinal contours are within normal limits. Both lungs are clear. No pleural effusion or pneumothorax. The visualized skeletal structures are unremarkable. IMPRESSION: No acute process in the chest.  Electronically Signed   By: Macy Mis M.D.   On: 06/21/2020 14:18        Scheduled Meds: . heparin  5,000 Units Subcutaneous Q8H   Continuous Infusions: . cefTRIAXone (ROCEPHIN)  IV Stopped (06/21/20 2252)  . dextrose 5 % and 0.9% NaCl 100 mL/hr at 06/21/20 2220     LOS: 0 days   Time spent: 25mns Greater than 50% of this time was spent in counseling, explanation of diagnosis, planning of further management, and coordination of care.  I have personally reviewed and interpreted on  06/22/2020 daily labs, imagings as discussed above under date review session and assessment and plans.  I reviewed all nursing notes, pharmacy notes, consultant notes,  vitals, pertinent old records  I have discussed plan of care as described above with RN , patient and family on 06/22/2020  Voice Recognition /Dragon dictation system was used to create this note, attempts have been made to correct errors. Please contact the author with questions and/or clarifications.   FFlorencia Reasons MD PhD FACP [facility]  Available via Epic secure chat 7am-7pm for nonurgent issues Please page for urgent issues To page the attending provider between 7A-7P or the covering provider during after hours 7P-7A, please log into the web site [email or web address] and access using universal [place] password for that web site. If you do not have the password, please call the hospital operator.    06/22/2020, 8:43 AM

## 2020-06-22 NOTE — ED Notes (Signed)
Report called to [facility] RN.

## 2020-06-22 NOTE — Evaluation (Signed)
Physical Therapy Evaluation Patient Details Name: Tara Potts MRN: [id number] DOB: 1933/09/15 Today's Date: 06/22/2020   History of Present Illness  86 y.o. female with medical history significant of coronary artery disease, carotid artery disease, multiple myeloma, anemia of chronic disease, chronic kidney disease stage III, history of coronary artery bypass grafting, hyperlipidemia, hypertension, peripheral neuropathy among other things who came to the ER from her doctor's office where she went with complaint of generalized weakness and poor oral intake in the last few days.  Dx of UTI, dehydration, acute renal failure on chronic kidney disease.  Clinical Impression  Pt admitted with above diagnosis. Pt ambulates with a cane at baseline. During PT session, pt was unsteady with a cane, so we switched to a RW. Pt was steady with RW and was able to ambulate 180' without loss of balance. Instructed pt in seated BUE/LE exercises for strengthening.  Pt currently with functional limitations due to the deficits listed below (see PT Problem List). Pt will benefit from skilled PT to increase their independence and safety with mobility to allow discharge to the venue listed below.       Follow Up Recommendations Home health PT;Supervision for mobility/OOB    Equipment Recommendations  Rolling walker with 5" wheels    Recommendations for Other Services       Precautions / Restrictions Precautions Precautions: Fall Precaution Comments: pt denies h/o falls Restrictions Weight Bearing Restrictions: No      Mobility  Bed Mobility Overal bed mobility: Modified Independent             General bed mobility comments: HOB up ~30*    Transfers Overall transfer level: Needs assistance Equipment used: Rolling walker (2 wheeled) Transfers: Sit to/from Stand Sit to Stand: Min assist         General transfer comment: VCs hand placement, min A to power up and  steady  Ambulation/Gait Ambulation/Gait assistance: Min guard Gait Distance (Feet): 180 Feet Assistive device: Rolling walker (2 wheeled) Gait Pattern/deviations: Step-through pattern;Decreased stride length;Narrow base of support Gait velocity: decr   General Gait Details: pt initially ambulated 20' with her SPC from home, she was unsteady so switched to a RW. Pt steady with RW. VCs for positioning in RW.  Stairs            Wheelchair Mobility    Modified Rankin (Stroke Patients Only)       Balance Overall balance assessment: Needs assistance   Sitting balance-Leahy Scale: Good       Standing balance-Leahy Scale: Fair                               Pertinent Vitals/Pain Pain Assessment: 0-10 Pain Score: 5  Pain Location: B feet with walking Pain Descriptors / Indicators: Tender Pain Intervention(s): Limited activity within patient's tolerance;Monitored during session;Repositioned    Home Living Family/patient expects to be discharged to:: (P) Private residence Living Arrangements: (P) Children Available Help at Discharge: (P) Available 24 hours/day   Home Access: (P) Stairs to enter Entrance Stairs-Rails: (P) None Entrance Stairs-Number of Steps: (P) 2 Home Layout: (P) Two level;Bed/bath upstairs Home Equipment: (P) Cane - single point      Prior Function Level of Independence: (P) Needs assistance   Gait / Transfers Assistance Needed: (P) walks with SPC, denies falls in past 1 year  ADL's / Homemaking Assistance Needed: (P) sponge bathes bc they don't have a shower seat  Hand Dominance        Extremity/Trunk Assessment   Upper Extremity Assessment Upper Extremity Assessment: Overall WFL for tasks assessed    Lower Extremity Assessment Lower Extremity Assessment: Overall WFL for tasks assessed (sensation intact to light touch B feet, B knee ext +4/5)    Cervical / Trunk Assessment Cervical / Trunk Assessment: Normal   Communication   Communication: (P) No difficulties  Cognition Arousal/Alertness: Awake/alert Behavior During Therapy: WFL for tasks assessed/performed Overall Cognitive Status: Within Functional Limits for tasks assessed                                        General Comments      Exercises General Exercises - Upper Extremity Shoulder Flexion: AROM;Both;5 reps;Seated General Exercises - Lower Extremity Ankle Circles/Pumps: AROM;Both;10 reps;Seated Long Arc Quad: AROM;Both;10 reps;Seated Hip Flexion/Marching: AROM;Both;10 reps;Seated   Assessment/Plan    PT Assessment Patient needs continued PT services  PT Problem List Decreased mobility;Pain;Decreased balance       PT Treatment Interventions Gait training;Therapeutic exercise;Functional mobility training    PT Goals (Current goals can be found in the Care Plan section)  Acute Rehab PT Goals Patient Stated Goal: gardening, being active PT Goal Formulation: With patient Time For Goal Achievement: 07/06/20 Potential to Achieve Goals: Good    Frequency Min 3X/week   Barriers to discharge        Co-evaluation               AM-PAC PT "6 Clicks" Mobility  Outcome Measure Help needed turning from your back to your side while in a flat bed without using bedrails?: A Little Help needed moving from lying on your back to sitting on the side of a flat bed without using bedrails?: A Little Help needed moving to and from a bed to a chair (including a wheelchair)?: A Little Help needed standing up from a chair using your arms (e.g., wheelchair or bedside chair)?: A Little Help needed to walk in hospital room?: None Help needed climbing 3-5 steps with a railing? : A Little 6 Click Score: 19    End of Session Equipment Utilized During Treatment: Gait belt Activity Tolerance: Patient tolerated treatment well Patient left: in bed;with call bell/phone within reach;with family/visitor present Nurse  Communication: Mobility status PT Visit Diagnosis: Difficulty in walking, not elsewhere classified (R26.2);Unsteadiness on feet (R26.81);Pain    Time: 5056-9794 PT Time Calculation (min) (ACUTE ONLY): 30 min   Charges:   PT Evaluation $PT Eval Low Complexity: 1 Low PT Treatments $Gait Training: 8-22 mins        Blondell Reveal Kistler PT 06/22/2020  Acute Rehabilitation Services Pager [phone] Office [phone]

## 2020-06-22 NOTE — ED Notes (Signed)
Resting quietly. No distress noted. Denies any pain or discomfort. Awaiting bed assignment.

## 2020-06-22 NOTE — Progress Notes (Signed)
Bilateral lower extremity venous duplex has been completed. Preliminary results can be found in CV Proc through chart review.   06/22/20 4:18 PM Tara Potts RVT

## 2020-06-23 ENCOUNTER — Inpatient Hospital Stay: Payer: Medicare HMO | Admitting: Internal Medicine

## 2020-06-23 DIAGNOSIS — N179 Acute kidney failure, unspecified: Secondary | ICD-10-CM | POA: Diagnosis not present

## 2020-06-23 DIAGNOSIS — E871 Hypo-osmolality and hyponatremia: Secondary | ICD-10-CM | POA: Diagnosis not present

## 2020-06-23 DIAGNOSIS — R269 Unspecified abnormalities of gait and mobility: Secondary | ICD-10-CM | POA: Diagnosis not present

## 2020-06-23 DIAGNOSIS — E860 Dehydration: Secondary | ICD-10-CM | POA: Diagnosis not present

## 2020-06-23 DIAGNOSIS — C9000 Multiple myeloma not having achieved remission: Secondary | ICD-10-CM | POA: Diagnosis not present

## 2020-06-23 DIAGNOSIS — N1831 Chronic kidney disease, stage 3a: Secondary | ICD-10-CM | POA: Diagnosis not present

## 2020-06-23 DIAGNOSIS — D649 Anemia, unspecified: Secondary | ICD-10-CM | POA: Diagnosis not present

## 2020-06-23 DIAGNOSIS — N390 Urinary tract infection, site not specified: Secondary | ICD-10-CM | POA: Diagnosis not present

## 2020-06-23 DIAGNOSIS — N170 Acute kidney failure with tubular necrosis: Secondary | ICD-10-CM | POA: Diagnosis not present

## 2020-06-23 DIAGNOSIS — Z951 Presence of aortocoronary bypass graft: Secondary | ICD-10-CM | POA: Diagnosis not present

## 2020-06-23 LAB — URINALYSIS, ROUTINE W REFLEX MICROSCOPIC
Bilirubin Urine: NEGATIVE
Glucose, UA: NEGATIVE mg/dL
Hgb urine dipstick: NEGATIVE
Ketones, ur: NEGATIVE mg/dL
Nitrite: NEGATIVE
Protein, ur: NEGATIVE mg/dL
Specific Gravity, Urine: 1.014 (ref 1.005–1.030)
pH: 5 (ref 5.0–8.0)

## 2020-06-23 LAB — BASIC METABOLIC PANEL
Anion gap: 9 (ref 5–15)
BUN: 36 mg/dL — ABNORMAL HIGH (ref 8–23)
CO2: 19 mmol/L — ABNORMAL LOW (ref 22–32)
Calcium: 8.6 mg/dL — ABNORMAL LOW (ref 8.9–10.3)
Chloride: 111 mmol/L (ref 98–111)
Creatinine, Ser: 1.66 mg/dL — ABNORMAL HIGH (ref 0.44–1.00)
GFR, Estimated: 30 mL/min — ABNORMAL LOW (ref >60)
Glucose, Bld: 86 mg/dL (ref 70–99)
Potassium: 4.6 mmol/L (ref 3.5–5.1)
Sodium: 139 mmol/L (ref 135–145)

## 2020-06-23 LAB — URINE CULTURE: Culture: >=100000 — AB

## 2020-06-23 LAB — IRON AND TIBC
Iron: 144 ug/dL (ref 28–170)
Saturation Ratios: 52 % — ABNORMAL HIGH (ref 10.4–31.8)
TIBC: 276 ug/dL (ref 250–450)
UIBC: 132 ug/dL

## 2020-06-23 LAB — VITAMIN B12: Vitamin B-12: 1212 pg/mL — ABNORMAL HIGH (ref 180–914)

## 2020-06-23 LAB — FOLATE: Folate: 8.9 ng/mL (ref >5.9)

## 2020-06-23 LAB — TSH: TSH: 1.288 u[IU]/mL (ref 0.350–4.500)

## 2020-06-23 LAB — FERRITIN: Ferritin: 136 ng/mL (ref 11–307)

## 2020-06-23 MED ORDER — SENNOSIDES-DOCUSATE SODIUM 8.6-50 MG PO TABS
1.0000 | ORAL_TABLET | Freq: Two times a day (BID) | ORAL | Status: DC
  Administered 2020-06-23 – 2020-06-24 (×3): 1 via ORAL
  Filled 2020-06-23 (×3): qty 1

## 2020-06-23 MED ORDER — HYDRALAZINE HCL 25 MG PO TABS
25.0000 mg | ORAL_TABLET | Freq: Two times a day (BID) | ORAL | Status: DC
  Administered 2020-06-23 – 2020-06-24 (×3): 25 mg via ORAL
  Filled 2020-06-23 (×3): qty 1

## 2020-06-23 MED ORDER — METOPROLOL TARTRATE 50 MG PO TABS
50.0000 mg | ORAL_TABLET | Freq: Two times a day (BID) | ORAL | Status: DC
  Administered 2020-06-23 – 2020-06-24 (×2): 50 mg via ORAL
  Filled 2020-06-23 (×2): qty 1

## 2020-06-23 NOTE — Progress Notes (Signed)
PROGRESS NOTE    Tara Potts  BDH:[id number] DOB: 1934/06/30 DOA: 06/21/2020 PCP: [name], MD    Chief Complaint  Patient presents with  . Shortness of Breath    Brief Narrative:  Per HPI: Tara Potts is a 86 y.o. female with medical history significant of coronary artery disease, carotid artery disease, multiple myeloma, anemia of chronic disease, chronic kidney disease stage III, history of coronary artery bypass grafting, hyperlipidemia, hypertension, peripheral neuropathy among other things who came to the ER from her doctor's office where she went with complaint of generalized weakness and poor oral intake in the last few days.  Patient was evaluated.  Found to be dehydrated.  She also has worsening renal function.  Baseline creatinine around 1 but now 2.8.  Subjective:  Remain weak,  Appetite improved some, denies pain, denies urinary symptom No bm since monday Daughter at bedside  Assessment & Plan:   Principal Problem:   Acute renal failure superimposed on stage 3 chronic kidney disease ([place]) Active Problems:   Dyslipidemia, goal LDL below 70   Hx of CABG   Carotid artery disease (HCC)   Multiple myeloma (HCC)   Acute lower UTI   Dehydration   Hyponatremia   AKI on CKD 3B, metabolic acidosis, azotemia -Patient reports she had bilateral lower extremity edema was started on spironolactone 2 weeks ago, edema has improved, but she started to have poor appetite -UA cloudy with large leukocyte few bacteria, urine culture >100,000 multiple species recommend recollection,  -Creatinine improving, urine remains elevated, continue IV hydration and antibiotics , recollect urine -spep/upep in process due to h/o multiple myeloma, does has progressive anemia, calcium unremarkable -renal ultrasound unremarkable -Continue hold Lasix, spironolactone, continue hold losartan HCTZ  Hyponatremia Sodium 132 on presentation, normalized on hydration  History of multiple myeloma  under observation Followed by Dr. Julien Nordmann Check spep/upep  Normocytic anemia -Does appear to have progressive decrease of hemoglobin -No overt sign of external bleed -Check spep/upep -iron panel,b12, folate, tsh unremarkable - check FOBT  Bilateral lower extremity edema  venous doppler no DVT Elevated legs  Hypertension Resume home dose Lopressor , resume hydralazine, continue hold losartan HCTZ ,  Lasix, spironolactone.  CAD, history of carotid artery disease Continue aspirin 81 mg daily, Lipitor 80 mg daily, Zetia, Lopressor  liberalize diet for now due to poor oral intake, change to heart healthy diet once oral intake improves   Anorexia, weight loss of 15 pounds, generalized weakness, PT recommended home health     DVT prophylaxis: heparin injection 5,000 Units Start: 06/21/20 2200   Code Status: Full Family Communication: Daughter at bedside Disposition:    Dispo: The patient is from: Home              Anticipated d/c is to: Home with home health              Anticipated d/c date is: Likely tomorrow                 Consultants:   None  Procedures:   None  Antimicrobials:   Rocephin     Objective: Vitals:   06/22/20 2239 06/23/20 0541 06/23/20 1039 06/23/20 1417  BP: (!) 157/95 (!) 179/92 (!) 137/93 (!) 152/50  Pulse: 78 79 88 61  Resp: _0 Temp: 97.9 F (36.6 C) 98.1 F (36.7 C)  98 F (36.7 C)  TempSrc: Oral Oral  Oral  SpO2: 98% 100%  100%  Weight:  Height:        Intake/Output Summary (Last 24 hours) at 06/23/2020 1614 Last data filed at 06/23/2020 0815 Gross per 24 hour  Intake 1050.68 ml  Output 1950 ml  Net -899.32 ml   Filed Weights   06/22/20 1601  Weight: 87.5 kg    Examination:  General exam: Weak, and frail ,calm, NAD Respiratory system: Clear to auscultation. Respiratory effort normal. Cardiovascular system: S1 & S2 heard, RRR. No JVD, no murmur, No pedal edema. Gastrointestinal system: Abdomen is  nondistended, soft and nontender.  Normal bowel sounds heard. Central nervous system: Alert and oriented. No focal neurological deficits. Extremities: Generalized weakness, trace bilateral lower extremity edema, left > right Skin: No rashes, lesions or ulcers Psychiatry: Judgement and insight appear normal. Mood & affect appropriate.     Data Reviewed: I have personally reviewed following labs and imaging studies  CBC: Recent Labs  Lab 06/21/20 1958 06/22/20 0500  WBC 7.1 5.8  NEUTROABS 3.6  --   HGB 9.9* 8.7*  HCT 30.4* 25.9*  MCV 82.4 82.0  PLT 159 132*    Basic Metabolic Panel: Recent Labs  Lab 06/21/20 1958 06/22/20 0500 06/23/20 1219  NA 132* 135 139  K 5.1 4.5 4.6  CL 101 108 111  CO2 19* 18* 19*  GLUCOSE 127* 126* 86  BUN 80* 70* 36*  CREATININE 2.82* 2.25* 1.66*  CALCIUM 9.1 8.4* 8.6*    GFR: Estimated Creatinine Clearance: 28.7 mL/min (A) (by C-G formula based on SCr of 1.66 mg/dL (H)).  Liver Function Tests: Recent Labs  Lab 06/21/20 1958 06/22/20 0500  AST 35 32  ALT 32 27  ALKPHOS 69 57  BILITOT 0.9 0.6  PROT 9.1* 7.7  ALBUMIN 3.9 3.2*    CBG: No results for input(s): GLUCAP in the last 168 hours.   Recent Results (from the past 240 hour(s))  Resp Panel by RT-PCR (Flu A&B, Covid) Nasopharyngeal Swab     Status: None   Collection Time: 06/21/20  7:45 PM   Specimen: Nasopharyngeal Swab; Nasopharyngeal(NP) swabs in vial transport medium  Result Value Ref Range Status   SARS Coronavirus 2 by RT PCR NEGATIVE NEGATIVE Final    Comment: (NOTE) SARS-CoV-2 target nucleic acids are NOT DETECTED.  The SARS-CoV-2 RNA is generally detectable in upper respiratory specimens during the acute phase of infection. The lowest concentration of SARS-CoV-2 viral copies this assay can detect is 138 copies/mL. A negative result does not preclude SARS-Cov-2 infection and should not be used as the sole basis for treatment or other patient management  decisions. A negative result may occur with  improper specimen collection/handling, submission of specimen other than nasopharyngeal swab, presence of viral mutation(s) within the areas targeted by this assay, and inadequate number of viral copies(<138 copies/mL). A negative result must be combined with clinical observations, patient history, and epidemiological information. The expected result is Negative.  Fact Sheet for Patients:  EntrepreneurPulse.com.au  Fact Sheet for Healthcare Providers:  IncredibleEmployment.be  This test is no t yet approved or cleared by the [place] FDA and  has been authorized for detection and/or diagnosis of SARS-CoV-2 by FDA under an Emergency Use Authorization (EUA). This EUA will remain  in effect (meaning this test can be used) for the duration of the COVID-19 declaration under Section 564(b)(1) of the Act, 21 U.S.C.section 360bbb-3(b)(1), unless the authorization is terminated  or revoked sooner.       Influenza A by PCR NEGATIVE NEGATIVE Final   Influenza B by  PCR NEGATIVE NEGATIVE Final    Comment: (NOTE) The Xpert Xpress SARS-CoV-2/FLU/RSV plus assay is intended as an aid in the diagnosis of influenza from Nasopharyngeal swab specimens and should not be used as a sole basis for treatment. Nasal washings and aspirates are unacceptable for Xpert Xpress SARS-CoV-2/FLU/RSV testing.  Fact Sheet for Patients: EntrepreneurPulse.com.au  Fact Sheet for Healthcare Providers: IncredibleEmployment.be  This test is not yet approved or cleared by the [place] FDA and has been authorized for detection and/or diagnosis of SARS-CoV-2 by FDA under an Emergency Use Authorization (EUA). This EUA will remain in effect (meaning this test can be used) for the duration of the COVID-19 declaration under Section 564(b)(1) of the Act, 21 U.S.C. section 360bbb-3(b)(1), unless the  authorization is terminated or revoked.  Performed at [facility], [place] [address] [place], [place] [address]   Urine culture     Status: Abnormal   Collection Time: 06/21/20  8:44 PM   Specimen: Urine, Clean Catch  Result Value Ref Range Status   Specimen Description   Final    URINE, CLEAN CATCH Performed at [facility], [place] [address], [place], [place] [address]    Special Requests   Final    NONE Performed at [facility], [place] [address] [place], [place] [address]    Culture (A)  Final    >=100,000 COLONIES/mL MULTIPLE SPECIES PRESENT, SUGGEST RECOLLECTION   Report Status 06/23/2020 FINAL  Final  MRSA PCR Screening     Status: None   Collection Time: 06/22/20  1:05 PM   Specimen: Nasal Mucosa; Nasopharyngeal  Result Value Ref Range Status   MRSA by PCR NEGATIVE NEGATIVE Final    Comment:        The GeneXpert MRSA Assay (FDA approved for NASAL specimens only), is one component of a comprehensive MRSA colonization surveillance program. It is not intended to diagnose MRSA infection nor to guide or monitor treatment for MRSA infections. Performed at [facility], [place] [address] [place], [place] [address]          Radiology Studies: US RENAL  Result Date: 06/22/2020 CLINICAL DATA:  Elevated creatinine. EXAM: RENAL / URINARY TRACT ULTRASOUND COMPLETE COMPARISON:  Ultrasound 08/22/2018 FINDINGS: Right Kidney: Renal measurements: 8.0 x 4.4 x 4.9 cm = volume: 92.0 mL. Normal renal cortical thickness and echogenicity for age. No worrisome renal lesions, renal calculi or hydronephrosis. Left Kidney: Renal measurements: 8.5 x 4.2 x 5.0 cm = volume: 93.0 mL. Normal renal cortical thickness and echogenicity for age. No worrisome renal lesions, renal calculi or hydronephrosis. Bladder: Normal Other: None. IMPRESSION: Unremarkable renal ultrasound examination. Electronically Signed   By: Marijo Sanes  M.D.   On: 06/22/2020 09:41   VAS [place] LOWER EXTREMITY VENOUS (DVT)  Result Date: 06/22/2020  Lower Venous DVT Study Indications: Edema.  Risk Factors: None identified. Limitations: Poor ultrasound/tissue interface. Comparison Study: No prior studies. Performing Technologist: Oliver Hum RVT  Examination Guidelines: A complete evaluation includes B-mode imaging, spectral Doppler, color Doppler, and power Doppler as needed of all accessible portions of each vessel. Bilateral testing is considered an integral part of a complete examination. Limited examinations for reoccurring indications may be performed as noted. The reflux portion of the exam is performed with the patient in reverse Trendelenburg.  +---------+---------------+---------+-----------+----------+--------------+ RIGHT    CompressibilityPhasicitySpontaneityPropertiesThrombus Aging +---------+---------------+---------+-----------+----------+--------------+ CFV      Full           Yes  Yes                                 +---------+---------------+---------+-----------+----------+--------------+ SFJ      Full                                                        +---------+---------------+---------+-----------+----------+--------------+ FV Prox  Full                                                        +---------+---------------+---------+-----------+----------+--------------+ FV Mid   Full                                                        +---------+---------------+---------+-----------+----------+--------------+ FV DistalFull                                                        +---------+---------------+---------+-----------+----------+--------------+ PFV      Full                                                        +---------+---------------+---------+-----------+----------+--------------+ POP      Full           Yes      Yes                                  +---------+---------------+---------+-----------+----------+--------------+ PTV      Full                                                        +---------+---------------+---------+-----------+----------+--------------+ PERO     Full                                                        +---------+---------------+---------+-----------+----------+--------------+   +---------+---------------+---------+-----------+----------+--------------+ LEFT     CompressibilityPhasicitySpontaneityPropertiesThrombus Aging +---------+---------------+---------+-----------+----------+--------------+ CFV      Full           Yes      Yes                                 +---------+---------------+---------+-----------+----------+--------------+ SFJ      Full                                                        +---------+---------------+---------+-----------+----------+--------------+  FV Prox  Full                                                        +---------+---------------+---------+-----------+----------+--------------+ FV Mid   Full                                                        +---------+---------------+---------+-----------+----------+--------------+ FV DistalFull                                                        +---------+---------------+---------+-----------+----------+--------------+ PFV      Full                                                        +---------+---------------+---------+-----------+----------+--------------+ POP      Full           Yes      Yes                                 +---------+---------------+---------+-----------+----------+--------------+ PTV      Full                                                        +---------+---------------+---------+-----------+----------+--------------+ PERO     Full                                                         +---------+---------------+---------+-----------+----------+--------------+     Summary: RIGHT: - There is no evidence of deep vein thrombosis in the lower extremity.  - No cystic structure found in the popliteal fossa.  LEFT: - There is no evidence of deep vein thrombosis in the lower extremity.  - No cystic structure found in the popliteal fossa.  *See table(s) above for measurements and observations. Electronically signed by Servando Snare MD on 06/22/2020 at 4:52:59 PM.    Final         Scheduled Meds: . heparin  5,000 Units Subcutaneous Q8H  . hydrALAZINE  25 mg Oral BID  . metoprolol tartrate  50 mg Oral BID  . senna-docusate  1 tablet Oral BID  . sodium bicarbonate  650 mg Oral BID   Continuous Infusions: . sodium chloride 75 mL/hr at 06/23/20 0555  . cefTRIAXone (ROCEPHIN)  IV Stopped (06/22/20 2154)     LOS: 0 days   Time spent: 24mns Greater than 50% of this time was spent in counseling, explanation of diagnosis, planning  of further management, and coordination of care.  I have personally reviewed and interpreted on  06/23/2020 daily labs, imagings as discussed above under date review session and assessment and plans.  I reviewed all nursing notes, pharmacy notes, consultant notes,  vitals, pertinent old records  I have discussed plan of care as described above with RN , patient and family on 06/23/2020  Voice Recognition /Dragon dictation system was used to create this note, attempts have been made to correct errors. Please contact the author with questions and/or clarifications.   Florencia Reasons, MD PhD FACP [facility]  Available via Epic secure chat 7am-7pm for nonurgent issues Please page for urgent issues To page the attending provider between 7A-7P or the covering provider during after hours 7P-7A, please log into the web site [email or web address] and access using universal [place] password for that web site. If you do not have the password, please call the hospital  operator.    06/23/2020, 4:14 PM

## 2020-06-23 NOTE — TOC Initial Note (Signed)
Transition of Care [facility]) - Initial/Assessment Note    Patient Details  Name: Tara Potts MRN: [id number] Date of Birth: 12-14-1933  [facility] of Care [facility]) CM/SW Contact:    Maryfer Tauzin, Marjie Skiff, RN Phone Number: 06/23/2020, 1:46 PM           Barriers to Discharge: Continued Medical Work up   Patient Goals and CMS Choice Patient states their goals for this hospitalization and ongoing recovery are:: To get home CMS Medicare.gov Compare Post Acute Care list provided to:: Patient Choice offered to / list presented to : Patient  Expected Discharge Plan and Services     Discharge Planning Services: CM Consult Post Acute Care Choice: [place] arrangements for the past 2 months: Single Family Home                 DME Arranged: Walker rolling DME Agency: [employer] Date DME Agency Contacted: 06/23/20 Time DME Agency Contacted: 6   [employer] Agency: [place] Date [facility] Agency Contacted: 06/23/20 Time [place]: 1343 Representative spoke with at [place]: Tommi Rumps  Prior Living Arrangements/Services Living arrangements for the past 2 months: [place] with:: Self, children Patient language and need for interpreter reviewed:: Yes Do you feel safe going back to the place where you live?: Yes      Need for Family Participation in Patient Care: Yes (Comment) Care giver support system in place?: Yes (comment)   Criminal Activity/Legal Involvement Pertinent to Current Situation/Hospitalization: No - Comment as needed  Activities of Daily Living Home Assistive Devices/Equipment: Bedside commode/3-in-1,Cane (specify quad or straight),Dentures (specify type),Walker (specify type) (front wheeled walker, single point cane, elevator access, upper/lower dentures) ADL Screening (condition at time of admission) Patient's cognitive ability adequate to safely complete daily activities?: Yes Is the patient deaf or have difficulty  hearing?: No Does the patient have difficulty seeing, even when wearing glasses/contacts?: No Does the patient have difficulty concentrating, remembering, or making decisions?: No Patient able to express need for assistance with ADLs?: Yes Does the patient have difficulty dressing or bathing?: No Independently performs ADLs?: No Communication: Independent Dressing (OT): Independent Grooming: Independent Feeding: Independent Bathing: Independent Toileting: Needs assistance Is this a change from baseline?: Change from baseline, expected to last >3days In/Out Bed: Needs assistance Is this a change from baseline?: Change from baseline, expected to last >3 days Walks in Home: Needs assistance Is this a change from baseline?: Change from baseline, expected to last >3 days Does the patient have difficulty walking or climbing stairs?: Yes (secondary to shortness of breath and weakness) Weakness of Legs: Both Weakness of Arms/Hands: None  Permission Sought/Granted                  Emotional Assessment Appearance:: Appears stated age Attitude/Demeanor/Rapport: Self-Confident Affect (typically observed): Calm Orientation: : Oriented to Self,Oriented to Place,Oriented to  Time,Oriented to Situation Alcohol / Substance Use: Not Applicable    Admission diagnosis:  AKI (acute kidney injury) ([place]) [N17.9] Creatinine elevation [R79.89] Fatigue, unspecified type [R53.83] Acute renal failure superimposed on stage 3 chronic kidney disease, unspecified acute renal failure type, unspecified whether stage 3a or 3b CKD ([place]) [N17.9, N18.30] Patient Active Problem List   Diagnosis Date Noted  . Acute lower UTI 06/21/2020  . Dehydration 06/21/2020  . Acute renal failure superimposed on stage 3 chronic kidney disease ([place]) 06/21/2020  . Hyponatremia 06/21/2020  . Gait abnormality 04/14/2020  . Peripheral neuropathy 04/14/2020  . CRI (chronic  renal insufficiency), stage 3 (moderate) ([place])  11/20/2019  . Bilateral lower extremity edema 10/23/2019  . Multiple myeloma ([place]) 05/26/2018  . Goals of care, counseling/discussion 05/26/2018  . Obese 01/29/2018  . S/P right THA, AA 01/28/2018  . S/P hip replacement 01/28/2018  . Carotid artery disease ([place]) 04/12/2017  . Passed out 10/05/2015  . Lumbosacral radiculopathy 09/10/2014  . Cervical stenosis of spinal canal 09/10/2014  . Abnormality of gait 07/27/2014  . Paresthesia 07/27/2014  . Bilateral leg cramps 02/21/2014  . Hx of CABG 01/13/2013  . Peripheral vascular disease:  Moderate right and mild left ICA stenosis. 01/13/2013  . Essential hypertension 01/13/2013  . Dyslipidemia, goal LDL below 70   . Menopause 02/12/2011  . Leiomyoma of body of uterus 02/12/2011  . Post-menopausal atrophic vaginitis 02/12/2011  . Urinary frequency 02/12/2011  . UNSPECIFIED PERIPHERAL VASCULAR DISEASE 07/11/2010   PCP:  [name], MD Pharmacy:   [id number] [place] [address]Phone: [phone]: [id number]- [address] [place] SM[facility] Center[facility] [address] [place] [address]Phone: [phone]: [phone]    Social Determinants of Health ([place] Interventions    Readmission Risk Interventions No flowsheet data found.

## 2020-06-23 NOTE — Care Management Obs Status (Signed)
[place] NOTIFICATION   Patient Details  Name: NICCOLE WITTHUHN MRN: [id number] Date of Birth: 18-Feb-1934   Medicare Observation Status Notification Given:  Yes    Lynnell Catalan, RN 06/23/2020, 2:38 PM

## 2020-06-24 DIAGNOSIS — N390 Urinary tract infection, site not specified: Secondary | ICD-10-CM | POA: Diagnosis not present

## 2020-06-24 DIAGNOSIS — E860 Dehydration: Secondary | ICD-10-CM | POA: Diagnosis not present

## 2020-06-24 DIAGNOSIS — N1831 Chronic kidney disease, stage 3a: Secondary | ICD-10-CM | POA: Diagnosis not present

## 2020-06-24 DIAGNOSIS — Z951 Presence of aortocoronary bypass graft: Secondary | ICD-10-CM | POA: Diagnosis not present

## 2020-06-24 DIAGNOSIS — C9000 Multiple myeloma not having achieved remission: Secondary | ICD-10-CM | POA: Diagnosis not present

## 2020-06-24 DIAGNOSIS — N183 Chronic kidney disease, stage 3 unspecified: Secondary | ICD-10-CM

## 2020-06-24 DIAGNOSIS — D649 Anemia, unspecified: Secondary | ICD-10-CM | POA: Diagnosis not present

## 2020-06-24 DIAGNOSIS — N179 Acute kidney failure, unspecified: Secondary | ICD-10-CM | POA: Diagnosis not present

## 2020-06-24 DIAGNOSIS — N170 Acute kidney failure with tubular necrosis: Secondary | ICD-10-CM | POA: Diagnosis not present

## 2020-06-24 DIAGNOSIS — E871 Hypo-osmolality and hyponatremia: Secondary | ICD-10-CM | POA: Diagnosis not present

## 2020-06-24 LAB — BASIC METABOLIC PANEL
Anion gap: 9 (ref 5–15)
BUN: 29 mg/dL — ABNORMAL HIGH (ref 8–23)
CO2: 20 mmol/L — ABNORMAL LOW (ref 22–32)
Calcium: 8.2 mg/dL — ABNORMAL LOW (ref 8.9–10.3)
Chloride: 111 mmol/L (ref 98–111)
Creatinine, Ser: 1.4 mg/dL — ABNORMAL HIGH (ref 0.44–1.00)
GFR, Estimated: 37 mL/min — ABNORMAL LOW (ref >60)
Glucose, Bld: 86 mg/dL (ref 70–99)
Potassium: 4.5 mmol/L (ref 3.5–5.1)
Sodium: 140 mmol/L (ref 135–145)

## 2020-06-24 LAB — CBC WITH DIFFERENTIAL/PLATELET
Abs Immature Granulocytes: 0.03 10*3/uL (ref 0.00–0.07)
Basophils Absolute: 0 10*3/uL (ref 0.0–0.1)
Basophils Relative: 0 %
Eosinophils Absolute: 0.2 10*3/uL (ref 0.0–0.5)
Eosinophils Relative: 4 %
HCT: 27.1 % — ABNORMAL LOW (ref 36.0–46.0)
Hemoglobin: 8.6 g/dL — ABNORMAL LOW (ref 12.0–15.0)
Immature Granulocytes: 1 %
Lymphocytes Relative: 45 %
Lymphs Abs: 2.6 10*3/uL (ref 0.7–4.0)
MCH: 26.8 pg (ref 26.0–34.0)
MCHC: 31.7 g/dL (ref 30.0–36.0)
MCV: 84.4 fL (ref 80.0–100.0)
Monocytes Absolute: 0.7 10*3/uL (ref 0.1–1.0)
Monocytes Relative: 13 %
Neutro Abs: 2.1 10*3/uL (ref 1.7–7.7)
Neutrophils Relative %: 37 %
Platelets: 131 10*3/uL — ABNORMAL LOW (ref 150–400)
RBC: 3.21 MIL/uL — ABNORMAL LOW (ref 3.87–5.11)
RDW: 17.7 % — ABNORMAL HIGH (ref 11.5–15.5)
WBC: 5.7 10*3/uL (ref 4.0–10.5)
nRBC: 0 % (ref 0.0–0.2)

## 2020-06-24 LAB — PROTEIN ELECTROPHORESIS, SERUM
A/G Ratio: 0.7 (ref 0.7–1.7)
Albumin ELP: 3.2 g/dL (ref 2.9–4.4)
Alpha-1-Globulin: 0.2 g/dL (ref 0.0–0.4)
Alpha-2-Globulin: 0.9 g/dL (ref 0.4–1.0)
Beta Globulin: 3.1 g/dL — ABNORMAL HIGH (ref 0.7–1.3)
Gamma Globulin: 0.1 g/dL — ABNORMAL LOW (ref 0.4–1.8)
Globulin, Total: 4.3 g/dL — ABNORMAL HIGH (ref 2.2–3.9)
M-Spike, %: 2.6 g/dL — ABNORMAL HIGH
Total Protein ELP: 7.5 g/dL (ref 6.0–8.5)

## 2020-06-24 LAB — URINE CULTURE: Culture: NO GROWTH

## 2020-06-24 MED ORDER — HYDRALAZINE HCL 25 MG PO TABS
25.0000 mg | ORAL_TABLET | Freq: Three times a day (TID) | ORAL | 5 refills | Status: DC
Start: 2020-06-24 — End: 2020-07-04

## 2020-06-24 MED ORDER — METOPROLOL TARTRATE 25 MG PO TABS: 75.0000 mg | ORAL_TABLET | Freq: Two times a day (BID) | ORAL | 0 refills | Status: DC

## 2020-06-24 MED ORDER — POLYETHYLENE GLYCOL 3350 17 G PO PACK
17.0000 g | PACK | Freq: Every day | ORAL | Status: DC
  Administered 2020-06-24: 17 g via ORAL
  Filled 2020-06-24: qty 1

## 2020-06-24 MED ORDER — SODIUM BICARBONATE 650 MG PO TABS: 650.0000 mg | ORAL_TABLET | Freq: Two times a day (BID) | ORAL | 0 refills | Status: AC

## 2020-06-24 MED ORDER — SENNOSIDES-DOCUSATE SODIUM 8.6-50 MG PO TABS: 1.0000 | ORAL_TABLET | Freq: Every day | ORAL | 0 refills | Status: DC

## 2020-06-24 MED ORDER — FUROSEMIDE 40 MG PO TABS: 40.0000 mg | ORAL_TABLET | Freq: Two times a day (BID) | ORAL | 3 refills | Status: DC

## 2020-06-24 NOTE — Discharge Summary (Signed)
Discharge Summary  Tara Potts KPT:[id number] DOB: 08/12/1933  PCP: Tara Pretty, MD  Admit date: 06/21/2020 Discharge date: 06/24/2020  Time spent: 66mns, more than 50% time spent on coordination of care.   Recommendations for Outpatient Follow-up:  1. F/u with PCP within a week  for hospital discharge follow up, repeat cbc/bmp at follow up, pcp to follow up on repeat urine culture result 2. F/u with hematology oncology Dr Tara Nordmannin one week, message sent to Dr Tara Nordmannto follow up on SPEP/UPEP result 3. Home health arranged  Discharge Diagnoses:  Active Hospital Problems   Diagnosis Date Noted  . Acute renal failure superimposed on stage 3 chronic kidney disease ([place] 06/21/2020  . Acute lower UTI 06/21/2020  . Dehydration 06/21/2020  . Hyponatremia 06/21/2020  . Multiple myeloma ([place] 05/26/2018  . Carotid artery disease ([place] 04/12/2017  . Hx of CABG 01/13/2013  . Dyslipidemia, goal LDL below 70     Resolved Hospital Problems  No resolved problems to display.    Discharge Condition: stable  Diet recommendation: heart healthy  Filed Weights   06/22/20 1601 06/24/20 0510  Weight: 87.5 kg 90.2 kg    History of present illness: ( per admitting MD Dr GJonelle Sidle Chief Complaint: Generalized weakness  HPI: RDENECIA BRUNETTEis a 86y.o. female with medical history significant of coronary artery disease, carotid artery disease, multiple myeloma, anemia of chronic disease, chronic kidney disease stage III, history of coronary artery bypass grafting, hyperlipidemia, hypertension, peripheral neuropathy among other things who came to the ER from her doctor's office where she went with complaint of generalized weakness and poor oral intake in the last few days.  Patient was evaluated.  Found to be dehydrated.  She also has worsening renal function.  Baseline creatinine around 1 but now 2.8.  Denied any significant symptoms except for the weakness no fever no chills no sick contacts.   Patient seen in the ER and evaluated.  She appears to have acute on chronic kidney disease.  Also possible UTI although she is unable to give symptoms except for frequency and urinary incontinence.  Patient being admitted to the hospital for evaluation of her acute on chronic renal failure suspected to be prerenal..  ED Course: Temperature 97.8 blood pressure 154/52 pulse 87 respiratory rate of 18 oxygen sat 100% room air.  White count 7.1, hemoglobin 9.9 and platelets 159.  Sodium 132 potassium 5.0 chloride 101 CO2 19 BUN 18 and creatinine 2.82 calcium 9.1 glucose 127.  Respiratory viral including Covid negative urinalysis showed cloudy urine with WBC 21-50 with few bacteria and leukocytes positive.  EKG shows normal sinus rhythm.  Chest x-ray showed no acute findings.  Patient being admitted with acute on chronic kidney disease secondary to poor oral intake and possible UTI  Hospital Course:  Principal Problem:   Acute renal failure superimposed on stage 3 chronic kidney disease (HCC) Active Problems:   Dyslipidemia, goal LDL below 70   Hx of CABG   Carotid artery disease (HCC)   Multiple myeloma (HCC)   Acute lower UTI   Dehydration   Hyponatremia   AKI on CKD 3B, metabolic acidosis, azotemia -Patient reports she had bilateral lower extremity edema was started on spironolactone 2 weeks ago, edema has improved, but she started to have poor appetite and feeling weak -UA cloudy with large leukocyte few bacteria, urine culture >100,000 multiple species recommend recollection,  repeat urine culture result pending at discharge --spep/upep in process due to h/o multiple myeloma,  does has progressive anemia, calcium unremarkable -renal ultrasound unremarkable - hold Lasix, spironolactone and  Losartan in the hospital - she received IV hydration and antibiotics rocephin -cr back to baseline at discharge, resumed lasix at discharge, discontinue losartan and spironolactone -f/u with pcp    Hyponatremia Sodium 132 on presentation, normalized on hydration  History of multiple myeloma under observation  spep/upep result pending Message sent to hematology/oncology Dr Julien Potts, patient is to follow up with Dr Julien Potts  Normocytic anemia -Does appear to have progressive decrease of hemoglobin -No overt sign of external bleed -spep/upep in process -iron panel,b12, folate, tsh unremarkable   Bilateral lower extremity edema  venous doppler no DVT Elevated legs  Hypertension Increase  Lopressor  And  hydralazine, continue lasix,  discontinue losartan  Due to CKD Patient is advised to check blood pressure at home, bring in record for pcp to review  CAD, history of carotid artery disease Stable, no active issues Continue aspirin 81 mg daily, Lipitor 80 mg daily, Zetia, Lopressor   Anorexia, weight loss of 15 pounds, generalized weakness, PT recommended home health Follow up with pcp  Body mass index is 31.15 kg/m.      DVT prophylaxis while in the hospital: heparin injection 5,000 Units Start: 06/21/20 2200   Code Status: Full Family Communication: Daughter at bedside Disposition: home with home health     Consultants:   None  Procedures:   None  Antimicrobials:   Rocephin   Discharge Exam: BP (!) 161/51 (BP Location: Left Arm)   Pulse 78   Temp 98.1 F (36.7 C) (Oral)   Resp 16   Ht '5\' 7"'  (1.702 m)   Wt 90.2 kg   LMP 07/16/2002   SpO2 99%   BMI 31.15 kg/m   General: NAD Cardiovascular: RRR Respiratory: CTABL  Discharge Instructions You were cared for by a hospitalist during your hospital stay. If you have any questions about your discharge medications or the care you received while you were in the hospital after you are discharged, you can call the unit and asked to speak with the hospitalist on call if the hospitalist that took care of you is not available. Once you are discharged, your primary care  physician will handle any further medical issues. Please note that NO REFILLS for any discharge medications will be authorized once you are discharged, as it is imperative that you return to your primary care physician (or establish a relationship with a primary care physician if you do not have one) for your aftercare needs so that they can reassess your need for medications and monitor your lab values.  Discharge Instructions    Diet - low sodium heart healthy   Complete by: As directed    Increase activity slowly   Complete by: As directed      Allergies as of 06/24/2020      Reactions   Codeine Nausea And Vomiting   confusion   Cymbalta [duloxetine Hcl] Nausea Only      Medication List    STOP taking these medications   losartan 100 MG tablet Commonly known as: COZAAR     TAKE these medications   aspirin EC 81 MG tablet Take 81 mg by mouth daily.   atorvastatin 80 MG tablet Commonly known as: LIPITOR Take 80 mg by mouth daily.   CALCIUM MAGNESIUM PO Take 1 tablet by mouth daily.   COQ10 PO Take 1 capsule by mouth daily.   furosemide 40 MG tablet Commonly known as:  LASIX Take 1 tablet (40 mg total) by mouth 2 (two) times daily. Please weight yourself daily, check your legs daily, if your weight go up by 3lbs in three days and legs are swollen, take lasix 71m  in the morning and call your doctor What changed: additional instructions   GARLIC PO Take 1 tablet by mouth 2 (two) times daily.   GLUCOSAMINE PO Take 1 tablet by mouth daily.   hydrALAZINE 25 MG tablet Commonly known as: APRESOLINE Take 1 tablet (25 mg total) by mouth 3 (three) times daily. What changed: when to take this   metoprolol tartrate 25 MG tablet Commonly known as: LOPRESSOR Take 3 tablets (75 mg total) by mouth 2 (two) times daily. What changed:   medication strength  See the new instructions.   potassium chloride 10 MEQ tablet Commonly known as: KLOR-CON TAKE 1 TABLET(10 MEQ) BY  MOUTH DAILY What changed: See the new instructions.   senna-docusate 8.6-50 MG tablet Commonly known as: Senokot-S Take 1 tablet by mouth at bedtime.   sodium bicarbonate 650 MG tablet Take 1 tablet (650 mg total) by mouth 2 (two) times daily for 3 days.   VITAMIN C PO Take 1 tablet by mouth daily.   Vitamin D 50 MCG (2000 UT) tablet Take 2,000 Units by mouth daily.            Durable Medical Equipment  (From admission, onward)         Start     Ordered   06/23/20 1235  For home use only DME Walker rolling  Once       Question Answer Comment  Walker: With 5 Inch Wheels   Patient needs a walker to treat with the following condition FTT (failure to thrive) in adult      06/23/20 1234         Allergies  Allergen Reactions  . Codeine Nausea And Vomiting    confusion  . Cymbalta [Duloxetine Hcl] Nausea Only    Follow-up Information    PDeland Pretty MD Follow up in 1 week(s).   Specialty: Internal Medicine Why: hospital discharge follow up, repeat cbc/bmp at follow up to follow up on anemia and kidney function, also follow up on final urine culture result Contact information: [address] [id number] [phone]        BLorretta Harp MD .   Specialties: Cardiology, Radiology Contact information: [id number] BThe [facility]Follow up.   Specialty: Home Health Services Why: home health physical therapy Contact information: [phone]        MCurt Bears MD. Call in 1 week(s).   Specialty: Oncology Why: for anemia with h/o of multiple myeloma  Contact information: [phone]               The results of significant diagnostics from this hospitalization (including imaging, microbiology, ancillary and laboratory) are listed below for  reference.    Significant Diagnostic Studies: DG Chest 2 View  Result Date: 06/21/2020 CLINICAL DATA:  Shortness of breath EXAM: CHEST - 2 VIEW COMPARISON:  None. FINDINGS: The heart size and mediastinal contours are within normal limits. Both lungs are clear. No pleural effusion or pneumothorax. The visualized skeletal structures are unremarkable. IMPRESSION: No acute process in the chest. Electronically Signed   By: PMalachi Carl  Patel M.D.   On: 06/21/2020 14:18   US RENAL  Result Date: 06/22/2020 CLINICAL DATA:  Elevated creatinine. EXAM: RENAL / URINARY TRACT ULTRASOUND COMPLETE COMPARISON:  Ultrasound 08/22/2018 FINDINGS: Right Kidney: Renal measurements: 8.0 x 4.4 x 4.9 cm = volume: 92.0 mL. Normal renal cortical thickness and echogenicity for age. No worrisome renal lesions, renal calculi or hydronephrosis. Left Kidney: Renal measurements: 8.5 x 4.2 x 5.0 cm = volume: 93.0 mL. Normal renal cortical thickness and echogenicity for age. No worrisome renal lesions, renal calculi or hydronephrosis. Bladder: Normal Other: None. IMPRESSION: Unremarkable renal ultrasound examination. Electronically Signed   By: Marijo Sanes M.D.   On: 06/22/2020 09:41   VAS [place] LOWER EXTREMITY VENOUS (DVT)  Result Date: 06/22/2020  Lower Venous DVT Study Indications: Edema.  Risk Factors: None identified. Limitations: Poor ultrasound/tissue interface. Comparison Study: No prior studies. Performing Technologist: Oliver Hum RVT  Examination Guidelines: A complete evaluation includes B-mode imaging, spectral Doppler, color Doppler, and power Doppler as needed of all accessible portions of each vessel. Bilateral testing is considered an integral part of a complete examination. Limited examinations for reoccurring indications may be performed as noted. The reflux portion of the exam is performed with the patient in reverse Trendelenburg.  +---------+---------------+---------+-----------+----------+--------------+ RIGHT     CompressibilityPhasicitySpontaneityPropertiesThrombus Aging +---------+---------------+---------+-----------+----------+--------------+ CFV      Full           Yes      Yes                                 +---------+---------------+---------+-----------+----------+--------------+ SFJ      Full                                                        +---------+---------------+---------+-----------+----------+--------------+ FV Prox  Full                                                        +---------+---------------+---------+-----------+----------+--------------+ FV Mid   Full                                                        +---------+---------------+---------+-----------+----------+--------------+ FV DistalFull                                                        +---------+---------------+---------+-----------+----------+--------------+ PFV      Full                                                        +---------+---------------+---------+-----------+----------+--------------+ POP      Full  Yes      Yes                                 +---------+---------------+---------+-----------+----------+--------------+ PTV      Full                                                        +---------+---------------+---------+-----------+----------+--------------+ PERO     Full                                                        +---------+---------------+---------+-----------+----------+--------------+   +---------+---------------+---------+-----------+----------+--------------+ LEFT     CompressibilityPhasicitySpontaneityPropertiesThrombus Aging +---------+---------------+---------+-----------+----------+--------------+ CFV      Full           Yes      Yes                                 +---------+---------------+---------+-----------+----------+--------------+ SFJ      Full                                                         +---------+---------------+---------+-----------+----------+--------------+ FV Prox  Full                                                        +---------+---------------+---------+-----------+----------+--------------+ FV Mid   Full                                                        +---------+---------------+---------+-----------+----------+--------------+ FV DistalFull                                                        +---------+---------------+---------+-----------+----------+--------------+ PFV      Full                                                        +---------+---------------+---------+-----------+----------+--------------+ POP      Full           Yes      Yes                                 +---------+---------------+---------+-----------+----------+--------------+ PTV  Full                                                        +---------+---------------+---------+-----------+----------+--------------+ PERO     Full                                                        +---------+---------------+---------+-----------+----------+--------------+     Summary: RIGHT: - There is no evidence of deep vein thrombosis in the lower extremity.  - No cystic structure found in the popliteal fossa.  LEFT: - There is no evidence of deep vein thrombosis in the lower extremity.  - No cystic structure found in the popliteal fossa.  *See table(s) above for measurements and observations. Electronically signed by Servando Snare MD on 06/22/2020 at 4:52:59 PM.    Final     Microbiology: Recent Results (from the past 240 hour(s))  Resp Panel by RT-PCR (Flu A&B, Covid) Nasopharyngeal Swab     Status: None   Collection Time: 06/21/20  7:45 PM   Specimen: Nasopharyngeal Swab; Nasopharyngeal(NP) swabs in vial transport medium  Result Value Ref Range Status   SARS Coronavirus 2 by RT PCR NEGATIVE NEGATIVE Final    Comment:  (NOTE) SARS-CoV-2 target nucleic acids are NOT DETECTED.  The SARS-CoV-2 RNA is generally detectable in upper respiratory specimens during the acute phase of infection. The lowest concentration of SARS-CoV-2 viral copies this assay can detect is 138 copies/mL. A negative result does not preclude SARS-Cov-2 infection and should not be used as the sole basis for treatment or other patient management decisions. A negative result may occur with  improper specimen collection/handling, submission of specimen other than nasopharyngeal swab, presence of viral mutation(s) within the areas targeted by this assay, and inadequate number of viral copies(<138 copies/mL). A negative result must be combined with clinical observations, patient history, and epidemiological information. The expected result is Negative.  Fact Sheet for Patients:  EntrepreneurPulse.com.au  Fact Sheet for Healthcare Providers:  IncredibleEmployment.be  This test is no t yet approved or cleared by the [place] FDA and  has been authorized for detection and/or diagnosis of SARS-CoV-2 by FDA under an Emergency Use Authorization (EUA). This EUA will remain  in effect (meaning this test can be used) for the duration of the COVID-19 declaration under Section 564(b)(1) of the Act, 21 U.S.C.section 360bbb-3(b)(1), unless the authorization is terminated  or revoked sooner.       Influenza A by PCR NEGATIVE NEGATIVE Final   Influenza B by PCR NEGATIVE NEGATIVE Final    Comment: (NOTE) The Xpert Xpress SARS-CoV-2/FLU/RSV plus assay is intended as an aid in the diagnosis of influenza from Nasopharyngeal swab specimens and should not be used as a sole basis for treatment. Nasal washings and aspirates are unacceptable for Xpert Xpress SARS-CoV-2/FLU/RSV testing.  Fact Sheet for Patients: EntrepreneurPulse.com.au  Fact Sheet for Healthcare  Providers: IncredibleEmployment.be  This test is not yet approved or cleared by the [place] FDA and has been authorized for detection and/or diagnosis of SARS-CoV-2 by FDA under an Emergency Use Authorization (EUA). This EUA will remain in effect (meaning this test can be used) for the duration of  the COVID-19 declaration under Section 564(b)(1) of the Act, 21 U.S.C. section 360bbb-3(b)(1), unless the authorization is terminated or revoked.  Performed at [facility], [place] [address], [place], [place] [address]   Urine culture     Status: Abnormal   Collection Time: 06/21/20  8:44 PM   Specimen: Urine, Clean Catch  Result Value Ref Range Status   Specimen Description   Final    URINE, CLEAN CATCH Performed at [facility], [place] [address], [place], [place] [address]    Special Requests   Final    NONE Performed at [facility], [place] [address], [place], [place] [address]    Culture (A)  Final    >=100,000 COLONIES/mL MULTIPLE SPECIES PRESENT, SUGGEST RECOLLECTION   Report Status 06/23/2020 FINAL  Final  MRSA PCR Screening     Status: None   Collection Time: 06/22/20  1:05 PM   Specimen: Nasal Mucosa; Nasopharyngeal  Result Value Ref Range Status   MRSA by PCR NEGATIVE NEGATIVE Final    Comment:        The GeneXpert MRSA Assay (FDA approved for NASAL specimens only), is one component of a comprehensive MRSA colonization surveillance program. It is not intended to diagnose MRSA infection nor to guide or monitor treatment for MRSA infections. Performed at [facility], [place] [address], [place], [place] [address]      Labs: Basic Metabolic Panel: Recent Labs  Lab 06/21/20 1958 06/22/20 0500 06/23/20 1219 06/24/20 0714  NA 132* 135 139 140  K 5.1 4.5 4.6 4.5  CL 101 108 111 111  CO2 19* 18* 19* 20*  GLUCOSE 127* 126* 86 86  BUN 80* 70* 36* 29*  CREATININE 2.82*  2.25* 1.66* 1.40*  CALCIUM 9.1 8.4* 8.6* 8.2*   Liver Function Tests: Recent Labs  Lab 06/21/20 1958 06/22/20 0500  AST 35 32  ALT 32 27  ALKPHOS 69 57  BILITOT 0.9 0.6  PROT 9.1* 7.7  ALBUMIN 3.9 3.2*   No results for input(s): LIPASE, AMYLASE in the last 168 hours. No results for input(s): AMMONIA in the last 168 hours. CBC: Recent Labs  Lab 06/21/20 1958 06/22/20 0500 06/24/20 0714  WBC 7.1 5.8 5.7  NEUTROABS 3.6  --  2.1  HGB 9.9* 8.7* 8.6*  HCT 30.4* 25.9* 27.1*  MCV 82.4 82.0 84.4  PLT 159 132* 131*   Cardiac Enzymes: No results for input(s): CKTOTAL, CKMB, CKMBINDEX, TROPONINI in the last 168 hours. BNP: BNP (last 3 results) Recent Labs    06/21/20 1944  BNP 109.3*    ProBNP (last 3 results) No results for input(s): PROBNP in the last 8760 hours.  CBG: No results for input(s): GLUCAP in the last 168 hours.     Signed:  Florencia Reasons MD, PhD, FACP  [facility] 06/24/2020, 12:36 PM

## 2020-06-27 ENCOUNTER — Telehealth: Payer: Self-pay | Admitting: Internal Medicine

## 2020-06-27 NOTE — Telephone Encounter (Signed)
Scheduled appt per 12/13 sch msg - pt daughter is aware of appt date and time

## 2020-06-28 LAB — UPEP/UIFE/LIGHT CHAINS/TP, 24-HR UR
% BETA, Urine: 30.9 %
ALPHA 1 URINE: 1.1 %
Albumin, U: 10.5 %
Alpha 2, Urine: 6.7 %
Free Kappa Lt Chains,Ur: 492.38 mg/L — ABNORMAL HIGH (ref 0.63–113.79)
Free Kappa/Lambda Ratio: 112.16 — ABNORMAL HIGH (ref 1.03–31.76)
Free Lambda Lt Chains,Ur: 4.39 mg/L (ref 0.47–11.77)
GAMMA GLOBULIN URINE: 50.8 %
M-SPIKE %, Urine: 39 % — ABNORMAL HIGH
M-Spike, Mg/24 Hr: 75 mg/24 hr — ABNORMAL HIGH
Total Protein, Urine-Ur/day: 191 mg/24 hr — ABNORMAL HIGH (ref 30–150)
Total Protein, Urine: 11.6 mg/dL
Total Volume: 1650

## 2020-06-29 ENCOUNTER — Encounter: Payer: Self-pay | Admitting: Cardiology

## 2020-06-29 ENCOUNTER — Ambulatory Visit: Payer: Medicare HMO | Admitting: Cardiology

## 2020-06-29 ENCOUNTER — Other Ambulatory Visit: Payer: Self-pay

## 2020-06-29 VITALS — BP 158/50 | HR 69 | Ht 67.0 in | Wt 195.0 lb

## 2020-06-29 DIAGNOSIS — Z951 Presence of aortocoronary bypass graft: Secondary | ICD-10-CM | POA: Diagnosis not present

## 2020-06-29 DIAGNOSIS — E860 Dehydration: Secondary | ICD-10-CM | POA: Diagnosis not present

## 2020-06-29 DIAGNOSIS — E785 Hyperlipidemia, unspecified: Secondary | ICD-10-CM | POA: Diagnosis not present

## 2020-06-29 DIAGNOSIS — I10 Essential (primary) hypertension: Secondary | ICD-10-CM

## 2020-06-29 DIAGNOSIS — R600 Localized edema: Secondary | ICD-10-CM

## 2020-06-29 DIAGNOSIS — I6523 Occlusion and stenosis of bilateral carotid arteries: Secondary | ICD-10-CM | POA: Diagnosis not present

## 2020-06-29 DIAGNOSIS — N183 Chronic kidney disease, stage 3 unspecified: Secondary | ICD-10-CM

## 2020-06-29 NOTE — Patient Instructions (Signed)
Medication Instructions:  Continue current medications  *If you need a refill on your cardiac medications before your next appointment, please call your pharmacy*   Lab Work: BMP  If you have labs (blood work) drawn today and your tests are completely normal, you will receive your results only by: [name] MyChart Message (if you have MyChart) OR . A paper copy in the mail If you have any lab test that is abnormal or we need to change your treatment, we will call you to review the results.   Testing/Procedures: None Ordered   Follow-Up: At [facility], you and your health needs are our priority.  As part of our continuing mission to provide you with exceptional heart care, we have created designated Provider Care Teams.  These Care Teams include your primary Cardiologist (physician) and Advanced Practice Providers (APPs -  Physician Assistants and Nurse Practitioners) who all work together to provide you with the care you need, when you need it.  We recommend signing up for the patient portal called "MyChart".  Sign up information is provided on this After Visit Summary.  MyChart is used to connect with patients for Virtual Visits (Telemedicine).  Patients are able to view lab/test results, encounter notes, upcoming appointments, etc.  Non-urgent messages can be sent to your provider as well.   To learn more about what you can do with MyChart, go to [email or web address].    Your next appointment:   6-8 week(s)  The format for your next appointment:   In Person  Provider:   You may see Quay Burow, MD or one of the following Advanced Practice Providers on your designated Care Team:    Kerin Ransom, PA-C  [place], [place]  Coletta Memos, [place]    Other Instructions Give our office a call if weight get below 190lbs

## 2020-06-29 NOTE — Progress Notes (Signed)
Cardiology Office Note:    Date:  06/29/2020   ID:  [name], DOB 04-16-1934, MRN [id number]  PCP:  Tara Pretty, MD  Cardiologist:  Quay Burow, MD  Electrophysiologist:  None   Referring MD: Tara Pretty, MD   CC: post hospital f/u  History of Present Illness:    Tara Potts is a 86 y.o. female with a hx of CAD, status post CABG in 2007. Cath in 2009 showed distal native disease treated medically. Myoview in 2012 was low risk. Her last echo was in May 2021 and showed preserved LV function. Other medical issues include hypertension, dyslipidemia, and vascular disease with moderate bilateral internal carotid artery narrowing.   Dr Tara Potts her in the office 10/23/2019 with complaints of lower extremity edema and some shortness of breath. He had her increase her furosemide from 40 mg a day to 40 mg twice daily for a week and then come back for follow-up. I saw the patient in follow up 11/20/2019 in the office. Her feet were still swollen. She was not particularly short of breath. She continued to have LE edema.  She said she was wearing her support stockings.  She had no orthopnea. Echo 12/10/2019 showed normal LVF, elevated LVEDP, and moderate MR.  Her BUN and SCr did slightly elevated with the addition of Lasix and I was hesitant to push her diuretics.  The patient saw Dr Tara Potts in November 2021 and she again had LE edema.  He increased her Lasix to 40 mg BID from 60 mg daily.  After this her PCP apparently added Spironolactone.  On 06/21/2020 she presented with dehydration and AKI.-SCr 2.82  Her diuretics were held, Spirolactone and Losartan stopped.  At discharge she was told to resume her Lasix 40 mg BID.  Her Scr had improved to 1.4 at discharge.  She presents to the office today for follow up.  Her daughter accompnaied her. Overall she seems to be doing well, (the patient doesn't offer much in the way of history).  She does still have some LE edema but no orthopnea or  unusual dyspnea.  "Im just tired".   Past Medical History:  Diagnosis Date  . Anemia   . Arthritis   . Asthma    as a child  . CAD (coronary artery disease)   . Carotid disease, bilateral (HCC)    mild  . Fibroid   . Hx of CABG 2007   [facility]  . Hyperlipemia   . Hypertension   . Menopausal symptoms   . Peripheral neuropathy   . Seizure-like activity [facility])     Past Surgical History:  Procedure Laterality Date  . CARDIAC CATHETERIZATION  01/09/2008   diffusely diseased graft to the PDA & diffuse PDA disease  . CAROTID DOPPLER  08/27/2011   mod. right & nild left ICA stenosis  . CATARACT SURGERY    . CORONARY ARTERY BYPASS GRAFT    . FOOT SURGERY    . JOINT REPLACEMENT     Right hip Dr. Alvan Potts 01-28-18   . MASS REMOVED FROM CERVIX  12/1997   Benign endocervical inclusion cysts  . NM MYOVIEW LTD  01/12/2011   no ischemia  . TOTAL HIP ARTHROPLASTY Right 01/28/2018   Procedure: RIGHT TOTAL HIP ARTHROPLASTY ANTERIOR APPROACH;  Surgeon: Tara Cancel, MD;  Location: [facility];  Service: Orthopedics;  Laterality: Right;  70 mins  . TRIPLE HEART BYPASS  05/2006   [facility]  . US ECHOCARDIOGRAPHY  10/30/2007  mild MA,MR,TR,AOV mildly sclerotic,density oted in the prox asc aorta    Current Medications: No outpatient medications have been marked as taking for the 06/29/20 encounter (Office Visit) with Erlene Quan, PA-C.     Allergies:   Codeine and Cymbalta [duloxetine hcl]   Social History   Socioeconomic History  . Marital status: Widowed    Spouse name: Not on file  . Number of children: 1  . Years of education: college  . Highest education level: Not on file  Occupational History  . Not on file  Tobacco Use  . Smoking status: Former Research scientist (life sciences)  . Smokeless tobacco: Never Used  . Tobacco comment: Quit 30 years ago  Vaping Use  . Vaping Use: Never used  Substance and Sexual Activity  . Alcohol use: No    Alcohol/week: 0.0 standard drinks  . Drug use: No   . Sexual activity: Not Currently    Birth control/protection: Post-menopausal    Comment: intercourse age 55, more than 5 sexual partners  Other Topics Concern  . Not on file  Social History Narrative   Patient lives at home and her daughter and son in law live with her. Widowed.   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   Education college.   Left handed.   Caffeine     Social Determinants of Radio broadcast assistant Strain: Not on file  Food Insecurity: Not on file  Transportation Needs: Not on file  Physical Activity: Not on file  Stress: Not on file  Social Connections: Not on file     Family History: The patient's family history includes Breast cancer in her maternal aunt; Diabetes in her mother; Heart disease in her mother; Heart failure in her maternal aunt, maternal uncle, and mother; Hypertension in her mother and sister.  ROS:   Please see the history of present illness.     All other systems reviewed and are negative.  EKGs/Labs/Other Studies Reviewed:    The following studies were reviewed today: Echo May 2021- IMPRESSIONS    1. Left ventricular ejection fraction, by estimation, is 60 to 65%. The  left ventricle has normal function. The left ventricle has no regional  wall motion abnormalities. Left ventricular diastolic parameters are  indeterminate. Elevated left ventricular  end-diastolic pressure.  2. Right ventricular systolic function is normal. The right ventricular  size is normal. There is mildly elevated pulmonary artery systolic  pressure.  3. The mitral valve is normal in structure. Moderate mitral valve  regurgitation. No evidence of mitral stenosis.  4. The aortic valve is tricuspid. Aortic valve regurgitation is not  visualized. Mild to moderate aortic valve sclerosis/calcification is  present, without any evidence of aortic stenosis.  5. The inferior vena cava is normal in size with greater than 50%  respiratory variability,  suggesting right atrial pressure of 3 mmHg.   EKG:  EKG is not ordered today.  The ekg ordered 06/21/2020 demonstrates NSR-77  Recent Labs: 06/21/2020: B Natriuretic Peptide 109.3 06/22/2020: ALT 27 06/23/2020: TSH 1.288 06/24/2020: BUN 29; Creatinine, Ser 1.40; Hemoglobin 8.6; Platelets 131; Potassium 4.5; Sodium 140  Recent Lipid Panel    Component Value Date/Time   CHOL 218 (H) 05/21/2019 1205   TRIG 85 05/21/2019 1205   HDL 57 05/21/2019 1205   CHOLHDL 3.8 05/21/2019 1205   LDLCALC 146 (H) 05/21/2019 1205    Physical Exam:    VS:  BP (!) 158/50 (BP Location: Left Arm, Patient Position: Sitting, Cuff Size: Normal)  Pulse 69   Ht 5\' 7"  (1.702 m)   Wt 195 lb (88.5 kg)   LMP 07/16/2002   BMI 30.54 kg/m     Wt Readings from Last 3 Encounters:  06/29/20 195 lb (88.5 kg)  06/24/20 198 lb 13.7 oz (90.2 kg)  05/24/20 203 lb (92.1 kg)     GEN:  Well nourished, well developed in no acute distress HEENT: Normal NECK: No JVD; No carotid bruits CARDIAC: RRR, no murmurs, rubs, gallops RESPIRATORY:  Clear to auscultation without rales, wheezing or rhonchi  ABDOMEN: Soft, non-distended MUSCULOSKELETAL:  1+ pedal edema; No deformity  SKIN: Warm and dry NEUROLOGIC:  Alert and oriented x 3 PSYCHIATRIC:  Normal affect   ASSESSMENT:    Bilateral lower extremity edema Suspect venous insufficiency- treat with furosemide and support stockings.   Hx of CABG CABG '07 at [facility], cath '09-medical Rx, Myoview 2012 low risk  Essential hypertension Echo showed normal LVF with elevated LVEDP and moderate MR. Losartan stopped and Hydralazine added this last admission.  B/P by me 124/60  Carotid artery disease (HCC) Moderate bilateral ICA stenosis  Dyslipidemia, goal LDL below 70 Referred to lipid clinic  CRI (chronic renal insufficiency), stage 3 (moderate) GFR 36.   PLAN:    Check BMP-daily weight- if her weight goes to 190 lbs or less I think we'll need to back off her  diuretics.  I encouraged the use of support stocking (not on today) and reminded her to put them on before she gets out of bed.    Medication Adjustments/Labs and Tests Ordered: Current medicines are reviewed at length with the patient today.  Concerns regarding medicines are outlined above.  No orders of the defined types were placed in this encounter.  No orders of the defined types were placed in this encounter.   There are no Patient Instructions on file for this visit.   Signed, Kerin Ransom, PA-C  06/29/2020 4:05 PM    [facility]

## 2020-06-30 DIAGNOSIS — N1832 Chronic kidney disease, stage 3b: Secondary | ICD-10-CM | POA: Diagnosis not present

## 2020-06-30 DIAGNOSIS — D630 Anemia in neoplastic disease: Secondary | ICD-10-CM | POA: Diagnosis not present

## 2020-06-30 DIAGNOSIS — I1310 Hypertensive heart and chronic kidney disease without heart failure, with stage 1 through stage 4 chronic kidney disease, or unspecified chronic kidney disease: Secondary | ICD-10-CM | POA: Diagnosis not present

## 2020-06-30 DIAGNOSIS — I10 Essential (primary) hypertension: Secondary | ICD-10-CM | POA: Diagnosis not present

## 2020-06-30 DIAGNOSIS — M545 Low back pain, unspecified: Secondary | ICD-10-CM | POA: Diagnosis not present

## 2020-06-30 DIAGNOSIS — G629 Polyneuropathy, unspecified: Secondary | ICD-10-CM | POA: Diagnosis not present

## 2020-06-30 DIAGNOSIS — N179 Acute kidney failure, unspecified: Secondary | ICD-10-CM | POA: Diagnosis not present

## 2020-06-30 DIAGNOSIS — I2510 Atherosclerotic heart disease of native coronary artery without angina pectoris: Secondary | ICD-10-CM | POA: Diagnosis not present

## 2020-06-30 DIAGNOSIS — D631 Anemia in chronic kidney disease: Secondary | ICD-10-CM | POA: Diagnosis not present

## 2020-06-30 DIAGNOSIS — C9000 Multiple myeloma not having achieved remission: Secondary | ICD-10-CM | POA: Diagnosis not present

## 2020-06-30 LAB — BASIC METABOLIC PANEL
BUN/Creatinine Ratio: 19 (ref 12–28)
BUN: 42 mg/dL — ABNORMAL HIGH (ref 8–27)
CO2: 21 mmol/L (ref 20–29)
Calcium: 9.1 mg/dL (ref 8.7–10.3)
Chloride: 99 mmol/L (ref 96–106)
Creatinine, Ser: 2.25 mg/dL — ABNORMAL HIGH (ref 0.57–1.00)
GFR calc Af Amer: 22 mL/min/{1.73_m2} — ABNORMAL LOW (ref >59)
GFR calc non Af Amer: 19 mL/min/{1.73_m2} — ABNORMAL LOW (ref >59)
Glucose: 141 mg/dL — ABNORMAL HIGH (ref 65–99)
Potassium: 4.5 mmol/L (ref 3.5–5.2)
Sodium: 136 mmol/L (ref 134–144)

## 2020-07-04 ENCOUNTER — Other Ambulatory Visit: Payer: Self-pay | Admitting: Cardiology

## 2020-07-04 DIAGNOSIS — C9000 Multiple myeloma not having achieved remission: Secondary | ICD-10-CM | POA: Diagnosis not present

## 2020-07-04 DIAGNOSIS — N179 Acute kidney failure, unspecified: Secondary | ICD-10-CM | POA: Diagnosis not present

## 2020-07-04 DIAGNOSIS — G629 Polyneuropathy, unspecified: Secondary | ICD-10-CM | POA: Diagnosis not present

## 2020-07-04 DIAGNOSIS — I2510 Atherosclerotic heart disease of native coronary artery without angina pectoris: Secondary | ICD-10-CM | POA: Diagnosis not present

## 2020-07-04 DIAGNOSIS — D631 Anemia in chronic kidney disease: Secondary | ICD-10-CM | POA: Diagnosis not present

## 2020-07-04 DIAGNOSIS — N1832 Chronic kidney disease, stage 3b: Secondary | ICD-10-CM | POA: Diagnosis not present

## 2020-07-04 DIAGNOSIS — I1310 Hypertensive heart and chronic kidney disease without heart failure, with stage 1 through stage 4 chronic kidney disease, or unspecified chronic kidney disease: Secondary | ICD-10-CM | POA: Diagnosis not present

## 2020-07-04 DIAGNOSIS — M545 Low back pain, unspecified: Secondary | ICD-10-CM | POA: Diagnosis not present

## 2020-07-04 DIAGNOSIS — D630 Anemia in neoplastic disease: Secondary | ICD-10-CM | POA: Diagnosis not present

## 2020-07-06 ENCOUNTER — Other Ambulatory Visit: Payer: Self-pay

## 2020-07-06 DIAGNOSIS — Z79899 Other long term (current) drug therapy: Secondary | ICD-10-CM

## 2020-07-06 DIAGNOSIS — I2510 Atherosclerotic heart disease of native coronary artery without angina pectoris: Secondary | ICD-10-CM

## 2020-07-06 MED ORDER — FUROSEMIDE 40 MG PO TABS: 40.0000 mg | ORAL_TABLET | Freq: Every day | ORAL | 3 refills | Status: DC

## 2020-07-07 DIAGNOSIS — I1310 Hypertensive heart and chronic kidney disease without heart failure, with stage 1 through stage 4 chronic kidney disease, or unspecified chronic kidney disease: Secondary | ICD-10-CM | POA: Diagnosis not present

## 2020-07-07 DIAGNOSIS — N179 Acute kidney failure, unspecified: Secondary | ICD-10-CM | POA: Diagnosis not present

## 2020-07-07 DIAGNOSIS — M545 Low back pain, unspecified: Secondary | ICD-10-CM | POA: Diagnosis not present

## 2020-07-07 DIAGNOSIS — I2510 Atherosclerotic heart disease of native coronary artery without angina pectoris: Secondary | ICD-10-CM | POA: Diagnosis not present

## 2020-07-07 DIAGNOSIS — C9000 Multiple myeloma not having achieved remission: Secondary | ICD-10-CM | POA: Diagnosis not present

## 2020-07-07 DIAGNOSIS — G629 Polyneuropathy, unspecified: Secondary | ICD-10-CM | POA: Diagnosis not present

## 2020-07-07 DIAGNOSIS — N1832 Chronic kidney disease, stage 3b: Secondary | ICD-10-CM | POA: Diagnosis not present

## 2020-07-07 DIAGNOSIS — D630 Anemia in neoplastic disease: Secondary | ICD-10-CM | POA: Diagnosis not present

## 2020-07-07 DIAGNOSIS — D631 Anemia in chronic kidney disease: Secondary | ICD-10-CM | POA: Diagnosis not present

## 2020-07-11 ENCOUNTER — Inpatient Hospital Stay: Payer: Medicare HMO

## 2020-07-11 ENCOUNTER — Inpatient Hospital Stay (HOSPITAL_BASED_OUTPATIENT_CLINIC_OR_DEPARTMENT_OTHER): Payer: Medicare HMO | Admitting: Internal Medicine

## 2020-07-11 ENCOUNTER — Other Ambulatory Visit: Payer: Self-pay

## 2020-07-11 ENCOUNTER — Encounter: Payer: Self-pay | Admitting: Internal Medicine

## 2020-07-11 VITALS — BP 151/53 | HR 76 | Temp 99.4°F | Resp 18 | Ht 67.0 in | Wt 197.6 lb

## 2020-07-11 DIAGNOSIS — C9000 Multiple myeloma not having achieved remission: Secondary | ICD-10-CM

## 2020-07-11 DIAGNOSIS — I10 Essential (primary) hypertension: Secondary | ICD-10-CM

## 2020-07-11 DIAGNOSIS — Z96641 Presence of right artificial hip joint: Secondary | ICD-10-CM | POA: Diagnosis not present

## 2020-07-11 DIAGNOSIS — N189 Chronic kidney disease, unspecified: Secondary | ICD-10-CM | POA: Diagnosis not present

## 2020-07-11 DIAGNOSIS — Z951 Presence of aortocoronary bypass graft: Secondary | ICD-10-CM | POA: Diagnosis not present

## 2020-07-11 DIAGNOSIS — D649 Anemia, unspecified: Secondary | ICD-10-CM | POA: Diagnosis not present

## 2020-07-11 LAB — CBC WITH DIFFERENTIAL (CANCER CENTER ONLY)
Abs Immature Granulocytes: 0.07 10*3/uL (ref 0.00–0.07)
Basophils Absolute: 0 10*3/uL (ref 0.0–0.1)
Basophils Relative: 1 %
Eosinophils Absolute: 0.2 10*3/uL (ref 0.0–0.5)
Eosinophils Relative: 4 %
HCT: 28.1 % — ABNORMAL LOW (ref 36.0–46.0)
Hemoglobin: 9.1 g/dL — ABNORMAL LOW (ref 12.0–15.0)
Immature Granulocytes: 1 %
Lymphocytes Relative: 21 %
Lymphs Abs: 1.2 10*3/uL (ref 0.7–4.0)
MCH: 26.7 pg (ref 26.0–34.0)
MCHC: 32.4 g/dL (ref 30.0–36.0)
MCV: 82.4 fL (ref 80.0–100.0)
Monocytes Absolute: 0.8 10*3/uL (ref 0.1–1.0)
Monocytes Relative: 14 %
Neutro Abs: 3.4 10*3/uL (ref 1.7–7.7)
Neutrophils Relative %: 59 %
Platelet Count: 239 10*3/uL (ref 150–400)
RBC: 3.41 MIL/uL — ABNORMAL LOW (ref 3.87–5.11)
RDW: 18 % — ABNORMAL HIGH (ref 11.5–15.5)
WBC Count: 5.7 10*3/uL (ref 4.0–10.5)
nRBC: 0 % (ref 0.0–0.2)

## 2020-07-11 LAB — CMP (CANCER CENTER ONLY)
ALT: 36 U/L (ref 0–44)
AST: 41 U/L (ref 15–41)
Albumin: 3.5 g/dL (ref 3.5–5.0)
Alkaline Phosphatase: 65 U/L (ref 38–126)
Anion gap: 8 (ref 5–15)
BUN: 35 mg/dL — ABNORMAL HIGH (ref 8–23)
CO2: 26 mmol/L (ref 22–32)
Calcium: 9.2 mg/dL (ref 8.9–10.3)
Chloride: 104 mmol/L (ref 98–111)
Creatinine: 1.98 mg/dL — ABNORMAL HIGH (ref 0.44–1.00)
GFR, Estimated: 24 mL/min — ABNORMAL LOW (ref >60)
Glucose, Bld: 100 mg/dL — ABNORMAL HIGH (ref 70–99)
Potassium: 4.3 mmol/L (ref 3.5–5.1)
Sodium: 138 mmol/L (ref 135–145)
Total Bilirubin: 0.6 mg/dL (ref 0.3–1.2)
Total Protein: 8.8 g/dL — ABNORMAL HIGH (ref 6.5–8.1)

## 2020-07-11 LAB — LACTATE DEHYDROGENASE: LDH: 208 U/L — ABNORMAL HIGH (ref 98–192)

## 2020-07-11 NOTE — Progress Notes (Signed)
[place] [phone]   Fax:([phone]) [place], MD [place] [address]  DIAGNOSIS: Plasma cell neoplasm diagnosed in October 2019.  PRIOR THERAPY: None  CURRENT THERAPY: Observation.  INTERVAL HISTORY: Tara HAALAND 86 y.o. female returns to the clinic today for follow-up visit accompanied by her daughter.  The patient was last seen in February 2020.  She was lost to follow-up since that time.  She came today for evaluation and recommendation regarding her plasma cell neoplasm.  She denied having any current chest pain, shortness of breath, cough or hemoptysis.  She denied having any fever or chills.  She has no nausea, vomiting, diarrhea or constipation.  She had repeat myeloma panel performed earlier today and she is here for discussion of her lab results.  MEDICAL HISTORY: Past Medical History:  Diagnosis Date  . Anemia   . Arthritis   . Asthma    as a child  . CAD (coronary artery disease)   . Carotid disease, bilateral (HCC)    mild  . Fibroid   . Hx of CABG 2007   [facility]  . Hyperlipemia   . Hypertension   . Menopausal symptoms   . Peripheral neuropathy   . Seizure-like activity (HCC)     ALLERGIES:  is allergic to codeine and cymbalta [duloxetine hcl].  MEDICATIONS:  Current Outpatient Medications  Medication Sig Dispense Refill  . Ascorbic Acid (VITAMIN C PO) Take 1 tablet by mouth daily.     [name] aspirin EC 81 MG tablet Take 81 mg by mouth daily.    [name] atorvastatin (LIPITOR) 80 MG tablet Take 80 mg by mouth daily.    . Calcium-Magnesium-Vitamin D (CALCIUM MAGNESIUM PO) Take 1 tablet by mouth daily.     . Cholecalciferol (VITAMIN D) 2000 units tablet Take 2,000 Units by mouth daily.    . Coenzyme Q10 (COQ10 PO) Take 1 capsule by mouth daily.    . furosemide (LASIX) 40 MG tablet Take 1 tablet (40 mg total) by mouth daily. Please weight yourself daily, check  your legs daily, if your weight go up by 3lbs in three days and legs are swollen, take lasix 65m  in the morning and call your doctor 180 tablet 3  . GARLIC PO Take 1 tablet by mouth 2 (two) times daily.     . Glucosamine HCl (GLUCOSAMINE PO) Take 1 tablet by mouth daily.    . hydrALAZINE (APRESOLINE) 25 MG tablet TAKE 1 TABLET (25 MG TOTAL) BY MOUTH IN THE MORNING AND AT BEDTIME. 180 tablet 1  . metoprolol tartrate (LOPRESSOR) 25 MG tablet Take 3 tablets (75 mg total) by mouth 2 (two) times daily. 180 tablet 0  . potassium chloride (KLOR-CON) 10 MEQ tablet TAKE 1 TABLET(10 MEQ) BY MOUTH DAILY (Patient taking differently: Take 10 mEq by mouth daily. TAKE 1 TABLET(10 MEQ) BY MOUTH DAILY) 90 tablet 3  . senna-docusate (SENOKOT-S) 8.6-50 MG tablet Take 1 tablet by mouth at bedtime. 30 tablet 0   No current facility-administered medications for this visit.    SURGICAL HISTORY:  Past Surgical History:  Procedure Laterality Date  . CARDIAC CATHETERIZATION  01/09/2008   diffusely diseased graft to the PDA & diffuse PDA disease  . CAROTID DOPPLER  08/27/2011   mod. right & nild left ICA stenosis  . CATARACT SURGERY    . CORONARY ARTERY BYPASS GRAFT    . FOOT SURGERY    .  JOINT REPLACEMENT     Right hip Dr. Alvan Dame 01-28-18   . MASS REMOVED FROM CERVIX  12/1997   Benign endocervical inclusion cysts  . NM MYOVIEW LTD  01/12/2011   no ischemia  . TOTAL HIP ARTHROPLASTY Right 01/28/2018   Procedure: RIGHT TOTAL HIP ARTHROPLASTY ANTERIOR APPROACH;  Surgeon: Paralee Cancel, MD;  Location: [facility];  Service: Orthopedics;  Laterality: Right;  70 mins  . TRIPLE HEART BYPASS  05/2006   [facility]  . US ECHOCARDIOGRAPHY  10/30/2007   mild MA,MR,TR,AOV mildly sclerotic,density oted in the prox asc aorta    REVIEW OF SYSTEMS:  A comprehensive review of systems was negative except for: Constitutional: positive for fatigue   PHYSICAL EXAMINATION: General appearance: alert, cooperative, fatigued and no  distress Head: Normocephalic, without obvious abnormality, atraumatic Neck: no adenopathy, no JVD, supple, symmetrical, trachea midline and thyroid not enlarged, symmetric, no tenderness/mass/nodules Lymph nodes: Cervical, supraclavicular, and axillary nodes normal. Resp: clear to auscultation bilaterally Back: symmetric, no curvature. ROM normal. No CVA tenderness. Cardio: regular rate and rhythm, S1, S2 normal, no murmur, click, rub or gallop GI: soft, non-tender; bowel sounds normal; no masses,  no organomegaly Extremities: extremities normal, atraumatic, no cyanosis or edema  ECOG PERFORMANCE STATUS: 1 - Symptomatic but completely ambulatory  Blood pressure (!) 151/53, pulse 76, temperature 99.4 F (37.4 C), temperature source Oral, resp. rate 18, height 5' 7" (1.702 m), weight 197 lb 9.6 oz (89.6 kg), last menstrual period 07/16/2002, SpO2 100 %.  LABORATORY DATA: Lab Results  Component Value Date   WBC 5.7 07/11/2020   HGB 9.1 (L) 07/11/2020   HCT 28.1 (L) 07/11/2020   MCV 82.4 07/11/2020   PLT 239 07/11/2020      Chemistry      Component Value Date/Time   NA 136 06/29/2020 1620   NA 138 07/03/2017 0928   K 4.5 06/29/2020 1620   K 4.6 07/03/2017 0928   CL 99 06/29/2020 1620   CO2 21 06/29/2020 1620   CO2 29 07/03/2017 0928   BUN 42 (H) 06/29/2020 1620   BUN 25.5 07/03/2017 0928   CREATININE 2.25 (H) 06/29/2020 1620   CREATININE 1.49 (H) 08/25/2018 1137   CREATININE 1.5 (H) 07/03/2017 0928      Component Value Date/Time   CALCIUM 9.1 06/29/2020 1620   CALCIUM 9.4 07/03/2017 0928   ALKPHOS 57 06/22/2020 0500   ALKPHOS 72 07/03/2017 0928   AST 32 06/22/2020 0500   AST 33 08/25/2018 1137   AST 25 07/03/2017 0928   ALT 27 06/22/2020 0500   ALT 25 08/25/2018 1137   ALT 17 07/03/2017 0928   BILITOT 0.6 06/22/2020 0500   BILITOT 0.5 05/21/2019 1206   BILITOT 0.6 08/25/2018 1137   BILITOT 0.51 07/03/2017 0928     Myeloma panel: Beta-2 microglobulin 3.1, IgG  1826, IgA 121, IgM 30, free kappa light chain 452.7, free lambda light chain 9.1 with a kappa/lambda ratio of 49.75  RADIOGRAPHIC STUDIES: DG Chest 2 View  Result Date: 06/21/2020 CLINICAL DATA:  Shortness of breath EXAM: CHEST - 2 VIEW COMPARISON:  None. FINDINGS: The heart size and mediastinal contours are within normal limits. Both lungs are clear. No pleural effusion or pneumothorax. The visualized skeletal structures are unremarkable. IMPRESSION: No acute process in the chest. Electronically Signed   By: Macy Mis M.D.   On: 06/21/2020 14:18   US RENAL  Result Date: 06/22/2020 CLINICAL DATA:  Elevated creatinine. EXAM: RENAL / URINARY TRACT ULTRASOUND COMPLETE COMPARISON:  Ultrasound 08/22/2018 FINDINGS: Right Kidney: Renal measurements: 8.0 x 4.4 x 4.9 cm = volume: 92.0 mL. Normal renal cortical thickness and echogenicity for age. No worrisome renal lesions, renal calculi or hydronephrosis. Left Kidney: Renal measurements: 8.5 x 4.2 x 5.0 cm = volume: 93.0 mL. Normal renal cortical thickness and echogenicity for age. No worrisome renal lesions, renal calculi or hydronephrosis. Bladder: Normal Other: None. IMPRESSION: Unremarkable renal ultrasound examination. Electronically Signed   By: Marijo Sanes M.D.   On: 06/22/2020 09:41   VAS [place] LOWER EXTREMITY VENOUS (DVT)  Result Date: 06/22/2020  Lower Venous DVT Study Indications: Edema.  Risk Factors: None identified. Limitations: Poor ultrasound/tissue interface. Comparison Study: No prior studies. Performing Technologist: Oliver Hum RVT  Examination Guidelines: A complete evaluation includes B-mode imaging, spectral Doppler, color Doppler, and power Doppler as needed of all accessible portions of each vessel. Bilateral testing is considered an integral part of a complete examination. Limited examinations for reoccurring indications may be performed as noted. The reflux portion of the exam is performed with the patient in reverse  Trendelenburg.  +---------+---------------+---------+-----------+----------+--------------+ RIGHT    CompressibilityPhasicitySpontaneityPropertiesThrombus Aging +---------+---------------+---------+-----------+----------+--------------+ CFV      Full           Yes      Yes                                 +---------+---------------+---------+-----------+----------+--------------+ SFJ      Full                                                        +---------+---------------+---------+-----------+----------+--------------+ FV Prox  Full                                                        +---------+---------------+---------+-----------+----------+--------------+ FV Mid   Full                                                        +---------+---------------+---------+-----------+----------+--------------+ FV DistalFull                                                        +---------+---------------+---------+-----------+----------+--------------+ PFV      Full                                                        +---------+---------------+---------+-----------+----------+--------------+ POP      Full           Yes      Yes                                 +---------+---------------+---------+-----------+----------+--------------+  PTV      Full                                                        +---------+---------------+---------+-----------+----------+--------------+ PERO     Full                                                        +---------+---------------+---------+-----------+----------+--------------+   +---------+---------------+---------+-----------+----------+--------------+ LEFT     CompressibilityPhasicitySpontaneityPropertiesThrombus Aging +---------+---------------+---------+-----------+----------+--------------+ CFV      Full           Yes      Yes                                  +---------+---------------+---------+-----------+----------+--------------+ SFJ      Full                                                        +---------+---------------+---------+-----------+----------+--------------+ FV Prox  Full                                                        +---------+---------------+---------+-----------+----------+--------------+ FV Mid   Full                                                        +---------+---------------+---------+-----------+----------+--------------+ FV DistalFull                                                        +---------+---------------+---------+-----------+----------+--------------+ PFV      Full                                                        +---------+---------------+---------+-----------+----------+--------------+ POP      Full           Yes      Yes                                 +---------+---------------+---------+-----------+----------+--------------+ PTV      Full                                                        +---------+---------------+---------+-----------+----------+--------------+  PERO     Full                                                        +---------+---------------+---------+-----------+----------+--------------+     Summary: RIGHT: - There is no evidence of deep vein thrombosis in the lower extremity.  - No cystic structure found in the popliteal fossa.  LEFT: - There is no evidence of deep vein thrombosis in the lower extremity.  - No cystic structure found in the popliteal fossa.  *See table(s) above for measurements and observations. Electronically signed by Servando Snare MD on 06/22/2020 at 4:52:59 PM.    Final     ASSESSMENT AND PLAN:  This is a very pleasant 86 years old white female with plasma cell neoplasm, multiple myeloma.  The patient has been in observation for several years since she declined any treatment.  She was supposed to come back in  May 2020 but she was lost to follow-up and finally she admitted to the clinic today. She had repeat myeloma panel performed earlier today.  Her CBC showed persistent anemia and the comprehensive metabolic panel showed worsening renal insufficiency.  The remaining labs are still pending. If the remaining myeloma panel showed evidence for disease progression, I will ask the patient to come sooner for evaluation and discussion of her treatment options otherwise I will see her back for follow-up visit in 6 months with repeat myeloma panel. The patient was advised to call immediately if she has any other concerning symptoms in the interval. The patient voices understanding of current disease status and treatment options and is in agreement with the current care plan.  All questions were answered. The patient knows to call the clinic with any problems, questions or concerns. We can certainly see the patient much sooner if necessary.  Disclaimer: This note was dictated with voice recognition software. Similar sounding words can inadvertently be transcribed and may not be corrected upon review.

## 2020-07-12 ENCOUNTER — Telehealth: Payer: Self-pay | Admitting: Internal Medicine

## 2020-07-12 DIAGNOSIS — N179 Acute kidney failure, unspecified: Secondary | ICD-10-CM | POA: Diagnosis not present

## 2020-07-12 DIAGNOSIS — M545 Low back pain, unspecified: Secondary | ICD-10-CM | POA: Diagnosis not present

## 2020-07-12 DIAGNOSIS — I2510 Atherosclerotic heart disease of native coronary artery without angina pectoris: Secondary | ICD-10-CM | POA: Diagnosis not present

## 2020-07-12 DIAGNOSIS — N1832 Chronic kidney disease, stage 3b: Secondary | ICD-10-CM | POA: Diagnosis not present

## 2020-07-12 DIAGNOSIS — G629 Polyneuropathy, unspecified: Secondary | ICD-10-CM | POA: Diagnosis not present

## 2020-07-12 DIAGNOSIS — C9000 Multiple myeloma not having achieved remission: Secondary | ICD-10-CM | POA: Diagnosis not present

## 2020-07-12 DIAGNOSIS — D631 Anemia in chronic kidney disease: Secondary | ICD-10-CM | POA: Diagnosis not present

## 2020-07-12 DIAGNOSIS — D630 Anemia in neoplastic disease: Secondary | ICD-10-CM | POA: Diagnosis not present

## 2020-07-12 DIAGNOSIS — I1310 Hypertensive heart and chronic kidney disease without heart failure, with stage 1 through stage 4 chronic kidney disease, or unspecified chronic kidney disease: Secondary | ICD-10-CM | POA: Diagnosis not present

## 2020-07-12 LAB — IGG, IGA, IGM
IgA: 57 mg/dL — ABNORMAL LOW (ref 64–422)
IgG (Immunoglobin G), Serum: 2522 mg/dL — ABNORMAL HIGH (ref 586–1602)
IgM (Immunoglobulin M), Srm: 13 mg/dL — ABNORMAL LOW (ref 26–217)

## 2020-07-12 LAB — KAPPA/LAMBDA LIGHT CHAINS
Kappa free light chain: 967.3 mg/L — ABNORMAL HIGH (ref 3.3–19.4)
Kappa, lambda light chain ratio: 124.01 — ABNORMAL HIGH (ref 0.26–1.65)
Lambda free light chains: 7.8 mg/L (ref 5.7–26.3)

## 2020-07-12 NOTE — Telephone Encounter (Signed)
Scheduled appt per 12/27 los- mailed letter with appt date and time

## 2020-07-13 ENCOUNTER — Telehealth: Payer: Self-pay | Admitting: Internal Medicine

## 2020-07-13 LAB — BETA 2 MICROGLOBULIN, SERUM: Beta-2 Microglobulin: 4.4 mg/L — ABNORMAL HIGH (ref 0.6–2.4)

## 2020-07-13 NOTE — Telephone Encounter (Signed)
Rescheduled follow-up appointments per 12/29 schedule message. Patient is aware of changes.

## 2020-07-14 DIAGNOSIS — I2510 Atherosclerotic heart disease of native coronary artery without angina pectoris: Secondary | ICD-10-CM | POA: Diagnosis not present

## 2020-07-14 DIAGNOSIS — N1832 Chronic kidney disease, stage 3b: Secondary | ICD-10-CM | POA: Diagnosis not present

## 2020-07-14 DIAGNOSIS — I1310 Hypertensive heart and chronic kidney disease without heart failure, with stage 1 through stage 4 chronic kidney disease, or unspecified chronic kidney disease: Secondary | ICD-10-CM | POA: Diagnosis not present

## 2020-07-14 DIAGNOSIS — G629 Polyneuropathy, unspecified: Secondary | ICD-10-CM | POA: Diagnosis not present

## 2020-07-14 DIAGNOSIS — D630 Anemia in neoplastic disease: Secondary | ICD-10-CM | POA: Diagnosis not present

## 2020-07-14 DIAGNOSIS — M545 Low back pain, unspecified: Secondary | ICD-10-CM | POA: Diagnosis not present

## 2020-07-14 DIAGNOSIS — C9000 Multiple myeloma not having achieved remission: Secondary | ICD-10-CM | POA: Diagnosis not present

## 2020-07-14 DIAGNOSIS — D631 Anemia in chronic kidney disease: Secondary | ICD-10-CM | POA: Diagnosis not present

## 2020-07-14 DIAGNOSIS — N179 Acute kidney failure, unspecified: Secondary | ICD-10-CM | POA: Diagnosis not present

## 2020-07-19 DIAGNOSIS — C9000 Multiple myeloma not having achieved remission: Secondary | ICD-10-CM | POA: Diagnosis not present

## 2020-07-19 DIAGNOSIS — I1310 Hypertensive heart and chronic kidney disease without heart failure, with stage 1 through stage 4 chronic kidney disease, or unspecified chronic kidney disease: Secondary | ICD-10-CM | POA: Diagnosis not present

## 2020-07-19 DIAGNOSIS — D630 Anemia in neoplastic disease: Secondary | ICD-10-CM | POA: Diagnosis not present

## 2020-07-19 DIAGNOSIS — M545 Low back pain, unspecified: Secondary | ICD-10-CM | POA: Diagnosis not present

## 2020-07-19 DIAGNOSIS — G629 Polyneuropathy, unspecified: Secondary | ICD-10-CM | POA: Diagnosis not present

## 2020-07-19 DIAGNOSIS — D631 Anemia in chronic kidney disease: Secondary | ICD-10-CM | POA: Diagnosis not present

## 2020-07-19 DIAGNOSIS — N179 Acute kidney failure, unspecified: Secondary | ICD-10-CM | POA: Diagnosis not present

## 2020-07-19 DIAGNOSIS — I2510 Atherosclerotic heart disease of native coronary artery without angina pectoris: Secondary | ICD-10-CM | POA: Diagnosis not present

## 2020-07-19 DIAGNOSIS — N1832 Chronic kidney disease, stage 3b: Secondary | ICD-10-CM | POA: Diagnosis not present

## 2020-07-20 DIAGNOSIS — E7800 Pure hypercholesterolemia, unspecified: Secondary | ICD-10-CM | POA: Diagnosis not present

## 2020-07-20 DIAGNOSIS — Z09 Encounter for follow-up examination after completed treatment for conditions other than malignant neoplasm: Secondary | ICD-10-CM | POA: Diagnosis not present

## 2020-07-20 DIAGNOSIS — I10 Essential (primary) hypertension: Secondary | ICD-10-CM | POA: Diagnosis not present

## 2020-07-20 DIAGNOSIS — E782 Mixed hyperlipidemia: Secondary | ICD-10-CM | POA: Diagnosis not present

## 2020-07-20 DIAGNOSIS — E559 Vitamin D deficiency, unspecified: Secondary | ICD-10-CM | POA: Diagnosis not present

## 2020-07-25 DIAGNOSIS — I1310 Hypertensive heart and chronic kidney disease without heart failure, with stage 1 through stage 4 chronic kidney disease, or unspecified chronic kidney disease: Secondary | ICD-10-CM | POA: Diagnosis not present

## 2020-07-25 DIAGNOSIS — N179 Acute kidney failure, unspecified: Secondary | ICD-10-CM | POA: Diagnosis not present

## 2020-07-25 DIAGNOSIS — N1832 Chronic kidney disease, stage 3b: Secondary | ICD-10-CM | POA: Diagnosis not present

## 2020-07-25 DIAGNOSIS — I2510 Atherosclerotic heart disease of native coronary artery without angina pectoris: Secondary | ICD-10-CM | POA: Diagnosis not present

## 2020-07-25 DIAGNOSIS — D631 Anemia in chronic kidney disease: Secondary | ICD-10-CM | POA: Diagnosis not present

## 2020-07-25 DIAGNOSIS — G629 Polyneuropathy, unspecified: Secondary | ICD-10-CM | POA: Diagnosis not present

## 2020-07-25 DIAGNOSIS — D630 Anemia in neoplastic disease: Secondary | ICD-10-CM | POA: Diagnosis not present

## 2020-07-25 DIAGNOSIS — C9000 Multiple myeloma not having achieved remission: Secondary | ICD-10-CM | POA: Diagnosis not present

## 2020-07-25 DIAGNOSIS — M545 Low back pain, unspecified: Secondary | ICD-10-CM | POA: Diagnosis not present

## 2020-08-03 DIAGNOSIS — I10 Essential (primary) hypertension: Secondary | ICD-10-CM | POA: Diagnosis not present

## 2020-08-03 DIAGNOSIS — E782 Mixed hyperlipidemia: Secondary | ICD-10-CM | POA: Diagnosis not present

## 2020-08-11 DIAGNOSIS — I2510 Atherosclerotic heart disease of native coronary artery without angina pectoris: Secondary | ICD-10-CM | POA: Diagnosis not present

## 2020-08-11 DIAGNOSIS — N1832 Chronic kidney disease, stage 3b: Secondary | ICD-10-CM | POA: Diagnosis not present

## 2020-08-11 DIAGNOSIS — G629 Polyneuropathy, unspecified: Secondary | ICD-10-CM | POA: Diagnosis not present

## 2020-08-11 DIAGNOSIS — D630 Anemia in neoplastic disease: Secondary | ICD-10-CM | POA: Diagnosis not present

## 2020-08-11 DIAGNOSIS — I1310 Hypertensive heart and chronic kidney disease without heart failure, with stage 1 through stage 4 chronic kidney disease, or unspecified chronic kidney disease: Secondary | ICD-10-CM | POA: Diagnosis not present

## 2020-08-11 DIAGNOSIS — M545 Low back pain, unspecified: Secondary | ICD-10-CM | POA: Diagnosis not present

## 2020-08-11 DIAGNOSIS — D631 Anemia in chronic kidney disease: Secondary | ICD-10-CM | POA: Diagnosis not present

## 2020-08-11 DIAGNOSIS — N179 Acute kidney failure, unspecified: Secondary | ICD-10-CM | POA: Diagnosis not present

## 2020-08-11 DIAGNOSIS — C9000 Multiple myeloma not having achieved remission: Secondary | ICD-10-CM | POA: Diagnosis not present

## 2020-08-19 DIAGNOSIS — I10 Essential (primary) hypertension: Secondary | ICD-10-CM | POA: Diagnosis not present

## 2020-08-19 DIAGNOSIS — N183 Chronic kidney disease, stage 3 unspecified: Secondary | ICD-10-CM | POA: Diagnosis not present

## 2020-08-19 LAB — BASIC METABOLIC PANEL
BUN/Creatinine Ratio: 14 (ref 12–28)
BUN: 24 mg/dL (ref 8–27)
CO2: 22 mmol/L (ref 20–29)
Calcium: 8.9 mg/dL (ref 8.7–10.3)
Chloride: 104 mmol/L (ref 96–106)
Creatinine, Ser: 1.7 mg/dL — ABNORMAL HIGH (ref 0.57–1.00)
GFR calc Af Amer: 31 mL/min/{1.73_m2} — ABNORMAL LOW (ref >59)
GFR calc non Af Amer: 27 mL/min/{1.73_m2} — ABNORMAL LOW (ref >59)
Glucose: 100 mg/dL — ABNORMAL HIGH (ref 65–99)
Potassium: 4 mmol/L (ref 3.5–5.2)
Sodium: 137 mmol/L (ref 134–144)

## 2020-08-22 ENCOUNTER — Other Ambulatory Visit: Payer: Self-pay

## 2020-08-22 ENCOUNTER — Encounter: Payer: Self-pay | Admitting: Internal Medicine

## 2020-08-22 ENCOUNTER — Ambulatory Visit: Payer: Medicare HMO | Admitting: Internal Medicine

## 2020-08-22 VITALS — BP 148/50 | HR 74 | Ht 67.0 in | Wt 197.0 lb

## 2020-08-22 DIAGNOSIS — I2510 Atherosclerotic heart disease of native coronary artery without angina pectoris: Secondary | ICD-10-CM | POA: Diagnosis not present

## 2020-08-22 DIAGNOSIS — E7800 Pure hypercholesterolemia, unspecified: Secondary | ICD-10-CM | POA: Diagnosis not present

## 2020-08-22 DIAGNOSIS — I6523 Occlusion and stenosis of bilateral carotid arteries: Secondary | ICD-10-CM

## 2020-08-22 DIAGNOSIS — Z951 Presence of aortocoronary bypass graft: Secondary | ICD-10-CM

## 2020-08-22 MED ORDER — EZETIMIBE 10 MG PO TABS: 10.0000 mg | ORAL_TABLET | Freq: Every day | ORAL | 3 refills | Status: DC

## 2020-08-22 NOTE — Progress Notes (Signed)
LIPID CLINIC CONSULT NOTE  Chief Complaint:  Manage dyslipidemia  Primary Care Physician: Tara Pretty, MD  Primary Cardiologist:  Tara Burow, MD  HPI:  Tara Potts is a 87 y.o. female who is being seen today for the evaluation of dyslipidemia at the request of Tara Pretty, MD.  This is a pleasant 87 year old female patient of Dr. Gwenlyn Potts with a history of coronary artery bypass grafting in 2007 who has been followed primarily by Dr. Gwenlyn Potts and recently seen by Tara Gess, PA-C.  She also has a history of carotid artery disease bilaterally.  Labs from Tara Potts recently showed total cholesterol 235, HDL of 52, triglycerides 72 and LDL 159.  She reports compliance with 80 mg atorvastatin daily but remains well above target LDL less than 70.  She reports a fairly balanced diet.  She says she does have some occasional sweets but is very surprised that her cholesterol is still high.  She also says she was never told that she was uncontrolled with regards to her cholesterol targets either by her PCP or Dr. Gwenlyn Potts.  08/22/2020  Tara Potts returns today for follow-up with her daughter.  She had lab work in January 2022 which showed total cholesterol 161, HDL 32, triglycerides 110 and LDL 109.  The LDL is improved from 159 on combination of high potency atorvastatin 80 mg daily and ezetimibe 10 mg daily.  She was recently hospitalized and mild was taken off of her med list however according to her daughter she still taking it.  Lipids have not of course reached her target LDL less than 70, however she is unwilling to take an injectable medication and additional options are limited.  PMHx:  Past Medical History:  Diagnosis Date  . Anemia   . Arthritis   . Asthma    as a child  . CAD (coronary artery disease)   . Carotid disease, bilateral (HCC)    mild  . Fibroid   . Hx of CABG 2007   [facility]  . Hyperlipemia   . Hypertension   . Menopausal symptoms   . Peripheral neuropathy    . Seizure-like activity [facility])     Past Surgical History:  Procedure Laterality Date  . CARDIAC CATHETERIZATION  01/09/2008   diffusely diseased graft to the PDA & diffuse PDA disease  . CAROTID DOPPLER  08/27/2011   mod. right & nild left ICA stenosis  . CATARACT SURGERY    . CORONARY ARTERY BYPASS GRAFT    . FOOT SURGERY    . JOINT REPLACEMENT     Right hip Dr. Alvan Dame 01-28-18   . MASS REMOVED FROM CERVIX  12/1997   Benign endocervical inclusion cysts  . NM MYOVIEW LTD  01/12/2011   no ischemia  . TOTAL HIP ARTHROPLASTY Right 01/28/2018   Procedure: RIGHT TOTAL HIP ARTHROPLASTY ANTERIOR APPROACH;  Surgeon: Paralee Cancel, MD;  Location: [facility] ORS;  Service: Orthopedics;  Laterality: Right;  70 mins  . TRIPLE HEART BYPASS  05/2006   [facility]  . US ECHOCARDIOGRAPHY  10/30/2007   mild MA,MR,TR,AOV mildly sclerotic,density oted in the prox asc aorta    FAMHx:  Family History  Problem Relation Age of Onset  . Diabetes Mother   . Hypertension Mother   . Heart disease Mother   . Heart failure Mother   . Hypertension Sister   . Breast cancer Maternal Aunt        Age 34  . Heart failure Maternal Aunt   .  Heart failure Maternal Uncle     SOCHx:   reports that she has quit smoking. She has never used smokeless tobacco. She reports that she does not drink alcohol and does not use drugs.  ALLERGIES:  Allergies  Allergen Reactions  . Codeine Nausea And Vomiting    confusion  . Cymbalta [Duloxetine Hcl] Nausea Only    ROS: Pertinent items noted in HPI and remainder of comprehensive ROS otherwise negative.  HOME MEDS: Current Outpatient Medications on File Prior to Visit  Medication Sig Dispense Refill  . Ascorbic Acid (VITAMIN C PO) Take 1 tablet by mouth daily.    [name] aspirin EC 81 MG tablet Take 81 mg by mouth daily.    [name] atorvastatin (LIPITOR) 80 MG tablet Take 80 mg by mouth daily.    . Calcium-Magnesium-Vitamin D (CALCIUM MAGNESIUM PO) Take 1 tablet by mouth daily.      . Cholecalciferol (VITAMIN D) 2000 units tablet Take 2,000 Units by mouth daily.    . Coenzyme Q10 (COQ10 PO) Take 1 capsule by mouth daily.    . furosemide (LASIX) 40 MG tablet Take 1 tablet (40 mg total) by mouth daily. Please weight yourself daily, check your legs daily, if your weight go up by 3lbs in three days and legs are swollen, take lasix 80mg   in the morning and call your doctor 180 tablet 3  . GARLIC PO Take 1 tablet by mouth 2 (two) times daily.     . Glucosamine HCl (GLUCOSAMINE PO) Take 1 tablet by mouth daily.    . hydrALAZINE (APRESOLINE) 25 MG tablet TAKE 1 TABLET (25 MG TOTAL) BY MOUTH IN THE MORNING AND AT BEDTIME. 180 tablet 1  . metoprolol tartrate (LOPRESSOR) 25 MG tablet Take 3 tablets (75 mg total) by mouth 2 (two) times daily. 180 tablet 0  . potassium chloride (KLOR-CON) 10 MEQ tablet TAKE 1 TABLET(10 MEQ) BY MOUTH DAILY (Patient taking differently: Take 10 mEq by mouth daily. TAKE 1 TABLET(10 MEQ) BY MOUTH DAILY) 90 tablet 3   No current facility-administered medications on file prior to visit.    LABS/IMAGING: No results Potts for this or any previous visit (from the past 48 hour(s)). No results Potts.  LIPID PANEL:    Component Value Date/Time   CHOL 218 (H) 05/21/2019 1205   TRIG 85 05/21/2019 1205   HDL 57 05/21/2019 1205   CHOLHDL 3.8 05/21/2019 1205   LDLCALC 146 (H) 05/21/2019 1205    WEIGHTS: Wt Readings from Last 3 Encounters:  08/22/20 197 lb (89.4 kg)  07/11/20 197 lb 9.6 oz (89.6 kg)  06/29/20 195 lb (88.5 kg)    VITALS: BP (!) 148/50 (BP Location: Left Arm, Patient Position: Sitting, Cuff Size: Normal)   Pulse 74   Ht 5\' 7"  (1.702 m)   Wt 197 lb (89.4 kg)   LMP 07/16/2002   BMI 30.85 kg/m   EXAM: Deferred  EKG: Deferred  ASSESSMENT: 1. Mixed dyslipidemia, goal LDL less than 70 2. Coronary artery disease status post CABG 2007 3. Bilateral carotid artery disease  PLAN: 1.   Tara Potts seems to be tolerating atorvastatin  and ezetimibe.  For some reason it fell off her med list at her recent hospitalization.  We will make sure that is reordered and according to her daughter she still taking it.  Have encouraged continue to work with diet and exercise.  She is not interested in additional medications such as the PCSK9 inhibitors therefore her lipids are probably as good  as were going to be able to achieve.  I would encourage follow-up with the Dr. Gwenlyn Potts and I am happy to see her on an as-needed basis peer  Pixie Casino, MD, [facility], [place] Director of the Advanced Lipid Disorders &  Cardiovascular Risk Reduction Clinic Diplomate of the American Board of Clinical Lipidology Attending Cardiologist  Direct Dial: [phone]  Fax: [phone]  Website:  [email or web address]  Nadean Corwin Hilty 08/22/2020, 2:28 PM

## 2020-08-22 NOTE — Patient Instructions (Signed)
Medication Instructions:  Continue Zetia 10mg  daily  *If you need a refill on your cardiac medications before your next appointment, please call your pharmacy*   Follow-Up: At [facility], you and your health needs are our priority.  As part of our continuing mission to provide you with exceptional heart care, we have created designated Provider Care Teams.  These Care Teams include your primary Cardiologist (physician) and Advanced Practice Providers (APPs -  Physician Assistants and Nurse Practitioners) who all work together to provide you with the care you need, when you need it.  We recommend signing up for the patient portal called "MyChart".  Sign up information is provided on this After Visit Summary.  MyChart is used to connect with patients for Virtual Visits (Telemedicine).  Patients are able to view lab/test results, encounter notes, upcoming appointments, etc.  Non-urgent messages can be sent to your provider as well.   To learn more about what you can do with MyChart, go to [email or web address].    Your next appointment:   AS NEEDED with Dr. Debara Pickett for lipid management

## 2020-08-26 ENCOUNTER — Encounter: Payer: Self-pay | Admitting: Cardiovascular Disease

## 2020-08-26 ENCOUNTER — Ambulatory Visit: Payer: Medicare HMO | Admitting: Cardiovascular Disease

## 2020-08-26 ENCOUNTER — Other Ambulatory Visit: Payer: Self-pay

## 2020-08-26 VITALS — BP 160/78 | HR 68 | Ht 66.0 in | Wt 200.4 lb

## 2020-08-26 DIAGNOSIS — Z951 Presence of aortocoronary bypass graft: Secondary | ICD-10-CM

## 2020-08-26 DIAGNOSIS — I6523 Occlusion and stenosis of bilateral carotid arteries: Secondary | ICD-10-CM | POA: Diagnosis not present

## 2020-08-26 DIAGNOSIS — I10 Essential (primary) hypertension: Secondary | ICD-10-CM

## 2020-08-26 DIAGNOSIS — I739 Peripheral vascular disease, unspecified: Secondary | ICD-10-CM | POA: Diagnosis not present

## 2020-08-26 DIAGNOSIS — E785 Hyperlipidemia, unspecified: Secondary | ICD-10-CM | POA: Diagnosis not present

## 2020-08-26 DIAGNOSIS — R600 Localized edema: Secondary | ICD-10-CM

## 2020-08-26 NOTE — Assessment & Plan Note (Signed)
History of essential hypertension a blood pressure measured today 160/78.  She has not taken her midday hydralazine today.  She is on metoprolol and hydralazine.

## 2020-08-26 NOTE — Assessment & Plan Note (Signed)
History of bilateral ICA stenosis by duplex ultrasound performed 04/26/2020 in the moderate range right greater than left.  This will be repeated on annual basis.

## 2020-08-26 NOTE — Assessment & Plan Note (Signed)
Chronic bilateral lower extremity edema probably related to venous insufficiency on furosemide.  She was begun on spironolactone by her PCP which led to renal failure and successfully been discontinued.

## 2020-08-26 NOTE — Patient Instructions (Addendum)
Medication Instructions:  Your physician recommends that you continue on your current medications as directed. Please refer to the Current Medication list given to you today.  *If you need a refill on your cardiac medications before your next appointment, please call your pharmacy*  Testing: Your physician has requested that you have a carotid duplex in December 2022. This test is an ultrasound of the carotid arteries in your neck. It looks at blood flow through these arteries that supply the brain with blood. Allow one hour for this exam. There are no restrictions or special instructions.  Follow-Up: At [facility], you and your health needs are our priority.  As part of our continuing mission to provide you with exceptional heart care, we have created designated Provider Care Teams.  These Care Teams include your primary Cardiologist (physician) and Advanced Practice Providers (APPs -  Physician Assistants and Nurse Practitioners) who all work together to provide you with the care you need, when you need it.  We recommend signing up for the patient portal called "MyChart".  Sign up information is provided on this After Visit Summary.  MyChart is used to connect with patients for Virtual Visits (Telemedicine).  Patients are able to view lab/test results, encounter notes, upcoming appointments, etc.  Non-urgent messages can be sent to your provider as well.   To learn more about what you can do with MyChart, go to [email or web address].    Your next appointment:   6 month(s)  The format for your next appointment:   In Person  Provider:   You will see one of the following Advanced Practice Providers on your designated Care Team:    Kerin Ransom, PA-C  [place], [place]  Coletta Memos, [place]  Then, Quay Burow, MD will plan to see you again in 12 month(s).

## 2020-08-26 NOTE — Assessment & Plan Note (Signed)
History of dyslipidemia on statin therapy high-dose and Zetia followed by Dr. Debara Pickett and help in the lipid clinic.  Her most recent lipid profile performed 07/20/2020 revealed total cholesterol 161, LDL 109 and HDL 32, improved from her prior reading.  She is does not wish to pursue PCSK9 injectables.

## 2020-08-26 NOTE — Assessment & Plan Note (Signed)
History of CAD status post CABG at [facility] November 2007.  She denies chest pain or shortness of breath.  Cardiac cath performed 01/09/2008 revealed diffusely diseased graft to the PDA as well as a diffuse PDA disease.  I elected to treat her medically at that time

## 2020-08-26 NOTE — Progress Notes (Signed)
08/26/2020 Tara Potts   [birth year]  [id number]  Primary Physician [name], MD Primary Cardiologist: Lorretta Harp MD FACP, [place], [place], [place]  HPI:  Tara Potts is a 87 y.o.  mildly overweight, widowed Tara Potts, mother of 2, grandmother to 1 grandchild who I saw in the 05/24/2020. Her husband Tara Potts was also a patient of mine and is deceased.  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  She has a history of CAD status post coronary artery bypass grafting November 2007 at [facility]. Her other problems include hypertension, hyperlipidemia and mild carotid disease. She had a Myoview stress test performed January 12, 2011, which was nonischemic. Carotid Dopplers performed August 27, 2011, showed moderate right and mild left ICA stenosis scheduled to be redone in February of next year. I last cath'd her January 09, 2008, revealing diffusely diseased graft to the PDA as well as diffuse PDA disease and I elected to treat her medically at that time. Her major complaints are nocturnal leg cramps. Since I saw her a year ago she has remained asymptomatic denying chest pain or shortness of breath.  She had a right total hip replacement by Dr.Olinin July2019which she has recuperated from nicely.She is currently walking with a cane because of left hip pain.  She still walking with a cane and is never fully recovered from her right total hip replacement. She complains of chronic bilateral leg pain but does have intact pedal pulses.   Since I saw her 3 months ago she was hospitalized briefly in early December with renal insufficiency related to initiating spironolactone for lower extreme edema by her PCP.  She also had a UTI.  She was hydrated, treated with antibiotics.  She feels clinically improved.  She continues to walk with a cane because of right hip pain now 3 years status post right total hip replacement by Dr. Alvan Potts .    Current Meds   Medication Sig  . Ascorbic Acid (VITAMIN C PO) Take 1 tablet by mouth daily.  [name] aspirin EC 81 MG tablet Take 81 mg by mouth daily.  [name] atorvastatin (LIPITOR) 80 MG tablet Take 80 mg by mouth daily.  . Calcium-Magnesium-Vitamin D (CALCIUM MAGNESIUM PO) Take 1 tablet by mouth daily.   . Cholecalciferol (VITAMIN D) 2000 units tablet Take 2,000 Units by mouth daily.  . Coenzyme Q10 (COQ10 PO) Take 1 capsule by mouth daily.  [name] ezetimibe (ZETIA) 10 MG tablet Take 1 tablet (10 mg total) by mouth daily.  . furosemide (LASIX) 40 MG tablet Take 1 tablet (40 mg total) by mouth daily. Please weight yourself daily, check your legs daily, if your weight go up by 3lbs in three days and legs are swollen, take lasix 80mg   in the morning and call your doctor  . GARLIC PO Take 1 tablet by mouth 2 (two) times daily.   . Glucosamine HCl (GLUCOSAMINE PO) Take 1 tablet by mouth daily.  . hydrALAZINE (APRESOLINE) 25 MG tablet TAKE 1 TABLET (25 MG TOTAL) BY MOUTH IN THE MORNING AND AT BEDTIME.  . metoprolol tartrate (LOPRESSOR) 25 MG tablet Take 3 tablets (75 mg total) by mouth 2 (two) times daily.  . potassium chloride (KLOR-CON) 10 MEQ tablet TAKE 1 TABLET(10 MEQ) BY MOUTH DAILY (Patient taking differently: Take 10 mEq by mouth daily. TAKE 1 TABLET(10 MEQ) BY MOUTH DAILY)     Allergies  Allergen Reactions  . Codeine Nausea And  Vomiting    confusion  . Cymbalta [Duloxetine Hcl] Nausea Only    Social History   Socioeconomic History  . Marital status: Widowed    Spouse name: Not on file  . Number of children: 1  . Years of education: college  . Highest education level: Not on file  Occupational History  . Not on file  Tobacco Use  . Smoking status: Former Research scientist (life sciences)  . Smokeless tobacco: Never Used  . Tobacco comment: Quit 30 years ago  Vaping Use  . Vaping Use: Never used  Substance and Sexual Activity  . Alcohol use: No    Alcohol/week: 0.0 standard drinks  . Drug use: No  . Sexual activity: Not  Currently    Birth control/protection: Post-menopausal    Comment: intercourse age 72, more than 5 sexual partners  Other Topics Concern  . Not on file  Social History Narrative   Patient lives at home and her daughter and son in law live with her. Widowed.   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   Education college.   Left handed.   Caffeine     Social Determinants of Health   Financial Resource Strain: Not on file  Food Insecurity: Not on file  Transportation Needs: Not on file  Physical Activity: Not on file  Stress: Not on file  Social Connections: Not on file  Intimate Partner Violence: Not on file     Review of Systems: General: negative for chills, fever, night sweats or weight changes.  Cardiovascular: negative for chest pain, dyspnea on exertion, edema, orthopnea, palpitations, paroxysmal nocturnal dyspnea or shortness of breath Dermatological: negative for rash Respiratory: negative for cough or wheezing Urologic: negative for hematuria Abdominal: negative for nausea, vomiting, diarrhea, bright red blood per rectum, melena, or hematemesis Neurologic: negative for visual changes, syncope, or dizziness All other systems reviewed and are otherwise negative except as noted above.    Blood pressure (!) 160/78, pulse 68, height 5\' 6"  (1.676 m), weight 200 lb 6.4 oz (90.9 kg), last menstrual period 07/16/2002, SpO2 98 %.  General appearance: alert and no distress Neck: no adenopathy, no carotid bruit, no JVD, supple, symmetrical, trachea midline and thyroid not enlarged, symmetric, no tenderness/mass/nodules Lungs: clear to auscultation bilaterally Heart: regular rate and rhythm, S1, S2 normal, no murmur, click, rub or gallop Extremities: 1-2+ pitting edema bilaterally Pulses: 2+ and symmetric Skin: Skin color, texture, turgor normal. No rashes or lesions Neurologic: Alert and oriented X 3, normal strength and tone. Normal symmetric reflexes. Normal coordination and  gait  EKG not performed today  ASSESSMENT AND PLAN:   Essential hypertension History of essential hypertension a blood pressure measured today 160/78.  She has not taken her midday hydralazine today.  She is on metoprolol and hydralazine.  Dyslipidemia, goal LDL below 70 History of dyslipidemia on statin therapy high-dose and Zetia followed by Dr. Debara Pickett and help in the lipid clinic.  Her most recent lipid profile performed 07/20/2020 revealed total cholesterol 161, LDL 109 and HDL 32, improved from her prior reading.  She is does not wish to pursue PCSK9 injectables.  Hx of CABG History of CAD status post CABG at [facility] November 2007.  She denies chest pain or shortness of breath.  Cardiac cath performed 01/09/2008 revealed diffusely diseased graft to the PDA as well as a diffuse PDA disease.  I elected to treat her medically at that time  Peripheral vascular disease:  Moderate right and mild left ICA stenosis.  History of bilateral ICA stenosis by duplex ultrasound performed 04/26/2020 in the moderate range right greater than left.  This will be repeated on annual basis.  Bilateral lower extremity edema Chronic bilateral lower extremity edema probably related to venous insufficiency on furosemide.  She was begun on spironolactone by her PCP which led to renal failure and successfully been discontinued.      Lorretta Harp MD FACP,FACC,FAHA, [facility] 08/26/2020 3:52 PM

## 2020-09-26 ENCOUNTER — Inpatient Hospital Stay: Payer: Medicare HMO | Attending: Internal Medicine

## 2020-09-26 DIAGNOSIS — D649 Anemia, unspecified: Secondary | ICD-10-CM | POA: Insufficient documentation

## 2020-09-26 DIAGNOSIS — C9000 Multiple myeloma not having achieved remission: Secondary | ICD-10-CM | POA: Insufficient documentation

## 2020-10-03 ENCOUNTER — Inpatient Hospital Stay (HOSPITAL_BASED_OUTPATIENT_CLINIC_OR_DEPARTMENT_OTHER): Payer: Medicare HMO | Admitting: Internal Medicine

## 2020-10-03 ENCOUNTER — Encounter: Payer: Self-pay | Admitting: Internal Medicine

## 2020-10-03 ENCOUNTER — Other Ambulatory Visit: Payer: Self-pay

## 2020-10-03 VITALS — BP 144/46 | HR 54 | Temp 98.6°F | Resp 11 | Ht 67.0 in | Wt 199.1 lb

## 2020-10-03 DIAGNOSIS — I10 Essential (primary) hypertension: Secondary | ICD-10-CM | POA: Diagnosis not present

## 2020-10-03 DIAGNOSIS — C9000 Multiple myeloma not having achieved remission: Secondary | ICD-10-CM

## 2020-10-03 DIAGNOSIS — D649 Anemia, unspecified: Secondary | ICD-10-CM | POA: Diagnosis not present

## 2020-10-03 NOTE — Progress Notes (Signed)
[place] [phone]   Fax:([phone]) [place], [place] [address]  DIAGNOSIS: Plasma cell neoplasm diagnosed in October 2019.  PRIOR THERAPY: None  CURRENT THERAPY: Observation.  INTERVAL HISTORY: Tara Potts 87 y.o. female returns to the clinic today for follow-up visit accompanied by her daughter.  The patient has no complaints today except for fatigue.  She denied having any current chest pain, shortness of breath, cough or hemoptysis.  She denied having any nausea, vomiting, diarrhea or constipation.  She has no bleeding, bruises or ecchymosis.  She was seen 3 months ago and repeat myeloma panel showed evidence for disease progression.  The patient came today for evaluation and she was supposed to have repeat myeloma panel for close monitoring of her condition but she did not have the lab done as ordered.  The patient and her daughter are still confused about her visits and her condition.  She has been under the impression that she is seen mainly for anemia.  MEDICAL HISTORY: Past Medical History:  Diagnosis Date  . Anemia   . Arthritis   . Asthma    as a child  . CAD (coronary artery disease)   . Carotid disease, bilateral (HCC)    mild  . Fibroid   . Hx of CABG 2007   [facility]  . Hyperlipemia   . Hypertension   . Menopausal symptoms   . Peripheral neuropathy   . Seizure-like activity (HCC)     ALLERGIES:  is allergic to codeine, cymbalta [duloxetine hcl], ezetimibe, and gabapentin.  MEDICATIONS:  Current Outpatient Medications  Medication Sig Dispense Refill  . aspirin EC 81 MG tablet Take 81 mg by mouth daily.    . furosemide (LASIX) 40 MG tablet Take 1 tablet (40 mg total) by mouth daily. Please weight yourself daily, check your legs daily, if your weight go up by 3lbs in three days and legs are swollen, take lasix 61m  in the morning and call your  doctor 180 tablet 3  . Glucosamine HCl (GLUCOSAMINE PO) Take 1 tablet by mouth daily.    . hydrALAZINE (APRESOLINE) 25 MG tablet TAKE 1 TABLET (25 MG TOTAL) BY MOUTH IN THE MORNING AND AT BEDTIME. 180 tablet 1  . metoprolol tartrate (LOPRESSOR) 25 MG tablet Take 3 tablets (75 mg total) by mouth 2 (two) times daily. 180 tablet 0  . potassium chloride (KLOR-CON) 10 MEQ tablet TAKE 1 TABLET(10 MEQ) BY MOUTH DAILY (Patient taking differently: Take 10 mEq by mouth daily. TAKE 1 TABLET(10 MEQ) BY MOUTH DAILY) 90 tablet 3  . atorvastatin (LIPITOR) 80 MG tablet Take 80 mg by mouth daily. (Patient not taking: Reported on 10/03/2020)     No current facility-administered medications for this visit.    SURGICAL HISTORY:  Past Surgical History:  Procedure Laterality Date  . CARDIAC CATHETERIZATION  01/09/2008   diffusely diseased graft to the PDA & diffuse PDA disease  . CAROTID DOPPLER  08/27/2011   mod. right & nild left ICA stenosis  . CATARACT SURGERY    . CORONARY ARTERY BYPASS GRAFT    . FOOT SURGERY    . JOINT REPLACEMENT     Right hip Dr. OAlvan Dame7-16-19   . MASS REMOVED FROM CERVIX  12/1997   Benign endocervical inclusion cysts  . NM MYOVIEW LTD  01/12/2011   no ischemia  . TOTAL HIP ARTHROPLASTY Right 01/28/2018  Procedure: RIGHT TOTAL HIP ARTHROPLASTY ANTERIOR APPROACH;  Surgeon: Paralee Cancel, MD;  Location: [facility];  Service: Orthopedics;  Laterality: Right;  70 mins  . TRIPLE HEART BYPASS  05/2006   [facility]  . US ECHOCARDIOGRAPHY  10/30/2007   mild MA,MR,TR,AOV mildly sclerotic,density oted in the prox asc aorta    REVIEW OF SYSTEMS:  A comprehensive review of systems was negative except for: Constitutional: positive for fatigue   PHYSICAL EXAMINATION: General appearance: alert, cooperative, fatigued and no distress Head: Normocephalic, without obvious abnormality, atraumatic Neck: no adenopathy, no JVD, supple, symmetrical, trachea midline and thyroid not enlarged, symmetric,  no tenderness/mass/nodules Lymph nodes: Cervical, supraclavicular, and axillary nodes normal. Resp: clear to auscultation bilaterally Back: symmetric, no curvature. ROM normal. No CVA tenderness. Cardio: regular rate and rhythm, S1, S2 normal, no murmur, click, rub or gallop GI: soft, non-tender; bowel sounds normal; no masses,  no organomegaly Extremities: extremities normal, atraumatic, no cyanosis or edema  ECOG PERFORMANCE STATUS: 1 - Symptomatic but completely ambulatory  Blood pressure (!) 144/46, pulse (!) 54, temperature 98.6 F (37 C), temperature source Tympanic, resp. rate 11, height _0  (1.702 m), weight 199 lb 1.6 oz (90.3 kg), last menstrual period 07/16/2002, SpO2 100 %.  LABORATORY DATA: Lab Results  Component Value Date   WBC 5.7 07/11/2020   HGB 9.1 (L) 07/11/2020   HCT 28.1 (L) 07/11/2020   MCV 82.4 07/11/2020   PLT 239 07/11/2020      Chemistry      Component Value Date/Time   NA 137 08/19/2020 1046   NA 138 07/03/2017 0928   K 4.0 08/19/2020 1046   K 4.6 07/03/2017 0928   CL 104 08/19/2020 1046   CO2 22 08/19/2020 1046   CO2 29 07/03/2017 0928   BUN 24 08/19/2020 1046   BUN 25.5 07/03/2017 0928   CREATININE 1.70 (H) 08/19/2020 1046   CREATININE 1.98 (H) 07/11/2020 1500   CREATININE 1.5 (H) 07/03/2017 0928      Component Value Date/Time   CALCIUM 8.9 08/19/2020 1046   CALCIUM 9.4 07/03/2017 0928   ALKPHOS 65 07/11/2020 1500   ALKPHOS 72 07/03/2017 0928   AST 41 07/11/2020 1500   AST 25 07/03/2017 0928   ALT 36 07/11/2020 1500   ALT 17 07/03/2017 0928   BILITOT 0.6 07/11/2020 1500   BILITOT 0.51 07/03/2017 0928       RADIOGRAPHIC STUDIES: No results found.  ASSESSMENT AND PLAN:  This is a very pleasant 87 years old white female with plasma cell neoplasm, multiple myeloma.  The patient has been in observation for several years since she declined any treatment.  She was supposed to come back in May 2020 but she was lost to follow-up  She  finally presented in December 2021 and repeat myeloma panel showed further increase in her protein study.  The patient has been reluctant about any treatment.  I recommended for her to have repeat myeloma panel in the next few days. I also discussed with the patient and her daughter her current condition and give them information about multiple myeloma and possible treatment options. I strongly recommend for the patient to consider treatment with subcutaneous Velcade and Decadron to control her disease but the patient and her daughter are in denial about her condition and they thought that they had been seen for anemia. I explained to the patient and her daughter that her anemia and the renal insufficiency are likely related to her multiple myeloma and hopefully treatment of  her condition may improve some of these issues. I provided them with information and handout about multiple myeloma, Velcade and Decadron. If the patient decides to proceed with the treatment, her daughter will call next week otherwise I will see her back for follow-up visit in 3 months with repeat myeloma panel for close monitoring of her condition. The patient was advised to call immediately if she has any other concerning symptoms in the interval. The patient voices understanding of current disease status and treatment options and is in agreement with the current care plan.  All questions were answered. The patient knows to call the clinic with any problems, questions or concerns. We can certainly see the patient much sooner if necessary.  Disclaimer: This note was dictated with voice recognition software. Similar sounding words can inadvertently be transcribed and may not be corrected upon review.

## 2020-10-03 NOTE — Patient Instructions (Signed)
Dexamethasone tablets What is this medicine? DEXAMETHASONE (dex a METH a sone) is a corticosteroid. It is commonly used to treat inflammation of the skin, joints, lungs, and other organs. Common conditions treated include asthma, allergies, and arthritis. It is also used for other conditions, such as blood disorders and diseases of the adrenal glands. This medicine may be used for other purposes; ask your health care provider or pharmacist if you have questions. COMMON BRAND NAME(S): [employer]NO[employer], Decadron, Dexabliss, DexPak Jr TaperPak, DexPak TaperPak, Dxevo, Hemady, HiDex, TaperDex, ZCORT, Zema-Pak, ZoDex, ZonaCort 11 Day, ZonaCort 7 Day What should I tell my health care provider before I take this medicine? They need to know if you have any of these conditions:  Cushing's syndrome  diabetes  glaucoma  heart disease  high blood pressure  infection like herpes, measles, tuberculosis, or chickenpox  kidney disease  liver disease  mental illness  myasthenia gravis  osteoporosis  previous heart attack  seizures  stomach or intestine problems  thyroid disease  an unusual or allergic reaction to dexamethasone, corticosteroids, other medicines, lactose, foods, dyes, or preservatives  pregnant or trying to get pregnant  breast-feeding How should I use this medicine? Take this medicine by mouth with a drink of water. Follow the directions on the prescription label. Take it with food or milk to avoid stomach upset. If you are taking this medicine once a day, take it in the morning. Do not take more medicine than you are told to take. Do not suddenly stop taking your medicine because you may develop a severe reaction. Your doctor will tell you how much medicine to take. If your doctor wants you to stop the medicine, the dose may be slowly lowered over time to avoid any side effects. Talk to your pediatrician regarding the use of this medicine in children. Special  care may be needed. Patients over 22 years old may have a stronger reaction and need a smaller dose. Overdosage: If you think you have taken too much of this medicine contact a poison control center or emergency room at once. NOTE: This medicine is only for you. Do not share this medicine with others. What if I miss a dose? If you miss a dose, take it as soon as you can. If it is almost time for your next dose, talk to your doctor or health care professional. You may need to miss a dose or take an extra dose. Do not take double or extra doses without advice. What may interact with this medicine? Do not take this medicine with any of the following medications:  live virus vaccines This medicine may also interact with the following medications:  aminoglutethimide  amphotericin B  aspirin and aspirin-like medicines  certain antibiotics like erythromycin, clarithromycin, and troleandomycin  certain antivirals for HIV or hepatitis  certain medicines for seizures like carbamazepine, phenobarbital, phenytoin  certain medicines to treat myasthenia gravis  cholestyramine  cyclosporine  digoxin  diuretics  ephedrine  female hormones, like estrogen or progestins and birth control pills  insulin or other medicines for diabetes  isoniazid  ketoconazole  medicines that relax muscles for surgery  mifepristone  NSAIDs, medicines for pain and inflammation, like ibuprofen or naproxen  rifampin  skin tests for allergies  thalidomide  vaccines  warfarin This list may not describe all possible interactions. Give your health care provider a list of all the medicines, herbs, non-prescription drugs, or dietary supplements you use. Also tell them if you smoke, drink alcohol, or  use illegal drugs. Some items may interact with your medicine. What should I watch for while using this medicine? Visit your health care professional for regular checks on your progress. Tell your health  care professional if your symptoms do not start to get better or if they get worse. Your condition will be monitored carefully while you are receiving this medicine. Wear a medical ID bracelet or chain. Carry a card that describes your disease and details of your medicine and dosage times. This medicine may increase your risk of getting an infection. Call your health care professional for advice if you get a fever, chills, or sore throat, or other symptoms of a cold or flu. Do not treat yourself. Try to avoid being around people who are sick. Call your health care professional if you are around anyone with measles, chickenpox, or if you develop sores or blisters that do not heal properly. If you are going to need surgery or other procedures, tell your doctor or health care professional that you have taken this medicine within the last 12 months. Ask your doctor or health care professional about your diet. You may need to lower the amount of salt you eat. This medicine may increase blood sugar. Ask your healthcare provider if changes in diet or medicines are needed if you have diabetes. What side effects may I notice from receiving this medicine? Side effects that you should report to your doctor or health care professional as soon as possible:  allergic reactions like skin rash, itching or hives, swelling of the face, lips, or tongue  bloody or black, tarry stools  changes in emotions or moods  changes in vision  confusion, excitement, restlessness  depressed mood  eye pain  hallucinations  fever or chills, cough, sore throat, pain or difficulty passing urine  muscle weakness  severe or sudden stomach or belly pain  signs and symptoms of high blood sugar such as being more thirsty or hungry or having to urinate more than normal. You may also feel very tired or have blurry vision.  signs and symptoms of infection like fever; chills; cough; sore throat; pain or trouble passing  urine  swelling of ankles, feet  unusual bruising or bleeding  wounds that do not heal Side effects that usually do not require medical attention (report to your doctor or health care professional if they continue or are bothersome):  increased appetite  increased growth of face or body hair  headache  nausea, vomiting  skin problems, acne, thin and shiny skin  trouble sleeping  weight gain This list may not describe all possible side effects. Call your doctor for medical advice about side effects. You may report side effects to FDA at 1-800-FDA-1088. Where should I keep my medicine? Keep out of the reach of children. Store at room temperature between 20 and 25 degrees C (68 and 77 degrees F). Protect from light. Throw away any unused medicine after the expiration date. NOTE: This sheet is a summary. It may not cover all possible information. If you have questions about this medicine, talk to your doctor, pharmacist, or health care provider.  2021 Elsevier/Gold Standard (2019-01-13 14:23:34) Bortezomib injection What is this medicine? BORTEZOMIB (bor TEZ oh mib) targets proteins in cancer cells and stops the cancer cells from growing. It treats multiple myeloma and mantle cell lymphoma. This medicine may be used for other purposes; ask your health care provider or pharmacist if you have questions. COMMON BRAND NAME(S): Velcade What should I tell  my health care provider before I take this medicine? They need to know if you have any of these conditions:  dehydration  diabetes (high blood sugar)  heart disease  liver disease  tingling of the fingers or toes or other nerve disorder  an unusual or allergic reaction to bortezomib, mannitol, boron, other medicines, foods, dyes, or preservatives  pregnant or trying to get pregnant  breast-feeding How should I use this medicine? This medicine is injected into a vein or under the skin. It is given by a health care provider  in a hospital or clinic setting. Talk to your health care provider about the use of this medicine in children. Special care may be needed. Overdosage: If you think you have taken too much of this medicine contact a poison control center or emergency room at once. NOTE: This medicine is only for you. Do not share this medicine with others. What if I miss a dose? Keep appointments for follow-up doses. It is important not to miss your dose. Call your health care provider if you are unable to keep an appointment. What may interact with this medicine? This medicine may interact with the following medications:  ketoconazole  rifampin This list may not describe all possible interactions. Give your health care provider a list of all the medicines, herbs, non-prescription drugs, or dietary supplements you use. Also tell them if you smoke, drink alcohol, or use illegal drugs. Some items may interact with your medicine. What should I watch for while using this medicine? Your condition will be monitored carefully while you are receiving this medicine. You may need blood work done while you are taking this medicine. You may get drowsy or dizzy. Do not drive, use machinery, or do anything that needs mental alertness until you know how this medicine affects you. Do not stand up or sit up quickly, especially if you are an older patient. This reduces the risk of dizzy or fainting spells This medicine may increase your risk of getting an infection. Call your health care provider for advice if you get a fever, chills, sore throat, or other symptoms of a cold or flu. Do not treat yourself. Try to avoid being around people who are sick. Check with your health care provider if you have severe diarrhea, nausea, and vomiting, or if you sweat a lot. The loss of too much body fluid may make it dangerous for you to take this medicine. Do not become pregnant while taking this medicine or for 7 months after stopping it. Women  should inform their health care provider if they wish to become pregnant or think they might be pregnant. Men should not father a child while taking this medicine and for 4 months after stopping it. There is a potential for serious harm to an unborn child. Talk to your health care provider for more information. Do not breast-feed an infant while taking this medicine or for 2 months after stopping it. This medicine may make it more difficult to get pregnant or father a child. Talk to your health care provider if you are concerned about your fertility. What side effects may I notice from receiving this medicine? Side effects that you should report to your doctor or health care professional as soon as possible:  allergic reactions (skin rash; itching or hives; swelling of the face, lips, or tongue)  bleeding (bloody or black, tarry stools; red or dark brown urine; spitting up blood or brown material that looks like coffee grounds; red spots  on the skin; unusual bruising or bleeding from the eye, gums, or nose)  blurred vision or changes in vision  confusion  constipation  headache  heart failure (trouble breathing; fast, irregular heartbeat; sudden weight gain; swelling of the ankles, feet, hands)  infection (fever, chills, cough, sore throat, pain or trouble passing urine)  lack or loss of appetite  liver injury (dark yellow or brown urine; general ill feeling or flu-like symptoms; loss of appetite, right upper belly pain; yellowing of the eyes or skin)  low blood pressure (dizziness; feeling faint or lightheaded, falls; unusually weak or tired)  muscle cramps  pain, redness, or irritation at site where injected  pain, tingling, numbness in the hands or feet  seizures  trouble breathing  unusual bruising or bleeding Side effects that usually do not require medical attention (report to your doctor or health care professional if they continue or are  bothersome):  diarrhea  nausea  stomach pain  trouble sleeping  vomiting This list may not describe all possible side effects. Call your doctor for medical advice about side effects. You may report side effects to FDA at 1-800-FDA-1088. Where should I keep my medicine? This medicine is given in a hospital or clinic. It will not be stored at home. NOTE: This sheet is a summary. It may not cover all possible information. If you have questions about this medicine, talk to your doctor, pharmacist, or health care provider.  2021 Elsevier/Gold Standard (2020-06-23 13:22:53) Multiple Myeloma  Multiple myeloma is a form of cancer. It develops when abnormal plasma cells grow out of control. Plasma cells are a type of white blood cell that is made in the soft tissue inside the bones (bone marrow). They are part of the body's disease-fighting system (immune system). Multiple myeloma damages bones and causes other health problems because of its effect on blood cells. Abnormal plasma cells produce monoclonal proteins (M proteins) and interfere with many important functions that normal cells perform in the body. The disease gets worse over time (progresses) and reduces the body's ability to fight infections. What are the causes? The cause of multiple myeloma is not known. What increases the risk? You are more likely to develop this condition if you:  Are older than age 7.  Are female.  Are African American.  Have a family history of multiple myeloma.  Have a history of monoclonal gammopathy of undetermined significance (MGUS).  Have a history of radiation exposure.  Have been exposed to certain chemicals, such as benzene or pesticides. What are the signs or symptoms? Signs and symptoms of multiple myeloma may include:  Bone pain, especially in the back, ribs, and hips.  Broken bones (fractures).  Having a low level of red blood cells (anemia), white blood cells (leukopenia), and  platelets (thrombocytopenia). Platelets are cells that help blood to clot so a wound does not keep bleeding.  Fatigue.  Weakness.  Infections.  Unusual bleeding, such as: ? Bleeding from the nose or gums. ? Bleeding a lot from a small scrape or cut.  High blood calcium levels.  Increased urination.  Confusion.  Shortness of breath.  Weakness or numbness in your legs.  Sudden, severe back pain. How is this diagnosed? This condition is diagnosed based on your symptoms, your medical history, and a physical exam. You will have blood and urine tests to confirm that M proteins are present. You may also have other tests, including:  Additional blood tests.  X-rays.  MRI.  CT scan.  PET scan.  Tests to check the function of your kidneys.  Heart tests, such as an echocardiogram. An echocardiogram uses sound waves to produce an image of the heart.  A procedure to remove a sample of bone marrow (bone marrow biopsy). The sample is examined for abnormal plasma cells. How is this treated? There is no cure for multiple myeloma. However, treatments can manage symptoms and slow the progression of the disease. Treatment options may vary depending on how much the disease has advanced. Possible treatment options may include:  Medicines that kill cancer cells (chemotherapy).  Radiation therapy. This is the use of high-energy rays to kill cancer cells.  A bone marrow transplant. This procedure replaces diseased bone marrow with healthy bone marrow (stem cell transplant).  Medicines that block the growth and spread of cancer cells (targeted drug therapy).  Medicines that strengthen your immune system's ability to fight cancer cells (immunotherapy or biologic therapy).  Participating in clinical trials to find out if new (experimental) treatments are effective.  Medicines that help to prevent bone damage (bisphosphonates).  Medicines that reduce swelling  (corticosteroids).  Surgery to repair bone damage.  A procedure to remove plasma cells from your blood (plasmapheresis).  Other medicines to treat problems such as infections or pain. Follow these instructions at home: Eating and drinking  Drink enough fluid to keep your urine pale yellow.  Try to eat healthy meals on a regular basis. Some of your treatments might affect your appetite. If you are having problems eating or if you do not have an appetite, meet with a diet and nutrition specialist (dietitian).  Take vitamins or supplements only as told by your health care provider or dietitian. Some vitamins and supplements may interfere with how well your treatment works.   General instructions  Take over-the-counter and prescription medicines only as told by your health care provider.  Stay active. Talk with your health care provider about what types of exercises and activities are safe for you. ? Avoid activities that cause increased pain. ? Do not lift anything that is heavier than 10 lb (4.5 kg), or the limit that you are told, until your health care provider says that it is safe.  Consider joining a support group or getting counseling to help you cope with the stress of having multiple myeloma.  Keep all follow-up visits as told by your health care provider. This is important. Where to find more information  American Cancer Society: www.cancer.org  Leukemia and Lymphoma Society: www.LLS.org  [employer] (NCI): www.cancer.gov Contact a health care provider if you:  Have pain that gets worse or does not get better with medicine.  Have a fever.  Have swollen legs.  Have weakness or dizziness.  Have unexplained weight loss.  Have unexplained bleeding or bruising.  Have a cough or symptoms of the common cold.  Feel depressed.  Have changes in urination or bowel movements. Get help right away if you:  Have sudden severe pain, especially back  pain.  Have numbness or weakness in your arms, hands, legs, or feet.  Become very confused.  Have weakness on one side of your body.  Have slurred speech.  Have trouble staying awake.  Have shortness of breath.  Have blood in your stool (feces) or urine.  Vomit blood or cough up blood. Summary  Multiple myeloma is a form of cancer. It develops when abnormal plasma cells grow out of control.  There is no cure for multiple myeloma. However, treatments can  manage symptoms and slow the progression of the disease. Treatment options may vary depending on how much the disease has advanced.  Do not lift anything that is heavier than 10 lb (4.5 kg), or the limit that you are told, until your health care provider says that it is safe.  Contact your health care provider if you have any new symptoms or sudden severe pain, especially back pain. This information is not intended to replace advice given to you by your health care provider. Make sure you discuss any questions you have with your health care provider. Document Revised: 11/23/2019 Document Reviewed: 11/23/2019 Elsevier Patient Education  [place].

## 2020-10-04 ENCOUNTER — Telehealth: Payer: Self-pay | Admitting: Internal Medicine

## 2020-10-04 NOTE — Telephone Encounter (Signed)
Scheduled per 03/21 los, patient has been called and voicemail was left. 

## 2020-10-06 ENCOUNTER — Inpatient Hospital Stay: Payer: Medicare HMO

## 2020-10-25 DIAGNOSIS — E7800 Pure hypercholesterolemia, unspecified: Secondary | ICD-10-CM | POA: Diagnosis not present

## 2020-10-25 DIAGNOSIS — I2510 Atherosclerotic heart disease of native coronary artery without angina pectoris: Secondary | ICD-10-CM | POA: Diagnosis not present

## 2020-10-25 DIAGNOSIS — D649 Anemia, unspecified: Secondary | ICD-10-CM | POA: Diagnosis not present

## 2020-10-25 DIAGNOSIS — E559 Vitamin D deficiency, unspecified: Secondary | ICD-10-CM | POA: Diagnosis not present

## 2020-10-25 DIAGNOSIS — N184 Chronic kidney disease, stage 4 (severe): Secondary | ICD-10-CM | POA: Diagnosis not present

## 2020-10-25 DIAGNOSIS — R5381 Other malaise: Secondary | ICD-10-CM | POA: Diagnosis not present

## 2020-10-25 DIAGNOSIS — I10 Essential (primary) hypertension: Secondary | ICD-10-CM | POA: Diagnosis not present

## 2020-10-25 DIAGNOSIS — Z0000 Encounter for general adult medical examination without abnormal findings: Secondary | ICD-10-CM | POA: Diagnosis not present

## 2020-10-25 DIAGNOSIS — C9000 Multiple myeloma not having achieved remission: Secondary | ICD-10-CM | POA: Diagnosis not present

## 2020-10-25 DIAGNOSIS — R413 Other amnesia: Secondary | ICD-10-CM | POA: Diagnosis not present

## 2020-10-25 DIAGNOSIS — I739 Peripheral vascular disease, unspecified: Secondary | ICD-10-CM | POA: Diagnosis not present

## 2020-12-22 DIAGNOSIS — H524 Presbyopia: Secondary | ICD-10-CM | POA: Diagnosis not present

## 2020-12-22 DIAGNOSIS — H26492 Other secondary cataract, left eye: Secondary | ICD-10-CM | POA: Diagnosis not present

## 2020-12-22 DIAGNOSIS — H5212 Myopia, left eye: Secondary | ICD-10-CM | POA: Diagnosis not present

## 2020-12-27 DIAGNOSIS — M8589 Other specified disorders of bone density and structure, multiple sites: Secondary | ICD-10-CM | POA: Diagnosis not present

## 2021-01-02 ENCOUNTER — Inpatient Hospital Stay: Payer: Medicare HMO | Admitting: Internal Medicine

## 2021-01-02 ENCOUNTER — Inpatient Hospital Stay: Payer: Medicare HMO | Attending: Internal Medicine

## 2021-01-03 ENCOUNTER — Other Ambulatory Visit: Payer: Medicare HMO

## 2021-01-10 ENCOUNTER — Ambulatory Visit: Payer: Medicare HMO | Admitting: Internal Medicine

## 2021-01-24 DIAGNOSIS — E7800 Pure hypercholesterolemia, unspecified: Secondary | ICD-10-CM | POA: Diagnosis not present

## 2021-01-24 DIAGNOSIS — M85852 Other specified disorders of bone density and structure, left thigh: Secondary | ICD-10-CM | POA: Diagnosis not present

## 2021-01-24 DIAGNOSIS — I2510 Atherosclerotic heart disease of native coronary artery without angina pectoris: Secondary | ICD-10-CM | POA: Diagnosis not present

## 2021-01-24 DIAGNOSIS — I10 Essential (primary) hypertension: Secondary | ICD-10-CM | POA: Diagnosis not present

## 2021-02-14 DIAGNOSIS — I2510 Atherosclerotic heart disease of native coronary artery without angina pectoris: Secondary | ICD-10-CM | POA: Diagnosis not present

## 2021-02-14 DIAGNOSIS — E7800 Pure hypercholesterolemia, unspecified: Secondary | ICD-10-CM | POA: Diagnosis not present

## 2021-02-14 DIAGNOSIS — I10 Essential (primary) hypertension: Secondary | ICD-10-CM | POA: Diagnosis not present

## 2021-03-02 DIAGNOSIS — Z951 Presence of aortocoronary bypass graft: Secondary | ICD-10-CM | POA: Diagnosis not present

## 2021-03-23 ENCOUNTER — Other Ambulatory Visit: Payer: Self-pay | Admitting: Cardiovascular Disease

## 2021-03-24 ENCOUNTER — Other Ambulatory Visit: Payer: Self-pay | Admitting: Cardiovascular Disease

## 2021-04-24 DIAGNOSIS — I10 Essential (primary) hypertension: Secondary | ICD-10-CM | POA: Diagnosis not present

## 2021-04-24 DIAGNOSIS — E7800 Pure hypercholesterolemia, unspecified: Secondary | ICD-10-CM | POA: Diagnosis not present

## 2021-04-24 DIAGNOSIS — I2510 Atherosclerotic heart disease of native coronary artery without angina pectoris: Secondary | ICD-10-CM | POA: Diagnosis not present

## 2021-04-24 DIAGNOSIS — M791 Myalgia, unspecified site: Secondary | ICD-10-CM | POA: Diagnosis not present

## 2021-04-24 DIAGNOSIS — T466X5A Adverse effect of antihyperlipidemic and antiarteriosclerotic drugs, initial encounter: Secondary | ICD-10-CM | POA: Diagnosis not present

## 2021-04-26 ENCOUNTER — Ambulatory Visit (HOSPITAL_COMMUNITY)
Admission: RE | Admit: 2021-04-26 | Discharge: 2021-04-26 | Disposition: A | Discharge status: Home / Self Care | Payer: Medicare HMO | Source: Ambulatory Visit | Attending: Cardiology | Admitting: Cardiology

## 2021-04-26 ENCOUNTER — Other Ambulatory Visit: Payer: Self-pay

## 2021-04-26 DIAGNOSIS — I6523 Occlusion and stenosis of bilateral carotid arteries: Secondary | ICD-10-CM | POA: Insufficient documentation

## 2021-07-14 ENCOUNTER — Ambulatory Visit: Payer: Medicare HMO | Admitting: Podiatry

## 2021-07-14 ENCOUNTER — Other Ambulatory Visit: Payer: Self-pay

## 2021-07-14 ENCOUNTER — Encounter: Payer: Self-pay | Admitting: Podiatry

## 2021-07-14 DIAGNOSIS — M2042 Other hammer toe(s) (acquired), left foot: Secondary | ICD-10-CM | POA: Diagnosis not present

## 2021-07-14 DIAGNOSIS — M79675 Pain in left toe(s): Secondary | ICD-10-CM | POA: Diagnosis not present

## 2021-07-14 DIAGNOSIS — M2041 Other hammer toe(s) (acquired), right foot: Secondary | ICD-10-CM

## 2021-07-14 DIAGNOSIS — M79674 Pain in right toe(s): Secondary | ICD-10-CM | POA: Diagnosis not present

## 2021-07-14 DIAGNOSIS — L84 Corns and callosities: Secondary | ICD-10-CM | POA: Diagnosis not present

## 2021-07-14 DIAGNOSIS — I999 Unspecified disorder of circulatory system: Secondary | ICD-10-CM

## 2021-07-14 DIAGNOSIS — B351 Tinea unguium: Secondary | ICD-10-CM | POA: Diagnosis not present

## 2021-07-14 NOTE — Progress Notes (Signed)
Subjective:   Patient ID: Tara Potts, female   DOB: 87 y.o.   MRN: [id number]   HPI Patient presents with caregiver with severe lesions plantar aspect both feet and nail disease with long-term history of vascular disease that she is treated for.  No other significant pathology and patient does not smoke tries to be active   Review of Systems  All other systems reviewed and are negative.      Objective:  Physical Exam Vitals and nursing note reviewed.  Constitutional:      Appearance: She is well-developed.  Pulmonary:     Effort: Pulmonary effort is normal.  Musculoskeletal:        General: Normal range of motion.  Skin:    General: Skin is warm.  Neurological:     Mental Status: She is alert.    Vascular status is diminished with no pulses currently with thick yellow brittle nailbeds 1-5 both feet dystrophic and lesions some hallux right and third digit bilateral with pain with hammertoe deformity bilateral digital deformity and digital perfusion reduced     Assessment:  Long-term vascular disease that she does keep under control with severe nail disease with pain and lesion formation bilateral     Plan:  H&P discussed visualization of her feet on a daily basis with caregiver debrided nailbeds debrided lesion no iatrogenic bleeding reappoint routine care

## 2021-07-16 ENCOUNTER — Emergency Department (HOSPITAL_COMMUNITY): Payer: Medicare HMO

## 2021-07-16 ENCOUNTER — Inpatient Hospital Stay (HOSPITAL_COMMUNITY)
Admission: EM | Admit: 2021-07-16 | Discharge: 2021-07-20 | DRG: 281 | Disposition: A | Discharge status: Home Health | Payer: Medicare HMO | Attending: Internal Medicine | Admitting: Internal Medicine

## 2021-07-16 ENCOUNTER — Other Ambulatory Visit: Payer: Self-pay

## 2021-07-16 ENCOUNTER — Observation Stay (HOSPITAL_COMMUNITY): Payer: Medicare HMO

## 2021-07-16 ENCOUNTER — Encounter (HOSPITAL_COMMUNITY): Payer: Self-pay

## 2021-07-16 DIAGNOSIS — I480 Paroxysmal atrial fibrillation: Secondary | ICD-10-CM | POA: Diagnosis present

## 2021-07-16 DIAGNOSIS — I724 Aneurysm of artery of lower extremity: Secondary | ICD-10-CM | POA: Diagnosis not present

## 2021-07-16 DIAGNOSIS — N1832 Chronic kidney disease, stage 3b: Secondary | ICD-10-CM | POA: Diagnosis present

## 2021-07-16 DIAGNOSIS — E669 Obesity, unspecified: Secondary | ICD-10-CM | POA: Diagnosis present

## 2021-07-16 DIAGNOSIS — Z8249 Family history of ischemic heart disease and other diseases of the circulatory system: Secondary | ICD-10-CM

## 2021-07-16 DIAGNOSIS — N183 Chronic kidney disease, stage 3 unspecified: Secondary | ICD-10-CM | POA: Diagnosis present

## 2021-07-16 DIAGNOSIS — I129 Hypertensive chronic kidney disease with stage 1 through stage 4 chronic kidney disease, or unspecified chronic kidney disease: Secondary | ICD-10-CM | POA: Diagnosis present

## 2021-07-16 DIAGNOSIS — E785 Hyperlipidemia, unspecified: Secondary | ICD-10-CM | POA: Diagnosis present

## 2021-07-16 DIAGNOSIS — I25110 Atherosclerotic heart disease of native coronary artery with unstable angina pectoris: Secondary | ICD-10-CM | POA: Diagnosis present

## 2021-07-16 DIAGNOSIS — I10 Essential (primary) hypertension: Secondary | ICD-10-CM | POA: Diagnosis not present

## 2021-07-16 DIAGNOSIS — Z96641 Presence of right artificial hip joint: Secondary | ICD-10-CM | POA: Diagnosis present

## 2021-07-16 DIAGNOSIS — R079 Chest pain, unspecified: Secondary | ICD-10-CM

## 2021-07-16 DIAGNOSIS — Z833 Family history of diabetes mellitus: Secondary | ICD-10-CM

## 2021-07-16 DIAGNOSIS — R9431 Abnormal electrocardiogram [ECG] [EKG]: Secondary | ICD-10-CM | POA: Diagnosis not present

## 2021-07-16 DIAGNOSIS — I2510 Atherosclerotic heart disease of native coronary artery without angina pectoris: Secondary | ICD-10-CM | POA: Diagnosis not present

## 2021-07-16 DIAGNOSIS — I4892 Unspecified atrial flutter: Secondary | ICD-10-CM | POA: Diagnosis present

## 2021-07-16 DIAGNOSIS — E871 Hypo-osmolality and hyponatremia: Secondary | ICD-10-CM | POA: Diagnosis present

## 2021-07-16 DIAGNOSIS — Z888 Allergy status to other drugs, medicaments and biological substances status: Secondary | ICD-10-CM

## 2021-07-16 DIAGNOSIS — J45909 Unspecified asthma, uncomplicated: Secondary | ICD-10-CM | POA: Diagnosis present

## 2021-07-16 DIAGNOSIS — Z20822 Contact with and (suspected) exposure to covid-19: Secondary | ICD-10-CM | POA: Diagnosis present

## 2021-07-16 DIAGNOSIS — I161 Hypertensive emergency: Secondary | ICD-10-CM | POA: Diagnosis present

## 2021-07-16 DIAGNOSIS — I471 Supraventricular tachycardia: Secondary | ICD-10-CM | POA: Diagnosis present

## 2021-07-16 DIAGNOSIS — Z6830 Body mass index (BMI) 30.0-30.9, adult: Secondary | ICD-10-CM

## 2021-07-16 DIAGNOSIS — C9000 Multiple myeloma not having achieved remission: Secondary | ICD-10-CM | POA: Diagnosis present

## 2021-07-16 DIAGNOSIS — D631 Anemia in chronic kidney disease: Secondary | ICD-10-CM | POA: Diagnosis present

## 2021-07-16 DIAGNOSIS — Z885 Allergy status to narcotic agent status: Secondary | ICD-10-CM

## 2021-07-16 DIAGNOSIS — I25810 Atherosclerosis of coronary artery bypass graft(s) without angina pectoris: Secondary | ICD-10-CM | POA: Diagnosis not present

## 2021-07-16 DIAGNOSIS — Z951 Presence of aortocoronary bypass graft: Secondary | ICD-10-CM | POA: Diagnosis not present

## 2021-07-16 DIAGNOSIS — I200 Unstable angina: Secondary | ICD-10-CM | POA: Diagnosis present

## 2021-07-16 DIAGNOSIS — Z79899 Other long term (current) drug therapy: Secondary | ICD-10-CM

## 2021-07-16 DIAGNOSIS — R0789 Other chest pain: Secondary | ICD-10-CM | POA: Diagnosis not present

## 2021-07-16 DIAGNOSIS — Z803 Family history of malignant neoplasm of breast: Secondary | ICD-10-CM

## 2021-07-16 DIAGNOSIS — R001 Bradycardia, unspecified: Secondary | ICD-10-CM | POA: Diagnosis not present

## 2021-07-16 DIAGNOSIS — Z87891 Personal history of nicotine dependence: Secondary | ICD-10-CM

## 2021-07-16 DIAGNOSIS — D649 Anemia, unspecified: Secondary | ICD-10-CM | POA: Diagnosis not present

## 2021-07-16 DIAGNOSIS — I214 Non-ST elevation (NSTEMI) myocardial infarction: Secondary | ICD-10-CM | POA: Diagnosis present

## 2021-07-16 DIAGNOSIS — Z7982 Long term (current) use of aspirin: Secondary | ICD-10-CM | POA: Diagnosis not present

## 2021-07-16 LAB — CBC
HCT: 34.2 % — ABNORMAL LOW (ref 36.0–46.0)
Hemoglobin: 11 g/dL — ABNORMAL LOW (ref 12.0–15.0)
MCH: 27.2 pg (ref 26.0–34.0)
MCHC: 32.2 g/dL (ref 30.0–36.0)
MCV: 84.4 fL (ref 80.0–100.0)
Platelets: 220 10*3/uL (ref 150–400)
RBC: 4.05 MIL/uL (ref 3.87–5.11)
RDW: 18.4 % — ABNORMAL HIGH (ref 11.5–15.5)
WBC: 7.5 10*3/uL (ref 4.0–10.5)
nRBC: 0 % (ref 0.0–0.2)

## 2021-07-16 LAB — COMPREHENSIVE METABOLIC PANEL
ALT: 22 U/L (ref 0–44)
AST: 34 U/L (ref 15–41)
Albumin: 3.1 g/dL — ABNORMAL LOW (ref 3.5–5.0)
Alkaline Phosphatase: 67 U/L (ref 38–126)
Anion gap: 7 (ref 5–15)
BUN: 37 mg/dL — ABNORMAL HIGH (ref 8–23)
CO2: 24 mmol/L (ref 22–32)
Calcium: 8.6 mg/dL — ABNORMAL LOW (ref 8.9–10.3)
Chloride: 106 mmol/L (ref 98–111)
Creatinine, Ser: 1.55 mg/dL — ABNORMAL HIGH (ref 0.44–1.00)
GFR, Estimated: 33 mL/min — ABNORMAL LOW (ref >60)
Glucose, Bld: 105 mg/dL — ABNORMAL HIGH (ref 70–99)
Potassium: 3.9 mmol/L (ref 3.5–5.1)
Sodium: 137 mmol/L (ref 135–145)
Total Bilirubin: 0.6 mg/dL (ref 0.3–1.2)
Total Protein: 8.4 g/dL — ABNORMAL HIGH (ref 6.5–8.1)

## 2021-07-16 LAB — BASIC METABOLIC PANEL
Anion gap: 10 (ref 5–15)
BUN: 34 mg/dL — ABNORMAL HIGH (ref 8–23)
CO2: 22 mmol/L (ref 22–32)
Calcium: 9.1 mg/dL (ref 8.9–10.3)
Chloride: 102 mmol/L (ref 98–111)
Creatinine, Ser: 1.52 mg/dL — ABNORMAL HIGH (ref 0.44–1.00)
GFR, Estimated: 33 mL/min — ABNORMAL LOW (ref >60)
Glucose, Bld: 105 mg/dL — ABNORMAL HIGH (ref 70–99)
Potassium: 4.9 mmol/L (ref 3.5–5.1)
Sodium: 134 mmol/L — ABNORMAL LOW (ref 135–145)

## 2021-07-16 LAB — ECHOCARDIOGRAM COMPLETE
Area-P 1/2: 3.95 cm2
Height: 67 in
MV VTI: 1.76 cm2
S' Lateral: 2.7 cm
Weight: 3168 oz

## 2021-07-16 LAB — MRSA NEXT GEN BY PCR, NASAL: MRSA by PCR Next Gen: NOT DETECTED

## 2021-07-16 LAB — TROPONIN I (HIGH SENSITIVITY)
Troponin I (High Sensitivity): 13 ng/L (ref <18)
Troponin I (High Sensitivity): 1398 ng/L (ref <18)

## 2021-07-16 LAB — RESP PANEL BY RT-PCR (FLU A&B, COVID) ARPGX2
Influenza A by PCR: NEGATIVE
Influenza B by PCR: NEGATIVE
SARS Coronavirus 2 by RT PCR: NEGATIVE

## 2021-07-16 LAB — MAGNESIUM: Magnesium: 2 mg/dL (ref 1.7–2.4)

## 2021-07-16 MED ORDER — METOPROLOL TARTRATE 25 MG PO TABS
75.0000 mg | ORAL_TABLET | Freq: Two times a day (BID) | ORAL | Status: DC
  Administered 2021-07-16: 75 mg via ORAL
  Filled 2021-07-16 (×2): qty 3

## 2021-07-16 MED ORDER — EZETIMIBE 10 MG PO TABS
10.0000 mg | ORAL_TABLET | Freq: Every day | ORAL | Status: DC
  Administered 2021-07-16 – 2021-07-20 (×5): 10 mg via ORAL
  Filled 2021-07-16 (×5): qty 1

## 2021-07-16 MED ORDER — METHOCARBAMOL 500 MG PO TABS
750.0000 mg | ORAL_TABLET | Freq: Once | ORAL | Status: AC
Start: 2021-07-17 — End: 2021-07-16
  Administered 2021-07-16: 750 mg via ORAL
  Filled 2021-07-16: qty 2

## 2021-07-16 MED ORDER — MORPHINE SULFATE (PF) 2 MG/ML IV SOLN
2.0000 mg | Freq: Once | INTRAVENOUS | Status: AC
  Administered 2021-07-16: 2 mg via INTRAVENOUS
  Filled 2021-07-16: qty 1

## 2021-07-16 MED ORDER — NITROGLYCERIN IN D5W 200-5 MCG/ML-% IV SOLN
0.0000 ug/min | INTRAVENOUS | Status: DC
  Administered 2021-07-16: 10:00:00 5 ug/min via INTRAVENOUS
  Administered 2021-07-17: 21:00:00 20 ug/min via INTRAVENOUS
  Filled 2021-07-16 (×2): qty 250

## 2021-07-16 MED ORDER — POTASSIUM CHLORIDE ER 10 MEQ PO TBCR
10.0000 meq | EXTENDED_RELEASE_TABLET | Freq: Every day | ORAL | Status: DC
  Administered 2021-07-16 – 2021-07-20 (×5): 10 meq via ORAL
  Filled 2021-07-16 (×10): qty 1

## 2021-07-16 MED ORDER — ONDANSETRON HCL 4 MG/2ML IJ SOLN: 4.0000 mg | Freq: Four times a day (QID) | INTRAMUSCULAR | Status: DC | PRN

## 2021-07-16 MED ORDER — ACETAMINOPHEN 650 MG RE SUPP: 650.0000 mg | Freq: Four times a day (QID) | RECTAL | Status: DC | PRN

## 2021-07-16 MED ORDER — HYDRALAZINE HCL 20 MG/ML IJ SOLN
10.0000 mg | Freq: Four times a day (QID) | INTRAMUSCULAR | Status: AC | PRN
  Administered 2021-07-17 (×2): 10 mg via INTRAVENOUS
  Filled 2021-07-16 (×2): qty 1

## 2021-07-16 MED ORDER — ASPIRIN 81 MG PO CHEW
324.0000 mg | CHEWABLE_TABLET | Freq: Once | ORAL | Status: AC
  Administered 2021-07-16: 324 mg via ORAL
  Filled 2021-07-16: qty 4

## 2021-07-16 MED ORDER — ALPRAZOLAM 0.25 MG PO TABS
0.2500 mg | ORAL_TABLET | Freq: Once | ORAL | Status: AC
  Administered 2021-07-17: 0.25 mg via ORAL
  Filled 2021-07-16: qty 1

## 2021-07-16 MED ORDER — HYDRALAZINE HCL 50 MG PO TABS
50.0000 mg | ORAL_TABLET | Freq: Two times a day (BID) | ORAL | Status: DC
  Administered 2021-07-16 – 2021-07-18 (×5): 50 mg via ORAL
  Filled 2021-07-16 (×5): qty 1

## 2021-07-16 MED ORDER — ACETAMINOPHEN 325 MG PO TABS
650.0000 mg | ORAL_TABLET | Freq: Four times a day (QID) | ORAL | Status: DC | PRN
  Filled 2021-07-16: qty 2

## 2021-07-16 MED ORDER — ASPIRIN EC 81 MG PO TBEC
81.0000 mg | DELAYED_RELEASE_TABLET | Freq: Every day | ORAL | Status: DC
  Administered 2021-07-17 – 2021-07-18 (×2): 81 mg via ORAL
  Filled 2021-07-16 (×2): qty 1

## 2021-07-16 MED ORDER — HYDRALAZINE HCL 25 MG PO TABS
25.0000 mg | ORAL_TABLET | Freq: Two times a day (BID) | ORAL | Status: DC
  Administered 2021-07-16: 25 mg via ORAL
  Filled 2021-07-16: qty 1

## 2021-07-16 MED ORDER — CHLORHEXIDINE GLUCONATE CLOTH 2 % EX PADS
6.0000 | MEDICATED_PAD | Freq: Every day | CUTANEOUS | Status: DC
  Administered 2021-07-16 – 2021-07-17 (×2): 6 via TOPICAL

## 2021-07-16 MED ORDER — ENOXAPARIN SODIUM 40 MG/0.4ML IJ SOSY
40.0000 mg | PREFILLED_SYRINGE | INTRAMUSCULAR | Status: DC
  Administered 2021-07-16: 40 mg via SUBCUTANEOUS

## 2021-07-16 MED ORDER — FUROSEMIDE 40 MG PO TABS
40.0000 mg | ORAL_TABLET | Freq: Every day | ORAL | Status: DC
  Administered 2021-07-16 – 2021-07-18 (×3): 40 mg via ORAL
  Filled 2021-07-16 (×4): qty 1

## 2021-07-16 MED ORDER — PANTOPRAZOLE SODIUM 40 MG PO TBEC
40.0000 mg | DELAYED_RELEASE_TABLET | Freq: Once | ORAL | Status: AC
  Administered 2021-07-16: 40 mg via ORAL
  Filled 2021-07-16: qty 1

## 2021-07-16 MED ORDER — HYDRALAZINE HCL 20 MG/ML IJ SOLN
10.0000 mg | Freq: Once | INTRAMUSCULAR | Status: AC
  Administered 2021-07-16: 10 mg via INTRAVENOUS
  Filled 2021-07-16: qty 1

## 2021-07-16 MED ORDER — LOSARTAN POTASSIUM 50 MG PO TABS
100.0000 mg | ORAL_TABLET | Freq: Every day | ORAL | Status: DC
  Administered 2021-07-16 – 2021-07-20 (×5): 100 mg via ORAL
  Filled 2021-07-16 (×3): qty 2
  Filled 2021-07-16: qty 4
  Filled 2021-07-16: qty 2

## 2021-07-16 MED ORDER — SODIUM CHLORIDE 0.9 % IV SOLN
INTRAVENOUS | Status: DC
Start: 2021-07-16 — End: 2021-07-20

## 2021-07-16 MED ORDER — ENOXAPARIN SODIUM 40 MG/0.4ML IJ SOSY: 40.0000 mg | PREFILLED_SYRINGE | INTRAMUSCULAR | Status: DC

## 2021-07-16 MED ORDER — HYDRALAZINE HCL 50 MG PO TABS
50.0000 mg | ORAL_TABLET | Freq: Two times a day (BID) | ORAL | Status: DC
  Filled 2021-07-16: qty 1

## 2021-07-16 NOTE — Progress Notes (Signed)
Paged by bedside RN with concerns for changes in ECG, bradycardia, elevated troponin, and persistent hypertensive crisis despite being on nitro gtt. Consulted cardiology who suggested to continue with nitro gtt, increase hydralazine and aggressively control blood pressure  Unstable angina - Continue with nitro gtt - If trop continues to rise above 2000 then transfer to [facility] for cardiology evaluation - Continue serial trop - ECG at [facility]  Hypertensive emergency  - Increase hydralazine to 50mg  po bid - Continue with nitro gtt  Bradycardia, 2nd Degree A-V block  - Hold metoprolol for now - CMP, CBC  Lovey Newcomer, NP [facility] 7p-7a [phone]

## 2021-07-16 NOTE — ED Notes (Signed)
PureWick placed on patient. 

## 2021-07-16 NOTE — ED Notes (Signed)
RN called lab due to troponin not in process. Lab states cannot find tube.

## 2021-07-16 NOTE — Progress Notes (Signed)
Echocardiogram 2D Echocardiogram has been performed.  Oneal Deputy Cato Liburd RDCS 07/16/2021, 2:28 PM

## 2021-07-16 NOTE — ED Notes (Signed)
Patient was given her lunch tray. 

## 2021-07-16 NOTE — ED Provider Notes (Signed)
[facility] DEPT Provider Note   CSN: [id number] Arrival date & time: 07/16/21  0950     History  Chief Complaint  Patient presents with   Chest Pain    Tara Potts is a 88 y.o. female.  88 year old female with history of CAD presents with chest pain that began around 7:00 this morning.  Substernal heaviness with some radiation to the shoulder.  Has been short of breath and slightly diaphoretic.  Was given nitroglycerin x2 at home with some relief.  No recent illnesses      Home Medications Prior to Admission medications   Medication Sig Start Date End Date Taking? Authorizing Provider  aspirin EC 81 MG tablet Take 81 mg by mouth daily.    [provider]  atorvastatin (LIPITOR) 80 MG tablet Take 80 mg by mouth daily. Patient not taking: Reported on 10/03/2020    [provider]  furosemide (LASIX) 40 MG tablet Take 1 tablet (40 mg total) by mouth daily. Please weight yourself daily, check your legs daily, if your weight go up by 3lbs in three days and legs are swollen, take lasix 80mg   in the morning and call your doctor 07/06/20   Erlene Quan, PA-C  Glucosamine HCl (GLUCOSAMINE PO) Take 1 tablet by mouth daily.    [provider]  hydrALAZINE (APRESOLINE) 25 MG tablet TAKE 1 TABLET (25 MG TOTAL) BY MOUTH IN THE MORNING AND AT BEDTIME. 03/23/21   Lorretta Harp, MD  metoprolol tartrate (LOPRESSOR) 25 MG tablet Take 3 tablets (75 mg total) by mouth 2 (two) times daily. 06/24/20   Florencia Reasons, MD  potassium chloride (KLOR-CON) 10 MEQ tablet TAKE 1 TABLET(10 MEQ) BY MOUTH DAILY Patient taking differently: Take 10 mEq by mouth daily. TAKE 1 TABLET(10 MEQ) BY MOUTH DAILY 11/10/19   Lorretta Harp, MD      Allergies    Codeine, Cymbalta [duloxetine hcl], Ezetimibe, and Gabapentin    Review of Systems   Review of Systems  All other systems reviewed and are negative.  Physical Exam Updated Vital Signs BP (!) 218/95 (BP  Location: Left Arm)    Pulse (!) 112    Temp 98 F (36.7 C) (Oral)    Resp (!) 22    Ht 1.702 m (5\' 7" )    Wt 89.8 kg    LMP 07/16/2002    SpO2 100%    BMI 31.01 kg/m  Physical Exam Vitals and nursing note reviewed.  Constitutional:      General: She is not in acute distress.    Appearance: Normal appearance. She is well-developed. She is not toxic-appearing.  HENT:     Head: Normocephalic and atraumatic.  Eyes:     General: Lids are normal.     Conjunctiva/sclera: Conjunctivae normal.     Pupils: Pupils are equal, round, and reactive to light.  Neck:     Thyroid: No thyroid mass.     Trachea: No tracheal deviation.  Cardiovascular:     Rate and Rhythm: Normal rate and regular rhythm.     Heart sounds: Normal heart sounds. No murmur heard.   No gallop.  Pulmonary:     Effort: Pulmonary effort is normal. No respiratory distress.     Breath sounds: Normal breath sounds. No stridor. No decreased breath sounds, wheezing, rhonchi or rales.  Abdominal:     General: There is no distension.     Palpations: Abdomen is soft.     Tenderness: There  is no abdominal tenderness. There is no rebound.  Musculoskeletal:        General: No tenderness. Normal range of motion.     Cervical back: Normal range of motion and neck supple.  Skin:    General: Skin is warm and dry.     Findings: No abrasion or rash.  Neurological:     Mental Status: She is alert and oriented to person, place, and time. Mental status is at baseline.     GCS: GCS eye subscore is 4. GCS verbal subscore is 5. GCS motor subscore is 6.     Cranial Nerves: No cranial nerve deficit.     Sensory: No sensory deficit.     Motor: Motor function is intact.  Psychiatric:        Attention and Perception: Attention normal.        Speech: Speech normal.        Behavior: Behavior normal.    ED Results / Procedures / Treatments   Labs (all labs ordered are listed, but only abnormal results are displayed) Labs Reviewed  RESP PANEL  BY RT-PCR (FLU A&B, COVID) ARPGX2  BASIC METABOLIC PANEL  CBC  TROPONIN I (HIGH SENSITIVITY)    EKG None  Radiology No results found.  Procedures Procedures    Medications Ordered in ED Medications  0.9 %  sodium chloride infusion (has no administration in time range)  nitroGLYCERIN 50 mg in dextrose 5 % 250 mL (0.2 mg/mL) infusion (has no administration in time range)  aspirin chewable tablet 324 mg (has no administration in time range)    ED Course/ Medical Decision Making/ A&P                           Medical Decision Making Patient presented with chest pain and EKG changes consistent with ischemia.  No evidence of infarction.  Will start on nitroglycerin drip and had good response.  She was also profoundly hypertensive and that is also improved with being on nitro.  She is currently pain-free.  Patient's first troponin is negative.  She does not meet STEMI criteria at this time.  Patient had 2 EKGs repeated to assess for ongoing ischemia and they have improved.  Plan will be for admission for ACS rule out and cardiac risk stratification  Amount and/or Complexity of Data Reviewed Independent Historian: parent and caregiver External Data Reviewed: ECG and notes. Labs: ordered. Decision-making details documented in ED Course. Radiology: independent interpretation performed.    Details: No evidence of ECG/medicine tests: independent interpretation performed.    Details: Shows coronary artery ischemia with no evidence of infarction. Discussion of management or test interpretation with external provider(s): Will discuss admission with hospitalist service  Risk Drug therapy requiring intensive monitoring for toxicity. Decision regarding hospitalization.  Critical Care Total time providing critical care: 30-74 minutes          Final Clinical Impression(s) / ED Diagnoses Final diagnoses:  None    Rx / DC Orders ED Discharge Orders     None          Lacretia Leigh, MD 07/16/21 1224

## 2021-07-16 NOTE — ED Triage Notes (Signed)
Patient c/o mid chest pain since 0700 today. Patient denies SOB.  BP in triage 218/95, but patient has taken her BP med today.

## 2021-07-16 NOTE — H&P (Signed)
History and Physical    Tara Potts ELF:[id number] DOB: 18-Sep-1933 DOA: 07/16/2021  PCP: [name], MD  Patient coming from: Home.  I have personally briefly reviewed patient's old medical records in [place]  Chief Complaint: Chest pain.  HPI: Tara Potts is a 88 y.o. female with medical history significant of normocytic anemia, osteoarthritis, lumbar radiculopathy, childhood asthma, CAD s/p CABG, bilateral carotid artery disease, hyperlipidemia, hypertension, fibroids, history of menopausal symptoms, peripheral neuropathy, history of seizure-like activity, class I obesity, stage IIIb CKD, multiple myeloma with coming to the emergency department with complaints of nonradiating precordial chest pain nonradiating, partially relieved by sublingual NTG without complaints of dyspnea, dizziness, nausea, emesis, diaphoresis, PND, orthopnea, but she gets frequent lower extremity edema.  No fever, sore throat, rhinorrhea, wheezing or hemoptysis.  Denied abdominal pain, diarrhea, constipation, melena or hematochezia.  No flank pain, dysuria, frequency or hematuria.  No polyuria, polydipsia, polyphagia or blurred vision.  ED Course: Initial vital signs were temperature 98 F, pulse 113, respiration 21, BP 218/95 mmHg O2 sat 100% on room air.  The patient was given aspirin 324 mg p.o. x1 and was started on a nitroglycerin infusion.  Lab work: CBC showed a white count 7.5, hemoglobin 11.0 g/dL platelets 220.  First troponin was normal.  Sodium was 134 mmol/L, the rest of the electrolytes were normal.  Glucose 105, BUN 34 and creatinine 1.52 mg/dL.  Renal function results are within patient's creatinine and GFR baseline.  Imaging: 1 view chest radiograph showed normal cardiac silhouette size and sternotomy.  There was no acute cardiopulmonary disease.  Please see images and full radiology report for further details.  Please see image and full radiology report for further details.  Review of  Systems: As per HPI otherwise all other systems reviewed and are negative.  Past Medical History:  Diagnosis Date   Anemia    Arthritis    Asthma    as a child   CAD (coronary artery disease)    Carotid disease, bilateral ([place])    mild   Fibroid    Hx of CABG 2007   [facility]   Hyperlipemia    Hypertension    Menopausal symptoms    Peripheral neuropathy    Seizure-like activity [facility])     Past Surgical History:  Procedure Laterality Date   CARDIAC CATHETERIZATION  01/09/2008   diffusely diseased graft to the PDA & diffuse PDA disease   CAROTID DOPPLER  08/27/2011   mod. right & nild left ICA stenosis   CATARACT SURGERY     CORONARY ARTERY BYPASS GRAFT     FOOT SURGERY     JOINT REPLACEMENT     Right hip Dr. Alvan Dame 01-28-18    MASS REMOVED FROM CERVIX  12/1997   Benign endocervical inclusion cysts   NM MYOVIEW LTD  01/12/2011   no ischemia   TOTAL HIP ARTHROPLASTY Right 01/28/2018   Procedure: RIGHT TOTAL HIP ARTHROPLASTY ANTERIOR APPROACH;  Surgeon: Paralee Cancel, MD;  Location: [facility] ORS;  Service: Orthopedics;  Laterality: Right;  70 mins   TRIPLE HEART BYPASS  05/2006   [facility]   US ECHOCARDIOGRAPHY  10/30/2007   mild MA,MR,TR,AOV mildly sclerotic,density oted in the prox asc aorta    Social History  reports that she has quit smoking. She has never used smokeless tobacco. She reports that she does not drink alcohol and does not use drugs.  Allergies  Allergen Reactions   Codeine Nausea And Vomiting  confusion   Cymbalta [Duloxetine Hcl] Nausea Only   Ezetimibe     Other reaction(s): cramping   Gabapentin     Other reaction(s): felt crazy    Family History  Problem Relation Age of Onset   Diabetes Mother    Hypertension Mother    Heart disease Mother    Heart failure Mother    Hypertension Sister    Breast cancer Maternal Aunt        Age 81   Heart failure Maternal Aunt    Heart failure Maternal Uncle    Prior to Admission medications    Medication Sig Start Date End Date Taking? Authorizing Provider  aspirin EC 81 MG tablet Take 81 mg by mouth daily.   Yes [provider]  ezetimibe (ZETIA) 10 MG tablet Take 10 mg by mouth daily. 04/18/21  Yes [provider]  furosemide (LASIX) 40 MG tablet Take 1 tablet (40 mg total) by mouth daily. Please weight yourself daily, check your legs daily, if your weight go up by 3lbs in three days and legs are swollen, take lasix 4m  in the morning and call your doctor Patient taking differently: Take 40 mg by mouth See admin instructions. Take 412mdaily and Please weight yourself daily, check your legs daily, if your weight go up by 3lbs in three days and legs are swollen, take an extra 4010min the morning and call your doctor 07/06/20  Yes KilErlene QuanA-C  Glucosamine HCl (GLUCOSAMINE PO) Take 1 tablet by mouth daily.   Yes [provider]  hydrALAZINE (APRESOLINE) 25 MG tablet TAKE 1 TABLET (25 MG TOTAL) BY MOUTH IN THE MORNING AND AT BEDTIME. Patient taking differently: Take 25 mg by mouth 2 (two) times daily. 03/23/21  Yes BerLorretta HarpD  losartan (COZAAR) 100 MG tablet Take 100 mg by mouth daily. 05/05/21  Yes [provider]  Metoprolol Tartrate 75 MG TABS Take 75 mg by mouth 2 (two) times daily. 04/25/21  Yes [provider]  potassium chloride (KLOR-CON) 10 MEQ tablet TAKE 1 TABLET(10 MEQ) BY MOUTH DAILY Patient taking differently: Take 10 mEq by mouth daily. TAKE 1 TABLET(10 MEQ) BY MOUTH DAILY 11/10/19  Yes BerLorretta HarpD  REPATHA SURECLICK 140102/ML SOAJ Inject 1 mL into the skin every 30 (thirty) days. 07/07/21  Yes [provider]  metoprolol tartrate (LOPRESSOR) 25 MG tablet Take 3 tablets (75 mg total) by mouth 2 (two) times daily. Patient not taking: Reported on 07/16/2021 06/24/20   Xu,Florencia ReasonsD    Physical Exam: Vitals:   07/16/21 1110 07/16/21 1120 07/16/21 1130 07/16/21 1140  BP: (!) 185/78 (!) 176/73 (!)  192/86 (!) 189/81  Pulse: (!) 101 100 (!) 101 (!) 101  Resp: _0 Temp:      TempSrc:      SpO2: 100% 100% 100% 100%  Weight:      Height:        Constitutional: NAD, calm, comfortable Eyes: PERRL, lids and conjunctivae normal ENMT: Mucous membranes are moist. Posterior pharynx clear of any exudate or lesions. Neck: normal, supple, no masses, no thyromegaly Respiratory: clear to auscultation bilaterally, no wheezing, no crackles. Normal respiratory effort. No accessory muscle use.  Cardiovascular: Regular rate and rhythm, no murmurs / rubs / gallops. No extremity edema. 2+ pedal pulses. No carotid bruits.  Abdomen: Obese, no distention.  Soft, no tenderness, no masses palpated. No hepatosplenomegaly. Bowel sounds positive.  Musculoskeletal:  no clubbing / cyanosis. Good ROM, no contractures. Normal muscle tone.  Skin: no rashes, lesions, ulcers on limited dermatological examination. Neurologic: CN 2-12 grossly intact. Sensation intact, DTR normal. Strength 5/5 in all 4.  Psychiatric: Normal judgment and insight. Alert and oriented x 3. Normal mood.   Labs on Admission: I have personally reviewed following labs and imaging studies  CBC: Recent Labs  Lab 07/16/21 1018  WBC 7.5  HGB 11.0*  HCT 34.2*  MCV 84.4  PLT 810    Basic Metabolic Panel: Recent Labs  Lab 07/16/21 1018  NA 134*  K 4.9  CL 102  CO2 22  GLUCOSE 105*  BUN 34*  CREATININE 1.52*  CALCIUM 9.1    GFR: Estimated Creatinine Clearance: 31.1 mL/min (A) (by C-G formula based on SCr of 1.52 mg/dL (H)).  Liver Function Tests: No results for input(s): AST, ALT, ALKPHOS, BILITOT, PROT, ALBUMIN in the last 168 hours.  Radiological Exams on Admission: DG Chest Port 1 View  Result Date: 07/16/2021 CLINICAL DATA:  Central chest pain beginning around 7 a.m. this morning. EXAM: PORTABLE CHEST 1 VIEW COMPARISON:  06/21/2020. FINDINGS: Stable changes from a prior median sternotomy. Cardiac silhouette is  normal in size. No mediastinal or hilar masses. Lungs are hyperexpanded, but clear. No convincing pleural effusion or pneumothorax. Skeletal structures are grossly intact. IMPRESSION: No acute cardiopulmonary disease. Electronically Signed   By: Lajean Manes M.D.   On: 07/16/2021 11:27    12/10/2019 echocardiogram IMPRESSIONS:   1. Left ventricular ejection fraction, by estimation, is 60 to 65%. The  left ventricle has normal function. The left ventricle has no regional  wall motion abnormalities. Left ventricular diastolic parameters are  indeterminate. Elevated left ventricular  end-diastolic pressure.   2. Right ventricular systolic function is normal. The right ventricular  size is normal. There is mildly elevated pulmonary artery systolic  pressure.   3. The mitral valve is normal in structure. Moderate mitral valve  regurgitation. No evidence of mitral stenosis.   4. The aortic valve is tricuspid. Aortic valve regurgitation is not  visualized. Mild to moderate aortic valve sclerosis/calcification is  present, without any evidence of aortic stenosis.   5. The inferior vena cava is normal in size with greater than 50%  respiratory variability, suggesting right atrial pressure of 3 mmHg.   EKG: Independently reviewed.  EKG #1 Vent. rate 113 BPM PR interval 206 ms QRS duration 92 ms QT/QTcB 296/406 ms P-R-T axes 256 82 246 Sinus or ectopic atrial tachycardia Borderline right axis deviation Repol abnrm, severe global ischemia (LM/MVD  EKG #2 Vent. rate 101 BPM PR interval 190 ms QRS duration 92 ms QT/QTcB 320/415 ms P-R-T axes 253 81 -61 Sinus or ectopic atrial tachycardia Borderline right axis deviation Nonspecific repol abnormality, lateral leads  Assessment/Plan Principal Problem:   Unstable angina (HCC) In the setting of   Hypertensive emergency Observation/telemetry. Continue nitroglycerin infusion. Resume home antihypertensives. Losartan 100 mg p.o.  daily. Metoprolol 75 mg p.o. twice daily. Hydralazine 25 mg p.o. twice daily. Furosemide 40 mg p.o. as needed. Monitor blood pressure, renal function electrolytes. Follow-up repeat troponin level. Serial electrocardiograms. Check echocardiogram. Consult cardiology in AM.  Active Problems:   Dyslipidemia, goal LDL below 70 Continue ezetimibe 10 mg p.o. daily.    CRI (chronic renal insufficiency), stage 3 (moderate) (HCC) Monitor renal function electrolytes.    Normocytic anemia Monitor hematocrit and hemoglobin.     Class 1 obesity Lifestyle modifications. Follow-up with PCP.   DVT prophylaxis:  Lovenox SQ. Code Status:   Full code. Family Communication:   Disposition Plan:   Patient is from:  Home.  Anticipated DC to:  Home.  Anticipated DC date:  07/17/2021 or 07/18/2021.  Anticipated DC barriers: Clinical condition.  Consults called:  Left message for routine cardiology consult. Admission status:  Observation/stepdown. Severity of Illness:  High severity after presenting with chest pain in the setting of hypertensive emergency producing acute coronary syndrome.  The patient will remain for blood pressure control and further work-up.  Reubin Milan MD [facility]  How to contact the [facility] Attending or Consulting provider [place] or covering provider during after hours [place], for this patient?   Check the care team in [facility] and look for a) attending/consulting [facility] provider listed and b) the [facility] team listed Log into [email or web address] and use [place]'s universal password to access. If you do not have the password, please contact the hospital operator. Locate the [facility] provider you are looking for under [facility] and page to a number that you can be directly reached. If you still have difficulty reaching the provider, please page the [facility] (Director on Call) for the Hospitalists listed on amion for assistance.  07/16/2021, 1:05 PM   This document was prepared  using Dragon voice recognition software and may contain some unintended transcription errors.

## 2021-07-17 DIAGNOSIS — Z87891 Personal history of nicotine dependence: Secondary | ICD-10-CM | POA: Diagnosis not present

## 2021-07-17 DIAGNOSIS — I10 Essential (primary) hypertension: Secondary | ICD-10-CM | POA: Diagnosis not present

## 2021-07-17 DIAGNOSIS — R001 Bradycardia, unspecified: Secondary | ICD-10-CM | POA: Diagnosis not present

## 2021-07-17 DIAGNOSIS — N183 Chronic kidney disease, stage 3 unspecified: Secondary | ICD-10-CM

## 2021-07-17 DIAGNOSIS — I161 Hypertensive emergency: Secondary | ICD-10-CM | POA: Diagnosis present

## 2021-07-17 DIAGNOSIS — I2510 Atherosclerotic heart disease of native coronary artery without angina pectoris: Secondary | ICD-10-CM | POA: Diagnosis not present

## 2021-07-17 DIAGNOSIS — D649 Anemia, unspecified: Secondary | ICD-10-CM

## 2021-07-17 DIAGNOSIS — I4892 Unspecified atrial flutter: Secondary | ICD-10-CM | POA: Diagnosis present

## 2021-07-17 DIAGNOSIS — J45909 Unspecified asthma, uncomplicated: Secondary | ICD-10-CM | POA: Diagnosis present

## 2021-07-17 DIAGNOSIS — E871 Hypo-osmolality and hyponatremia: Secondary | ICD-10-CM | POA: Diagnosis present

## 2021-07-17 DIAGNOSIS — Z96641 Presence of right artificial hip joint: Secondary | ICD-10-CM | POA: Diagnosis present

## 2021-07-17 DIAGNOSIS — I724 Aneurysm of artery of lower extremity: Secondary | ICD-10-CM | POA: Diagnosis not present

## 2021-07-17 DIAGNOSIS — I25110 Atherosclerotic heart disease of native coronary artery with unstable angina pectoris: Secondary | ICD-10-CM | POA: Diagnosis present

## 2021-07-17 DIAGNOSIS — E785 Hyperlipidemia, unspecified: Secondary | ICD-10-CM

## 2021-07-17 DIAGNOSIS — Z885 Allergy status to narcotic agent status: Secondary | ICD-10-CM | POA: Diagnosis not present

## 2021-07-17 DIAGNOSIS — I25810 Atherosclerosis of coronary artery bypass graft(s) without angina pectoris: Secondary | ICD-10-CM | POA: Diagnosis not present

## 2021-07-17 DIAGNOSIS — Z7982 Long term (current) use of aspirin: Secondary | ICD-10-CM | POA: Diagnosis not present

## 2021-07-17 DIAGNOSIS — Z6830 Body mass index (BMI) 30.0-30.9, adult: Secondary | ICD-10-CM | POA: Diagnosis not present

## 2021-07-17 DIAGNOSIS — N1832 Chronic kidney disease, stage 3b: Secondary | ICD-10-CM | POA: Diagnosis present

## 2021-07-17 DIAGNOSIS — I480 Paroxysmal atrial fibrillation: Secondary | ICD-10-CM | POA: Diagnosis present

## 2021-07-17 DIAGNOSIS — C9000 Multiple myeloma not having achieved remission: Secondary | ICD-10-CM | POA: Diagnosis present

## 2021-07-17 DIAGNOSIS — I471 Supraventricular tachycardia: Secondary | ICD-10-CM | POA: Diagnosis present

## 2021-07-17 DIAGNOSIS — E669 Obesity, unspecified: Secondary | ICD-10-CM | POA: Diagnosis present

## 2021-07-17 DIAGNOSIS — Z888 Allergy status to other drugs, medicaments and biological substances status: Secondary | ICD-10-CM | POA: Diagnosis not present

## 2021-07-17 DIAGNOSIS — I214 Non-ST elevation (NSTEMI) myocardial infarction: Principal | ICD-10-CM

## 2021-07-17 DIAGNOSIS — D631 Anemia in chronic kidney disease: Secondary | ICD-10-CM | POA: Diagnosis present

## 2021-07-17 DIAGNOSIS — I129 Hypertensive chronic kidney disease with stage 1 through stage 4 chronic kidney disease, or unspecified chronic kidney disease: Secondary | ICD-10-CM | POA: Diagnosis present

## 2021-07-17 DIAGNOSIS — Z951 Presence of aortocoronary bypass graft: Secondary | ICD-10-CM | POA: Diagnosis not present

## 2021-07-17 DIAGNOSIS — Z20822 Contact with and (suspected) exposure to covid-19: Secondary | ICD-10-CM | POA: Diagnosis present

## 2021-07-17 DIAGNOSIS — I200 Unstable angina: Secondary | ICD-10-CM | POA: Diagnosis present

## 2021-07-17 DIAGNOSIS — Z79899 Other long term (current) drug therapy: Secondary | ICD-10-CM | POA: Diagnosis not present

## 2021-07-17 LAB — COMPREHENSIVE METABOLIC PANEL
ALT: 22 U/L (ref 0–44)
AST: 34 U/L (ref 15–41)
Albumin: 3.2 g/dL — ABNORMAL LOW (ref 3.5–5.0)
Alkaline Phosphatase: 66 U/L (ref 38–126)
Anion gap: 11 (ref 5–15)
BUN: 34 mg/dL — ABNORMAL HIGH (ref 8–23)
CO2: 22 mmol/L (ref 22–32)
Calcium: 8.8 mg/dL — ABNORMAL LOW (ref 8.9–10.3)
Chloride: 101 mmol/L (ref 98–111)
Creatinine, Ser: 1.51 mg/dL — ABNORMAL HIGH (ref 0.44–1.00)
GFR, Estimated: 34 mL/min — ABNORMAL LOW (ref >60)
Glucose, Bld: 115 mg/dL — ABNORMAL HIGH (ref 70–99)
Potassium: 3.8 mmol/L (ref 3.5–5.1)
Sodium: 134 mmol/L — ABNORMAL LOW (ref 135–145)
Total Bilirubin: 0.8 mg/dL (ref 0.3–1.2)
Total Protein: 8.4 g/dL — ABNORMAL HIGH (ref 6.5–8.1)

## 2021-07-17 LAB — TROPONIN I (HIGH SENSITIVITY)
Troponin I (High Sensitivity): 1363 ng/L (ref <18)
Troponin I (High Sensitivity): 1341 ng/L (ref <18)
Troponin I (High Sensitivity): 1194 ng/L (ref <18)
Troponin I (High Sensitivity): 519 ng/L (ref <18)

## 2021-07-17 LAB — CBC
HCT: 28.2 % — ABNORMAL LOW (ref 36.0–46.0)
Hemoglobin: 9.2 g/dL — ABNORMAL LOW (ref 12.0–15.0)
MCH: 27 pg (ref 26.0–34.0)
MCHC: 32.6 g/dL (ref 30.0–36.0)
MCV: 82.7 fL (ref 80.0–100.0)
Platelets: 216 10*3/uL (ref 150–400)
RBC: 3.41 MIL/uL — ABNORMAL LOW (ref 3.87–5.11)
RDW: 18.3 % — ABNORMAL HIGH (ref 11.5–15.5)
WBC: 8.1 10*3/uL (ref 4.0–10.5)
nRBC: 0 % (ref 0.0–0.2)

## 2021-07-17 LAB — HEPARIN LEVEL (UNFRACTIONATED): Heparin Unfractionated: 0.65 IU/mL (ref 0.30–0.70)

## 2021-07-17 MED ORDER — MORPHINE SULFATE (PF) 2 MG/ML IV SOLN
2.0000 mg | Freq: Once | INTRAVENOUS | Status: AC | PRN
  Administered 2021-07-17: 2 mg via INTRAVENOUS
  Filled 2021-07-17: qty 1

## 2021-07-17 MED ORDER — LACTATED RINGERS IV BOLUS: 250.0000 mL | Freq: Once | INTRAVENOUS | Status: DC

## 2021-07-17 MED ORDER — SODIUM CHLORIDE 0.9 % IV BOLUS
250.0000 mL | Freq: Once | INTRAVENOUS | Status: AC
  Administered 2021-07-18: 250 mL via INTRAVENOUS

## 2021-07-17 MED ORDER — ENOXAPARIN SODIUM 30 MG/0.3ML IJ SOSY: 30.0000 mg | PREFILLED_SYRINGE | INTRAMUSCULAR | Status: DC

## 2021-07-17 MED ORDER — EVOLOCUMAB 140 MG/ML ~~LOC~~ SOAJ
140.0000 mg | Freq: Once | SUBCUTANEOUS | Status: AC
  Administered 2021-07-17: 140 mg via SUBCUTANEOUS

## 2021-07-17 MED ORDER — METOPROLOL TARTRATE 25 MG PO TABS
25.0000 mg | ORAL_TABLET | Freq: Two times a day (BID) | ORAL | Status: DC
  Administered 2021-07-17 – 2021-07-20 (×7): 25 mg via ORAL
  Filled 2021-07-17 (×7): qty 1

## 2021-07-17 MED ORDER — HYDROCORTISONE 1 % EX CREA
TOPICAL_CREAM | Freq: Four times a day (QID) | CUTANEOUS | Status: DC | PRN
  Administered 2021-07-17: 1 via TOPICAL
  Filled 2021-07-17 (×2): qty 28

## 2021-07-17 MED ORDER — MORPHINE SULFATE (PF) 4 MG/ML IV SOLN
4.0000 mg | Freq: Once | INTRAVENOUS | Status: AC
  Administered 2021-07-18: 4 mg via INTRAVENOUS
  Filled 2021-07-17: qty 1

## 2021-07-17 MED ORDER — ALPRAZOLAM 0.25 MG PO TABS
0.2500 mg | ORAL_TABLET | Freq: Once | ORAL | Status: AC | PRN
  Administered 2021-07-17: 0.25 mg via ORAL
  Filled 2021-07-17: qty 1

## 2021-07-17 MED ORDER — HEPARIN (PORCINE) 25000 UT/250ML-% IV SOLN
900.0000 [IU]/h | INTRAVENOUS | Status: DC
  Administered 2021-07-17 – 2021-07-18 (×2): 900 [IU]/h via INTRAVENOUS
  Filled 2021-07-17 (×2): qty 250

## 2021-07-17 MED ORDER — SODIUM CHLORIDE 0.9 % IV SOLN: INTRAVENOUS | Status: DC

## 2021-07-17 MED ORDER — ATORVASTATIN CALCIUM 40 MG PO TABS
80.0000 mg | ORAL_TABLET | Freq: Every day | ORAL | Status: DC
  Administered 2021-07-17 – 2021-07-18 (×2): 80 mg via ORAL
  Filled 2021-07-17 (×2): qty 2

## 2021-07-17 MED ORDER — SODIUM CHLORIDE 0.9% FLUSH
3.0000 mL | Freq: Two times a day (BID) | INTRAVENOUS | Status: DC
  Administered 2021-07-17 – 2021-07-19 (×4): 3 mL via INTRAVENOUS

## 2021-07-17 MED ORDER — METOPROLOL TARTRATE 5 MG/5ML IV SOLN
5.0000 mg | Freq: Once | INTRAVENOUS | Status: AC
  Administered 2021-07-17: 5 mg via INTRAVENOUS
  Filled 2021-07-17: qty 5

## 2021-07-17 MED ORDER — HEPARIN BOLUS VIA INFUSION
4000.0000 [IU] | Freq: Once | INTRAVENOUS | Status: AC
  Administered 2021-07-17: 4000 [IU] via INTRAVENOUS
  Filled 2021-07-17: qty 4000

## 2021-07-17 NOTE — Progress Notes (Addendum)
Cardiology Consultation:   Patient ID: Tara Potts MRN: [id number]; DOB: 1933/12/26  Admit date: 07/16/2021 Date of Consult: 07/17/2021  PCP:  [name], MD   [facility] Providers Cardiologist:  Quay Burow, MD   {     Patient Profile:   Tara Potts is a 88 y.o. female with a hx of CAD with prior CABG who is being seen 07/17/2021 for the evaluation of chest pain at the request of Dr Louanne Belton.  History of Present Illness:   Tara Potts 88 yo female history of CAD with CABG in 2007 at [facility], HTN, HL, carotid stenosis, CKD 3b.Cath 2009 showed diffusely diseased graft to PDA and diffuse PDA disease, treated medically. Presents with chest pain that started yesterday evening. In ER found to have severely elevated bp with SBPs in the 200s, started on NG drip. Significant troponin elevatoin and EKG changes, cardiology consulted.    ER vitals: p 113 bp 218/95 100% K 4.9 Cr 1.52 BUN 34 GFR 33 WBC 7.5 Hgb 11 Plt 220 Mg 2 Trop neg-->1398-->1363-->1341-->1194 EKG: SR, diffuse ST depression with aVR elevation COVID neg flu neg CXR no acute process Past Medical History:  Diagnosis Date   Anemia    Arthritis    Asthma    as a child   CAD (coronary artery disease)    Carotid disease, bilateral ([place])    mild   Fibroid    Hx of CABG 2007   [facility]   Hyperlipemia    Hypertension    Lumbosacral radiculopathy 09/10/2014   Menopausal symptoms    Multiple myeloma ([place]) 05/26/2018   Peripheral neuropathy    Seizure-like activity ([place])     Past Surgical History:  Procedure Laterality Date   CARDIAC CATHETERIZATION  01/09/2008   diffusely diseased graft to the PDA & diffuse PDA disease   CAROTID DOPPLER  08/27/2011   mod. right & nild left ICA stenosis   CATARACT SURGERY     CORONARY ARTERY BYPASS GRAFT     FOOT SURGERY     JOINT REPLACEMENT     Right hip Dr. Alvan Dame 01-28-18    MASS REMOVED FROM CERVIX  12/1997   Benign endocervical inclusion cysts   NM  MYOVIEW LTD  01/12/2011   no ischemia   TOTAL HIP ARTHROPLASTY Right 01/28/2018   Procedure: RIGHT TOTAL HIP ARTHROPLASTY ANTERIOR APPROACH;  Surgeon: Paralee Cancel, MD;  Location: [facility];  Service: Orthopedics;  Laterality: Right;  70 mins   TRIPLE HEART BYPASS  05/2006   [facility]   US ECHOCARDIOGRAPHY  10/30/2007   mild MA,MR,TR,AOV mildly sclerotic,density oted in the prox asc aorta       Inpatient Medications: Scheduled Meds:  aspirin EC  81 mg Oral Daily   atorvastatin  80 mg Oral Daily   Chlorhexidine Gluconate Cloth  6 each Topical Daily   ezetimibe  10 mg Oral Daily   furosemide  40 mg Oral Daily   heparin  4,000 Units Intravenous Once   hydrALAZINE  50 mg Oral BID   losartan  100 mg Oral Daily   metoprolol tartrate  25 mg Oral BID   potassium chloride  10 mEq Oral Daily   Continuous Infusions:  sodium chloride 20 mL/hr at 07/16/21 1033   heparin     nitroGLYCERIN 50 mcg/min (07/17/21 0327)   PRN Meds: acetaminophen **OR** acetaminophen, hydrALAZINE, ondansetron (ZOFRAN) IV  Allergies:    Allergies  Allergen Reactions   Codeine Nausea And Vomiting  confusion   Cymbalta [Duloxetine Hcl] Nausea Only   Ezetimibe     Other reaction(s): cramping   Gabapentin     Other reaction(s): felt crazy    Social History:   Social History   Socioeconomic History   Marital status: Widowed    Spouse name: Not on file   Number of children: 1   Years of education: college   Highest education level: Not on file  Occupational History   Not on file  Tobacco Use   Smoking status: Former   Smokeless tobacco: Never   Tobacco comments:    Quit 30 years ago  Vaping Use   Vaping Use: Never used  Substance and Sexual Activity   Alcohol use: No    Alcohol/week: 0.0 standard drinks   Drug use: No   Sexual activity: Not Currently    Birth control/protection: Post-menopausal    Comment: intercourse age 50, more than 5 sexual partners  Other Topics Concern   Not on  file  Social History Narrative   Patient lives at home and her daughter and son in law live with her. Widowed.   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   Education college.   Left handed.   Caffeine     Social Determinants of Radio broadcast assistant Strain: Not on file  Food Insecurity: Not on file  Transportation Needs: Not on file  Physical Activity: Not on file  Stress: Not on file  Social Connections: Not on file  Intimate Partner Violence: Not on file    Family History:    Family History  Problem Relation Age of Onset   Diabetes Mother    Hypertension Mother    Heart disease Mother    Heart failure Mother    Hypertension Sister    Breast cancer Maternal Aunt        Age 38   Heart failure Maternal Aunt    Heart failure Maternal Uncle      ROS:  Please see the history of present illness.   All other ROS reviewed and negative.     Physical Exam/Data:   Vitals:   07/17/21 0715 07/17/21 0730 07/17/21 0745 07/17/21 0800  BP: (!) 145/39 (!) 139/39 (!) 128/33 (!) 152/56  Pulse: 76 73 74 78  Resp: _0 Temp:    98 F (36.7 C)  TempSrc:    Oral  SpO2: 97% 97% 98% 95%  Weight:      Height:        Intake/Output Summary (Last 24 hours) at 07/17/2021 0821 Last data filed at 07/17/2021 0544 Gross per 24 hour  Intake 433.28 ml  Output 675 ml  Net -241.72 ml   Last 3 Weights 07/16/2021 07/16/2021 10/03/2020  Weight (lbs) 185 lb 13.6 oz 198 lb 199 lb 1.6 oz  Weight (kg) 84.3 kg 89.812 kg 90.311 kg     Body mass index is 30.93 kg/m.  General:  Well nourished, well developed, in no acute distress HEENT: normal Neck: no JVD Vascular: No carotid bruits; Distal pulses 2+ bilaterally Cardiac:  normal S1, S2; RRR; no murmur  Lungs:  clear to auscultation bilaterally, no wheezing, rhonchi or rales  Abd: soft, nontender, no hepatomegaly  Ext: no edema Musculoskeletal:  No deformities, BUE and BLE strength normal and equal Skin: warm and dry  Neuro:  CNs 2-12  intact, no focal abnormalities noted Psych:  Normal affect     Laboratory Data:  High Sensitivity Troponin:  Recent Labs  Lab 07/16/21 1018 07/16/21 1947 07/17/21 0058 07/17/21 0239 07/17/21 0520  TROPONINIHS 13 1,398* 1,363* 1,341* 1,194*     Chemistry Recent Labs  Lab 07/16/21 1018 07/16/21 2209  NA 134* 137  K 4.9 3.9  CL 102 106  CO2 22 24  GLUCOSE 105* 105*  BUN 34* 37*  CREATININE 1.52* 1.55*  CALCIUM 9.1 8.6*  MG  --  2.0  GFRNONAA 33* 33*  ANIONGAP 10 7    Recent Labs  Lab 07/16/21 2209  PROT 8.4*  ALBUMIN 3.1*  AST 34  ALT 22  ALKPHOS 67  BILITOT 0.6   Lipids No results for input(s): CHOL, TRIG, HDL, LABVLDL, LDLCALC, CHOLHDL in the last 168 hours.  Hematology Recent Labs  Lab 07/16/21 1018  WBC 7.5  RBC 4.05  HGB 11.0*  HCT 34.2*  MCV 84.4  MCH 27.2  MCHC 32.2  RDW 18.4*  PLT 220   Thyroid No results for input(s): TSH, FREET4 in the last 168 hours.  BNPNo results for input(s): BNP, PROBNP in the last 168 hours.  DDimer No results for input(s): DDIMER in the last 168 hours.   Radiology/Studies:  DG Chest Port 1 View  Result Date: 07/16/2021 CLINICAL DATA:  Central chest pain beginning around 7 a.m. this morning. EXAM: PORTABLE CHEST 1 VIEW COMPARISON:  06/21/2020. FINDINGS: Stable changes from a prior median sternotomy. Cardiac silhouette is normal in size. No mediastinal or hilar masses. Lungs are hyperexpanded, but clear. No convincing pleural effusion or pneumothorax. Skeletal structures are grossly intact. IMPRESSION: No acute cardiopulmonary disease. Electronically Signed   By: Lajean Manes M.D.   On: 07/16/2021 11:27   ECHOCARDIOGRAM COMPLETE  Result Date: 07/16/2021    ECHOCARDIOGRAM REPORT   Patient Name:   Tara Potts Date of Exam: 07/16/2021 Medical Rec #:  [id number]     Height:       67.0 in Accession #:    [id number]    Weight:       198.0 lb Date of Birth:  1933-09-21     BSA:          2.014 m Patient Age:    10 years       BP:           217/70 mmHg Patient Gender: F             HR:           72 bpm. Exam Location:  Inpatient Procedure: 2D Echo, Color Doppler and Cardiac Doppler Indications:    R07.9* Chest pain, unspecified; R94.31 Abnormal EKG  History:        Patient has prior history of Echocardiogram examinations, most                 recent 12/10/2019. Prior CABG; Risk Factors:Hypertension and                 Dyslipidemia.  Sonographer:    Raquel Sarna Senior RDCS Referring Phys: [id number] [place]  1. Left ventricular ejection fraction, by estimation, is 55 to 60%. The left ventricle has normal function. The left ventricle has no regional wall motion abnormalities. Left ventricular diastolic parameters are consistent with Grade II diastolic dysfunction (pseudonormalization). Elevated left ventricular end-diastolic pressure.  2. Right ventricular systolic function is normal. The right ventricular size is normal. There is mildly elevated pulmonary artery systolic pressure.  3. Left atrial size was moderately dilated.  4. The mitral valve is normal in structure.  Trivial mitral valve regurgitation. No evidence of mitral stenosis.  5. The aortic valve is calcified. There is mild calcification of the aortic valve. There is mild thickening of the aortic valve. Aortic valve regurgitation is not visualized. No aortic stenosis is present.  6. The inferior vena cava is dilated in size with >50% respiratory variability, suggesting right atrial pressure of 8 mmHg. FINDINGS  Left Ventricle: Left ventricular ejection fraction, by estimation, is 55 to 60%. The left ventricle has normal function. The left ventricle has no regional wall motion abnormalities. The left ventricular internal cavity size was normal in size. There is  no left ventricular hypertrophy. Left ventricular diastolic parameters are consistent with Grade II diastolic dysfunction (pseudonormalization). Elevated left ventricular end-diastolic pressure. Right  Ventricle: The right ventricular size is normal. No increase in right ventricular wall thickness. Right ventricular systolic function is normal. There is mildly elevated pulmonary artery systolic pressure. The tricuspid regurgitant velocity is 2.96  m/s, and with an assumed right atrial pressure of 8 mmHg, the estimated right ventricular systolic pressure is 35.3 mmHg. Left Atrium: Left atrial size was moderately dilated. Right Atrium: Right atrial size was normal in size. Pericardium: There is no evidence of pericardial effusion. Mitral Valve: The mitral valve is normal in structure. Trivial mitral valve regurgitation. No evidence of mitral valve stenosis. MV peak gradient, 11.0 mmHg. The mean mitral valve gradient is 4.0 mmHg. Tricuspid Valve: The tricuspid valve is normal in structure. Tricuspid valve regurgitation is mild . No evidence of tricuspid stenosis. Aortic Valve: The aortic valve is calcified. There is mild calcification of the aortic valve. There is mild thickening of the aortic valve. Aortic valve regurgitation is not visualized. No aortic stenosis is present. Pulmonic Valve: The pulmonic valve was normal in structure. Pulmonic valve regurgitation is not visualized. No evidence of pulmonic stenosis. Aorta: The aortic root is normal in size and structure. Venous: The inferior vena cava is dilated in size with greater than 50% respiratory variability, suggesting right atrial pressure of 8 mmHg. IAS/Shunts: No atrial level shunt detected by color flow Doppler.  LEFT VENTRICLE PLAX 2D LVIDd:         4.90 cm   Diastology LVIDs:         2.70 cm   LV e' medial:    3.81 cm/s LV PW:         0.90 cm   LV E/e' medial:  36.0 LV IVS:        0.80 cm   LV e' lateral:   7.72 cm/s LVOT diam:     1.90 cm   LV E/e' lateral: 17.7 LV SV:         63 LV SV Index:   31 LVOT Area:     2.84 cm  RIGHT VENTRICLE RV S prime:     7.18 cm/s TAPSE (M-mode): 1.7 cm LEFT ATRIUM           Index        RIGHT ATRIUM           Index LA  diam:      4.50 cm 2.23 cm/m   RA Area:     17.80 cm LA Vol (A2C): 74.3 ml 36.90 ml/m  RA Volume:   45.40 ml  22.55 ml/m LA Vol (A4C): 41.4 ml 20.56 ml/m  AORTIC VALVE LVOT Vmax:   103.00 cm/s LVOT Vmean:  68.600 cm/s LVOT VTI:    0.221 m  AORTA Ao Root diam: 2.60 cm MITRAL VALVE  TRICUSPID VALVE MV Area (PHT): 3.95 cm     TR Peak grad:   35.0 mmHg MV Area VTI:   1.76 cm     TR Vmax:        296.00 cm/s MV Peak grad:  11.0 mmHg MV Mean grad:  4.0 mmHg     SHUNTS MV Vmax:       1.66 m/s     Systemic VTI:  0.22 m MV Vmean:      97.8 cm/s    Systemic Diam: 1.90 cm MV Decel Time: 192 msec MV E velocity: 137.00 cm/s MV A velocity: 118.00 cm/s MV E/A ratio:  1.16 Skeet Latch MD Electronically signed by Skeet Latch MD Signature Date/Time: 07/16/2021/3:06:47 PM    Final      Assessment and Plan:  1.NSTEMI/CAD - history of CABG in 2007 - last cath 2009 diseased PDA graft and PDA managed medically - trop peak 1398 trending down, EKG on admission with diffuse ST depression and aVR elevation concerning for diffuse ischemia though f/u EKGs much improved - Jan 2022 echo: LVEF 55-60%, no WMAs, grade II dd, normal RV function  - medical therapy with ASA 81, zetia 10, losartan 100, nitro gtt. Start hep gtt. Lopressor held yesterday after mild brady after receiging 39m, will restart at 241mbid today.She is on repatha as opposed to statin  - discussed cath in detail with paitent and her daughter Tara Gladthey are leaning toward procedure but wish to talk about today and update [place] on today, plan would be cath tomorrow if family in agreement.    2.Hyperlipidemia - followed in lipid clinic - from last note had been on atorva 80, zetia 10. Had not been interested in [id number] atht that time however appears she has since started repatha and is off atorva    3. Hypertensive emergency - initial bp 218/95 on admission, evidence of cardiac ishcemia - started on NG gtt  - she is on  hydralazine 5018mid, losartan 100m37mome lopressor held for bradycardia.    4.Braydcardia - got 75mg45mlopressor yestaerday at 2pm with mild brady to 50s - will restart at lower dose today 25mg 81m    Addendum 1220pm. Family and patient do agree with cath. Start IVF given renal dysfunction, NS at 75mL/h54m18 hours. Orders placed for cath.     Shared Decision Making/Informed Consent{  The risks [stroke (1 in 1000), death (1 in 1000), kidney failure [usually temporary] (1 in 500), bleeding (1 in 200), allergic reaction [possibly serious] (1 in 200)], benefits (diagnostic support and management of coronary artery disease) and alternatives of a cardiac catheterization were discussed in detail with Ms. Pinheiro Drume is willing to proceed.     Risk Assessment/Risk Scores:    TIMI Risk Score for Unstable Angina or Non-ST Elevation MI:   The patient's TIMI risk score is 7, which indicates a 41% risk of all cause mortality, new or recurrent myocardial infarction or need for urgent revascularization in the next 14 days.{   For questions or updates, please contact [facility] consult [email or web address] for contact info under    Signed, Naidelyn Parrella,Carlyle Dolly/08/2021 8:21 AM

## 2021-07-17 NOTE — Progress Notes (Signed)
PROGRESS NOTE  Tara Potts TDD:[id number] DOB: 1933-09-13 DOA: 07/16/2021 PCP: [name], MD   LOS: 0 days   Brief narrative:   Tara Potts is a 88 y.o. female with past medical history of anemia,  asthma, CAD, hyperlipidemia, hypertension, CKD stage III, multiple myeloma presented to hospital with chest pain without dyspnea or cough fever or shortness of breath.  In the ED patient was mildly tachycardic.  Patient was given aspirin and nitroglycerin infusion for chest pain and elevated blood pressure at 2018/95.  Initial labs showed normal CBC with normal first troponin, creatinine 1.52 mg/dL.  Chest x-ray showed no evidence of congestion.  Patient was then admitted to the hospital for further evaluation and treatment.  Principal Problem:   NSTEMI (non-ST elevated myocardial infarction) ([place]) Active Problems:   Dyslipidemia, goal LDL below 70   Class 1 obesity   CRI (chronic renal insufficiency), stage 3 (moderate) (HCC)   Hypertensive emergency   Normocytic anemia    Non-ST elevation MI. Chest pain with with elevated troponins.  History of CAD CABG in 2007.  Cardiology on board.  Troponin peak at 1398.  EKG showed diffuse ST segment depression.  Continue aspirin Zetia losartan nitro drip.  Metoprolol was hold yesterday but has been restarted today.  Family has decided to proceed with cardiac catheterization.  Continue heparin drip    Hypertensive emergency Initial blood pressure of 218/95 on nitroglycerin drip,  continue beta-blockers losartan hydralazine.  Patient is better controlled at this time.     Dyslipidemia, goal LDL below 70 Continue ezetimibe 10 mg p.o. daily.  Off atorvastatin.  Recently been started on Repatha.   CKd stage III. Monitor renal function electrolytes.  Continue on gentle IV fluids for cath preparation.     Normocytic anemia Continue to monitor closely.  DVT prophylaxis:   Heparin drip  Code Status: Full code  Family Communication: Spoke with  the patient at bedside.  Status is: Observation  The patient will require care spanning > 2 midnights and should be moved to inpatient because: Need for cardiac catheterization.    Consultants: Cardiology  Procedures: None so far  Anti-infectives:  None  Anti-infectives (From admission, onward)    None      Subjective: Today, patient was seen and examined at bedside.  Complains of mild chest discomfort.  Denies shortness of breath.  Denies any nausea vomiting.  Objective: Vitals:   07/17/21 1130 07/17/21 1200  BP: (!) 131/24   Pulse: 66   Resp: 16   Temp:  97.9 F (36.6 C)  SpO2: 100%     Intake/Output Summary (Last 24 hours) at 07/17/2021 1248 Last data filed at 07/17/2021 1100 Gross per 24 hour  Intake 1079.89 ml  Output 675 ml  Net 404.89 ml   Filed Weights   07/16/21 1001 07/16/21 1850  Weight: 89.8 kg 84.3 kg   Body mass index is 30.93 kg/m.   Physical Exam: GENERAL: Patient is alert awake and oriented. Not in obvious distress.  Elderly female, mildly obese, HENT: No scleral pallor or icterus. Pupils equally reactive to light. Oral mucosa is moist NECK: is supple, no gross swelling noted. CHEST: Clear to auscultation. No crackles or wheezes.  Diminished breath sounds bilaterally. CVS: S1 and S2 heard, no murmur. Regular rate and rhythm.  ABDOMEN: Soft, non-tender, bowel sounds are present. EXTREMITIES: No edema. CNS: Cranial nerves are intact. No focal motor deficits. SKIN: warm and dry without rashes.  Data Review: I have personally reviewed the  following laboratory data and studies,  CBC: Recent Labs  Lab 07/16/21 1018 07/17/21 0857  WBC 7.5 8.1  HGB 11.0* 9.2*  HCT 34.2* 28.2*  MCV 84.4 82.7  PLT 220 782   Basic Metabolic Panel: Recent Labs  Lab 07/16/21 1018 07/16/21 2209 07/17/21 0858  NA 134* 137 134*  K 4.9 3.9 3.8  CL 102 106 101  CO2 _0 GLUCOSE 105* 105* 115*  BUN 34* 37* 34*  CREATININE 1.52* 1.55* 1.51*   CALCIUM 9.1 8.6* 8.8*  MG  --  2.0  --    Liver Function Tests: Recent Labs  Lab 07/16/21 2209 07/17/21 0858  AST 34 34  ALT 22 22  ALKPHOS 67 66  BILITOT 0.6 0.8  PROT 8.4* 8.4*  ALBUMIN 3.1* 3.2*   No results for input(s): LIPASE, AMYLASE in the last 168 hours. No results for input(s): AMMONIA in the last 168 hours. Cardiac Enzymes: No results for input(s): CKTOTAL, CKMB, CKMBINDEX, TROPONINI in the last 168 hours. BNP (last 3 results) No results for input(s): BNP in the last 8760 hours.  ProBNP (last 3 results) No results for input(s): PROBNP in the last 8760 hours.  CBG: No results for input(s): GLUCAP in the last 168 hours. Recent Results (from the past 240 hour(s))  Resp Panel by RT-PCR (Flu A&B, Covid) Nasopharyngeal Swab     Status: None   Collection Time: 07/16/21 10:18 AM   Specimen: Nasopharyngeal Swab; Nasopharyngeal(NP) swabs in vial transport medium  Result Value Ref Range Status   SARS Coronavirus 2 by RT PCR NEGATIVE NEGATIVE Final    Comment: (NOTE) SARS-CoV-2 target nucleic acids are NOT DETECTED.  The SARS-CoV-2 RNA is generally detectable in upper respiratory specimens during the acute phase of infection. The lowest concentration of SARS-CoV-2 viral copies this assay can detect is 138 copies/mL. A negative result does not preclude SARS-Cov-2 infection and should not be used as the sole basis for treatment or other patient management decisions. A negative result may occur with  improper specimen collection/handling, submission of specimen other than nasopharyngeal swab, presence of viral mutation(s) within the areas targeted by this assay, and inadequate number of viral copies(<138 copies/mL). A negative result must be combined with clinical observations, patient history, and epidemiological information. The expected result is Negative.  Fact Sheet for Patients:  EntrepreneurPulse.com.au  Fact Sheet for Healthcare Providers:   IncredibleEmployment.be  This test is no t yet approved or cleared by the [place] FDA and  has been authorized for detection and/or diagnosis of SARS-CoV-2 by FDA under an Emergency Use Authorization (EUA). This EUA will remain  in effect (meaning this test can be used) for the duration of the COVID-19 declaration under Section 564(b)(1) of the Act, 21 U.S.C.section 360bbb-3(b)(1), unless the authorization is terminated  or revoked sooner.       Influenza A by PCR NEGATIVE NEGATIVE Final   Influenza B by PCR NEGATIVE NEGATIVE Final    Comment: (NOTE) The Xpert Xpress SARS-CoV-2/FLU/RSV plus assay is intended as an aid in the diagnosis of influenza from Nasopharyngeal swab specimens and should not be used as a sole basis for treatment. Nasal washings and aspirates are unacceptable for Xpert Xpress SARS-CoV-2/FLU/RSV testing.  Fact Sheet for Patients: EntrepreneurPulse.com.au  Fact Sheet for Healthcare Providers: IncredibleEmployment.be  This test is not yet approved or cleared by the [place] FDA and has been authorized for detection and/or diagnosis of SARS-CoV-2 by FDA under an Emergency Use Authorization (EUA). This EUA will  remain in effect (meaning this test can be used) for the duration of the COVID-19 declaration under Section 564(b)(1) of the Act, 21 U.S.C. section 360bbb-3(b)(1), unless the authorization is terminated or revoked.  Performed at [facility], [employer] [address] [place], [place] [address]   MRSA Next Gen by PCR, Nasal     Status: None   Collection Time: 07/16/21  7:00 PM   Specimen: Nasal Mucosa; Nasal Swab  Result Value Ref Range Status   MRSA by PCR Next Gen NOT DETECTED NOT DETECTED Final    Comment: (NOTE) The GeneXpert MRSA Assay (FDA approved for NASAL specimens only), is one component of a comprehensive MRSA colonization surveillance program. It is not intended  to diagnose MRSA infection nor to guide or monitor treatment for MRSA infections. Test performance is not FDA approved in patients less than 69 years old. Performed at [facility], [place] [address] [place], [place] [address]      Studies: DG Chest Port 1 View  Result Date: 07/16/2021 CLINICAL DATA:  Central chest pain beginning around 7 a.m. this morning. EXAM: PORTABLE CHEST 1 VIEW COMPARISON:  06/21/2020. FINDINGS: Stable changes from a prior median sternotomy. Cardiac silhouette is normal in size. No mediastinal or hilar masses. Lungs are hyperexpanded, but clear. No convincing pleural effusion or pneumothorax. Skeletal structures are grossly intact. IMPRESSION: No acute cardiopulmonary disease. Electronically Signed   By: Lajean Manes M.D.   On: 07/16/2021 11:27   ECHOCARDIOGRAM COMPLETE  Result Date: 07/16/2021    ECHOCARDIOGRAM REPORT   Patient Name:   Tara Potts Date of Exam: 07/16/2021 Medical Rec #:  [id number]     Height:       67.0 in Accession #:    [id number]    Weight:       198.0 lb Date of Birth:  [birth year]     BSA:          2.014 m Patient Age:    16 years      BP:           217/70 mmHg Patient Gender: F             HR:           72 bpm. Exam Location:  Inpatient Procedure: 2D Echo, Color Doppler and Cardiac Doppler Indications:    R07.9* Chest pain, unspecified; R94.31 Abnormal EKG  History:        Patient has prior history of Echocardiogram examinations, most                 recent 12/10/2019. Prior CABG; Risk Factors:Hypertension and                 Dyslipidemia.  Sonographer:    Raquel Sarna Senior RDCS Referring Phys: [id number] [place]  1. Left ventricular ejection fraction, by estimation, is 55 to 60%. The left ventricle has normal function. The left ventricle has no regional wall motion abnormalities. Left ventricular diastolic parameters are consistent with Grade II diastolic dysfunction (pseudonormalization). Elevated left ventricular  end-diastolic pressure.  2. Right ventricular systolic function is normal. The right ventricular size is normal. There is mildly elevated pulmonary artery systolic pressure.  3. Left atrial size was moderately dilated.  4. The mitral valve is normal in structure. Trivial mitral valve regurgitation. No evidence of mitral stenosis.  5. The aortic valve is calcified. There is mild calcification of the aortic valve. There is mild thickening of the aortic valve. Aortic  valve regurgitation is not visualized. No aortic stenosis is present.  6. The inferior vena cava is dilated in size with >50% respiratory variability, suggesting right atrial pressure of 8 mmHg. FINDINGS  Left Ventricle: Left ventricular ejection fraction, by estimation, is 55 to 60%. The left ventricle has normal function. The left ventricle has no regional wall motion abnormalities. The left ventricular internal cavity size was normal in size. There is  no left ventricular hypertrophy. Left ventricular diastolic parameters are consistent with Grade II diastolic dysfunction (pseudonormalization). Elevated left ventricular end-diastolic pressure. Right Ventricle: The right ventricular size is normal. No increase in right ventricular wall thickness. Right ventricular systolic function is normal. There is mildly elevated pulmonary artery systolic pressure. The tricuspid regurgitant velocity is 2.96  m/s, and with an assumed right atrial pressure of 8 mmHg, the estimated right ventricular systolic pressure is 94.1 mmHg. Left Atrium: Left atrial size was moderately dilated. Right Atrium: Right atrial size was normal in size. Pericardium: There is no evidence of pericardial effusion. Mitral Valve: The mitral valve is normal in structure. Trivial mitral valve regurgitation. No evidence of mitral valve stenosis. MV peak gradient, 11.0 mmHg. The mean mitral valve gradient is 4.0 mmHg. Tricuspid Valve: The tricuspid valve is normal in structure. Tricuspid valve  regurgitation is mild . No evidence of tricuspid stenosis. Aortic Valve: The aortic valve is calcified. There is mild calcification of the aortic valve. There is mild thickening of the aortic valve. Aortic valve regurgitation is not visualized. No aortic stenosis is present. Pulmonic Valve: The pulmonic valve was normal in structure. Pulmonic valve regurgitation is not visualized. No evidence of pulmonic stenosis. Aorta: The aortic root is normal in size and structure. Venous: The inferior vena cava is dilated in size with greater than 50% respiratory variability, suggesting right atrial pressure of 8 mmHg. IAS/Shunts: No atrial level shunt detected by color flow Doppler.  LEFT VENTRICLE PLAX 2D LVIDd:         4.90 cm   Diastology LVIDs:         2.70 cm   LV e' medial:    3.81 cm/s LV PW:         0.90 cm   LV E/e' medial:  36.0 LV IVS:        0.80 cm   LV e' lateral:   7.72 cm/s LVOT diam:     1.90 cm   LV E/e' lateral: 17.7 LV SV:         63 LV SV Index:   31 LVOT Area:     2.84 cm  RIGHT VENTRICLE RV S prime:     7.18 cm/s TAPSE (M-mode): 1.7 cm LEFT ATRIUM           Index        RIGHT ATRIUM           Index LA diam:      4.50 cm 2.23 cm/m   RA Area:     17.80 cm LA Vol (A2C): 74.3 ml 36.90 ml/m  RA Volume:   45.40 ml  22.55 ml/m LA Vol (A4C): 41.4 ml 20.56 ml/m  AORTIC VALVE LVOT Vmax:   103.00 cm/s LVOT Vmean:  68.600 cm/s LVOT VTI:    0.221 m  AORTA Ao Root diam: 2.60 cm MITRAL VALVE                TRICUSPID VALVE MV Area (PHT): 3.95 cm     TR Peak grad:   35.0 mmHg MV  Area VTI:   1.76 cm     TR Vmax:        296.00 cm/s MV Peak grad:  11.0 mmHg MV Mean grad:  4.0 mmHg     SHUNTS MV Vmax:       1.66 m/s     Systemic VTI:  0.22 m MV Vmean:      97.8 cm/s    Systemic Diam: 1.90 cm MV Decel Time: 192 msec MV E velocity: 137.00 cm/s MV A velocity: 118.00 cm/s MV E/A ratio:  1.16 Skeet Latch MD Electronically signed by Skeet Latch MD Signature Date/Time: 07/16/2021/3:06:47 PM    Final       Flora Lipps, MD  [facility] 07/17/2021  If 7PM-7AM, please contact night-coverage

## 2021-07-17 NOTE — Progress Notes (Signed)
ANTICOAGULATION CONSULT NOTE - Initial Consult  Pharmacy Consult for Heparin Indication: chest pain/ACS  Allergies  Allergen Reactions   Codeine Nausea And Vomiting    confusion   Cymbalta [Duloxetine Hcl] Nausea Only   Ezetimibe     Other reaction(s): cramping   Gabapentin     Other reaction(s): felt crazy    Patient Measurements: Height: '5\' 5"'  (165.1 cm) Weight: 84.3 kg (185 lb 13.6 oz) IBW/kg (Calculated) : 57 Heparin Dosing Weight: 75 kg  Vital Signs: Temp: 97.8 F (36.6 C) (01/02 0400) Temp Source: Oral (01/02 0400) BP: 128/33 (01/02 0745) Pulse Rate: 74 (01/02 0745)  Labs: Recent Labs    07/16/21 1018 07/16/21 1947 07/16/21 2209 07/17/21 0058 07/17/21 0239 07/17/21 0520  HGB 11.0*  --   --   --   --   --   HCT 34.2*  --   --   --   --   --   PLT 220  --   --   --   --   --   CREATININE 1.52*  --  1.55*  --   --   --   TROPONINIHS 13   < >  --  1,363* 1,341* 1,194*   < > = values in this interval not displayed.    Estimated Creatinine Clearance: 28.4 mL/min (A) (by C-G formula based on SCr of 1.55 mg/dL (H)).   Medical History: Past Medical History:  Diagnosis Date   Anemia    Arthritis    Asthma    as a child   CAD (coronary artery disease)    Carotid disease, bilateral ([place])    mild   Fibroid    Hx of CABG 2007   [facility]   Hyperlipemia    Hypertension    Lumbosacral radiculopathy 09/10/2014   Menopausal symptoms    Multiple myeloma ([place]) 05/26/2018   Peripheral neuropathy    Seizure-like activity (HCC)     Medications:  Scheduled:   aspirin EC  81 mg Oral Daily   Chlorhexidine Gluconate Cloth  6 each Topical Daily   ezetimibe  10 mg Oral Daily   furosemide  40 mg Oral Daily   heparin  4,000 Units Intravenous Once   hydrALAZINE  50 mg Oral BID   losartan  100 mg Oral Daily   potassium chloride  10 mEq Oral Daily   Infusions:   sodium chloride 20 mL/hr at 07/16/21 1033   heparin     nitroGLYCERIN 50 mcg/min (07/17/21  0327)    Assessment: 87 yoF admitted on 1/1 with chest pain.  PMH includes CAD, HTN, HLD, carotid stenosis, CKD3, CABG 2007, Cath 2009.  She was started on NTG drip in ED for HTN emergency.  Pharmacy is now consulted to begin Heparin IV.   No Prior to admission anticoag; received Lovenox 40 mg SQ on 1/1 at ~18:00.  CBC: Hgb decreased to 9.2 (previously 11) SCr remains elevated/stable at 1.51 Troponin: 13, 1398, 1363, 1341, 1194  Goal of Therapy:  Heparin level 0.3-0.7 units/ml Monitor platelets by anticoagulation protocol: Yes   Plan:  Give heparin 4000 units bolus IV x 1 Start heparin IV infusion at 900 units/hr Heparin level 8 hours after starting Daily heparin level and CBC   Gretta Arab PharmD, BCPS [facility] main pharmacy [phone] 07/17/2021,8:05 AM

## 2021-07-17 NOTE — Progress Notes (Signed)
ANTICOAGULATION CONSULT NOTE - Initial Consult  Pharmacy Consult for Heparin Indication: chest pain/ACS  Allergies  Allergen Reactions   Codeine Nausea And Vomiting    confusion   Cymbalta [Duloxetine Hcl] Nausea Only   Ezetimibe     Other reaction(s): cramping   Gabapentin     Other reaction(s): felt crazy    Patient Measurements: Height: 5' 5" (165.1 cm) Weight: 84.3 kg (185 lb 13.6 oz) IBW/kg (Calculated) : 57 Heparin Dosing Weight: 75 kg  Vital Signs: Temp: 98.5 F (36.9 C) (01/02 1600) Temp Source: Oral (01/02 1600) BP: 168/29 (01/02 1615) Pulse Rate: 82 (01/02 1615)  Labs: Recent Labs    07/16/21 1018 07/16/21 1947 07/16/21 2209 07/17/21 0058 07/17/21 0239 07/17/21 0520 07/17/21 0857 07/17/21 0858 07/17/21 1639  HGB 11.0*  --   --   --   --   --  9.2*  --   --   HCT 34.2*  --   --   --   --   --  28.2*  --   --   PLT 220  --   --   --   --   --  216  --   --   HEPARINUNFRC  --   --   --   --   --   --   --   --  0.65  CREATININE 1.52*  --  1.55*  --   --   --   --  1.51*  --   TROPONINIHS 13   < >  --  1,363* 1,341* 1,194*  --   --   --    < > = values in this interval not displayed.     Estimated Creatinine Clearance: 29.2 mL/min (A) (by C-G formula based on SCr of 1.51 mg/dL (H)).   Medical History: Past Medical History:  Diagnosis Date   Anemia    Arthritis    Asthma    as a child   CAD (coronary artery disease)    Carotid disease, bilateral ([place])    mild   Fibroid    Hx of CABG 2007   [facility]   Hyperlipemia    Hypertension    Lumbosacral radiculopathy 09/10/2014   Menopausal symptoms    Multiple myeloma ([place]) 05/26/2018   Peripheral neuropathy    Seizure-like activity (HCC)     Medications:  Scheduled:   aspirin EC  81 mg Oral Daily   atorvastatin  80 mg Oral Daily   Chlorhexidine Gluconate Cloth  6 each Topical Daily   ezetimibe  10 mg Oral Daily   furosemide  40 mg Oral Daily   hydrALAZINE  50 mg Oral BID    losartan  100 mg Oral Daily   metoprolol tartrate  25 mg Oral BID   potassium chloride  10 mEq Oral Daily   sodium chloride flush  3 mL Intravenous Q12H   Infusions:   sodium chloride 20 mL/hr at 07/16/21 1033   sodium chloride 75 mL/hr at 07/17/21 1700   heparin 900 Units/hr (07/17/21 1700)   nitroGLYCERIN 10 mcg/min (07/17/21 1700)    Assessment: 87 yoF admitted on 1/1 with chest pain.  PMH includes CAD, HTN, HLD, carotid stenosis, CKD3, CABG 2007, Cath 2009.  She was started on NTG drip in ED for HTN emergency.  Today, Pharmacy consulted to begin Heparin IV.   No Prior to admission anticoag; received Lovenox 40 mg SQ on 1/1 at ~18:00.  CBC: Hgb decreased to 9.2 (previously  11) SCr remains elevated/stable at 1.51 Troponin: 13, 1398, 1363, 1341, 1194  This evening, Heparin level 0.65, therapeutic, on heparin infusing at 900 units/hr No bleeding or heparin infusion line issues per nurse  Goal of Therapy:  Heparin level 0.3-0.7 units/ml Monitor platelets by anticoagulation protocol: Yes   Plan:  Continue heparin IV infusion at 900 units/hr Confirmatory 8 hour heparin level Monitor daily heparin level, CBC, signs/symptoms of bleeding  Royetta Asal, PharmD, [place] Please utilize Amion for appropriate phone number to reach the unit pharmacist ([place]) 07/17/2021 7:13 PM

## 2021-07-17 NOTE — Progress Notes (Addendum)
° °  ° °                 CROSS COVER NOTE  NAME: Tara Potts MRN: [id number] DOB : Jan 25, 1934  Secure chat received from nurse reporting patient having 7/10 substernal chest pain that is radiating to her right shoulder.  Patient is tachycardic to the 130s-140s.  Per chart review this chest pain is not new and patient is due to be cathed tomorrow. 2 mg IV morphine ordered for chest pain.  Patient assessed at bedside and still reporting chest pain that radiates to both shoulders.  Patient unable to quantify pain for me on a scale of 1-10 however she does report the 2 mg of IV morphine given prior to my bedside assessment did not relieve the pain.  Patient denies shortness of breath, she does endorse palpitations.  EKG showed supraventricular tachycardia. Troponin 519 down from 1194 this morning.  2230: 2L oxygen applied via nasal cannula, 5 mg IV metoprolol ordered for rate control, titrate nitroglycerin drip for relief of chest pain as tolerated, 4 mg of morphine ordered for midnight.  4142: Patient remains tachycardic with HR in 130s-140s after 5mg  IV metoprolol and 250 mL bolus.  Another 5mg  of IV metoprolol ordered.   Neomia Glass MHA, MSN, FNP-BC Nurse Practitioner [facility] Pager [phone]

## 2021-07-18 ENCOUNTER — Inpatient Hospital Stay (HOSPITAL_COMMUNITY)
Admission: EM | Disposition: A | Discharge status: Home Health | Payer: Self-pay | Source: Home / Self Care | Attending: Internal Medicine

## 2021-07-18 DIAGNOSIS — I471 Supraventricular tachycardia: Secondary | ICD-10-CM

## 2021-07-18 DIAGNOSIS — I25810 Atherosclerosis of coronary artery bypass graft(s) without angina pectoris: Secondary | ICD-10-CM | POA: Diagnosis not present

## 2021-07-18 DIAGNOSIS — I214 Non-ST elevation (NSTEMI) myocardial infarction: Secondary | ICD-10-CM | POA: Diagnosis not present

## 2021-07-18 DIAGNOSIS — E669 Obesity, unspecified: Secondary | ICD-10-CM | POA: Diagnosis not present

## 2021-07-18 DIAGNOSIS — I10 Essential (primary) hypertension: Secondary | ICD-10-CM

## 2021-07-18 DIAGNOSIS — E785 Hyperlipidemia, unspecified: Secondary | ICD-10-CM | POA: Diagnosis not present

## 2021-07-18 DIAGNOSIS — I2510 Atherosclerotic heart disease of native coronary artery without angina pectoris: Secondary | ICD-10-CM | POA: Diagnosis not present

## 2021-07-18 DIAGNOSIS — N183 Chronic kidney disease, stage 3 unspecified: Secondary | ICD-10-CM | POA: Diagnosis not present

## 2021-07-18 HISTORY — PX: LEFT HEART CATH AND CORS/GRAFTS ANGIOGRAPHY: CATH118250

## 2021-07-18 LAB — CBC
HCT: 25.8 % — ABNORMAL LOW (ref 36.0–46.0)
HCT: 28.4 % — ABNORMAL LOW (ref 36.0–46.0)
Hemoglobin: 8.5 g/dL — ABNORMAL LOW (ref 12.0–15.0)
Hemoglobin: 9 g/dL — ABNORMAL LOW (ref 12.0–15.0)
MCH: 27.4 pg (ref 26.0–34.0)
MCH: 26.5 pg (ref 26.0–34.0)
MCHC: 32.9 g/dL (ref 30.0–36.0)
MCHC: 31.7 g/dL (ref 30.0–36.0)
MCV: 83.2 fL (ref 80.0–100.0)
MCV: 83.8 fL (ref 80.0–100.0)
Platelets: 193 10*3/uL (ref 150–400)
Platelets: 212 10*3/uL (ref 150–400)
RBC: 3.1 MIL/uL — ABNORMAL LOW (ref 3.87–5.11)
RBC: 3.39 MIL/uL — ABNORMAL LOW (ref 3.87–5.11)
RDW: 18.3 % — ABNORMAL HIGH (ref 11.5–15.5)
RDW: 18.6 % — ABNORMAL HIGH (ref 11.5–15.5)
WBC: 8.1 10*3/uL (ref 4.0–10.5)
WBC: 5.7 10*3/uL (ref 4.0–10.5)
nRBC: 0 % (ref 0.0–0.2)
nRBC: 0 % (ref 0.0–0.2)

## 2021-07-18 LAB — COMPREHENSIVE METABOLIC PANEL
ALT: 19 U/L (ref 0–44)
AST: 28 U/L (ref 15–41)
Albumin: 2.9 g/dL — ABNORMAL LOW (ref 3.5–5.0)
Alkaline Phosphatase: 54 U/L (ref 38–126)
Anion gap: 7 (ref 5–15)
BUN: 32 mg/dL — ABNORMAL HIGH (ref 8–23)
CO2: 20 mmol/L — ABNORMAL LOW (ref 22–32)
Calcium: 7.8 mg/dL — ABNORMAL LOW (ref 8.9–10.3)
Chloride: 104 mmol/L (ref 98–111)
Creatinine, Ser: 1.61 mg/dL — ABNORMAL HIGH (ref 0.44–1.00)
GFR, Estimated: 31 mL/min — ABNORMAL LOW (ref >60)
Glucose, Bld: 127 mg/dL — ABNORMAL HIGH (ref 70–99)
Potassium: 3.7 mmol/L (ref 3.5–5.1)
Sodium: 131 mmol/L — ABNORMAL LOW (ref 135–145)
Total Bilirubin: 0.7 mg/dL (ref 0.3–1.2)
Total Protein: 7.5 g/dL (ref 6.5–8.1)

## 2021-07-18 LAB — CREATININE, SERUM
Creatinine, Ser: 1.72 mg/dL — ABNORMAL HIGH (ref 0.44–1.00)
GFR, Estimated: 29 mL/min — ABNORMAL LOW (ref >60)

## 2021-07-18 LAB — HEPARIN LEVEL (UNFRACTIONATED)
Heparin Unfractionated: 0.66 IU/mL (ref 0.30–0.70)
Heparin Unfractionated: 0.91 IU/mL — ABNORMAL HIGH (ref 0.30–0.70)

## 2021-07-18 LAB — POCT ACTIVATED CLOTTING TIME
Activated Clotting Time: 179 seconds
Activated Clotting Time: 185 seconds

## 2021-07-18 LAB — MAGNESIUM: Magnesium: 1.9 mg/dL (ref 1.7–2.4)

## 2021-07-18 SURGERY — LEFT HEART CATH AND CORS/GRAFTS ANGIOGRAPHY
Anesthesia: LOCAL

## 2021-07-18 MED ORDER — SODIUM CHLORIDE 0.9 % IV SOLN: 250.0000 mL | INTRAVENOUS | Status: DC | PRN

## 2021-07-18 MED ORDER — ASPIRIN 81 MG PO CHEW: 81.0000 mg | CHEWABLE_TABLET | ORAL | Status: AC

## 2021-07-18 MED ORDER — ADENOSINE 6 MG/2ML IV SOLN
6.0000 mg | Freq: Once | INTRAVENOUS | Status: AC
  Administered 2021-07-18: 6 mg via INTRAVENOUS
  Filled 2021-07-18: qty 2

## 2021-07-18 MED ORDER — SODIUM CHLORIDE 0.9% FLUSH: 3.0000 mL | INTRAVENOUS | Status: DC | PRN

## 2021-07-18 MED ORDER — FENTANYL CITRATE (PF) 100 MCG/2ML IJ SOLN
INTRAMUSCULAR | Status: DC | PRN
  Administered 2021-07-18: 25 ug via INTRAVENOUS

## 2021-07-18 MED ORDER — LIDOCAINE HCL (PF) 1 % IJ SOLN
INTRAMUSCULAR | Status: DC | PRN
  Administered 2021-07-18: 7 mL
  Administered 2021-07-18: 15 mL

## 2021-07-18 MED ORDER — CLOPIDOGREL BISULFATE 75 MG PO TABS
ORAL_TABLET | ORAL | Status: AC
  Filled 2021-07-18: qty 1

## 2021-07-18 MED ORDER — MIDAZOLAM HCL 2 MG/2ML IJ SOLN
INTRAMUSCULAR | Status: AC
  Filled 2021-07-18: qty 2

## 2021-07-18 MED ORDER — SODIUM CHLORIDE 0.9% FLUSH
3.0000 mL | Freq: Two times a day (BID) | INTRAVENOUS | Status: DC
  Administered 2021-07-19 (×2): 3 mL via INTRAVENOUS

## 2021-07-18 MED ORDER — ENOXAPARIN SODIUM 30 MG/0.3ML IJ SOSY: 30.0000 mg | PREFILLED_SYRINGE | INTRAMUSCULAR | Status: DC

## 2021-07-18 MED ORDER — ACETAMINOPHEN 325 MG PO TABS: 650.0000 mg | ORAL_TABLET | ORAL | Status: DC | PRN

## 2021-07-18 MED ORDER — HEPARIN SODIUM (PORCINE) 1000 UNIT/ML IJ SOLN
INTRAMUSCULAR | Status: AC
  Filled 2021-07-18: qty 10

## 2021-07-18 MED ORDER — DILTIAZEM HCL-DEXTROSE 125-5 MG/125ML-% IV SOLN (PREMIX): 5.0000 mg/h | INTRAVENOUS | Status: DC

## 2021-07-18 MED ORDER — HEPARIN (PORCINE) IN NACL 1000-0.9 UT/500ML-% IV SOLN
INTRAVENOUS | Status: DC | PRN
  Administered 2021-07-18 (×2): 500 mL

## 2021-07-18 MED ORDER — FENTANYL CITRATE (PF) 100 MCG/2ML IJ SOLN
INTRAMUSCULAR | Status: AC
  Filled 2021-07-18: qty 2

## 2021-07-18 MED ORDER — DILTIAZEM LOAD VIA INFUSION
5.0000 mg | Freq: Once | INTRAVENOUS | Status: AC
Start: 2021-07-18 — End: 2021-07-18
  Administered 2021-07-18: 5 mg via INTRAVENOUS
  Filled 2021-07-18: qty 5

## 2021-07-18 MED ORDER — SODIUM CHLORIDE 0.9 % WEIGHT BASED INFUSION: 1.0000 mL/kg/h | INTRAVENOUS | Status: DC

## 2021-07-18 MED ORDER — IOHEXOL 350 MG/ML SOLN
INTRAVENOUS | Status: DC | PRN
  Administered 2021-07-18: 125 mL

## 2021-07-18 MED ORDER — SODIUM CHLORIDE 0.9 % WEIGHT BASED INFUSION: 3.0000 mL/kg/h | INTRAVENOUS | Status: DC

## 2021-07-18 MED ORDER — VERAPAMIL HCL 2.5 MG/ML IV SOLN
INTRAVENOUS | Status: AC
  Filled 2021-07-18: qty 2

## 2021-07-18 MED ORDER — HEPARIN (PORCINE) IN NACL 1000-0.9 UT/500ML-% IV SOLN
INTRAVENOUS | Status: AC
  Filled 2021-07-18: qty 1000

## 2021-07-18 MED ORDER — ASPIRIN 81 MG PO CHEW: 81.0000 mg | CHEWABLE_TABLET | ORAL | Status: DC

## 2021-07-18 MED ORDER — SODIUM CHLORIDE 0.9 % IV SOLN: INTRAVENOUS | Status: AC

## 2021-07-18 MED ORDER — HEPARIN (PORCINE) 25000 UT/250ML-% IV SOLN: 750.0000 [IU]/h | INTRAVENOUS | Status: DC

## 2021-07-18 MED ORDER — MIDAZOLAM HCL 2 MG/2ML IJ SOLN
INTRAMUSCULAR | Status: DC | PRN
  Administered 2021-07-18: .5 mg via INTRAVENOUS

## 2021-07-18 MED ORDER — LIDOCAINE HCL (PF) 1 % IJ SOLN
INTRAMUSCULAR | Status: AC
  Filled 2021-07-18: qty 30

## 2021-07-18 MED ORDER — METOPROLOL TARTRATE 5 MG/5ML IV SOLN
5.0000 mg | Freq: Once | INTRAVENOUS | Status: AC
  Administered 2021-07-18: 5 mg via INTRAVENOUS
  Filled 2021-07-18: qty 5

## 2021-07-18 MED ORDER — HYDRALAZINE HCL 20 MG/ML IJ SOLN: 10.0000 mg | INTRAMUSCULAR | Status: AC | PRN

## 2021-07-18 MED ORDER — ONDANSETRON HCL 4 MG/2ML IJ SOLN: 4.0000 mg | Freq: Four times a day (QID) | INTRAMUSCULAR | Status: DC | PRN

## 2021-07-18 MED ORDER — DILTIAZEM HCL-DEXTROSE 125-5 MG/125ML-% IV SOLN (PREMIX)
2.5000 mg/h | INTRAVENOUS | Status: DC
  Administered 2021-07-18 – 2021-07-19 (×2): 5 mg/h via INTRAVENOUS
  Filled 2021-07-18: qty 125

## 2021-07-18 MED ORDER — LABETALOL HCL 5 MG/ML IV SOLN: 10.0000 mg | INTRAVENOUS | Status: AC | PRN

## 2021-07-18 SURGICAL SUPPLY — 13 items
CATH INFINITI 5 FR IM (CATHETERS) ×1 IMPLANT
CATH INFINITI 5FR MPB2 (CATHETERS) ×1 IMPLANT
ELECT DEFIB PAD ADLT CADENCE (PAD) ×1 IMPLANT
GLIDESHEATH SLEND A-KIT 6F 22G (SHEATH) ×1 IMPLANT
GUIDEWIRE INQWIRE 1.5J.035X260 (WIRE) IMPLANT
INQWIRE 1.5J .035X260CM (WIRE) ×2
KIT HEART LEFT (KITS) ×2 IMPLANT
PACK CARDIAC CATHETERIZATION (CUSTOM PROCEDURE TRAY) ×2 IMPLANT
SHEATH PINNACLE 5F 10CM (SHEATH) ×1 IMPLANT
SHEATH PROBE COVER 6X72 (BAG) ×1 IMPLANT
TRANSDUCER W/STOPCOCK (MISCELLANEOUS) ×2 IMPLANT
TUBING CIL FLEX 10 FLL-RA (TUBING) ×2 IMPLANT
WIRE EMERALD 3MM-J .035X150CM (WIRE) ×1 IMPLANT

## 2021-07-18 NOTE — Progress Notes (Addendum)
Progress Note  Patient Name: Tara Potts Date of Encounter: 07/18/2021  Primary Cardiologist: Quay Burow, MD  Subjective   Became abruptly tachy in the 140s overnight, received adenosine without change this AM, and then IV diltiazem bolus and drip with improved HR. Had associated CP with tachycardia, now resolved. No hx afib/flutter.  Inpatient Medications    Scheduled Meds:  aspirin EC  81 mg Oral Daily   atorvastatin  80 mg Oral Daily   Chlorhexidine Gluconate Cloth  6 each Topical Daily   ezetimibe  10 mg Oral Daily   furosemide  40 mg Oral Daily   hydrALAZINE  50 mg Oral BID   losartan  100 mg Oral Daily   metoprolol tartrate  25 mg Oral BID   potassium chloride  10 mEq Oral Daily   sodium chloride flush  3 mL Intravenous Q12H   Continuous Infusions:  sodium chloride 20 mL/hr at 07/16/21 1033   sodium chloride 75 mL/hr at 07/18/21 0300   sodium chloride     sodium chloride     Followed by   sodium chloride     diltiazem (CARDIZEM) infusion 5 mg/hr (07/18/21 0922)   heparin 900 Units/hr (07/18/21 0922)   nitroGLYCERIN 5 mcg/min (07/18/21 0922)   PRN Meds: sodium chloride, acetaminophen **OR** acetaminophen, hydrocortisone cream, ondansetron (ZOFRAN) IV, sodium chloride flush   Vital Signs    Vitals:   07/18/21 0745 07/18/21 0800 07/18/21 0900 07/18/21 0917  BP: 122/77 139/69 (!) 182/62 (!) 117/59  Pulse: (!) 142 (!) 102 94 (!) 108  Resp: (!) 21 (!) 5 12   Temp: 98.1 F (36.7 C)     TempSrc: Oral     SpO2: 98% 95% 99%   Weight:      Height:        Intake/Output Summary (Last 24 hours) at 07/18/2021 0938 Last data filed at 07/18/2021 3086 Gross per 24 hour  Intake 2315.22 ml  Output 1300 ml  Net 1015.22 ml   Last 3 Weights 07/16/2021 07/16/2021 10/03/2020  Weight (lbs) 185 lb 13.6 oz 198 lb 199 lb 1.6 oz  Weight (kg) 84.3 kg 89.812 kg 90.311 kg     Telemetry    See notes below - Personally Reviewed  ECG    Yesterday: SVT/possible atrial flutter  with 2:1 conduction with ST depression/TWI inferiorly V3-V6  F/u tracing HR 94bpm with unusual P wave axis, query NSR with PACs versus atrial flutter with variable block, STTW changes inferiorly and V3-V6   - Personally Reviewed  Physical Exam   GEN: No acute distress.  HEENT: Normocephalic, atraumatic, sclera non-icteric. Neck: No JVD or bruits. Cardiac: Slightly irregular rhythm no murmurs, rubs, or gallops.  Respiratory: Mildly diminished throughout bilaterally without wheezing, rales or rhonchi. Breathing is unlabored. GI: Soft, nontender, non-distended, BS +x 4. MS: no deformity. Extremities: No clubbing or cyanosis. Mild sockline edema. Distal pedal pulses are 2+ and equal bilaterally. Neuro:  AAOx3. Follows commands. Psych:  Responds to questions appropriately with a normal affect.  Labs    High Sensitivity Troponin:   Recent Labs  Lab 07/16/21 1947 07/17/21 0058 07/17/21 0239 07/17/21 0520 07/17/21 2156  TROPONINIHS 1,398* 1,363* 1,341* 1,194* 519*      Cardiac EnzymesNo results for input(s): TROPONINI in the last 168 hours. No results for input(s): TROPIPOC in the last 168 hours.   Chemistry Recent Labs  Lab 07/16/21 2209 07/17/21 0858 07/18/21 0105  NA 137 134* 131*  K 3.9 3.8 3.7  CL  106 101 104  CO2 24 22 20*  GLUCOSE 105* 115* 127*  BUN 37* 34* 32*  CREATININE 1.55* 1.51* 1.61*  CALCIUM 8.6* 8.8* 7.8*  PROT 8.4* 8.4* 7.5  ALBUMIN 3.1* 3.2* 2.9*  AST 34 34 28  ALT '22 22 19  ' ALKPHOS 67 66 54  BILITOT 0.6 0.8 0.7  GFRNONAA 33* 34* 31*  ANIONGAP '7 11 7     ' Hematology Recent Labs  Lab 07/16/21 1018 07/17/21 0857 07/18/21 0105  WBC 7.5 8.1 8.1  RBC 4.05 3.41* 3.10*  HGB 11.0* 9.2* 8.5*  HCT 34.2* 28.2* 25.8*  MCV 84.4 82.7 83.2  MCH 27.2 27.0 27.4  MCHC 32.2 32.6 32.9  RDW 18.4* 18.3* 18.3*  PLT 220 216 193    BNPNo results for input(s): BNP, PROBNP in the last 168 hours.   DDimer No results for input(s): DDIMER in the last 168  hours.   Radiology    DG Chest Port 1 View  Result Date: 07/16/2021 CLINICAL DATA:  Central chest pain beginning around 7 a.m. this morning. EXAM: PORTABLE CHEST 1 VIEW COMPARISON:  06/21/2020. FINDINGS: Stable changes from a prior median sternotomy. Cardiac silhouette is normal in size. No mediastinal or hilar masses. Lungs are hyperexpanded, but clear. No convincing pleural effusion or pneumothorax. Skeletal structures are grossly intact. IMPRESSION: No acute cardiopulmonary disease. Electronically Signed   By: Lajean Manes M.D.   On: 07/16/2021 11:27   ECHOCARDIOGRAM COMPLETE  Result Date: 07/16/2021    ECHOCARDIOGRAM REPORT   Patient Name:   Tara Potts Date of Exam: 07/16/2021 Medical Rec #:  [id number]     Height:       67.0 in Accession #:    [id number]    Weight:       198.0 lb Date of Birth:  11-Mar-1934     BSA:          2.014 m Patient Age:    42 years      BP:           217/70 mmHg Patient Gender: F             HR:           72 bpm. Exam Location:  Inpatient Procedure: 2D Echo, Color Doppler and Cardiac Doppler Indications:    R07.9* Chest pain, unspecified; R94.31 Abnormal EKG  History:        Patient has prior history of Echocardiogram examinations, most                 recent 12/10/2019. Prior CABG; Risk Factors:Hypertension and                 Dyslipidemia.  Sonographer:    Raquel Sarna Senior RDCS Referring Phys: [id number] [place]  1. Left ventricular ejection fraction, by estimation, is 55 to 60%. The left ventricle has normal function. The left ventricle has no regional wall motion abnormalities. Left ventricular diastolic parameters are consistent with Grade II diastolic dysfunction (pseudonormalization). Elevated left ventricular end-diastolic pressure.  2. Right ventricular systolic function is normal. The right ventricular size is normal. There is mildly elevated pulmonary artery systolic pressure.  3. Left atrial size was moderately dilated.  4. The mitral valve is normal  in structure. Trivial mitral valve regurgitation. No evidence of mitral stenosis.  5. The aortic valve is calcified. There is mild calcification of the aortic valve. There is mild thickening of the aortic valve. Aortic valve regurgitation is not visualized. No aortic  stenosis is present.  6. The inferior vena cava is dilated in size with >50% respiratory variability, suggesting right atrial pressure of 8 mmHg. FINDINGS  Left Ventricle: Left ventricular ejection fraction, by estimation, is 55 to 60%. The left ventricle has normal function. The left ventricle has no regional wall motion abnormalities. The left ventricular internal cavity size was normal in size. There is  no left ventricular hypertrophy. Left ventricular diastolic parameters are consistent with Grade II diastolic dysfunction (pseudonormalization). Elevated left ventricular end-diastolic pressure. Right Ventricle: The right ventricular size is normal. No increase in right ventricular wall thickness. Right ventricular systolic function is normal. There is mildly elevated pulmonary artery systolic pressure. The tricuspid regurgitant velocity is 2.96  m/s, and with an assumed right atrial pressure of 8 mmHg, the estimated right ventricular systolic pressure is 78.2 mmHg. Left Atrium: Left atrial size was moderately dilated. Right Atrium: Right atrial size was normal in size. Pericardium: There is no evidence of pericardial effusion. Mitral Valve: The mitral valve is normal in structure. Trivial mitral valve regurgitation. No evidence of mitral valve stenosis. MV peak gradient, 11.0 mmHg. The mean mitral valve gradient is 4.0 mmHg. Tricuspid Valve: The tricuspid valve is normal in structure. Tricuspid valve regurgitation is mild . No evidence of tricuspid stenosis. Aortic Valve: The aortic valve is calcified. There is mild calcification of the aortic valve. There is mild thickening of the aortic valve. Aortic valve regurgitation is not visualized. No  aortic stenosis is present. Pulmonic Valve: The pulmonic valve was normal in structure. Pulmonic valve regurgitation is not visualized. No evidence of pulmonic stenosis. Aorta: The aortic root is normal in size and structure. Venous: The inferior vena cava is dilated in size with greater than 50% respiratory variability, suggesting right atrial pressure of 8 mmHg. IAS/Shunts: No atrial level shunt detected by color flow Doppler.  LEFT VENTRICLE PLAX 2D LVIDd:         4.90 cm   Diastology LVIDs:         2.70 cm   LV e' medial:    3.81 cm/s LV PW:         0.90 cm   LV E/e' medial:  36.0 LV IVS:        0.80 cm   LV e' lateral:   7.72 cm/s LVOT diam:     1.90 cm   LV E/e' lateral: 17.7 LV SV:         63 LV SV Index:   31 LVOT Area:     2.84 cm  RIGHT VENTRICLE RV S prime:     7.18 cm/s TAPSE (M-mode): 1.7 cm LEFT ATRIUM           Index        RIGHT ATRIUM           Index LA diam:      4.50 cm 2.23 cm/m   RA Area:     17.80 cm LA Vol (A2C): 74.3 ml 36.90 ml/m  RA Volume:   45.40 ml  22.55 ml/m LA Vol (A4C): 41.4 ml 20.56 ml/m  AORTIC VALVE LVOT Vmax:   103.00 cm/s LVOT Vmean:  68.600 cm/s LVOT VTI:    0.221 m  AORTA Ao Root diam: 2.60 cm MITRAL VALVE                TRICUSPID VALVE MV Area (PHT): 3.95 cm     TR Peak grad:   35.0 mmHg MV Area VTI:   1.76 cm  TR Vmax:        296.00 cm/s MV Peak grad:  11.0 mmHg MV Mean grad:  4.0 mmHg     SHUNTS MV Vmax:       1.66 m/s     Systemic VTI:  0.22 m MV Vmean:      97.8 cm/s    Systemic Diam: 1.90 cm MV Decel Time: 192 msec MV E velocity: 137.00 cm/s MV A velocity: 118.00 cm/s MV E/A ratio:  1.16 Skeet Latch MD Electronically signed by Skeet Latch MD Signature Date/Time: 07/16/2021/3:06:47 PM    Final     Cardiac Studies   2d echo 07/16/21  1. Left ventricular ejection fraction, by estimation, is 55 to 60%. The  left ventricle has normal function. The left ventricle has no regional  wall motion abnormalities. Left ventricular diastolic parameters are   consistent with Grade II diastolic  dysfunction (pseudonormalization). Elevated left ventricular end-diastolic  pressure.   2. Right ventricular systolic function is normal. The right ventricular  size is normal. There is mildly elevated pulmonary artery systolic  pressure.   3. Left atrial size was moderately dilated.   4. The mitral valve is normal in structure. Trivial mitral valve  regurgitation. No evidence of mitral stenosis.   5. The aortic valve is calcified. There is mild calcification of the  aortic valve. There is mild thickening of the aortic valve. Aortic valve  regurgitation is not visualized. No aortic stenosis is present.   6. The inferior vena cava is dilated in size with >50% respiratory  variability, suggesting right atrial pressure of 8 mmHg.   Patient Profile     88 y.o. female with CAD s/p CABG 2007 @ [place], HTN, HLD, carotid stenosis, CKD stage 3b, anemia, multiple myeloma, chronic LE edema on furosemide PTA presented with chest pain found to have NSTEMI and hypertensive emergency.  Assessment & Plan    1. Chest pain/NSTEMI with h/o CAD s/p CABG - troponin peak 1398 on 07/16/21 and initial EKS concenring for diffuse ischemia - last cath reported 2009 with diseased PDA graft and PDA managed medically - echo 07/16/21 EF 55-60%, grade 2 Dd, mildly elevated PASP - currently on medical therapy with ASA, heparin, metoprolol 46m BID, ezetimibe (see below re: statin), also on hydralazine 520mBID, NTG gtt, losartan 10058maily - plan cath today as outlined in 07/17/21 note  2. New onset SVT vs atrial flutter  - developed SVT yesterday associated with worsening chest pain - no significant response to adenosine (no pauses at time of administration), placed on IV diltiazem with improved HR to 80s - EKG yesterday showed SVT/possible atrial flutter with 2:1 conduction with ST depression/TWI inferiorly V3-V6 - F/u tracing this morning shows HR 94bpm with unusual P wave axis,  query NSR with PACs versus atrial flutter with variable block, STTW changes inferiorly and V3-V6 through improved from prior - will review tracings and EKG with MD - already on anticoag with IV heparin - check thyroid with AM labs   3. Hyperlipidemia - last LDL 07/20/20 109  - last cardiology OV 08/2020 indicated on atorvastatin + Zetia and pt did not wish to pursue PCSK9i at that time - however, since that [place] now [place] and no longer on statin given intolerance - Repatha given in house 07/17/21 - check lipid profile in AM  4. Hypertensive emergency - initial BP 218/95 on admission with evidence of cardiac ischemia - improved but remains labile (110 to 150s  systolic) - managed in context above  5. Sinus bradycardia - had SB in the 50s with higher doses of metoprolol, dose lowered, need to be cognizant in management above  6. CKD stage 3b - Cr appears near baseline of 1.7 - hold Lasix pre-cath, can decide resumption post-cath (getting hydration) - also on potassium but will give today given K 3.7 - need to continue to review KCl plan if she remains off Lasix - follow  7. Chronic anemia - baseline Hgb appears 8-11 - follow closely given slight downtrend - no bleeding reported  8. Hyponatremia - per primary team   For questions or updates, please contact [place] Please consult [email or web address] for contact info under Cardiology/STEMI.  Signed, Charlie Pitter, PA-C 07/18/2021, 9:38 AM   As above, patient seen and examined.  She denies chest pain this morning and there is no dyspnea.  She has ruled in for a non-ST elevation myocardial infarction and plan per Dr. Harl Bowie is cardiac catheterization.  The risks and benefits were discussed including myocardial infarction, CVA and death and she agrees to proceed.  I also discussed the risk of worsening renal insufficiency/contrast nephropathy given baseline renal insufficiency.  She is being hydrated and Lasix is  being held.  We will need to follow renal function closely after procedure.  Note LV function is normal on echocardiogram.  Continue aspirin, heparin, beta-blocker.  She is not on a statin but instead is on Repatha.  She did have arrhythmia overnight.  Possible atrial flutter. We will continue beta-blocker and Cardizem.  Follow heart rate.  Will need apixaban once all procedures complete.  Kirk Ruths, MD

## 2021-07-18 NOTE — CV Procedure (Addendum)
Difficult procedure because of absence of data concerning previous bypass grafts including number of grafts placed.  This is particularly a problem when one has renal insufficiency and contrast limitations are imposed. Uncertain if all data concerning bypass grafts obtained. FINDINGS: LIMA to mid to distal LAD is patent; graft to PDA is patent; manmade Y from the hood of the RCA SVG is patent but has high-grade obstruction in this course to the diagonal. Native right coronary contains severe mid vessel disease but with competitive flow noted in the PDA from patent graft. Left main 50-70 % distal ending ending saccular aneurysm before continuation in to high-grade greater than 80% obstruction in the ostial to proximal LAD.  Distal competitive flow was noted in the LAD from patent LIMA graft. Ostial to proximal high-grade circumflex disease with ostium of the circumflex communicating with the fusiform aneurysm noted above.  The first obtuse marginal contains diffuse 50 to 70% proximal to mid disease. Overall normal LV function with LVEDP 12 mmHg

## 2021-07-18 NOTE — Progress Notes (Signed)
ANTICOAGULATION CONSULT NOTE -   Pharmacy Consult for Heparin Indication: chest pain/ACS  Allergies  Allergen Reactions   Codeine Nausea And Vomiting    confusion   Cymbalta [Duloxetine Hcl] Nausea Only   Ezetimibe     Other reaction(s): cramping   Gabapentin     Other reaction(s): felt crazy    Patient Measurements: Height: '5\' 5"'  (165.1 cm) Weight: 84.3 kg (185 lb 13.6 oz) IBW/kg (Calculated) : 57 Heparin Dosing Weight: 75 kg  Vital Signs: Temp: 98.5 F (36.9 C) (01/02 1600) Temp Source: Oral (01/02 1600) BP: 138/62 (01/03 0125) Pulse Rate: 140 (01/03 0125)  Labs: Recent Labs    07/16/21 1018 07/16/21 1947 07/16/21 2209 07/17/21 0058 07/17/21 0239 07/17/21 0520 07/17/21 0857 07/17/21 0858 07/17/21 1639 07/17/21 2156 07/18/21 0105  HGB 11.0*  --   --   --   --   --  9.2*  --   --   --  8.5*  HCT 34.2*  --   --   --   --   --  28.2*  --   --   --  25.8*  PLT 220  --   --   --   --   --  216  --   --   --  193  HEPARINUNFRC  --   --   --   --   --   --   --   --  0.65  --  0.66  CREATININE 1.52*  --  1.55*  --   --   --   --  1.51*  --   --  1.61*  TROPONINIHS 13   < >  --    < > 1,341* 1,194*  --   --   --  519*  --    < > = values in this interval not displayed.     Estimated Creatinine Clearance: 27.4 mL/min (A) (by C-G formula based on SCr of 1.61 mg/dL (H)).   Medical History: Past Medical History:  Diagnosis Date   Anemia    Arthritis    Asthma    as a child   CAD (coronary artery disease)    Carotid disease, bilateral ([place])    mild   Fibroid    Hx of CABG 2007   [facility]   Hyperlipemia    Hypertension    Lumbosacral radiculopathy 09/10/2014   Menopausal symptoms    Multiple myeloma ([facility]) 05/26/2018   Peripheral neuropathy    Seizure-like activity ([facility])     Medications:  Scheduled:   aspirin EC  81 mg Oral Daily   atorvastatin  80 mg Oral Daily   Chlorhexidine Gluconate Cloth  6 each Topical Daily   ezetimibe  10 mg Oral  Daily   furosemide  40 mg Oral Daily   hydrALAZINE  50 mg Oral BID   losartan  100 mg Oral Daily   metoprolol tartrate  5 mg Intravenous Once   metoprolol tartrate  25 mg Oral BID   potassium chloride  10 mEq Oral Daily   sodium chloride flush  3 mL Intravenous Q12H   Infusions:   sodium chloride 20 mL/hr at 07/16/21 1033   sodium chloride 75 mL/hr at 07/18/21 0002   heparin 900 Units/hr (07/18/21 0000)   nitroGLYCERIN 35 mcg/min (07/18/21 0000)    Assessment: 82 yoF admitted on 1/1 with chest pain.  PMH includes CAD, HTN, HLD, carotid stenosis, CKD3, CABG 2007, Cath 2009.  She was started  on NTG drip in ED for HTN emergency.  Today, Pharmacy consulted to begin Heparin IV.   No Prior to admission anticoag; received Lovenox 40 mg SQ on 1/1 at ~18:00.  CBC: Hgb decreased to 9.2 (previously 11) SCr remains elevated/stable at 1.51 Troponin: 13, 1398, 1363, 1341, 1194  07/18/2021: Heparin level 0.66- remains therapeutic on heparin infusing at 900 units/hr No bleeding or heparin infusion line issues per nurse CBC: Hg low at 8.5, pltc WNL  Goal of Therapy:  Heparin level 0.3-0.7 units/ml Monitor platelets by anticoagulation protocol: Yes   Plan:  Continue heparin IV infusion at 900 units/hr Monitor daily heparin level, CBC, signs/symptoms of bleeding  Netta Cedars, PharmD, BCPS 07/18/2021 1:36 AM

## 2021-07-18 NOTE — Progress Notes (Signed)
° ° °  OVERNIGHT PROGRESS REPORT   Notified by RN for continued heart rate in the 130s, asymptomatic in spite of additional doses of IV Metoprolol. Patient has maintained BP on Nitroglycerin drip.  Appears in no obvious or stated distress since resolution of chest pain on nitroglycerin drip. Adenosine attempted x 1 dose with a 5 second decrease in rate to 70bpm +/- ( appeared to be sinus) EKG available and continuous Rhythm strip on chart.  Patient started on Cardizem drip at this time.   Gershon Cull MSNA MSN ACNPC-AG Acute Care Nurse Practitioner [place]

## 2021-07-18 NOTE — Progress Notes (Signed)
ANTICOAGULATION CONSULT NOTE - Follow Up Consult  Pharmacy Consult for Heparin Indication: chest pain/ACS  Allergies  Allergen Reactions   Codeine Nausea And Vomiting    confusion   Cymbalta [Duloxetine Hcl] Nausea Only   Ezetimibe     Other reaction(s): cramping   Gabapentin     Other reaction(s): felt crazy    Patient Measurements: Height: 5\' 5"  (165.1 cm) Weight: 84.3 kg (185 lb 13.6 oz) IBW/kg (Calculated) : 57 Heparin Dosing Weight: 75 kg  Vital Signs: Temp: 98 F (36.7 C) (01/03 0300) Temp Source: Oral (01/03 0230) BP: 106/86 (01/03 0630) Pulse Rate: 96 (01/03 0630)  Labs: Recent Labs    07/16/21 1018 07/16/21 1947 07/16/21 2209 07/17/21 0058 07/17/21 0239 07/17/21 0520 07/17/21 0857 07/17/21 0858 07/17/21 1639 07/17/21 2156 07/18/21 0105  HGB 11.0*  --   --   --   --   --  9.2*  --   --   --  8.5*  HCT 34.2*  --   --   --   --   --  28.2*  --   --   --  25.8*  PLT 220  --   --   --   --   --  216  --   --   --  193  HEPARINUNFRC  --   --   --   --   --   --   --   --  0.65  --  0.66  CREATININE 1.52*  --  1.55*  --   --   --   --  1.51*  --   --  1.61*  TROPONINIHS 13   < >  --    < > 1,341* 1,194*  --   --   --  519*  --    < > = values in this interval not displayed.    Estimated Creatinine Clearance: 27.4 mL/min (A) (by C-G formula based on SCr of 1.61 mg/dL (H)).   Medications:  Infusions:   sodium chloride 20 mL/hr at 07/16/21 1033   sodium chloride 75 mL/hr at 07/18/21 0300   sodium chloride     [START ON 07/19/2021] sodium chloride     Followed by   Derrill Memo ON 07/19/2021] sodium chloride     diltiazem (CARDIZEM) infusion 5 mg/hr (07/18/21 0629)   heparin 900 Units/hr (07/18/21 0629)   nitroGLYCERIN 15 mcg/min (07/18/21 0629)    Assessment: 29 yoF admitted on 1/1 with chest pain.  PMH includes CAD, HTN, HLD, carotid stenosis, CKD3, CABG 2007, Cath 2009.  She was started on NTG drip in ED for HTN emergency.  Today, Pharmacy consulted to  begin Heparin IV.   No Prior to admission anticoag; received Lovenox 40 mg SQ on 1/1 at ~18:00. Troponin: 13, 1398, 1363, 1341, 1194, 519  Today, 07/18/2021: Heparin level 0.91 is increased to supratherapeutic on heparin 900 units/hr - Heparin infusing in left peripheral, labs drawn from right arm per Phlebotomist. CBC:Hgb decreased to 8.5 (previously 11, 9.2).  Plt remain WNL.  No bleeding or complications reported.  SCr up to 1.6  Goal of Therapy:  Heparin level 0.3-0.7 units/ml Monitor platelets by anticoagulation protocol: Yes   Plan:  Decrease to heparin IV infusion at 750 units/hr Heparin level 8 hours after starting Daily heparin level and CBC Follow up plans for cardiac cath 1/3.  Per Dr. Stanford Breed, continue heparin infusion until Cath lab.     Gretta Arab PharmD, BCPS Clinical Pharmacist [facility] main  pharmacy [phone] 07/18/2021 7:18 AM

## 2021-07-18 NOTE — Progress Notes (Signed)
Site area: rt groin Site Prior to Removal:  Level 0 Pressure Applied For:  20 minutes Manual:   yes Patient Status During Pull:  stable Post Pull Site:  Level 0 Post Pull Instructions Given:  yes Post Pull Pulses Present: rt pt dopplered Dressing Applied:  gauze and tegaderm Bedrest begins @ 1800 Comments:

## 2021-07-18 NOTE — Progress Notes (Addendum)
PROGRESS NOTE  Tara Potts ASN:[id number] DOB: 01-15-1934 DOA: 07/16/2021 PCP: [name], MD   LOS: 1 day   Brief narrative:   Tara Potts is a 88 y.o. female with past medical history of anemia,  asthma, CAD, hyperlipidemia, hypertension, CKD stage III, multiple myeloma presented to hospital with chest pain without dyspnea or cough fever or shortness of breath.  In the ED, patient was mildly tachycardic.  Patient was given aspirin and nitroglycerin infusion for chest pain and elevated blood pressure at 2018/95.  Initial labs showed normal CBC with normal first troponin, creatinine 1.52 mg/dL.  Chest x-ray showed no evidence of congestion.  Patient was then admitted to the hospital for further evaluation and treatment.  Principal Problem:   NSTEMI (non-ST elevated myocardial infarction) ([place]) Active Problems:   Dyslipidemia, goal LDL below 70   Class 1 obesity   CRI (chronic renal insufficiency), stage 3 (moderate) (H[facility])   Hypertensive emergency   Normocytic anemia    Non-ST elevation MI. Chest pain with with elevated troponins.  History of CAD CABG in 2007.  Cardiology on board.  Troponin peak at 1398.  EKG showed diffuse ST segment depression.  Continue aspirin Zetia, losartan, nitro drip, Metoprolol, on heparin drip.  Plan is to proceed with cardiac catheterization today.    Hypertensive emergency Initial blood pressure of 218/95 on nitroglycerin drip,  continue beta-blockers losartan, hydralazine.  Blood pressure is better at this time.     Dyslipidemia, goal LDL below 70. Continue ezetimibe 10 mg p.o. daily.  Off atorvastatin.  Recently been started on Repatha.   CKd stage III. Monitor renal function electrolytes.  Received IV fluids for cardiac catheterization.    Normocytic anemia Continue to monitor closely.  Mild hyponatremia.  We will closely monitor, check BMP in AM.  DVT prophylaxis:   Heparin drip  Code Status: Full code  Family Communication:  I spoke  with the patient 's daughter Ms. [name] at bedside.  Status is: Inpatient  The patient is  inpatient because: Need for cardiac catheterization.    Consultants: Cardiology  Procedures: None so far  Anti-infectives:  None  Anti-infectives (From admission, onward)    None      Subjective: Today, patient was seen and examined at bedside.  Denies any chest pain or shortness of breath at this time.  Patient's daughter at bedside.  Awaiting for cardiac catheterization. Objective: Vitals:   07/18/21 1110 07/18/21 1115  BP:  103/80  Pulse:  70  Resp:  (!) 28  Temp: 98.2 F (36.8 C)   SpO2:  100%    Intake/Output Summary (Last 24 hours) at 07/18/2021 1353 Last data filed at 07/18/2021 1104 Gross per 24 hour  Intake 2258.02 ml  Output 1100 ml  Net 1158.02 ml    Filed Weights   07/16/21 1001 07/16/21 1850  Weight: 89.8 kg 84.3 kg   Body mass index is 30.93 kg/m.   Physical Exam:  General: Obese built, not in obvious distress HENT:   No scleral pallor or icterus noted. Oral mucosa is moist.  Chest:  Clear breath sounds.  Diminished breath sounds bilaterally. No crackles or wheezes.  CVS: S1 &S2 heard. No murmur.  Regular rate and rhythm. Abdomen: Soft, nontender, nondistended.  Bowel sounds are heard.   Extremities: No cyanosis, clubbing but with mild peripheral edema.  Peripheral pulses are palpable. Psych: Alert, awake and oriented, normal mood CNS:  No cranial nerve deficits.  Power equal in all extremities.   Skin:  Warm and dry.  No rashes noted.   Data Review: I have personally reviewed the following laboratory data and studies,  CBC: Recent Labs  Lab 07/16/21 1018 07/17/21 0857 07/18/21 0105  WBC 7.5 8.1 8.1  HGB 11.0* 9.2* 8.5*  HCT 34.2* 28.2* 25.8*  MCV 84.4 82.7 83.2  PLT 220 216 194    Basic Metabolic Panel: Recent Labs  Lab 07/16/21 1018 07/16/21 2209 07/17/21 0858 07/18/21 0105  NA 134* 137 134* 131*  K 4.9 3.9 3.8 3.7  CL 102 106 101  104  CO2 '22 24 22 ' 20*  GLUCOSE 105* 105* 115* 127*  BUN 34* 37* 34* 32*  CREATININE 1.52* 1.55* 1.51* 1.61*  CALCIUM 9.1 8.6* 8.8* 7.8*  MG  --  2.0  --  1.9    Liver Function Tests: Recent Labs  Lab 07/16/21 2209 07/17/21 0858 07/18/21 0105  AST 34 34 28  ALT '22 22 19  ' ALKPHOS 67 66 54  BILITOT 0.6 0.8 0.7  PROT 8.4* 8.4* 7.5  ALBUMIN 3.1* 3.2* 2.9*    No results for input(s): LIPASE, AMYLASE in the last 168 hours. No results for input(s): AMMONIA in the last 168 hours. Cardiac Enzymes: No results for input(s): CKTOTAL, CKMB, CKMBINDEX, TROPONINI in the last 168 hours. BNP (last 3 results) No results for input(s): BNP in the last 8760 hours.  ProBNP (last 3 results) No results for input(s): PROBNP in the last 8760 hours.  CBG: No results for input(s): GLUCAP in the last 168 hours. Recent Results (from the past 240 hour(s))  Resp Panel by RT-PCR (Flu A&B, Covid) Nasopharyngeal Swab     Status: None   Collection Time: 07/16/21 10:18 AM   Specimen: Nasopharyngeal Swab; Nasopharyngeal(NP) swabs in vial transport medium  Result Value Ref Range Status   SARS Coronavirus 2 by RT PCR NEGATIVE NEGATIVE Final    Comment: (NOTE) SARS-CoV-2 target nucleic acids are NOT DETECTED.  The SARS-CoV-2 RNA is generally detectable in upper respiratory specimens during the acute phase of infection. The lowest concentration of SARS-CoV-2 viral copies this assay can detect is 138 copies/mL. A negative result does not preclude SARS-Cov-2 infection and should not be used as the sole basis for treatment or other patient management decisions. A negative result may occur with  improper specimen collection/handling, submission of specimen other than nasopharyngeal swab, presence of viral mutation(s) within the areas targeted by this assay, and inadequate number of viral copies(<138 copies/mL). A negative result must be combined with clinical observations, patient history, and  epidemiological information. The expected result is Negative.  Fact Sheet for Patients:  EntrepreneurPulse.com.au  Fact Sheet for Healthcare Providers:  IncredibleEmployment.be  This test is no t yet approved or cleared by the [place] FDA and  has been authorized for detection and/or diagnosis of SARS-CoV-2 by FDA under an Emergency Use Authorization (EUA). This EUA will remain  in effect (meaning this test can be used) for the duration of the COVID-19 declaration under Section 564(b)(1) of the Act, 21 U.S.C.section 360bbb-3(b)(1), unless the authorization is terminated  or revoked sooner.       Influenza A by PCR NEGATIVE NEGATIVE Final   Influenza B by PCR NEGATIVE NEGATIVE Final    Comment: (NOTE) The Xpert Xpress SARS-CoV-2/FLU/RSV plus assay is intended as an aid in the diagnosis of influenza from Nasopharyngeal swab specimens and should not be used as a sole basis for treatment. Nasal washings and aspirates are unacceptable for Xpert Xpress SARS-CoV-2/FLU/RSV testing.  Fact Sheet  for Patients: EntrepreneurPulse.com.au  Fact Sheet for Healthcare Providers: IncredibleEmployment.be  This test is not yet approved or cleared by the [place] FDA and has been authorized for detection and/or diagnosis of SARS-CoV-2 by FDA under an Emergency Use Authorization (EUA). This EUA will remain in effect (meaning this test can be used) for the duration of the COVID-19 declaration under Section 564(b)(1) of the Act, 21 U.S.C. section 360bbb-3(b)(1), unless the authorization is terminated or revoked.  Performed at [facility], [place] [address] [place], [place] [address]   MRSA Next Gen by PCR, Nasal     Status: None   Collection Time: 07/16/21  7:00 PM   Specimen: Nasal Mucosa; Nasal Swab  Result Value Ref Range Status   MRSA by PCR Next Gen NOT DETECTED NOT DETECTED Final     Comment: (NOTE) The GeneXpert MRSA Assay (FDA approved for NASAL specimens only), is one component of a comprehensive MRSA colonization surveillance program. It is not intended to diagnose MRSA infection nor to guide or monitor treatment for MRSA infections. Test performance is not FDA approved in patients less than 50 years old. Performed at [facility], [place] [address] [place], [place] [address]       Studies: ECHOCARDIOGRAM COMPLETE  Result Date: 07/16/2021    ECHOCARDIOGRAM REPORT   Patient Name:   Tara Potts Date of Exam: 07/16/2021 Medical Rec #:  [id number]     Height:       67.0 in Accession #:    [id number]    Weight:       198.0 lb Date of Birth:  June 22, 1934     BSA:          2.014 m Patient Age:    74 years      BP:           217/70 mmHg Patient Gender: F             HR:           72 bpm. Exam Location:  Inpatient Procedure: 2D Echo, Color Doppler and Cardiac Doppler Indications:    R07.9* Chest pain, unspecified; R94.31 Abnormal EKG  History:        Patient has prior history of Echocardiogram examinations, most                 recent 12/10/2019. Prior CABG; Risk Factors:Hypertension and                 Dyslipidemia.  Sonographer:    Raquel Sarna Senior RDCS Referring Phys: [id number] [place]  1. Left ventricular ejection fraction, by estimation, is 55 to 60%. The left ventricle has normal function. The left ventricle has no regional wall motion abnormalities. Left ventricular diastolic parameters are consistent with Grade II diastolic dysfunction (pseudonormalization). Elevated left ventricular end-diastolic pressure.  2. Right ventricular systolic function is normal. The right ventricular size is normal. There is mildly elevated pulmonary artery systolic pressure.  3. Left atrial size was moderately dilated.  4. The mitral valve is normal in structure. Trivial mitral valve regurgitation. No evidence of mitral stenosis.  5. The aortic valve is calcified.  There is mild calcification of the aortic valve. There is mild thickening of the aortic valve. Aortic valve regurgitation is not visualized. No aortic stenosis is present.  6. The inferior vena cava is dilated in size with >50% respiratory variability, suggesting right atrial pressure of 8 mmHg. FINDINGS  Left Ventricle: Left ventricular ejection fraction,  by estimation, is 55 to 60%. The left ventricle has normal function. The left ventricle has no regional wall motion abnormalities. The left ventricular internal cavity size was normal in size. There is  no left ventricular hypertrophy. Left ventricular diastolic parameters are consistent with Grade II diastolic dysfunction (pseudonormalization). Elevated left ventricular end-diastolic pressure. Right Ventricle: The right ventricular size is normal. No increase in right ventricular wall thickness. Right ventricular systolic function is normal. There is mildly elevated pulmonary artery systolic pressure. The tricuspid regurgitant velocity is 2.96  m/s, and with an assumed right atrial pressure of 8 mmHg, the estimated right ventricular systolic pressure is 14.4 mmHg. Left Atrium: Left atrial size was moderately dilated. Right Atrium: Right atrial size was normal in size. Pericardium: There is no evidence of pericardial effusion. Mitral Valve: The mitral valve is normal in structure. Trivial mitral valve regurgitation. No evidence of mitral valve stenosis. MV peak gradient, 11.0 mmHg. The mean mitral valve gradient is 4.0 mmHg. Tricuspid Valve: The tricuspid valve is normal in structure. Tricuspid valve regurgitation is mild . No evidence of tricuspid stenosis. Aortic Valve: The aortic valve is calcified. There is mild calcification of the aortic valve. There is mild thickening of the aortic valve. Aortic valve regurgitation is not visualized. No aortic stenosis is present. Pulmonic Valve: The pulmonic valve was normal in structure. Pulmonic valve regurgitation is  not visualized. No evidence of pulmonic stenosis. Aorta: The aortic root is normal in size and structure. Venous: The inferior vena cava is dilated in size with greater than 50% respiratory variability, suggesting right atrial pressure of 8 mmHg. IAS/Shunts: No atrial level shunt detected by color flow Doppler.  LEFT VENTRICLE PLAX 2D LVIDd:         4.90 cm   Diastology LVIDs:         2.70 cm   LV e' medial:    3.81 cm/s LV PW:         0.90 cm   LV E/e' medial:  36.0 LV IVS:        0.80 cm   LV e' lateral:   7.72 cm/s LVOT diam:     1.90 cm   LV E/e' lateral: 17.7 LV SV:         63 LV SV Index:   31 LVOT Area:     2.84 cm  RIGHT VENTRICLE RV S prime:     7.18 cm/s TAPSE (M-mode): 1.7 cm LEFT ATRIUM           Index        RIGHT ATRIUM           Index LA diam:      4.50 cm 2.23 cm/m   RA Area:     17.80 cm LA Vol (A2C): 74.3 ml 36.90 ml/m  RA Volume:   45.40 ml  22.55 ml/m LA Vol (A4C): 41.4 ml 20.56 ml/m  AORTIC VALVE LVOT Vmax:   103.00 cm/s LVOT Vmean:  68.600 cm/s LVOT VTI:    0.221 m  AORTA Ao Root diam: 2.60 cm MITRAL VALVE                TRICUSPID VALVE MV Area (PHT): 3.95 cm     TR Peak grad:   35.0 mmHg MV Area VTI:   1.76 cm     TR Vmax:        296.00 cm/s MV Peak grad:  11.0 mmHg MV Mean grad:  4.0 mmHg     SHUNTS MV  Vmax:       1.66 m/s     Systemic VTI:  0.22 m MV Vmean:      97.8 cm/s    Systemic Diam: 1.90 cm MV Decel Time: 192 msec MV E velocity: 137.00 cm/s MV A velocity: 118.00 cm/s MV E/A ratio:  1.16 Skeet Latch MD Electronically signed by Skeet Latch MD Signature Date/Time: 07/16/2021/3:06:47 PM    Final       Flora Lipps, MD  [facility] 07/18/2021  If 7PM-7AM, please contact night-coverage

## 2021-07-19 ENCOUNTER — Inpatient Hospital Stay (HOSPITAL_COMMUNITY): Payer: Medicare HMO

## 2021-07-19 ENCOUNTER — Other Ambulatory Visit (HOSPITAL_COMMUNITY): Payer: Self-pay

## 2021-07-19 ENCOUNTER — Encounter (HOSPITAL_COMMUNITY): Payer: Self-pay | Admitting: Interventional Cardiology

## 2021-07-19 ENCOUNTER — Other Ambulatory Visit: Payer: Self-pay | Admitting: Physician Assistant

## 2021-07-19 DIAGNOSIS — E785 Hyperlipidemia, unspecified: Secondary | ICD-10-CM | POA: Diagnosis not present

## 2021-07-19 DIAGNOSIS — I471 Supraventricular tachycardia: Secondary | ICD-10-CM | POA: Diagnosis not present

## 2021-07-19 DIAGNOSIS — I214 Non-ST elevation (NSTEMI) myocardial infarction: Secondary | ICD-10-CM | POA: Diagnosis not present

## 2021-07-19 DIAGNOSIS — D649 Anemia, unspecified: Secondary | ICD-10-CM | POA: Diagnosis not present

## 2021-07-19 DIAGNOSIS — I2510 Atherosclerotic heart disease of native coronary artery without angina pectoris: Secondary | ICD-10-CM

## 2021-07-19 DIAGNOSIS — I724 Aneurysm of artery of lower extremity: Secondary | ICD-10-CM

## 2021-07-19 DIAGNOSIS — I10 Essential (primary) hypertension: Secondary | ICD-10-CM | POA: Diagnosis not present

## 2021-07-19 DIAGNOSIS — C9000 Multiple myeloma not having achieved remission: Secondary | ICD-10-CM

## 2021-07-19 LAB — BASIC METABOLIC PANEL
Anion gap: 7 (ref 5–15)
BUN: 26 mg/dL — ABNORMAL HIGH (ref 8–23)
CO2: 19 mmol/L — ABNORMAL LOW (ref 22–32)
Calcium: 8 mg/dL — ABNORMAL LOW (ref 8.9–10.3)
Chloride: 108 mmol/L (ref 98–111)
Creatinine, Ser: 1.79 mg/dL — ABNORMAL HIGH (ref 0.44–1.00)
GFR, Estimated: 27 mL/min — ABNORMAL LOW (ref >60)
Glucose, Bld: 102 mg/dL — ABNORMAL HIGH (ref 70–99)
Potassium: 3.9 mmol/L (ref 3.5–5.1)
Sodium: 134 mmol/L — ABNORMAL LOW (ref 135–145)

## 2021-07-19 LAB — CBC
HCT: 25 % — ABNORMAL LOW (ref 36.0–46.0)
Hemoglobin: 8.2 g/dL — ABNORMAL LOW (ref 12.0–15.0)
MCH: 27.1 pg (ref 26.0–34.0)
MCHC: 32.8 g/dL (ref 30.0–36.0)
MCV: 82.5 fL (ref 80.0–100.0)
Platelets: 190 10*3/uL (ref 150–400)
RBC: 3.03 MIL/uL — ABNORMAL LOW (ref 3.87–5.11)
RDW: 18.6 % — ABNORMAL HIGH (ref 11.5–15.5)
WBC: 5.8 10*3/uL (ref 4.0–10.5)
nRBC: 0 % (ref 0.0–0.2)

## 2021-07-19 LAB — MAGNESIUM: Magnesium: 1.7 mg/dL (ref 1.7–2.4)

## 2021-07-19 LAB — LIPID PANEL
Cholesterol: 175 mg/dL (ref 0–200)
HDL: 37 mg/dL — ABNORMAL LOW (ref >40)
LDL Cholesterol: 129 mg/dL — ABNORMAL HIGH (ref 0–99)
Total CHOL/HDL Ratio: 4.7 RATIO
Triglycerides: 43 mg/dL (ref <150)
VLDL: 9 mg/dL (ref 0–40)

## 2021-07-19 LAB — TSH: TSH: 2.142 u[IU]/mL (ref 0.350–4.500)

## 2021-07-19 MED ORDER — APIXABAN 2.5 MG PO TABS
2.5000 mg | ORAL_TABLET | Freq: Two times a day (BID) | ORAL | Status: DC
  Administered 2021-07-19 – 2021-07-20 (×3): 2.5 mg via ORAL
  Filled 2021-07-19 (×3): qty 1

## 2021-07-19 MED ORDER — ISOSORBIDE MONONITRATE ER 60 MG PO TB24
60.0000 mg | ORAL_TABLET | Freq: Every day | ORAL | Status: DC
  Administered 2021-07-19 – 2021-07-20 (×2): 60 mg via ORAL
  Filled 2021-07-19 (×2): qty 1

## 2021-07-19 MED ORDER — HYDRALAZINE HCL 50 MG PO TABS
50.0000 mg | ORAL_TABLET | Freq: Three times a day (TID) | ORAL | Status: DC
  Administered 2021-07-19 – 2021-07-20 (×3): 50 mg via ORAL
  Filled 2021-07-19 (×3): qty 1

## 2021-07-19 MED FILL — Verapamil HCl IV Soln 2.5 MG/ML: INTRAVENOUS | Qty: 2 | Status: AC

## 2021-07-19 NOTE — Progress Notes (Signed)
Occupational Therapy Evaluation Patient Details Name: Tara Potts MRN: [id number] DOB: 19-Jun-1934 Today's Date: 07/19/2021   History of Present Illness Pt is an 88 y.o. female admitted 1/1 with dx of NSTEMI. She underwent cardiac cath 1/3. PMH:  CAD s/p CABG, CKD stage IIIb, HLD, HTN, multiple myeloma   Clinical Impression   At baseline, pt lives with daughter who provides set up -min A for bathing and uses a cane intermittently around her home. Pt currently requiring supervision-min A for ADLs, supervision for bed mobility, and min guard for transfers. VSS throughout session, mild balance deficits observed while ambulating in room without AD. Pt presenting with impairments listed below, will continue to follow acutely. Recommend d/c home with assistance.      Recommendations for follow up therapy are one component of a multi-disciplinary discharge planning process, led by the attending physician.  Recommendations may be updated based on patient status, additional functional criteria and insurance authorization.   Follow Up Recommendations  No OT follow up    Assistance Recommended at Discharge Intermittent Supervision/Assistance  Patient can return home with the following A little help with walking and/or transfers;A little help with bathing/dressing/bathroom;Assistance with cooking/housework    Functional Status Assessment  Patient has had a recent decline in their functional status and demonstrates the ability to make significant improvements in function in a reasonable and predictable amount of time.  Equipment Recommendations  None recommended by OT;Other (comment) (pt has all needed DME)    Recommendations for Other Services PT consult     Precautions / Restrictions Precautions Precautions: Fall Restrictions Weight Bearing Restrictions: No      Mobility Bed Mobility Overal bed mobility: Needs Assistance Bed Mobility: Supine to Sit;Sit to Supine     Supine to sit:  Supervision;HOB elevated Sit to supine: Supervision;HOB elevated   General bed mobility comments: increased time needed    Transfers Overall transfer level: Needs assistance Equipment used: None Transfers: Sit to/from Stand Sit to Stand: Min guard           General transfer comment: increased time to power up and stabilize balance      Balance Overall balance assessment: Needs assistance Sitting-balance support: No upper extremity supported;Feet supported Sitting balance-Leahy Scale: Normal     Standing balance support: Bilateral upper extremity supported;Single extremity supported;During functional activity;Reliant on assistive device for balance Standing balance-Leahy Scale: Fair Standing balance comment: reliant on external assist                           ADL either performed or assessed with clinical judgement   ADL Overall ADL's : Needs assistance/impaired Eating/Feeding: Independent;Sitting   Grooming: Wash/dry hands;Independent;Standing Grooming Details (indicate cue type and reason): washes hands standing at sink Upper Body Bathing: Minimal assistance;Sitting   Lower Body Bathing: Sitting/lateral leans;Minimal assistance   Upper Body Dressing : Sitting;Supervision/safety   Lower Body Dressing: Supervision/safety;Sit to/from stand   Toilet Transfer: Min guard;Ambulation;Regular Glass blower/designer Details (indicate cue type and reason): uses counter top as support while walking Toileting- Water quality scientist and Hygiene: Supervision/safety;Sitting/lateral lean Toileting - Clothing Manipulation Details (indicate cue type and reason): performs pericare and clothing mgmt sitting on commode     Functional mobility during ADLs: Min guard       Vision   Vision Assessment?: No apparent visual deficits     Perception     Praxis      Pertinent Vitals/Pain Pain Assessment: No/denies pain  Hand Dominance     Extremity/Trunk  Assessment Upper Extremity Assessment Upper Extremity Assessment: Overall WFL for tasks assessed   Lower Extremity Assessment Lower Extremity Assessment: Defer to PT evaluation   Cervical / Trunk Assessment Cervical / Trunk Assessment: Kyphotic   Communication Communication Communication: No difficulties   Cognition Arousal/Alertness: Awake/alert Behavior During Therapy: WFL for tasks assessed/performed Overall Cognitive Status: No family/caregiver present to determine baseline cognitive functioning Area of Impairment: Attention;Memory;Following commands;Safety/judgement;Problem solving;Awareness                   Current Attention Level: Sustained Memory: Decreased short-term memory Following Commands: Follows one step commands with increased time Safety/Judgement: Decreased awareness of safety;Decreased awareness of deficits Awareness: Emergent Problem Solving: Slow processing;Decreased initiation;Requires verbal cues       General Comments  VSS on RA    Exercises     Shoulder Instructions      Home Living Family/patient expects to be discharged to:: Private residence Living Arrangements: Children (daughter) Available Help at Discharge: Family;Available 24 hours/day Type of Home: House Home Access: Stairs to enter [employer] of Steps: 2 Entrance Stairs-Rails: Right Home Layout: Two level Alternate Level Stairs-Number of Steps: elevator access   Bathroom Shower/Tub: Teacher, early years/pre: Standard     Home Equipment: Conservation officer, nature (2 wheels);Cane - single point;BSC/3in1   Additional Comments: daughter works from home, pt notes she sleeps on second floor      Prior Functioning/Environment Prior Level of Function : Needs assist             Mobility Comments: uses cane ADLs Comments: daughter provides set up A, helps was back as needed per pt. Reports doing some IADLs.        OT Problem List: Decreased  strength;Decreased range of motion;Decreased activity tolerance;Decreased safety awareness      OT Treatment/Interventions: Self-care/ADL training;Neuromuscular education;Energy conservation;DME and/or AE instruction;Therapeutic activities;Patient/family education;Balance training    OT Goals(Current goals can be found in the care plan section) Acute Rehab OT Goals Patient Stated Goal: to go home OT Goal Formulation: With patient Time For Goal Achievement: 08/02/21 Potential to Achieve Goals: Good ADL Goals Pt Will Perform Lower Body Dressing: with modified independence;sit to/from stand Pt Will Transfer to Toilet: with modified independence;ambulating;regular height toilet Pt Will Perform Tub/Shower Transfer: with modified independence;Tub transfer;3 in 1;ambulating;rolling walker  OT Frequency: Min 2X/week    Co-evaluation              AM-PAC OT "6 Clicks" Daily Activity     Outcome Measure Help from another person eating meals?: None Help from another person taking care of personal grooming?: None Help from another person toileting, which includes using toliet, bedpan, or urinal?: A Little Help from another person bathing (including washing, rinsing, drying)?: A Little Help from another person to put on and taking off regular upper body clothing?: A Little Help from another person to put on and taking off regular lower body clothing?: A Little 6 Click Score: 20   End of Session Equipment Utilized During Treatment: Gait belt Nurse Communication: Mobility status;Other (comment)  Activity Tolerance: Patient tolerated treatment well Patient left: in bed;with call bell/phone within reach;with bed alarm set  OT Visit Diagnosis: Unsteadiness on feet (R26.81);Other abnormalities of gait and mobility (R26.89);Muscle weakness (generalized) (M62.81)                Time: 1413-1440 OT Time Calculation (min): 27 min Charges:  OT General Charges $OT Visit: 1 Visit  OT Evaluation $OT  Eval Low Complexity: 1 Low OT Treatments $Self Care/Home Management : 8-22 mins  Lynnda Child, OTD, OTR/L Acute Rehab [phone]   Kaylyn Lim 07/19/2021, 2:49 PM

## 2021-07-19 NOTE — Progress Notes (Signed)
bmp 

## 2021-07-19 NOTE — Progress Notes (Signed)
PROGRESS NOTE        PATIENT DETAILS Name: Tara Potts Age: 88 y.o. Sex: female Date of Birth: Feb 09, 1934 Admit Date: 07/16/2021 Admitting Physician Flora Lipps, MD OPF:YTWKM, Thayer Jew, MD  Brief Narrative: Patient is a 88 y.o. female with history of CAD s/p CABG, CKD stage IIIb, HLD, HTN, multiple myeloma-presented with chest pain-found to have non-STEMI.  Subjective: Lying comfortably in bed-denies any chest pain or shortness of breath.  Objective: Vitals: Blood pressure (!) 174/70, pulse 74, temperature 98.5 F (36.9 C), temperature source Oral, resp. rate 16, height '5\' 5"'  (1.651 m), weight 84.3 kg, last menstrual period 07/16/2002, SpO2 100 %.   Exam: Gen Exam:Alert awake-not in any distress HEENT:atraumatic, normocephalic Chest: B/L clear to auscultation anteriorly CVS:S1S2 regular Abdomen:soft non tender, non distended Extremities:no edema Neurology: Non focal Skin: no rash  Pertinent Labs/Radiology: Recent Labs  Lab 07/16/21 2209 07/17/21 0857 07/17/21 0858 07/18/21 0105 07/18/21 2006 07/19/21 0254  WBC  --    < >  --  8.1   < > 5.8  HGB  --    < >  --  8.5*   < > 8.2*  PLT  --    < >  --  193   < > 190  NA 137  --  134* 131*  --  134*  K 3.9  --  3.8 3.7  --  3.9  CREATININE 1.55*  --  1.51* 1.61*   < > 1.79*  AST 34  --  34 28  --   --   ALT 22  --  22 19  --   --   ALKPHOS 67  --  66 54  --   --   BILITOT 0.6  --  0.8 0.7  --   --    < > = values in this interval not displayed.    Assessment/Plan: Non-STEMI: No further chest pain-S/p LHC on 1/3-recommendations are for medical management.  Cardiology checking right groin ultrasound to rule out pseudoaneurysm/AV fistula.  PAF: Back in sinus rhythm-continue metoprolol-cardiology starting Eliquis.  HLD: Continue statin-on Repatha as an outpatient.  HTN: BP stable-continue metoprolol, Imdur, losartan and reassess tomorrow.  CKD stage IIIb: Renal function close to  baseline.  Normocytic anemia: Due to CKD/underlying myeloma-hemoglobin not far from baseline.  Hyponatremia: Mild-asymptomatic.  History of multiple myeloma: Being observed by oncology.  Obesity: Estimated body mass index is 30.93 kg/m as calculated from the following:   Height as of this encounter: '5\' 5"'  (1.651 m).   Weight as of this encounter: 84.3 kg.   Procedures: None Consults: Cardiology DVT Prophylaxis: Eliquis Code Status:Full code Family Communication: None at bedside  Time spent: 25-minutes-Greater than 50% of this time was spent in counseling, explanation of diagnosis, planning of further management, and coordination of care.   Disposition Plan: Status is: Inpatient  Remains inpatient appropriate because: Uncontrolled hypertension-non-STEMI-awaiting right groin ultrasound-likely home on 1/5.  Needs to be ambulated with therapy services.    Diet: Diet Order             Diet Heart Room service appropriate? Yes; Fluid consistency: Thin  Diet effective now                     Antimicrobial agents: Anti-infectives (From admission, onward)    None  MEDICATIONS: Scheduled Meds:  apixaban  2.5 mg Oral BID   ezetimibe  10 mg Oral Daily   hydrALAZINE  50 mg Oral Q8H   isosorbide mononitrate  60 mg Oral Daily   losartan  100 mg Oral Daily   metoprolol tartrate  25 mg Oral BID   potassium chloride  10 mEq Oral Daily   sodium chloride flush  3 mL Intravenous Q12H   sodium chloride flush  3 mL Intravenous Q12H   Continuous Infusions:  sodium chloride 20 mL/hr at 07/16/21 1033   sodium chloride     PRN Meds:.sodium chloride, acetaminophen, hydrocortisone cream, ondansetron (ZOFRAN) IV, sodium chloride flush   I have personally reviewed following labs and imaging studies  LABORATORY DATA: CBC: Recent Labs  Lab 07/16/21 1018 07/17/21 0857 07/18/21 0105 07/18/21 2006 07/19/21 0254  WBC 7.5 8.1 8.1 5.7 5.8  HGB 11.0* 9.2* 8.5* 9.0*  8.2*  HCT 34.2* 28.2* 25.8* 28.4* 25.0*  MCV 84.4 82.7 83.2 83.8 82.5  PLT 220 216 193 212 962    Basic Metabolic Panel: Recent Labs  Lab 07/16/21 1018 07/16/21 2209 07/17/21 0858 07/18/21 0105 07/18/21 2006 07/19/21 0254  NA 134* 137 134* 131*  --  134*  K 4.9 3.9 3.8 3.7  --  3.9  CL 102 106 101 104  --  108  CO2 '22 24 22 ' 20*  --  19*  GLUCOSE 105* 105* 115* 127*  --  102*  BUN 34* 37* 34* 32*  --  26*  CREATININE 1.52* 1.55* 1.51* 1.61* 1.72* 1.79*  CALCIUM 9.1 8.6* 8.8* 7.8*  --  8.0*  MG  --  2.0  --  1.9  --  1.7    GFR: Estimated Creatinine Clearance: 24.6 mL/min (A) (by C-G formula based on SCr of 1.79 mg/dL (H)).  Liver Function Tests: Recent Labs  Lab 07/16/21 2209 07/17/21 0858 07/18/21 0105  AST 34 34 28  ALT '22 22 19  ' ALKPHOS 67 66 54  BILITOT 0.6 0.8 0.7  PROT 8.4* 8.4* 7.5  ALBUMIN 3.1* 3.2* 2.9*   No results for input(s): LIPASE, AMYLASE in the last 168 hours. No results for input(s): AMMONIA in the last 168 hours.  Coagulation Profile: No results for input(s): INR, PROTIME in the last 168 hours.  Cardiac Enzymes: No results for input(s): CKTOTAL, CKMB, CKMBINDEX, TROPONINI in the last 168 hours.  BNP (last 3 results) No results for input(s): PROBNP in the last 8760 hours.  Lipid Profile: Recent Labs    07/19/21 0254  CHOL 175  HDL 37*  LDLCALC 129*  TRIG 43  CHOLHDL 4.7    Thyroid Function Tests: Recent Labs    07/19/21 0254  TSH 2.142    Anemia Panel: No results for input(s): VITAMINB12, FOLATE, FERRITIN, TIBC, IRON, RETICCTPCT in the last 72 hours.  Urine analysis:    Component Value Date/Time   COLORURINE YELLOW 06/23/2020 1630   APPEARANCEUR HAZY (A) 06/23/2020 1630   LABSPEC 1.014 06/23/2020 1630   PHURINE 5.0 06/23/2020 1630   GLUCOSEU NEGATIVE 06/23/2020 1630   HGBUR NEGATIVE 06/23/2020 1630   BILIRUBINUR NEGATIVE 06/23/2020 1630   KETONESUR NEGATIVE 06/23/2020 1630   PROTEINUR NEGATIVE 06/23/2020 1630    UROBILINOGEN 0.2 03/03/2014 1146   NITRITE NEGATIVE 06/23/2020 1630   LEUKOCYTESUR MODERATE (A) 06/23/2020 1630    Sepsis Labs: Lactic Acid, Venous No results found for: LATICACIDVEN  MICROBIOLOGY: Recent Results (from the past 240 hour(s))  Resp Panel by RT-PCR (Flu A&B, Covid) Nasopharyngeal  Swab     Status: None   Collection Time: 07/16/21 10:18 AM   Specimen: Nasopharyngeal Swab; Nasopharyngeal(NP) swabs in vial transport medium  Result Value Ref Range Status   SARS Coronavirus 2 by RT PCR NEGATIVE NEGATIVE Final    Comment: (NOTE) SARS-CoV-2 target nucleic acids are NOT DETECTED.  The SARS-CoV-2 RNA is generally detectable in upper respiratory specimens during the acute phase of infection. The lowest concentration of SARS-CoV-2 viral copies this assay can detect is 138 copies/mL. A negative result does not preclude SARS-Cov-2 infection and should not be used as the sole basis for treatment or other patient management decisions. A negative result may occur with  improper specimen collection/handling, submission of specimen other than nasopharyngeal swab, presence of viral mutation(s) within the areas targeted by this assay, and inadequate number of viral copies(<138 copies/mL). A negative result must be combined with clinical observations, patient history, and epidemiological information. The expected result is Negative.  Fact Sheet for Patients:  EntrepreneurPulse.com.au  Fact Sheet for Healthcare Providers:  IncredibleEmployment.be  This test is no t yet approved or cleared by the [place] FDA and  has been authorized for detection and/or diagnosis of SARS-CoV-2 by FDA under an Emergency Use Authorization (EUA). This EUA will remain  in effect (meaning this test can be used) for the duration of the COVID-19 declaration under Section 564(b)(1) of the Act, 21 U.S.C.section 360bbb-3(b)(1), unless the authorization is terminated   or revoked sooner.       Influenza A by PCR NEGATIVE NEGATIVE Final   Influenza B by PCR NEGATIVE NEGATIVE Final    Comment: (NOTE) The Xpert Xpress SARS-CoV-2/FLU/RSV plus assay is intended as an aid in the diagnosis of influenza from Nasopharyngeal swab specimens and should not be used as a sole basis for treatment. Nasal washings and aspirates are unacceptable for Xpert Xpress SARS-CoV-2/FLU/RSV testing.  Fact Sheet for Patients: EntrepreneurPulse.com.au  Fact Sheet for Healthcare Providers: IncredibleEmployment.be  This test is not yet approved or cleared by the [place] FDA and has been authorized for detection and/or diagnosis of SARS-CoV-2 by FDA under an Emergency Use Authorization (EUA). This EUA will remain in effect (meaning this test can be used) for the duration of the COVID-19 declaration under Section 564(b)(1) of the Act, 21 U.S.C. section 360bbb-3(b)(1), unless the authorization is terminated or revoked.  Performed at [facility], [place] [address], [place], [place] [address]   MRSA Next Gen by PCR, Nasal     Status: None   Collection Time: 07/16/21  7:00 PM   Specimen: Nasal Mucosa; Nasal Swab  Result Value Ref Range Status   MRSA by PCR Next Gen NOT DETECTED NOT DETECTED Final    Comment: (NOTE) The GeneXpert MRSA Assay (FDA approved for NASAL specimens only), is one component of a comprehensive MRSA colonization surveillance program. It is not intended to diagnose MRSA infection nor to guide or monitor treatment for MRSA infections. Test performance is not FDA approved in patients less than 32 years old. Performed at [facility], [place] [address] [place], [place] [address]     RADIOLOGY STUDIES/RESULTS: CARDIAC CATHETERIZATION  Result Date: 07/18/2021 CONCLUSIONS: Difficult case with contrast constraints and no information on grafts performed at the time of surgery in  2007. Patent saphenous vein graft to distal RCA Patent but highly diseased graft to diagonal which arises from the hood of the RCA graft.  Treatable by PCI due to size and angulation from hood of RCA graft. Patent  LIMA to LAD Distal left main aneurysm.  50 to 70% distal left main stenosis. 90% ostial to proximal circumflex stenosis.  First obtuse marginal contains 70% stenosis in the proximal segment. RCA contains high-grade mid stenosis.  Competitive flow was noted distally from RCA graft. Normal LV function and EDP. RECOMMENDATIONS: Medical therapy Obtain a surgical report from [facility] for future reference and clear delineation of bypass grafts that were placed.  It is still unclear if the circumflex system was grafted.  Angiography suggests that it was not.  VAS [place] GROIN PSEUDOANEURYSM  Result Date: 07/19/2021  ARTERIAL PSEUDOANEURYSM  Patient Name:  Tara Potts  Date of Exam:   07/19/2021 Medical Rec #: [id number]      Accession #:    [id number] Date of Birth: 1934/06/06      Patient Gender: F Patient Age:   68 years Exam Location:  [facility] Procedure:      VAS [place] GROIN PSEUDOANEURYSM Referring Phys: Kirk Ruths --------------------------------------------------------------------------------  Exam: Right groin Indications: Patient complains of palpable knot. History: S/p catheterization. Comparison Study: No prior study Performing Technologist: Maudry Mayhew MHA, RDMS, RVT, RDCS  Examination Guidelines: A complete evaluation includes B-mode imaging, spectral Doppler, color Doppler, and power Doppler as needed of all accessible portions of each vessel. Bilateral testing is considered an integral part of a complete examination. Limited examinations for reoccurring indications may be performed as noted. +------------+----------+--------+------+----------+  Right Duplex PSV (cm/s) Waveform Plaque Comment(s)  +------------+----------+--------+------+----------+  CFA             392      biphasic                    +------------+----------+--------+------+----------+  PFA             280     biphasic                    +------------+----------+--------+------+----------+  Prox SFA         96     biphasic                    +------------+----------+--------+------+----------+ Right Vein comments:CFV patent  Summary: No evidence of pseudoaneurysm, AVF or DVT     --------------------------------------------------------------------------------    Preliminary      LOS: 2 days   Oren Binet, MD  [facility]    To contact the attending provider between 7A-7P or the covering provider during after hours 7P-7A, please log into the web site [email or web address] and access using universal [place] password for that web site. If you do not have the password, please call the hospital operator.  07/19/2021, 10:38 AM

## 2021-07-19 NOTE — Evaluation (Signed)
Physical Therapy Evaluation Patient Details Name: Tara Potts MRN: [id number] DOB: 1934-03-10 Today's Date: 07/19/2021  History of Present Illness  Pt is an 88 y.o. female admitted 1/1 with dx of NSTEMI. She underwent cardiac cath 1/3. PMH:  CAD s/p CABG, CKD stage IIIb, HLD, HTN, multiple myeloma   Clinical Impression  Pt admitted with above diagnosis. PTA pt lived at home with her daughter. Per pt, mod I mobility using cane.  Pt currently with functional limitations due to the deficits listed below (see PT Problem List). On eval, she required supervision bed mobility, min guard assist transfers, and min guard assist ambulation 150' with RW. VSS on RA. Pt will benefit from skilled PT to increase their independence and safety with mobility to allow discharge to the venue listed below.          Recommendations for follow up therapy are one component of a multi-disciplinary discharge planning process, led by the attending physician.  Recommendations may be updated based on patient status, additional functional criteria and insurance authorization.  Follow Up Recommendations Home health PT    Assistance Recommended at Discharge Frequent or constant Supervision/Assistance  Patient can return home with the following  A little help with walking and/or transfers;Assistance with cooking/housework;Direct supervision/assist for medications management;Assist for transportation;A little help with bathing/dressing/bathroom;Direct supervision/assist for financial management    Equipment Recommendations None recommended by PT  Recommendations for Other Services       Functional Status Assessment Patient has had a recent decline in their functional status and demonstrates the ability to make significant improvements in function in a reasonable and predictable amount of time.     Precautions / Restrictions Precautions Precautions: Fall Restrictions Weight Bearing Restrictions: No      Mobility   Bed Mobility Overal bed mobility: Needs Assistance Bed Mobility: Supine to Sit;Sit to Supine     Supine to sit: Supervision;HOB elevated Sit to supine: Supervision;HOB elevated   General bed mobility comments: +rail, increased time    Transfers Overall transfer level: Needs assistance Equipment used: Ambulation equipment used Transfers: Sit to/from Stand Sit to Stand: Min guard           General transfer comment: increased time to power up and stabilize balance    Ambulation/Gait Ambulation/Gait assistance: Min guard Gait Distance (Feet): 150 Feet Assistive device: Rolling walker (2 wheels) Gait Pattern/deviations: Step-through pattern;Decreased stride length Gait velocity: decreased Gait velocity interpretation: <1.31 ft/sec, indicative of household ambulator   General Gait Details: Initially attempted gait trial HHA on R to simulate cane. Pt unsteady requiring min assist, ambulating 25'. RW retrieved, which improved stability, and pt able to ambulate 150' min guard assist.  Stairs            Wheelchair Mobility    Modified Rankin (Stroke Patients Only)       Balance Overall balance assessment: Needs assistance Sitting-balance support: No upper extremity supported;Feet supported Sitting balance-Leahy Scale: Good     Standing balance support: Bilateral upper extremity supported;Single extremity supported;During functional activity;Reliant on assistive device for balance Standing balance-Leahy Scale: Poor Standing balance comment: reliant on external assist                             Pertinent Vitals/Pain Pain Assessment: No/denies pain    Home Living Family/patient expects to be discharged to:: Private residence Living Arrangements: Children (daughter) Available Help at Discharge: Family;Available 24 hours/day Type of Home: House Home Access: Level entry (  in garage)     Alternate Level Stairs-Number of Steps: elevator access Home  Layout: Multi-level Home Equipment: Rolling Walker (2 wheels);Cane - single point;BSC/3in1 Additional Comments: Home set up taken from previous admission. PLOF provided by pt. Unsure of pt's reliability as a historian.    Prior Function Prior Level of Function : Needs assist             Mobility Comments: Pt reports use of cane. ADLs Comments: daughter provides assist as needed     Hand Dominance        Extremity/Trunk Assessment   Upper Extremity Assessment Upper Extremity Assessment: Defer to OT evaluation    Lower Extremity Assessment Lower Extremity Assessment: Generalized weakness    Cervical / Trunk Assessment Cervical / Trunk Assessment: Kyphotic  Communication   Communication: No difficulties  Cognition Arousal/Alertness: Awake/alert Behavior During Therapy: WFL for tasks assessed/performed Overall Cognitive Status: No family/caregiver present to determine baseline cognitive functioning Area of Impairment: Attention;Memory;Following commands;Safety/judgement;Problem solving;Awareness                   Current Attention Level: Sustained Memory: Decreased short-term memory;Decreased recall of precautions Following Commands: Follows one step commands with increased time Safety/Judgement: Decreased awareness of safety;Decreased awareness of deficits Awareness: Emergent Problem Solving: Slow processing;Difficulty sequencing;Requires verbal cues          General Comments      Exercises     Assessment/Plan    PT Assessment Patient needs continued PT services  PT Problem List Decreased strength;Decreased mobility;Decreased safety awareness;Decreased knowledge of precautions;Decreased activity tolerance;Decreased cognition;Decreased balance;Decreased knowledge of use of DME       PT Treatment Interventions DME instruction;Therapeutic activities;Cognitive remediation;Gait training;Therapeutic exercise;Patient/family education;Balance  training;Functional mobility training    PT Goals (Current goals can be found in the Care Plan section)  Acute Rehab PT Goals Patient Stated Goal: home PT Goal Formulation: With patient Time For Goal Achievement: 08/02/21 Potential to Achieve Goals: Good    Frequency Min 3X/week     Co-evaluation               AM-PAC PT "6 Clicks" Mobility  Outcome Measure Help needed turning from your back to your side while in a flat bed without using bedrails?: A Little Help needed moving from lying on your back to sitting on the side of a flat bed without using bedrails?: A Little Help needed moving to and from a bed to a chair (including a wheelchair)?: A Little Help needed standing up from a chair using your arms (e.g., wheelchair or bedside chair)?: A Little Help needed to walk in hospital room?: A Little Help needed climbing 3-5 steps with a railing? : A Lot 6 Click Score: 17    End of Session Equipment Utilized During Treatment: Gait belt Activity Tolerance: Patient tolerated treatment well Patient left: in bed;with call bell/phone within reach;with bed alarm set Nurse Communication: Mobility status PT Visit Diagnosis: Unsteadiness on feet (R26.81);Muscle weakness (generalized) (M62.81)    Time: 0488-8916 PT Time Calculation (min) (ACUTE ONLY): 16 min   Charges:   PT Evaluation $PT Eval Moderate Complexity: 1 Mod          Lorrin Goodell, PT  Office # [phone] Pager [phone]   Lorriane Shire 07/19/2021, 11:14 AM

## 2021-07-19 NOTE — Progress Notes (Signed)
Progress Note  Patient Name: CHRYSTINE FROGGE Date of Encounter: 07/19/2021  [facility] Cardiologist: Quay Burow, MD   Subjective   No CP or dyspnea  Inpatient Medications    Scheduled Meds:  aspirin EC  81 mg Oral Daily   Chlorhexidine Gluconate Cloth  6 each Topical Daily   enoxaparin (LOVENOX) injection  30 mg Subcutaneous Q24H   ezetimibe  10 mg Oral Daily   hydrALAZINE  50 mg Oral BID   losartan  100 mg Oral Daily   metoprolol tartrate  25 mg Oral BID   potassium chloride  10 mEq Oral Daily   sodium chloride flush  3 mL Intravenous Q12H   sodium chloride flush  3 mL Intravenous Q12H   Continuous Infusions:  sodium chloride 20 mL/hr at 07/16/21 1033   sodium chloride     diltiazem (CARDIZEM) infusion 5 mg/hr (07/19/21 0552)   nitroGLYCERIN Stopped (07/18/21 0922)   PRN Meds: sodium chloride, acetaminophen, hydrocortisone cream, ondansetron (ZOFRAN) IV, sodium chloride flush   Vital Signs    Vitals:   07/18/21 2015 07/18/21 2228 07/18/21 2300 07/19/21 0413  BP: (!) 154/50 (!) 152/53 (!) 170/74 (!) 158/54  Pulse: 66 73  65  Resp: '16 16  16  ' Temp: 98.2 F (36.8 C) 98.5 F (36.9 C)  98.3 F (36.8 C)  TempSrc: Oral Oral  Oral  SpO2: 98%   100%  Weight:      Height:        Intake/Output Summary (Last 24 hours) at 07/19/2021 0741 Last data filed at 07/19/2021 0552 Gross per 24 hour  Intake 1323.04 ml  Output --  Net 1323.04 ml   Last 3 Weights 07/16/2021 07/16/2021 10/03/2020  Weight (lbs) 185 lb 13.6 oz 198 lb 199 lb 1.6 oz  Weight (kg) 84.3 kg 89.812 kg 90.311 kg      Telemetry    Sinus - Personally Reviewed  Physical Exam   GEN: No acute distress.   Neck: No JVD Cardiac: RRR, no murmurs, rubs, or gallops.  Respiratory: Clear to auscultation bilaterally. GI: Soft, nontender, non-distended  MS: No edema; right groin with no hematoma; positive bruit Neuro:  Nonfocal  Psych: Normal affect   Labs    High Sensitivity Troponin:   Recent Labs   Lab 07/16/21 1947 07/17/21 0058 07/17/21 0239 07/17/21 0520 07/17/21 2156  TROPONINIHS 1,398* 1,363* 1,341* 1,194* 519*     Chemistry Recent Labs  Lab 07/16/21 2209 07/17/21 0858 07/18/21 0105 07/18/21 2006 07/19/21 0254  NA 137 134* 131*  --  134*  K 3.9 3.8 3.7  --  3.9  CL 106 101 104  --  108  CO2 24 22 20*  --  19*  GLUCOSE 105* 115* 127*  --  102*  BUN 37* 34* 32*  --  26*  CREATININE 1.55* 1.51* 1.61* 1.72* 1.79*  CALCIUM 8.6* 8.8* 7.8*  --  8.0*  MG 2.0  --  1.9  --  1.7  PROT 8.4* 8.4* 7.5  --   --   ALBUMIN 3.1* 3.2* 2.9*  --   --   AST 34 34 28  --   --   ALT '22 22 19  ' --   --   ALKPHOS 67 66 54  --   --   BILITOT 0.6 0.8 0.7  --   --   GFRNONAA 33* 34* 31* 29* 27*  ANIONGAP '7 11 7  ' --  7    Lipids  Recent Labs  Lab 07/19/21 0254  CHOL 175  TRIG 43  HDL 37*  LDLCALC 129*  CHOLHDL 4.7    Hematology Recent Labs  Lab 07/18/21 0105 07/18/21 2006 07/19/21 0254  WBC 8.1 5.7 5.8  RBC 3.10* 3.39* 3.03*  HGB 8.5* 9.0* 8.2*  HCT 25.8* 28.4* 25.0*  MCV 83.2 83.8 82.5  MCH 27.4 26.5 27.1  MCHC 32.9 31.7 32.8  RDW 18.3* 18.6* 18.6*  PLT 193 212 190   Thyroid  Recent Labs  Lab 07/19/21 0254  TSH 2.142     Radiology    CARDIAC CATHETERIZATION  Result Date: 07/18/2021 CONCLUSIONS: Difficult case with contrast constraints and no information on grafts performed at the time of surgery in 2007. Patent saphenous vein graft to distal RCA Patent but highly diseased graft to diagonal which arises from the hood of the RCA graft.  Treatable by PCI due to size and angulation from hood of RCA graft. Patent LIMA to LAD Distal left main aneurysm.  50 to 70% distal left main stenosis. 90% ostial to proximal circumflex stenosis.  First obtuse marginal contains 70% stenosis in the proximal segment. RCA contains high-grade mid stenosis.  Competitive flow was noted distally from RCA graft. Normal LV function and EDP. RECOMMENDATIONS: Medical therapy Obtain a surgical  report from [facility] for future reference and clear delineation of bypass grafts that were placed.  It is still unclear if the circumflex system was grafted.  Angiography suggests that it was not.    Patient Profile     88 y.o. female with CAD s/p CABG 2007 @ [place], HTN, HLD, carotid stenosis, CKD stage 3b, anemia, multiple myeloma, chronic LE edema on furosemide PTA presented with chest pain found to have NSTEMI and hypertensive emergency.  Echocardiogram this admission shows ejection fraction 55 to 93%, grade 2 diastolic dysfunction, moderate left atrial enlargement.  Cardiac catheterization revealed patent saphenous vein to the distal right coronary artery, patent but highly diseased graft to the diagonal, patent LIMA to the LAD, 90% proximal circumflex and 70% OM.  LV function was normal with normal left ventricular end-diastolic pressure.  Medical therapy recommended.  Assessment & Plan    1 coronary artery disease/non-ST elevation myocardial infarction-cardiac catheterization results noted.  Plan is medical therapy.  She is intolerant to statins.  I will discontinue aspirin as we are adding apixaban for atrial flutter.  Continue metoprolol.  2 atrial flutter-noted on telemetry previously.  Patient is in sinus rhythm today.  We will continue metoprolol for rate control if atrial flutter recurs.  Discontinue aspirin and treat with apixaban 2.5 mg twice daily (she is 88 years old with creatinine of 1.79).  3 hyperlipidemia-continue Zetia and resume Repatha at discharge.  4 hypertension-blood pressure remains elevated.  We will change hydralazine to 50 mg p.o. 3 times daily.  I am discontinuing IV nitroglycerin and will treat with isosorbide 60 mg daily.  Follow blood pressure and adjust medications as needed.  5 chronic stage IIIb kidney disease-renal function unchanged today.  Will resume Lasix 40 mg daily at discharge.  We will check potassium and renal function on January 6 as  she is at risk for contrast nephropathy.  6 right groin bruit-noted post catheterization.  We will arrange ultrasound to exclude pseudoaneurysm or AV fistula.  7 chronic anemia-likely secondary to myeloma and renal insufficiency.  Hemoglobin is stable today.  Plan right groin ultrasound as outlined above.  If no pseudoaneurysm or AV fistula noted patient can be discharged from a cardiac standpoint.  Check potassium and renal function on January 6 as outlined.  We will arrange follow-up with APP in approximately 4 weeks.  For questions or updates, please contact [place] Please consult [email or web address] for contact info under        Signed, Kirk Ruths, MD  07/19/2021, 7:41 AM

## 2021-07-19 NOTE — Discharge Instructions (Signed)

## 2021-07-19 NOTE — Progress Notes (Signed)
Limited right lower extremity arterial duplex completed. Refer to "CV Proc" under chart review to view preliminary results.  07/19/2021 9:52 AM Kelby Aline., MHA, RVT, RDCS, RDMS

## 2021-07-19 NOTE — TOC Benefit Eligibility Note (Signed)
Patient Teacher, English as a foreign language completed.    The patient is currently admitted and upon discharge could be taking Eliquis 5 mg.  The current 30 day co-pay is, $45.00.   The patient is currently admitted and upon discharge could be taking Xarelto 20 mg.  The current 30 day co-pay is, $45.00.   The patient is insured through [place], [place] Patient Advocate Specialist [place] Patient Advocate Team Direct Number: [phone]  Fax: [phone]

## 2021-07-19 NOTE — TOC Initial Note (Addendum)
Transition of Care [facility]) - Initial/Assessment Note    Patient Details  Name: Tara Potts MRN: [id number] Date of Birth: [birth year]  Transition of Care [facility]) CM/SW Contact:    Angelita Ingles, RN Phone Number:[phone]  07/19/2021, 11:49 AM  Clinical Narrative:                 [facility] consulted for patient with Home health needs. CM at bedside. Patient states that she is independent and lives with her daughter. Patients reported PCP is Dr. Concha Pyo. Patient states that she does have a pharmacy where she obtains her meds but is unable to recall name of pharmacy. CM explained Home health physical therapy to patient and provided patient with medicare.gov list. Patient is unable to make decision and states that it would be ok for CM to speak with her daughter [name] for decision on Home health agency. CM called daughter with no answer. Voicemail has been left. Will await return call. TOC will continue to follow.   1228 CM received return call from daughter [name]. Daughter wants [facility] for [facility]. CM called [place] referral to [facility] with [place]. Referral has been accepted.No other needs noted at this time. TOC will continue to follow.   1300 TOC will continue follow for [facility] PT orders  Expected Discharge Plan: [place] Barriers to Discharge: Continued Medical Work up   Patient Goals and CMS Choice Patient states their goals for this hospitalization and ongoing recovery are:: Want to be able to do what she wants to do CMS Medicare.gov Compare Post Acute Care list provided to:: Patient (attempted to call daughter [name] message has been left) Choice offered to / list presented to : Patient, Adult Children (call from daughter pending)  Expected Discharge Plan and Services Expected Discharge Plan: [place] In-house Referral: NA Discharge Planning Services: CM Consult   Living arrangements for the past 2 months: Single Family Home                 DME Arranged: N/A                     Prior Living Arrangements/Services Living arrangements for the past 2 months: Single Family Home Lives with:: Adult Children Patient language and need for interpreter reviewed:: Yes Do you feel safe going back to the place where you live?: Yes      Need for Family Participation in Patient Care: Yes (Comment) Care giver support system in place?: Yes (comment) Current home services:  (n/a) Criminal Activity/Legal Involvement Pertinent to Current Situation/Hospitalization: No - Comment as needed  Activities of Daily Living Home Assistive Devices/Equipment: Cane (specify quad or straight) (straight) ADL Screening (condition at time of admission) Patient's cognitive ability adequate to safely complete daily activities?: Yes Is the patient deaf or have difficulty hearing?: No Does the patient have difficulty seeing, even when wearing glasses/contacts?: No Does the patient have difficulty concentrating, remembering, or making decisions?: Yes Patient able to express need for assistance with ADLs?: Yes Does the patient have difficulty dressing or bathing?: No Independently performs ADLs?: Yes (appropriate for developmental age) Does the patient have difficulty walking or climbing stairs?: No Weakness of Legs: None Weakness of Arms/Hands: None  Permission Sought/Granted Permission sought to share information with : Family Supports Permission granted to share information with : Yes, Verbal Permission Granted  Share Information with NAME: [name]     Permission granted to share info w Relationship: daughter  Emotional Assessment Appearance:: Appears stated age Attitude/Demeanor/Rapport: Gracious Affect (typically observed): Quiet, Pleasant Orientation: : Oriented to Self, Oriented to Place, Oriented to Situation Alcohol / Substance Use: Not Applicable Psych Involvement: No (comment)  Admission diagnosis:  Unstable angina (HCC) [I20.0] NSTEMI (non-ST elevated  myocardial infarction) [facility]) [I21.4] Patient Active Problem List   Diagnosis Date Noted   NSTEMI (non-ST elevated myocardial infarction) ([place]) 07/17/2021   Hypertensive emergency 07/16/2021   Normocytic anemia 07/16/2021   Acute lower UTI 06/21/2020   Dehydration 06/21/2020   Acute renal failure superimposed on stage 3 chronic kidney disease ([place]) 06/21/2020   Hyponatremia 06/21/2020   Gait abnormality 04/14/2020   Peripheral neuropathy 04/14/2020   CRI (chronic renal insufficiency), stage 3 (moderate) ([place]) 11/20/2019   Bilateral lower extremity edema 10/23/2019   Multiple myeloma ([place]) 05/26/2018   Goals of care, counseling/discussion 05/26/2018   Class 1 obesity 01/29/2018   S/P right THA, AA 01/28/2018   S/P hip replacement 01/28/2018   Carotid artery disease ([place]) 04/12/2017   Passed out 10/05/2015   Lumbosacral radiculopathy 09/10/2014   Cervical stenosis of spinal canal 09/10/2014   Abnormality of gait 07/27/2014   Paresthesia 07/27/2014   Bilateral leg cramps 02/21/2014   Hx of CABG 01/13/2013   Peripheral vascular disease:  Moderate right and mild left ICA stenosis. 01/13/2013   Essential hypertension 01/13/2013   Dyslipidemia, goal LDL below 70    Menopause 02/12/2011   Leiomyoma of body of uterus 02/12/2011   Post-menopausal atrophic vaginitis 02/12/2011   Urinary frequency 02/12/2011   UNSPECIFIED PERIPHERAL VASCULAR DISEASE 07/11/2010   PCP:  [name], MD Pharmacy:   [id number] [place] [id number]: [phone]: [phone]AT SSpecial[facility]OF [place]Phone: [phone]: [phone]    Social Determinants of Health ([place] Interventions    Readmission Risk Interventions Readmission Risk Prevention Plan 07/19/2021  Transportation Screening Complete  PCP  or Specialist Appt within 5-7 Days Complete  Home Care Screening Complete  Some recent data might be hidden

## 2021-07-20 ENCOUNTER — Other Ambulatory Visit (HOSPITAL_COMMUNITY): Payer: Self-pay

## 2021-07-20 DIAGNOSIS — I214 Non-ST elevation (NSTEMI) myocardial infarction: Secondary | ICD-10-CM | POA: Diagnosis not present

## 2021-07-20 DIAGNOSIS — N183 Chronic kidney disease, stage 3 unspecified: Secondary | ICD-10-CM | POA: Diagnosis not present

## 2021-07-20 DIAGNOSIS — E785 Hyperlipidemia, unspecified: Secondary | ICD-10-CM | POA: Diagnosis not present

## 2021-07-20 DIAGNOSIS — Z951 Presence of aortocoronary bypass graft: Secondary | ICD-10-CM | POA: Diagnosis not present

## 2021-07-20 MED ORDER — APIXABAN 2.5 MG PO TABS
2.5000 mg | ORAL_TABLET | Freq: Two times a day (BID) | ORAL | 3 refills | Status: DC
  Filled 2021-07-20: qty 60, 30d supply, fill #0

## 2021-07-20 MED ORDER — METOPROLOL TARTRATE 25 MG PO TABS
25.0000 mg | ORAL_TABLET | Freq: Two times a day (BID) | ORAL | 3 refills | Status: DC
  Filled 2021-07-20: qty 60, 30d supply, fill #0

## 2021-07-20 MED ORDER — ISOSORBIDE MONONITRATE ER 60 MG PO TB24
60.0000 mg | ORAL_TABLET | Freq: Every day | ORAL | 3 refills | Status: DC
  Filled 2021-07-20: qty 30, 30d supply, fill #0

## 2021-07-20 MED ORDER — HYDRALAZINE HCL 25 MG PO TABS
50.0000 mg | ORAL_TABLET | Freq: Three times a day (TID) | ORAL | 3 refills | Status: DC
  Filled 2021-07-20: qty 90, 15d supply, fill #0

## 2021-07-20 NOTE — Discharge Summary (Addendum)
PATIENT DETAILS Name: Tara Potts Age: 88 y.o. Sex: female Date of Birth: 25-Jul-1933 MRN: [id number]. Admitting Physician: Flora Lipps, MD HFW:YOVZC, Thayer Jew, MD  Admit Date: 07/16/2021 Discharge date: 07/20/2021  Recommendations for Outpatient Follow-up:  Follow up with PCP in 1-2 weeks Please obtain CMP/CBC in one week Please ensure follow-up with cardiology  Admitted From:  Home  Disposition: Home with home health services   [place]: Yes  Equipment/Devices: None  Discharge Condition: Stable  CODE STATUS: FULL CODE  Diet recommendation:  Diet Order             Diet - low sodium heart healthy           Diet Heart Room service appropriate? Yes; Fluid consistency: Thin  Diet effective now                    Brief Summary: Patient is a 88 y.o. female with history of CAD s/p CABG, CKD stage IIIb, HLD, HTN, multiple myeloma-presented with chest pain-found to have non-STEMI.  Brief Hospital Course: Non-STEMI: No further chest pain-S/p LHC on 1/3-recommendations are for medical management  PAF: Back in sinus rhythm-continue metoprolol-cardiology starting Eliquis.   HLD: Continue Zetia-on Repatha as an outpatient.   HTN: BP stable-continue metoprolol, Imdur, losartan   CKD stage IIIb: Renal function close to baseline.   Normocytic anemia: Due to CKD/underlying myeloma-hemoglobin not far from baseline.   Hyponatremia: Mild-asymptomatic.   History of multiple myeloma: Being observed by oncology.   Obesity: Estimated body mass index is 30.93 kg/m as calculated from the following:   Height as of this encounter: '5\' 5"'  (1.651 m).   Weight as of this encounter: 84.3 kg.    Note: Voicemail left for daughter on day of discharge.  Procedures 1/3>> left heart catheterization:  CONCLUSIONS:  Difficult case with contrast constraints and no information on grafts performed at the time of surgery in 2007. Patent saphenous vein graft to distal  RCA Patent but highly diseased graft to diagonal which arises from the hood of the RCA graft.  Treatable by PCI due to size and angulation from hood of RCA graft. Patent LIMA to LAD Distal left main aneurysm.  50 to 70% distal left main stenosis. 90% ostial to proximal circumflex stenosis.  First obtuse marginal contains 70% stenosis in the proximal segment. RCA contains high-grade mid stenosis.  Competitive flow was noted distally from RCA graft. Normal LV function and EDP.   RECOMMENDATIONS:  Medical therapy Obtain a surgical report from [facility] for future reference and clear delineation of bypass grafts that were placed.  It is still unclear if the circumflex system was grafted.  Angiography suggests that it was not.  Discharge Diagnoses:  Principal Problem:   NSTEMI (non-ST elevated myocardial infarction) ([place]) Active Problems:   Dyslipidemia, goal LDL below 70   Hx of CABG   Class 1 obesity   Multiple myeloma (HCC)   CRI (chronic renal insufficiency), stage 3 (moderate) (HCC)   Hypertensive emergency   Normocytic anemia   Discharge Instructions:  Activity:  As tolerated   Discharge Instructions     (HEART FAILURE PATIENTS) Call MD:  Anytime you have any of the following symptoms: 1) 3 pound weight gain in 24 hours or 5 pounds in 1 week 2) shortness of breath, with or without a dry hacking cough 3) swelling in the hands, feet or stomach 4) if you have to sleep on extra pillows at night in order to breathe.  Complete by: As directed    Call MD for:  difficulty breathing, headache or visual disturbances   Complete by: As directed    Call MD for:  severe uncontrolled pain   Complete by: As directed    Diet - low sodium heart healthy   Complete by: As directed    Discharge instructions   Complete by: As directed    Follow with Primary MD  [name], MD in 1-2 weeks  Please get a complete blood count and chemistry panel checked by your Primary MD at your  next visit, and again as instructed by your Primary MD.  Get Medicines reviewed and adjusted: Please take all your medications with you for your next visit with your Primary MD  Laboratory/radiological data: Please request your Primary MD to go over all hospital tests and procedure/radiological results at the follow up, please ask your Primary MD to get all Hospital records sent to his/her office.  In some cases, they will be blood work, cultures and biopsy results pending at the time of your discharge. Please request that your primary care M.D. follows up on these results.  Also Note the following: If you experience worsening of your admission symptoms, develop shortness of breath, life threatening emergency, suicidal or homicidal thoughts you must seek medical attention immediately by calling 911 or calling your MD immediately  if symptoms less severe.  You must read complete instructions/literature along with all the possible adverse reactions/side effects for all the Medicines you take and that have been prescribed to you. Take any new Medicines after you have completely understood and accpet all the possible adverse reactions/side effects.   Do not drive when taking Pain medications or sleeping medications (Benzodaizepines)  Do not take more than prescribed Pain, Sleep and Anxiety Medications. It is not advisable to combine anxiety,sleep and pain medications without talking with your primary care practitioner  Special Instructions: If you have smoked or chewed Tobacco  in the last 2 yrs please stop smoking, stop any regular Alcohol  and or any Recreational drug use.  Wear Seat belts while driving.  Please note: You were cared for by a hospitalist during your hospital stay. Once you are discharged, your primary care physician will handle any further medical issues. Please note that NO REFILLS for any discharge medications will be authorized once you are discharged, as it is imperative  that you return to your primary care physician (or establish a relationship with a primary care physician if you do not have one) for your post hospital discharge needs so that they can reassess your need for medications and monitor your lab values.   Increase activity slowly   Complete by: As directed       Allergies as of 07/20/2021       Reactions   Codeine Nausea And Vomiting   confusion   Cymbalta [duloxetine Hcl] Nausea Only   Ezetimibe    Other reaction(s): cramping   Gabapentin    Other reaction(s): felt crazy        Medication List     STOP taking these medications    aspirin EC 81 MG tablet   furosemide 40 MG tablet Commonly known as: LASIX   GLUCOSAMINE PO       TAKE these medications    apixaban 2.5 MG Tabs tablet Commonly known as: ELIQUIS Take 1 tablet (2.5 mg total) by mouth 2 (two) times daily.   ezetimibe 10 MG tablet Commonly known as: ZETIA Take 10 mg  by mouth daily.   hydrALAZINE 25 MG tablet Commonly known as: APRESOLINE Take 2 tablets (50 mg total) by mouth 3 (three) times daily. What changed: See the new instructions.   isosorbide mononitrate 60 MG 24 hr tablet Commonly known as: IMDUR Take 1 tablet (60 mg total) by mouth daily.   losartan 100 MG tablet Commonly known as: COZAAR Take 100 mg by mouth daily.   metoprolol tartrate 25 MG tablet Commonly known as: LOPRESSOR Take 1 tablet (25 mg total) by mouth 2 (two) times daily. What changed:  how much to take Another medication with the same name was removed. Continue taking this medication, and follow the directions you see here.   potassium chloride 10 MEQ tablet Commonly known as: KLOR-CON TAKE 1 TABLET(10 MEQ) BY MOUTH DAILY What changed: See the new instructions.   Repatha SureClick 034 MG/ML Soaj Generic drug: Evolocumab Inject 1 mL into the skin every 30 (thirty) days.        Follow-up Information     [facility] Follow up in 1 day(s).   Specialty:  Cardiology Why: Present to the lab for blood draw. You do not need to be fasting. Contact information: [place], [facility] Follow up.   Specialty: Home Health Services Why: [place] to call with visit times Contact information: [address] [place] [address] [phone]                Allergies  Allergen Reactions   Codeine Nausea And Vomiting    confusion   Cymbalta [Duloxetine Hcl] Nausea Only   Ezetimibe     Other reaction(s): cramping   Gabapentin     Other reaction(s): felt crazy      Consultations:  cardiology   Other Procedures/Studies: CARDIAC CATHETERIZATION  Result Date: 07/18/2021 CONCLUSIONS: Difficult case with contrast constraints and no information on grafts performed at the time of surgery in 2007. Patent saphenous vein graft to distal RCA Patent but highly diseased graft to diagonal which arises from the hood of the RCA graft.  Treatable by PCI due to size and angulation from hood of RCA graft. Patent LIMA to LAD Distal left main aneurysm.  50 to 70% distal left main stenosis. 90% ostial to proximal circumflex stenosis.  First obtuse marginal contains 70% stenosis in the proximal segment. RCA contains high-grade mid stenosis.  Competitive flow was noted distally from RCA graft. Normal LV function and EDP. RECOMMENDATIONS: Medical therapy Obtain a surgical report from [facility] for future reference and clear delineation of bypass grafts that were placed.  It is still unclear if the circumflex system was grafted.  Angiography suggests that it was not.  DG Chest Port 1 View  Result Date: 07/16/2021 CLINICAL DATA:  Central chest pain beginning around 7 a.m. this morning. EXAM: PORTABLE CHEST 1 VIEW COMPARISON:  06/21/2020. FINDINGS: Stable changes from a prior median sternotomy. Cardiac silhouette is normal in size.  No mediastinal or hilar masses. Lungs are hyperexpanded, but clear. No convincing pleural effusion or pneumothorax. Skeletal structures are grossly intact. IMPRESSION: No acute cardiopulmonary disease. Electronically Signed   By: Lajean Manes M.D.   On: 07/16/2021 11:27   VAS [place] GROIN PSEUDOANEURYSM  Result Date: 07/19/2021  ARTERIAL PSEUDOANEURYSM  Patient Name:  Tara Potts  Date of Exam:   07/19/2021 Medical Rec #: [id number]      Accession #:  [id number] Date of Birth: Nov 19, 1933      Patient Gender: F Patient Age:   71 years Exam Location:  [facility] Procedure:      VAS [place] GROIN PSEUDOANEURYSM Referring Phys: Kirk Ruths --------------------------------------------------------------------------------  Exam: Right groin Indications: Patient complains of palpable knot. History: S/p catheterization. Comparison Study: No prior study Performing Technologist: Maudry Mayhew MHA, RDMS, RVT, RDCS  Examination Guidelines: A complete evaluation includes B-mode imaging, spectral Doppler, color Doppler, and power Doppler as needed of all accessible portions of each vessel. Bilateral testing is considered an integral part of a complete examination. Limited examinations for reoccurring indications may be performed as noted. +------------+----------+--------+------+----------+  Right Duplex PSV (cm/s) Waveform Plaque Comment(s)  +------------+----------+--------+------+----------+  CFA             392     biphasic                    +------------+----------+--------+------+----------+  PFA             280     biphasic                    +------------+----------+--------+------+----------+  Prox SFA         96     biphasic                    +------------+----------+--------+------+----------+ Right Vein comments:CFV patent  Summary: No evidence of pseudoaneurysm, AVF or DVT  Diagnosing physician: Harold Barban MD Electronically signed by Harold Barban MD on 07/19/2021 at 11:30:21 PM.     --------------------------------------------------------------------------------    Final    ECHOCARDIOGRAM COMPLETE  Result Date: 07/16/2021    ECHOCARDIOGRAM REPORT   Patient Name:   WHITTLEY CARANDANG Date of Exam: 07/16/2021 Medical Rec #:  [id number]     Height:       67.0 in Accession #:    [id number]    Weight:       198.0 lb Date of Birth:  Dec 03, 1933     BSA:          2.014 m Patient Age:    26 years      BP:           217/70 mmHg Patient Gender: F             HR:           72 bpm. Exam Location:  Inpatient Procedure: 2D Echo, Color Doppler and Cardiac Doppler Indications:    R07.9* Chest pain, unspecified; R94.31 Abnormal EKG  History:        Patient has prior history of Echocardiogram examinations, most                 recent 12/10/2019. Prior CABG; Risk Factors:Hypertension and                 Dyslipidemia.  Sonographer:    Raquel Sarna Senior RDCS Referring Phys: [id number] [place]  1. Left ventricular ejection fraction, by estimation, is 55 to 60%. The left ventricle has normal function. The left ventricle has no regional wall motion abnormalities. Left ventricular diastolic parameters are consistent with Grade II diastolic dysfunction (pseudonormalization). Elevated left ventricular end-diastolic pressure.  2. Right ventricular systolic function is normal. The right ventricular size is normal. There is mildly elevated pulmonary artery systolic pressure.  3. Left atrial size was moderately dilated.  4. The mitral valve is normal in structure. Trivial mitral valve regurgitation. No evidence  of mitral stenosis.  5. The aortic valve is calcified. There is mild calcification of the aortic valve. There is mild thickening of the aortic valve. Aortic valve regurgitation is not visualized. No aortic stenosis is present.  6. The inferior vena cava is dilated in size with >50% respiratory variability, suggesting right atrial pressure of 8 mmHg. FINDINGS  Left Ventricle: Left ventricular ejection fraction,  by estimation, is 55 to 60%. The left ventricle has normal function. The left ventricle has no regional wall motion abnormalities. The left ventricular internal cavity size was normal in size. There is  no left ventricular hypertrophy. Left ventricular diastolic parameters are consistent with Grade II diastolic dysfunction (pseudonormalization). Elevated left ventricular end-diastolic pressure. Right Ventricle: The right ventricular size is normal. No increase in right ventricular wall thickness. Right ventricular systolic function is normal. There is mildly elevated pulmonary artery systolic pressure. The tricuspid regurgitant velocity is 2.96  m/s, and with an assumed right atrial pressure of 8 mmHg, the estimated right ventricular systolic pressure is 17.7 mmHg. Left [facility]: Left atrial size was moderately dilated. Right [facility]: Right atrial size was normal in size. Pericardium: There is no evidence of pericardial effusion. Mitral Valve: The mitral valve is normal in structure. Trivial mitral valve regurgitation. No evidence of mitral valve stenosis. MV peak gradient, 11.0 mmHg. The mean mitral valve gradient is 4.0 mmHg. Tricuspid Valve: The tricuspid valve is normal in structure. Tricuspid valve regurgitation is mild . No evidence of tricuspid stenosis. Aortic Valve: The aortic valve is calcified. There is mild calcification of the aortic valve. There is mild thickening of the aortic valve. Aortic valve regurgitation is not visualized. No aortic stenosis is present. Pulmonic Valve: The pulmonic valve was normal in structure. Pulmonic valve regurgitation is not visualized. No evidence of pulmonic stenosis. Aorta: The aortic root is normal in size and structure. Venous: The inferior vena cava is dilated in size with greater than 50% respiratory variability, suggesting right atrial pressure of 8 mmHg. IAS/Shunts: No atrial level shunt detected by color flow Doppler.  LEFT VENTRICLE PLAX 2D LVIDd:         4.90 cm    Diastology LVIDs:         2.70 cm   LV e' medial:    3.81 cm/s LV PW:         0.90 cm   LV E/e' medial:  36.0 LV IVS:        0.80 cm   LV e' lateral:   7.72 cm/s LVOT diam:     1.90 cm   LV E/e' lateral: 17.7 LV SV:         63 LV SV Index:   31 LVOT Area:     2.84 cm  RIGHT VENTRICLE RV S prime:     7.18 cm/s TAPSE (M-mode): 1.7 cm LEFT ATRIUM           Index        RIGHT ATRIUM           Index LA diam:      4.50 cm 2.23 cm/m   RA Area:     17.80 cm LA Vol (A2C): 74.3 ml 36.90 ml/m  RA Volume:   45.40 ml  22.55 ml/m LA Vol (A4C): 41.4 ml 20.56 ml/m  AORTIC VALVE LVOT Vmax:   103.00 cm/s LVOT Vmean:  68.600 cm/s LVOT VTI:    0.221 m  AORTA Ao Root diam: 2.60 cm MITRAL VALVE  TRICUSPID VALVE MV Area (PHT): 3.95 cm     TR Peak grad:   35.0 mmHg MV Area VTI:   1.76 cm     TR Vmax:        296.00 cm/s MV Peak grad:  11.0 mmHg MV Mean grad:  4.0 mmHg     SHUNTS MV Vmax:       1.66 m/s     Systemic VTI:  0.22 m MV Vmean:      97.8 cm/s    Systemic Diam: 1.90 cm MV Decel Time: 192 msec MV E velocity: 137.00 cm/s MV A velocity: 118.00 cm/s MV E/A ratio:  1.16 Skeet Latch MD Electronically signed by Skeet Latch MD Signature Date/Time: 07/16/2021/3:06:47 PM    Final      TODAY-DAY OF DISCHARGE:  Subjective:   Louie [place] today has no headache,no chest abdominal pain,no new weakness tingling or numbness, feels much better wants to go home today.   Objective:   Blood pressure (!) 176/57, pulse 70, temperature 98.4 F (36.9 C), temperature source Oral, resp. rate 18, height '5\' 5"'  (1.651 m), weight 84.3 kg, last menstrual period 07/16/2002, SpO2 100 %.  Intake/Output Summary (Last 24 hours) at 07/20/2021 0949 Last data filed at 07/19/2021 1300 Gross per 24 hour  Intake 480 ml  Output --  Net 480 ml   Filed Weights   07/16/21 1001 07/16/21 1850  Weight: 89.8 kg 84.3 kg    Exam: Awake Alert, Oriented *3, No new F.N deficits, Normal affect [place].AT,PERRAL Supple Neck,No JVD, No  cervical lymphadenopathy appriciated.  Symmetrical Chest wall movement, Good air movement bilaterally, CTAB RRR,No Gallops,Rubs or new Murmurs, No Parasternal Heave +ve B.Sounds, Abd Soft, Non tender, No organomegaly appriciated, No rebound -guarding or rigidity. No Cyanosis, Clubbing or edema, No new Rash or bruise   PERTINENT RADIOLOGIC STUDIES: CARDIAC CATHETERIZATION  Result Date: 07/18/2021 CONCLUSIONS: Difficult case with contrast constraints and no information on grafts performed at the time of surgery in 2007. Patent saphenous vein graft to distal RCA Patent but highly diseased graft to diagonal which arises from the hood of the RCA graft.  Treatable by PCI due to size and angulation from hood of RCA graft. Patent LIMA to LAD Distal left main aneurysm.  50 to 70% distal left main stenosis. 90% ostial to proximal circumflex stenosis.  First obtuse marginal contains 70% stenosis in the proximal segment. RCA contains high-grade mid stenosis.  Competitive flow was noted distally from RCA graft. Normal LV function and EDP. RECOMMENDATIONS: Medical therapy Obtain a surgical report from [facility] for future reference and clear delineation of bypass grafts that were placed.  It is still unclear if the circumflex system was grafted.  Angiography suggests that it was not.  VAS [place] GROIN PSEUDOANEURYSM  Result Date: 07/19/2021  ARTERIAL PSEUDOANEURYSM  Patient Name:  RAIGEN JAGIELSKI  Date of Exam:   07/19/2021 Medical Rec #: [id number]      Accession #:    [id number] Date of Birth: 1934-01-24      Patient Gender: F Patient Age:   16 years Exam Location:  [facility] Procedure:      VAS [place] GROIN PSEUDOANEURYSM Referring Phys: Kirk Ruths --------------------------------------------------------------------------------  Exam: Right groin Indications: Patient complains of palpable knot. History: S/p catheterization. Comparison Study: No prior study Performing Technologist: Maudry Mayhew  MHA, RDMS, RVT, RDCS  Examination Guidelines: A complete evaluation includes B-mode imaging, spectral Doppler, color Doppler, and power Doppler as needed of all accessible portions of  each vessel. Bilateral testing is considered an integral part of a complete examination. Limited examinations for reoccurring indications may be performed as noted. +------------+----------+--------+------+----------+  Right Duplex PSV (cm/s) Waveform Plaque Comment(s)  +------------+----------+--------+------+----------+  CFA             392     biphasic                    +------------+----------+--------+------+----------+  PFA             280     biphasic                    +------------+----------+--------+------+----------+  Prox SFA         96     biphasic                    +------------+----------+--------+------+----------+ Right Vein comments:CFV patent  Summary: No evidence of pseudoaneurysm, AVF or DVT  Diagnosing physician: Harold Barban MD Electronically signed by Harold Barban MD on 07/19/2021 at 11:30:21 PM.    --------------------------------------------------------------------------------    Final      PERTINENT LAB RESULTS: CBC: Recent Labs    07/18/21 2006 07/19/21 0254  WBC 5.7 5.8  HGB 9.0* 8.2*  HCT 28.4* 25.0*  PLT 212 190   CMET CMP     Component Value Date/Time   NA 134 (L) 07/19/2021 0254   NA 137 08/19/2020 1046   NA 138 07/03/2017 0928   K 3.9 07/19/2021 0254   K 4.6 07/03/2017 0928   CL 108 07/19/2021 0254   CO2 19 (L) 07/19/2021 0254   CO2 29 07/03/2017 0928   GLUCOSE 102 (H) 07/19/2021 0254   GLUCOSE 96 07/03/2017 0928   BUN 26 (H) 07/19/2021 0254   BUN 24 08/19/2020 1046   BUN 25.5 07/03/2017 0928   CREATININE 1.79 (H) 07/19/2021 0254   CREATININE 1.98 (H) 07/11/2020 1500   CREATININE 1.5 (H) 07/03/2017 0928   CALCIUM 8.0 (L) 07/19/2021 0254   CALCIUM 9.4 07/03/2017 0928   PROT 7.5 07/18/2021 0105   PROT 8.1 05/21/2019 1206   PROT 7.8 07/03/2017 0928   ALBUMIN 2.9  (L) 07/18/2021 0105   ALBUMIN 4.2 05/21/2019 1206   ALBUMIN 3.7 07/03/2017 0928   AST 28 07/18/2021 0105   AST 41 07/11/2020 1500   AST 25 07/03/2017 0928   ALT 19 07/18/2021 0105   ALT 36 07/11/2020 1500   ALT 17 07/03/2017 0928   ALKPHOS 54 07/18/2021 0105   ALKPHOS 72 07/03/2017 0928   BILITOT 0.7 07/18/2021 0105   BILITOT 0.6 07/11/2020 1500   BILITOT 0.51 07/03/2017 0928   GFRNONAA 27 (L) 07/19/2021 0254   GFRNONAA 24 (L) 07/11/2020 1500   GFRAA 31 (L) 08/19/2020 1046   GFRAA 37 (L) 08/25/2018 1137    GFR Estimated Creatinine Clearance: 24.6 mL/min (A) (by C-G formula based on SCr of 1.79 mg/dL (H)). No results for input(s): LIPASE, AMYLASE in the last 72 hours. No results for input(s): CKTOTAL, CKMB, CKMBINDEX, TROPONINI in the last 72 hours. Invalid input(s): POCBNP No results for input(s): DDIMER in the last 72 hours. No results for input(s): HGBA1C in the last 72 hours. Recent Labs    07/19/21 0254  CHOL 175  HDL 37*  LDLCALC 129*  TRIG 43  CHOLHDL 4.7   Recent Labs    07/19/21 0254  TSH 2.142   No results for input(s): VITAMINB12, FOLATE, FERRITIN, TIBC, IRON, RETICCTPCT in the last 72 hours.  Coags: No results for input(s): INR in the last 72 hours.  Invalid input(s): PT Microbiology: Recent Results (from the past 240 hour(s))  Resp Panel by RT-PCR (Flu A&B, Covid) Nasopharyngeal Swab     Status: None   Collection Time: 07/16/21 10:18 AM   Specimen: Nasopharyngeal Swab; Nasopharyngeal(NP) swabs in vial transport medium  Result Value Ref Range Status   SARS Coronavirus 2 by RT PCR NEGATIVE NEGATIVE Final    Comment: (NOTE) SARS-CoV-2 target nucleic acids are NOT DETECTED.  The SARS-CoV-2 RNA is generally detectable in upper respiratory specimens during the acute phase of infection. The lowest concentration of SARS-CoV-2 viral copies this assay can detect is 138 copies/mL. A negative result does not preclude SARS-Cov-2 infection and should not be  used as the sole basis for treatment or other patient management decisions. A negative result may occur with  improper specimen collection/handling, submission of specimen other than nasopharyngeal swab, presence of viral mutation(s) within the areas targeted by this assay, and inadequate number of viral copies(<138 copies/mL). A negative result must be combined with clinical observations, patient history, and epidemiological information. The expected result is Negative.  Fact Sheet for Patients:  EntrepreneurPulse.com.au  Fact Sheet for Healthcare Providers:  IncredibleEmployment.be  This test is no t yet approved or cleared by the [place] FDA and  has been authorized for detection and/or diagnosis of SARS-CoV-2 by FDA under an Emergency Use Authorization (EUA). This EUA will remain  in effect (meaning this test can be used) for the duration of the COVID-19 declaration under Section 564(b)(1) of the Act, 21 U.S.C.section 360bbb-3(b)(1), unless the authorization is terminated  or revoked sooner.       Influenza A by PCR NEGATIVE NEGATIVE Final   Influenza B by PCR NEGATIVE NEGATIVE Final    Comment: (NOTE) The Xpert Xpress SARS-CoV-2/FLU/RSV plus assay is intended as an aid in the diagnosis of influenza from Nasopharyngeal swab specimens and should not be used as a sole basis for treatment. Nasal washings and aspirates are unacceptable for Xpert Xpress SARS-CoV-2/FLU/RSV testing.  Fact Sheet for Patients: EntrepreneurPulse.com.au  Fact Sheet for Healthcare Providers: IncredibleEmployment.be  This test is not yet approved or cleared by the [place] FDA and has been authorized for detection and/or diagnosis of SARS-CoV-2 by FDA under an Emergency Use Authorization (EUA). This EUA will remain in effect (meaning this test can be used) for the duration of the COVID-19 declaration under Section  564(b)(1) of the Act, 21 U.S.C. section 360bbb-3(b)(1), unless the authorization is terminated or revoked.  Performed at [facility] [place] [address] [place], [place] [address]   MRSA Next Gen by PCR, Nasal     Status: None   Collection Time: 07/16/21  7:00 PM   Specimen: Nasal Mucosa; Nasal Swab  Result Value Ref Range Status   MRSA by PCR Next Gen NOT DETECTED NOT DETECTED Final    Comment: (NOTE) The GeneXpert MRSA Assay (FDA approved for NASAL specimens only), is one component of a comprehensive MRSA colonization surveillance program. It is not intended to diagnose MRSA infection nor to guide or monitor treatment for MRSA infections. Test performance is not FDA approved in patients less than 47 years old. Performed at [facility], [place] [address] [place], [place] [address]     FURTHER DISCHARGE INSTRUCTIONS:  Get Medicines reviewed and adjusted: Please take all your medications with you for your next visit with your Primary MD  Laboratory/radiological data: Please request your Primary MD to  go over all hospital tests and procedure/radiological results at the follow up, please ask your Primary MD to get all Hospital records sent to his/her office.  In some cases, they will be blood work, cultures and biopsy results pending at the time of your discharge. Please request that your primary care M.D. goes through all the records of your hospital data and follows up on these results.  Also Note the following: If you experience worsening of your admission symptoms, develop shortness of breath, life threatening emergency, suicidal or homicidal thoughts you must seek medical attention immediately by calling 911 or calling your MD immediately  if symptoms less severe.  You must read complete instructions/literature along with all the possible adverse reactions/side effects for all the Medicines you take and that have been prescribed to you. Take  any new Medicines after you have completely understood and accpet all the possible adverse reactions/side effects.   Do not drive when taking Pain medications or sleeping medications (Benzodaizepines)  Do not take more than prescribed Pain, Sleep and Anxiety Medications. It is not advisable to combine anxiety,sleep and pain medications without talking with your primary care practitioner  Special Instructions: If you have smoked or chewed Tobacco  in the last 2 yrs please stop smoking, stop any regular Alcohol  and or any Recreational drug use.  Wear Seat belts while driving.  Please note: You were cared for by a hospitalist during your hospital stay. Once you are discharged, your primary care physician will handle any further medical issues. Please note that NO REFILLS for any discharge medications will be authorized once you are discharged, as it is imperative that you return to your primary care physician (or establish a relationship with a primary care physician if you do not have one) for your post hospital discharge needs so that they can reassess your need for medications and monitor your lab values.  Total Time spent coordinating discharge including counseling, education and face to face time equals 35 minutes.  SignedOren Binet 07/20/2021 9:49 AM

## 2021-07-20 NOTE — Plan of Care (Signed)
°  Problem: Education: °Goal: Knowledge of General Education information will improve °Description: Including pain rating scale, medication(s)/side effects and non-pharmacologic comfort measures °Outcome: Adequate for Discharge °  °Problem: Health Behavior/Discharge Planning: °Goal: Ability to manage health-related needs will improve °Outcome: Adequate for Discharge °  °Problem: Clinical Measurements: °Goal: Ability to maintain clinical measurements within normal limits will improve °Outcome: Adequate for Discharge °Goal: Will remain free from infection °Outcome: Adequate for Discharge °Goal: Diagnostic test results will improve °Outcome: Adequate for Discharge °Goal: Respiratory complications will improve °Outcome: Adequate for Discharge °Goal: Cardiovascular complication will be avoided °Outcome: Adequate for Discharge °  °Problem: Activity: °Goal: Risk for activity intolerance will decrease °Outcome: Adequate for Discharge °  °Problem: Nutrition: °Goal: Adequate nutrition will be maintained °Outcome: Adequate for Discharge °  °Problem: Coping: °Goal: Level of anxiety will decrease °Outcome: Adequate for Discharge °  °Problem: Elimination: °Goal: Will not experience complications related to bowel motility °Outcome: Adequate for Discharge °Goal: Will not experience complications related to urinary retention °Outcome: Adequate for Discharge °  °Problem: Pain Managment: °Goal: General experience of comfort will improve °Outcome: Adequate for Discharge °  °Problem: Safety: °Goal: Ability to remain free from injury will improve °Outcome: Adequate for Discharge °  °Problem: Skin Integrity: °Goal: Risk for impaired skin integrity will decrease °Outcome: Adequate for Discharge °  °Problem: Education: °Goal: Knowledge of disease and its progression will improve °Outcome: Adequate for Discharge °Goal: Individualized Educational Video(s) °Outcome: Adequate for Discharge °  °Problem: Fluid Volume: °Goal: Compliance with  measures to maintain balanced fluid volume will improve °Outcome: Adequate for Discharge °  °Problem: Health Behavior/Discharge Planning: °Goal: Ability to manage health-related needs will improve °Outcome: Adequate for Discharge °  °Problem: Nutritional: °Goal: Ability to make healthy dietary choices will improve °Outcome: Adequate for Discharge °  °Problem: Clinical Measurements: °Goal: Complications related to the disease process, condition or treatment will be avoided or minimized °Outcome: Adequate for Discharge °  °

## 2021-08-01 ENCOUNTER — Telehealth (HOSPITAL_COMMUNITY): Payer: Self-pay | Admitting: Pharmacist

## 2021-08-01 ENCOUNTER — Other Ambulatory Visit (HOSPITAL_COMMUNITY): Payer: Self-pay

## 2021-08-01 NOTE — Telephone Encounter (Signed)
Transitions of Care Pharmacy   Call attempted for a pharmacy transitions of care follow-up. HIPAA appropriate voicemail was left with call back information provided.   Call attempt #1. Will follow-up in 1-3 days.

## 2021-08-02 ENCOUNTER — Other Ambulatory Visit (HOSPITAL_COMMUNITY): Payer: Self-pay

## 2021-08-02 ENCOUNTER — Telehealth (HOSPITAL_COMMUNITY): Payer: Self-pay | Admitting: Pharmacist

## 2021-08-02 DIAGNOSIS — Z48812 Encounter for surgical aftercare following surgery on the circulatory system: Secondary | ICD-10-CM | POA: Diagnosis not present

## 2021-08-02 DIAGNOSIS — I214 Non-ST elevation (NSTEMI) myocardial infarction: Secondary | ICD-10-CM | POA: Diagnosis not present

## 2021-08-02 DIAGNOSIS — N1832 Chronic kidney disease, stage 3b: Secondary | ICD-10-CM | POA: Diagnosis not present

## 2021-08-02 DIAGNOSIS — I1310 Hypertensive heart and chronic kidney disease without heart failure, with stage 1 through stage 4 chronic kidney disease, or unspecified chronic kidney disease: Secondary | ICD-10-CM | POA: Diagnosis not present

## 2021-08-02 NOTE — Telephone Encounter (Signed)
Transitions of Care Pharmacy   Call attempted for a pharmacy transitions of care follow-up. HIPAA appropriate voicemail was left with call back information provided.   Call attempt #2. Will follow-up in 1-3 days.

## 2021-08-03 ENCOUNTER — Telehealth (HOSPITAL_COMMUNITY): Payer: Self-pay

## 2021-08-03 NOTE — Telephone Encounter (Signed)
Transitions of Care Pharmacy   Call attempted for a pharmacy transitions of care follow-up. HIPAA appropriate voicemail was left with call back information provided.   Call attempt #3. Will no longer attempt follow up for TOC pharmacy.   

## 2021-08-11 ENCOUNTER — Other Ambulatory Visit (HOSPITAL_COMMUNITY): Payer: Self-pay

## 2021-08-14 ENCOUNTER — Other Ambulatory Visit (HOSPITAL_COMMUNITY): Payer: Self-pay

## 2021-08-15 NOTE — Progress Notes (Signed)
Cardiology Office Note:    Date:  08/16/2021   ID:  Tara Potts, DOB March 24, 1934, MRN [id number]  PCP:  [name], [place] Cardiologist: Quay Burow, MD   Reason for visit: Hospital follow-up  History of Present Illness:    Tara Potts is a 88 y.o. female with a hx of CAD status post CABG 2007, hypertension, hyperlipidemia, carotid disease.  She last saw Dr. Gwenlyn Found in February 2022.  Patient noted chronic bilateral leg pain & hip pain and walks with a cane.  She was mid to the hospital in January 2023 with chest pain, NSTEMI and hypertensive emergency.  Echocardiogram this admission shows ejection fraction 55 to 84%, grade 2 diastolic dysfunction, moderate left atrial enlargement.  Cardiac catheterization revealed patent saphenous vein to the distal right coronary artery, patent but highly diseased graft to the diagonal, patent LIMA to the LAD, 90% proximal circumflex and 70% OM.  LV function was normal with normal left ventricular end-diastolic pressure.  Medical therapy recommended.  During the hospitalization, she was noted to have paroxysmal atrial flutter.  She was on metoprolol for rate control and was started on Eliquis.  Today, patient comes in with her daughter.  She feels well.  Denies chest pain and shortness of breath.  She has chronic lower extremity edema, intolerant to compression stockings.  She denies PND, orthopnea and abdominal bloating.  Blood pressure controlled at home.  She states they have been on Repatha for approximately 2 months.     Past Medical History:  Diagnosis Date   Anemia    Arthritis    Asthma    as a child   CAD (coronary artery disease)    Carotid disease, bilateral ([place])    mild   Fibroid    Hx of CABG 2007   [facility]   Hyperlipemia    Hypertension    Lumbosacral radiculopathy 09/10/2014   Menopausal symptoms    Multiple myeloma ([place]) 05/26/2018   Peripheral neuropathy    Seizure-like activity ([facility])      Past Surgical History:  Procedure Laterality Date   CARDIAC CATHETERIZATION  01/09/2008   diffusely diseased graft to the PDA & diffuse PDA disease   CAROTID DOPPLER  08/27/2011   mod. right & nild left ICA stenosis   CATARACT SURGERY     CORONARY ARTERY BYPASS GRAFT     FOOT SURGERY     JOINT REPLACEMENT     Right hip Dr. Alvan Dame 01-28-18    LEFT HEART CATH AND CORS/GRAFTS ANGIOGRAPHY N/A 07/18/2021   Procedure: LEFT HEART CATH AND CORS/GRAFTS ANGIOGRAPHY;  Surgeon: Belva Crome, MD;  Location: [facility];  Service: Cardiovascular;  Laterality: N/A;   MASS REMOVED FROM CERVIX  12/1997   Benign endocervical inclusion cysts   NM MYOVIEW LTD  01/12/2011   no ischemia   TOTAL HIP ARTHROPLASTY Right 01/28/2018   Procedure: RIGHT TOTAL HIP ARTHROPLASTY ANTERIOR APPROACH;  Surgeon: Paralee Cancel, MD;  Location: [facility];  Service: Orthopedics;  Laterality: Right;  70 mins   TRIPLE HEART BYPASS  05/2006   [facility]   US ECHOCARDIOGRAPHY  10/30/2007   mild MA,MR,TR,AOV mildly sclerotic,density oted in the prox asc aorta    Current Medications: Current Meds  Medication Sig   apixaban (ELIQUIS) 2.5 MG TABS tablet Take 1 tablet (2.5 mg total) by mouth 2 (two) times daily.   apixaban (ELIQUIS) 2.5 MG TABS tablet Take 1 tablet (2.5 mg total) by mouth 2 (  two) times daily.   ezetimibe (ZETIA) 10 MG tablet Take 10 mg by mouth daily.   hydrALAZINE (APRESOLINE) 25 MG tablet Take 2 tablets (50 mg total) by mouth 3 (three) times daily.   isosorbide mononitrate (IMDUR) 60 MG 24 hr tablet Take 1 tablet (60 mg total) by mouth daily.   losartan (COZAAR) 100 MG tablet Take 100 mg by mouth daily.   metoprolol tartrate (LOPRESSOR) 25 MG tablet Take 1 tablet (25 mg total) by mouth 2 (two) times daily.   potassium chloride (KLOR-CON) 10 MEQ tablet TAKE 1 TABLET(10 MEQ) BY MOUTH DAILY (Patient taking differently: Take 10 mEq by mouth daily. TAKE 1 TABLET(10 MEQ) BY MOUTH DAILY)   REPATHA SURECLICK  314 MG/ML SOAJ Inject 1 mL into the skin every 30 (thirty) days.     Allergies:   Codeine, Cymbalta [duloxetine hcl], Ezetimibe, and Gabapentin   Social History   Socioeconomic History   Marital status: Widowed    Spouse name: Not on file   Number of children: 1   Years of education: college   Highest education level: Not on file  Occupational History   Not on file  Tobacco Use   Smoking status: Former   Smokeless tobacco: Never   Tobacco comments:    Quit 30 years ago  Vaping Use   Vaping Use: Never used  Substance and Sexual Activity   Alcohol use: No    Alcohol/week: 0.0 standard drinks   Drug use: No   Sexual activity: Not Currently    Birth control/protection: Post-menopausal    Comment: intercourse age 49, more than 5 sexual partners  Other Topics Concern   Not on file  Social History Narrative   Patient lives at home and her daughter and son in law live with her. Widowed.   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   Education college.   Left handed.   Caffeine     Social Determinants of Radio broadcast assistant Strain: Not on file  Food Insecurity: Not on file  Transportation Needs: Not on file  Physical Activity: Not on file  Stress: Not on file  Social Connections: Not on file     Family History: The patient's family history includes Breast cancer in her maternal aunt; Diabetes in her mother; Heart disease in her mother; Heart failure in her maternal aunt, maternal uncle, and mother; Hypertension in her mother and sister.  ROS:   Please see the history of present illness.     EKGs/Labs/Other Studies Reviewed:    EKG:  The ekg ordered today demonstrates normal sinus rhythm, nonspecific T wave abnormality unchanged from last EKG, heart 69, PR interval 174 ms, QRS duration 86 ms.  Recent Labs: 07/18/2021: ALT 19 07/19/2021: BUN 26; Creatinine, Ser 1.79; Hemoglobin 8.2; Magnesium 1.7; Platelets 190; Potassium 3.9; Sodium 134; TSH 2.142   Recent Lipid  Panel Lab Results  Component Value Date/Time   CHOL 175 07/19/2021 02:54 AM   CHOL 218 (H) 05/21/2019 12:05 PM   TRIG 43 07/19/2021 02:54 AM   HDL 37 (L) 07/19/2021 02:54 AM   HDL 57 05/21/2019 12:05 PM   LDLCALC 129 (H) 07/19/2021 02:54 AM   LDLCALC 146 (H) 05/21/2019 12:05 PM    Physical Exam:    VS:  BP (!) 132/52    Pulse 69    Ht 5' 6.05" (1.678 m)    Wt 189 lb 3.2 oz (85.8 kg)    LMP 07/16/2002    SpO2 99%  BMI 30.49 kg/m    No data found.  Wt Readings from Last 3 Encounters:  08/16/21 189 lb 3.2 oz (85.8 kg)  07/16/21 185 lb 13.6 oz (84.3 kg)  10/03/20 199 lb 1.6 oz (90.3 kg)     GEN:  Well nourished, well developed in no acute distress HEENT: Normal NECK: No JVD; + bilateral carotid bruits CARDIAC: RRR RESPIRATORY:  Clear to auscultation without rales, wheezing or rhonchi  ABDOMEN: Soft, non-tender, non-distended MUSCULOSKELETAL: 1+ LE edema below knees bilaterally SKIN: Warm and dry NEUROLOGIC:  Alert and oriented PSYCHIATRIC:  Normal affect     ASSESSMENT AND PLAN   CAD Status post CABG in 2007; no angina -LHC 07/2021: patent saphenous vein to the distal right coronary artery, patent but highly diseased graft to the diagonal, patent LIMA to the LAD, 90% proximal circumflex and 70% OM.  -Not on antiplatelet secondary to need for anticoagulation. -Continue lipid therapy. -Continue Imdur for anti-anginal management.   -Continue beta-blocker and ARB.  Paroxysmal atrial flutter, in NSR -Continue metoprolol for rate control -Continue Eliquis 2.5 mg daily for stroke prevention (lower dose given age and elevated creatinine).  Given samples today -worried about medication cost.  Told patient to contact us if high cost on refill. -Given a patient education regarding signs of stroke.    Carotid artery disease -History of moderate bilateral ICA stenosis, R>L -Monitored by annual carotid duplex (next due 04/2022)  Bilateral lower extremity edema -Likely secondary  to venous insufficiency; intolerant to compression stockings. -Previously on spironolactone & lasix but discontinued secondary to renal failure -We discussed increasing mobility, leg elevation and salt restriction.  She has no other signs of heart failure.  Hypertension, well controlled -Patient had blood pressures up to 180s 190s during her hospital admission last month. -Is doing well on current medication regimen -continue for now. -Goal BP is <130/80.  Recommend DASH diet (high in vegetables, fruits, low-fat dairy products, whole grains, poultry, fish, and nuts and low in sweets, sugar-sweetened beverages, and red meats), salt restriction and increase physical activity.  Hyperlipidemia with goal LDL less than 70 -LDL 129 in January 2023.  Recently started Repatha. -Recheck fasting lipids in 1 month. -Discussed cholesterol lowering diets - Mediterranean diet, DASH diet, vegetarian diet, low-carbohydrate diet and avoidance of trans fats.  Discussed healthier choice substitutes.  Nuts, high-fiber foods, and fiber supplements may also improve lipids.    Disposition - Follow-up in 6 months with Dr. Gwenlyn Found.       Medication Adjustments/Labs and Tests Ordered: Current medicines are reviewed at length with the patient today.  Concerns regarding medicines are outlined above.  Orders Placed This Encounter  Procedures   Lipid panel   Comprehensive metabolic panel   EKG 37-CHYI   Meds ordered this encounter  Medications   apixaban (ELIQUIS) 2.5 MG TABS tablet    Sig: Take 1 tablet (2.5 mg total) by mouth 2 (two) times daily.    Dispense:  28 tablet    Refill:  0    Patient Instructions  Medication Instructions:  No Changes *If you need a refill on your cardiac medications before your next appointment, please call your pharmacy*   Lab Work: CMET, Lipid Panel, To Be Done In 1 Month If you have labs (blood work) drawn today and your tests are completely normal, you will receive your  results only by: [place] (if you have MyChart) OR A paper copy in the mail If you have any lab test that is abnormal or  we need to change your treatment, we will call you to review the results.   Testing/Procedures: No Testing   Follow-Up: At [facility], you and your health needs are our priority.  As part of our continuing mission to provide you with exceptional heart care, we have created designated Provider Care Teams.  These Care Teams include your primary Cardiologist (physician) and Advanced Practice Providers (APPs -  Physician Assistants and Nurse Practitioners) who all work together to provide you with the care you need, when you need it.  We recommend signing up for the patient portal called "MyChart".  Sign up information is provided on this After Visit Summary.  MyChart is used to connect with patients for Virtual Visits (Telemedicine).  Patients are able to view lab/test results, encounter notes, upcoming appointments, etc.  Non-urgent messages can be sent to your provider as well.   To learn more about what you can do with MyChart, go to [email or web address].    Your next appointment:   6 month(s)  The format for your next appointment:   In Person  Provider:   Quay Burow, MD         Signed, Warren Lacy, PA-C  08/16/2021 11:46 AM    [place]

## 2021-08-16 ENCOUNTER — Other Ambulatory Visit: Payer: Self-pay

## 2021-08-16 ENCOUNTER — Other Ambulatory Visit (HOSPITAL_COMMUNITY): Payer: Self-pay

## 2021-08-16 ENCOUNTER — Encounter: Payer: Self-pay | Admitting: Physician Assistant

## 2021-08-16 ENCOUNTER — Ambulatory Visit: Payer: Medicare HMO | Admitting: Physician Assistant

## 2021-08-16 VITALS — BP 132/52 | HR 69 | Ht 66.05 in | Wt 189.2 lb

## 2021-08-16 DIAGNOSIS — E785 Hyperlipidemia, unspecified: Secondary | ICD-10-CM

## 2021-08-16 DIAGNOSIS — I2510 Atherosclerotic heart disease of native coronary artery without angina pectoris: Secondary | ICD-10-CM | POA: Diagnosis not present

## 2021-08-16 DIAGNOSIS — I6523 Occlusion and stenosis of bilateral carotid arteries: Secondary | ICD-10-CM

## 2021-08-16 DIAGNOSIS — I10 Essential (primary) hypertension: Secondary | ICD-10-CM | POA: Diagnosis not present

## 2021-08-16 DIAGNOSIS — Z951 Presence of aortocoronary bypass graft: Secondary | ICD-10-CM

## 2021-08-16 MED ORDER — APIXABAN 2.5 MG PO TABS: 2.5000 mg | ORAL_TABLET | Freq: Two times a day (BID) | ORAL | 0 refills | Status: DC

## 2021-08-16 NOTE — Patient Instructions (Signed)
Medication Instructions:  No Changes *If you need a refill on your cardiac medications before your next appointment, please call your pharmacy*   Lab Work: CMET, Lipid Panel, To Be Done In 1 Month If you have labs (blood work) drawn today and your tests are completely normal, you will receive your results only by: [place] (if you have MyChart) OR A paper copy in the mail If you have any lab test that is abnormal or we need to change your treatment, we will call you to review the results.   Testing/Procedures: No Testing   Follow-Up: At [facility], you and your health needs are our priority.  As part of our continuing mission to provide you with exceptional heart care, we have created designated Provider Care Teams.  These Care Teams include your primary Cardiologist (physician) and Advanced Practice Providers (APPs -  Physician Assistants and Nurse Practitioners) who all work together to provide you with the care you need, when you need it.  We recommend signing up for the patient portal called "MyChart".  Sign up information is provided on this After Visit Summary.  MyChart is used to connect with patients for Virtual Visits (Telemedicine).  Patients are able to view lab/test results, encounter notes, upcoming appointments, etc.  Non-urgent messages can be sent to your provider as well.   To learn more about what you can do with MyChart, go to [email or web address].    Your next appointment:   6 month(s)  The format for your next appointment:   In Person  Provider:   Quay Burow, MD

## 2021-09-07 DIAGNOSIS — M791 Myalgia, unspecified site: Secondary | ICD-10-CM | POA: Diagnosis not present

## 2021-09-07 DIAGNOSIS — I252 Old myocardial infarction: Secondary | ICD-10-CM | POA: Diagnosis not present

## 2021-09-07 DIAGNOSIS — E559 Vitamin D deficiency, unspecified: Secondary | ICD-10-CM | POA: Diagnosis not present

## 2021-09-07 DIAGNOSIS — T466X5A Adverse effect of antihyperlipidemic and antiarteriosclerotic drugs, initial encounter: Secondary | ICD-10-CM | POA: Diagnosis not present

## 2021-09-07 DIAGNOSIS — I480 Paroxysmal atrial fibrillation: Secondary | ICD-10-CM | POA: Diagnosis not present

## 2021-09-07 DIAGNOSIS — E782 Mixed hyperlipidemia: Secondary | ICD-10-CM | POA: Diagnosis not present

## 2021-09-07 DIAGNOSIS — I10 Essential (primary) hypertension: Secondary | ICD-10-CM | POA: Diagnosis not present

## 2021-09-07 DIAGNOSIS — N183 Chronic kidney disease, stage 3 unspecified: Secondary | ICD-10-CM | POA: Diagnosis not present

## 2021-09-12 DIAGNOSIS — E559 Vitamin D deficiency, unspecified: Secondary | ICD-10-CM | POA: Diagnosis not present

## 2021-09-12 DIAGNOSIS — I10 Essential (primary) hypertension: Secondary | ICD-10-CM | POA: Diagnosis not present

## 2021-09-12 DIAGNOSIS — E782 Mixed hyperlipidemia: Secondary | ICD-10-CM | POA: Diagnosis not present

## 2021-09-12 DIAGNOSIS — I252 Old myocardial infarction: Secondary | ICD-10-CM | POA: Diagnosis not present

## 2021-09-13 DIAGNOSIS — I6523 Occlusion and stenosis of bilateral carotid arteries: Secondary | ICD-10-CM | POA: Diagnosis not present

## 2021-09-13 DIAGNOSIS — I2510 Atherosclerotic heart disease of native coronary artery without angina pectoris: Secondary | ICD-10-CM | POA: Diagnosis not present

## 2021-09-13 DIAGNOSIS — E785 Hyperlipidemia, unspecified: Secondary | ICD-10-CM | POA: Diagnosis not present

## 2021-09-13 DIAGNOSIS — Z951 Presence of aortocoronary bypass graft: Secondary | ICD-10-CM | POA: Diagnosis not present

## 2021-09-13 DIAGNOSIS — I10 Essential (primary) hypertension: Secondary | ICD-10-CM | POA: Diagnosis not present

## 2021-09-14 LAB — COMPREHENSIVE METABOLIC PANEL
ALT: 19 IU/L (ref 0–32)
AST: 32 IU/L (ref 0–40)
Albumin/Globulin Ratio: 0.9 — ABNORMAL LOW (ref 1.2–2.2)
Albumin: 4.2 g/dL (ref 3.6–4.6)
Alkaline Phosphatase: 78 IU/L (ref 44–121)
BUN/Creatinine Ratio: 15 (ref 12–28)
BUN: 25 mg/dL (ref 8–27)
Bilirubin Total: 0.4 mg/dL (ref 0.0–1.2)
CO2: 20 mmol/L (ref 20–29)
Calcium: 9.2 mg/dL (ref 8.7–10.3)
Chloride: 98 mmol/L (ref 96–106)
Creatinine, Ser: 1.68 mg/dL — ABNORMAL HIGH (ref 0.57–1.00)
Globulin, Total: 4.9 g/dL — ABNORMAL HIGH (ref 1.5–4.5)
Glucose: 79 mg/dL (ref 70–99)
Potassium: 4.6 mmol/L (ref 3.5–5.2)
Sodium: 138 mmol/L (ref 134–144)
Total Protein: 9.1 g/dL — ABNORMAL HIGH (ref 6.0–8.5)
eGFR: 29 mL/min/{1.73_m2} — ABNORMAL LOW (ref >59)

## 2021-09-14 LAB — LIPID PANEL
Chol/HDL Ratio: 5.4 ratio — ABNORMAL HIGH (ref 0.0–4.4)
Cholesterol, Total: 283 mg/dL — ABNORMAL HIGH (ref 100–199)
HDL: 52 mg/dL (ref >39)
LDL Chol Calc (NIH): 213 mg/dL — ABNORMAL HIGH (ref 0–99)
Triglycerides: 104 mg/dL (ref 0–149)
VLDL Cholesterol Cal: 18 mg/dL (ref 5–40)

## 2021-09-25 DIAGNOSIS — Z7982 Long term (current) use of aspirin: Secondary | ICD-10-CM | POA: Diagnosis not present

## 2021-09-25 DIAGNOSIS — E782 Mixed hyperlipidemia: Secondary | ICD-10-CM | POA: Diagnosis not present

## 2021-09-25 DIAGNOSIS — R7309 Other abnormal glucose: Secondary | ICD-10-CM | POA: Diagnosis not present

## 2021-09-25 DIAGNOSIS — I10 Essential (primary) hypertension: Secondary | ICD-10-CM | POA: Diagnosis not present

## 2021-10-04 ENCOUNTER — Telehealth: Payer: Self-pay

## 2021-10-04 NOTE — Telephone Encounter (Addendum)
Called patient regarding results. Left message for patient to call office.Letter mailed out today 10/04/21----- Message from Warren Lacy, PA-C sent at 09/14/2021  1:29 PM EST ----- ?LDL, bad cholesterol up despite Repatha.  Left voicemail for her to call us back - need to check compliance if really taking it.  If she is taking it regularly, refer back to lipid pharmacist & Dr. Debara Pickett for further review. ?

## 2021-10-24 DIAGNOSIS — Z7982 Long term (current) use of aspirin: Secondary | ICD-10-CM | POA: Diagnosis not present

## 2021-10-24 DIAGNOSIS — I10 Essential (primary) hypertension: Secondary | ICD-10-CM | POA: Diagnosis not present

## 2021-10-24 DIAGNOSIS — E7800 Pure hypercholesterolemia, unspecified: Secondary | ICD-10-CM | POA: Diagnosis not present

## 2021-10-24 DIAGNOSIS — N184 Chronic kidney disease, stage 4 (severe): Secondary | ICD-10-CM | POA: Diagnosis not present

## 2021-10-30 ENCOUNTER — Ambulatory Visit: Payer: Medicare HMO | Admitting: Podiatrist

## 2021-11-01 ENCOUNTER — Other Ambulatory Visit: Payer: Self-pay | Admitting: Internal Medicine

## 2021-11-01 IMAGING — CR DG CHEST 2V
2 series · 2 of 2 positions shown · non-contrast
Comparison: None.

CLINICAL DATA: Shortness of breath

EXAM:
CHEST - 2 VIEW

[w chest pa]
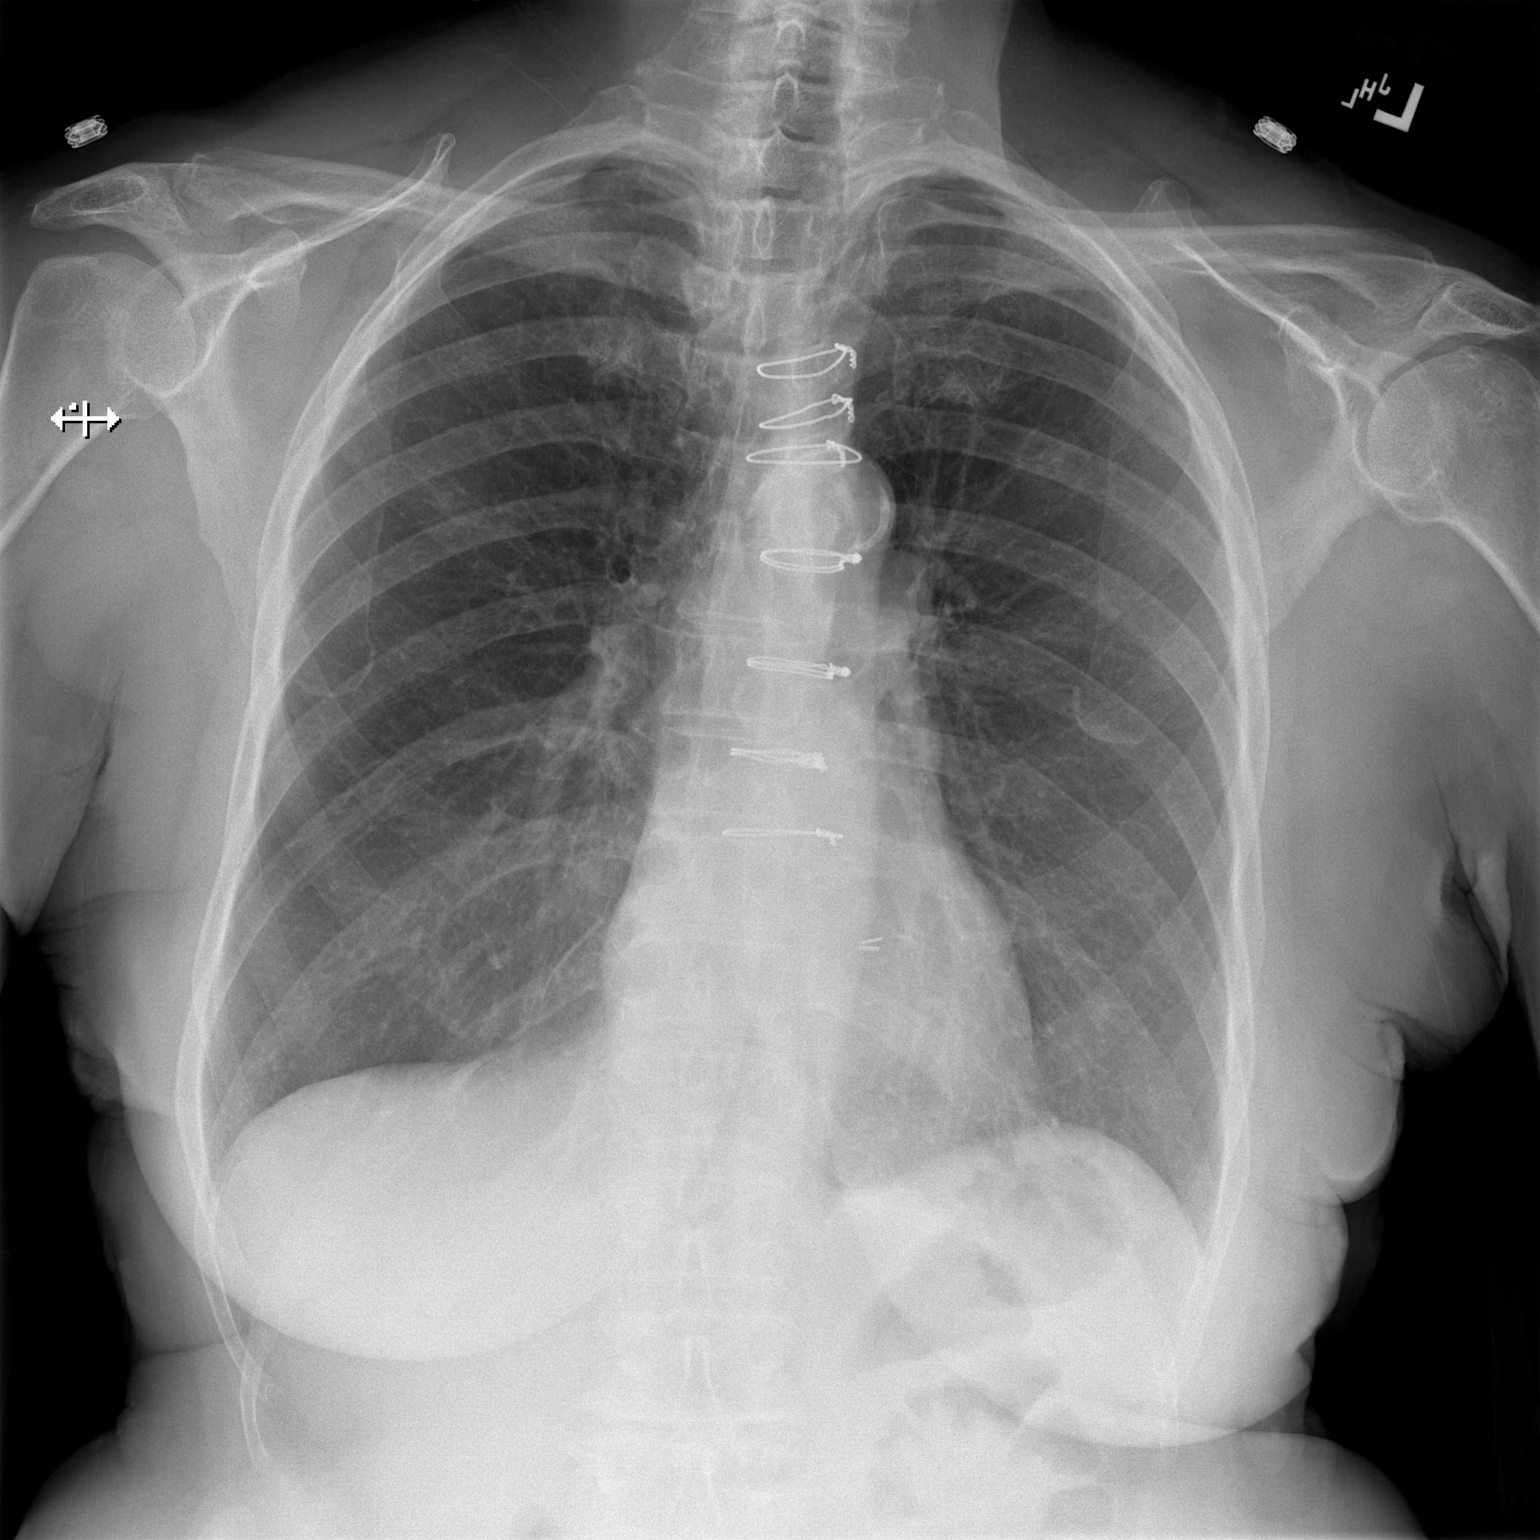

[w chest lat]
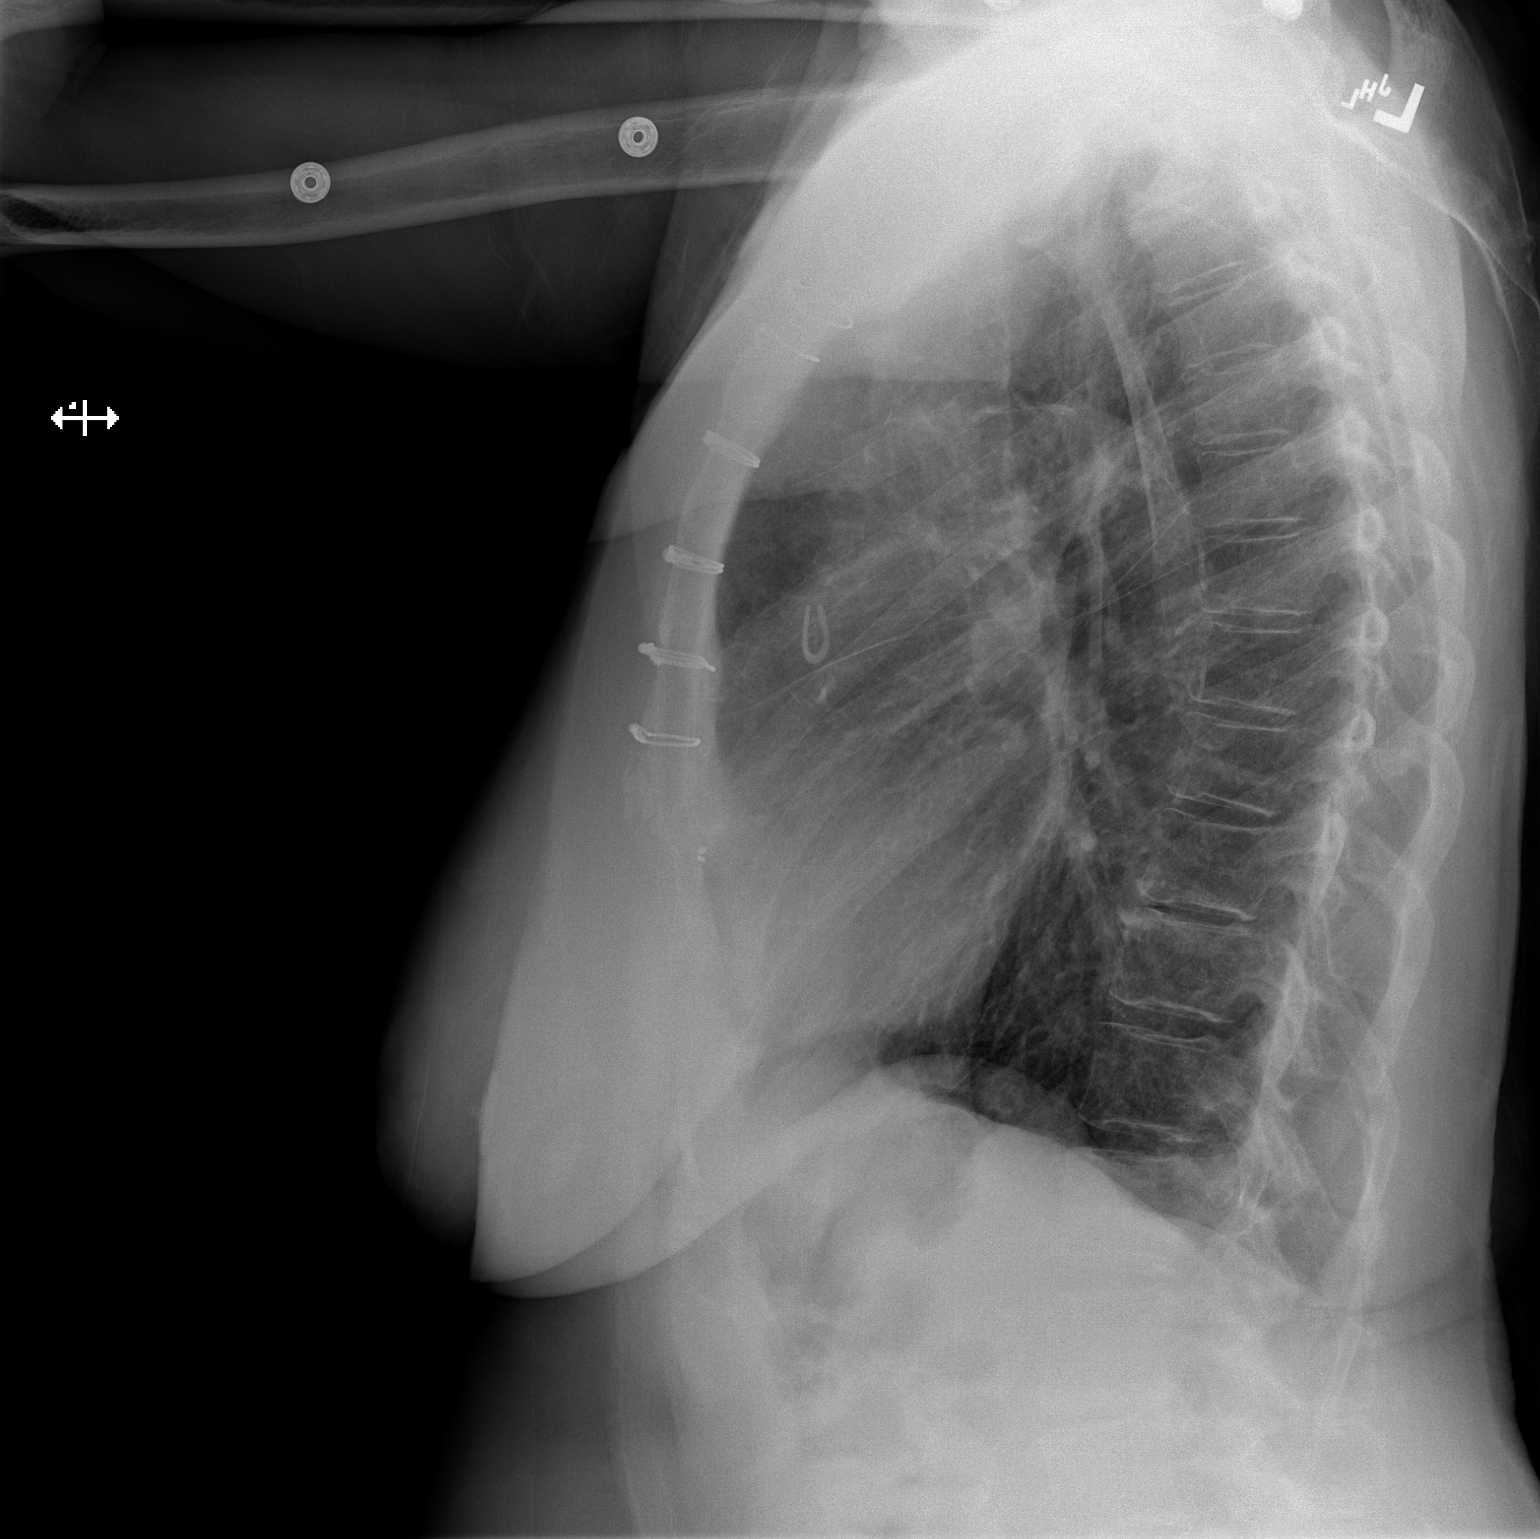

[2 of 2 positions shown; findings below may reference images not displayed]

FINDINGS: The heart size and mediastinal contours are within normal limits.
Both lungs are clear. No pleural effusion or pneumothorax. The
visualized skeletal structures are unremarkable.
IMPRESSION: No acute process in the chest.

## 2021-11-02 IMAGING — US US RENAL
1 series · 14 of 25 positions shown · non-contrast
Comparison: Ultrasound 08/22/2018

CLINICAL DATA: Elevated creatinine.

EXAM:
RENAL / URINARY TRACT ULTRASOUND COMPLETE

[Series 1: us renal · 14 of 28 slices shown]
[im 1/28]
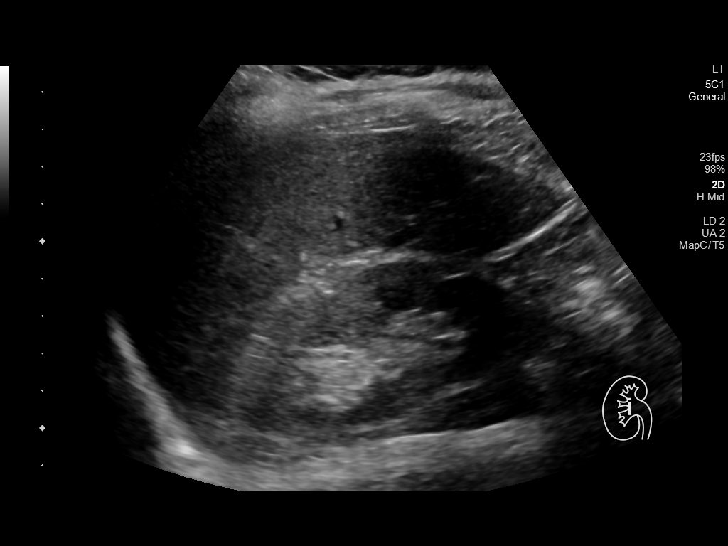
[im 3/28]
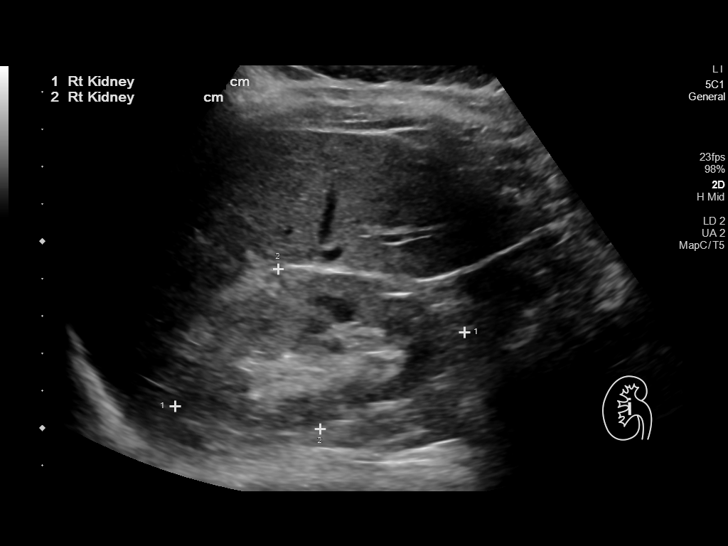
[im 5/28]
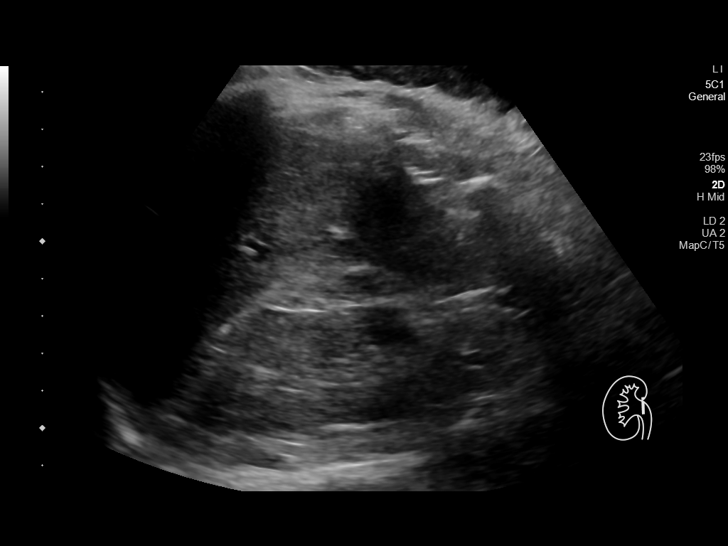
[im 7/28]
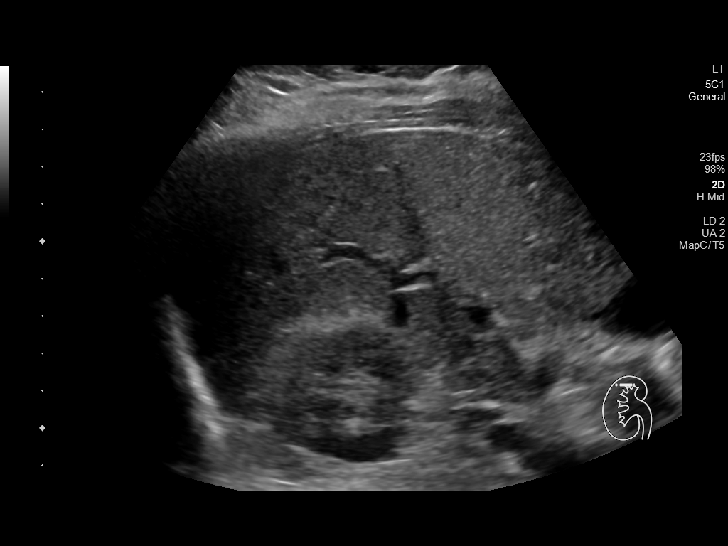
[im 10/28]
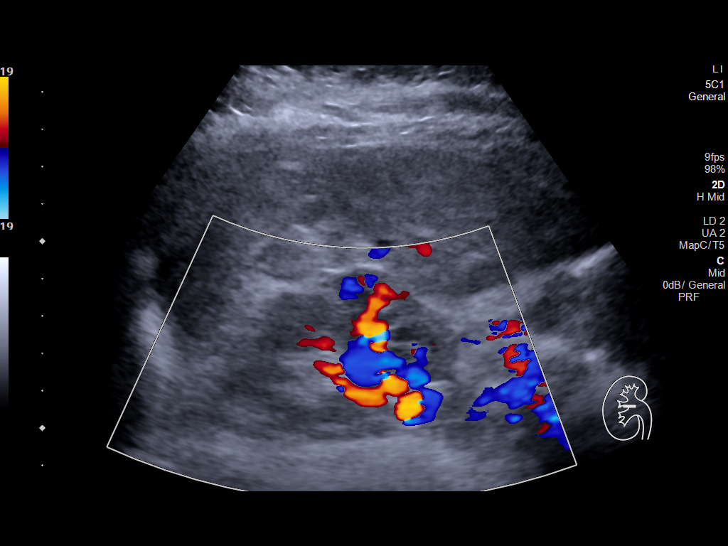
[im 11/28]
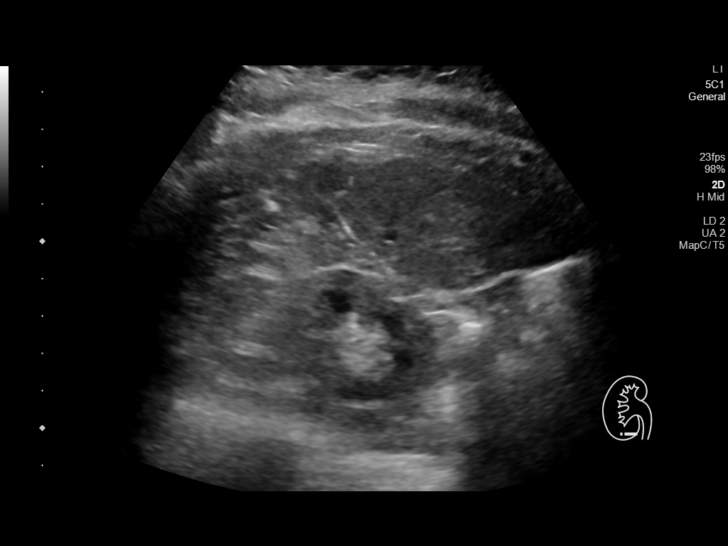
[im 13/28]
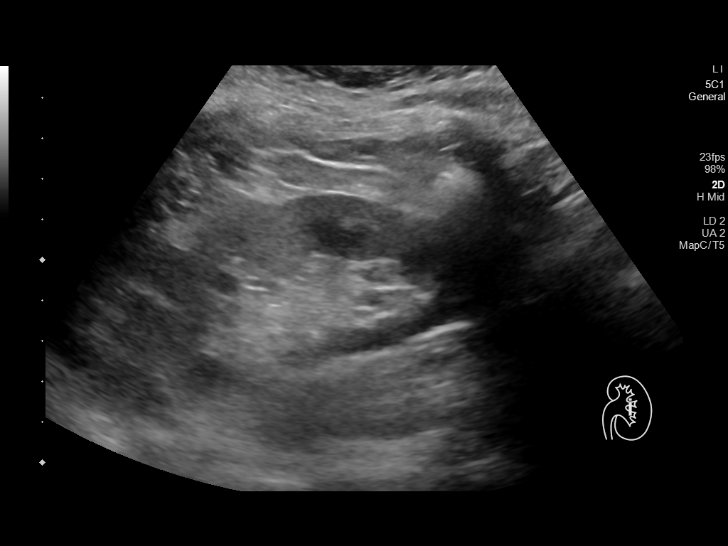
[im 15/28]
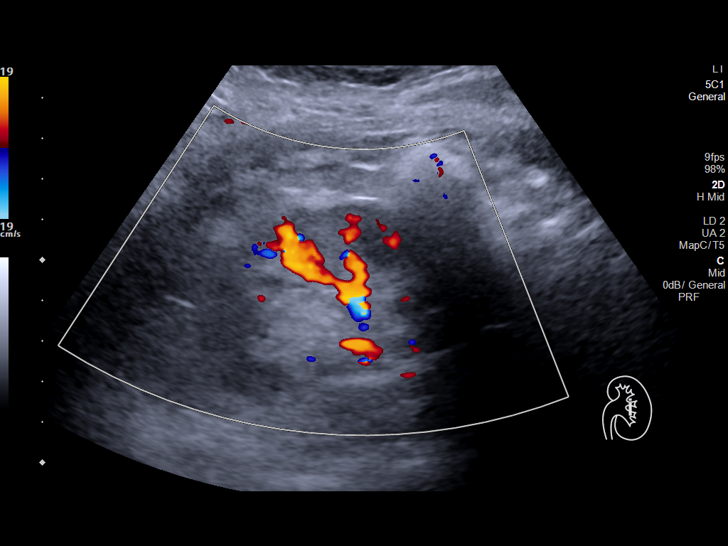
[im 17/28]
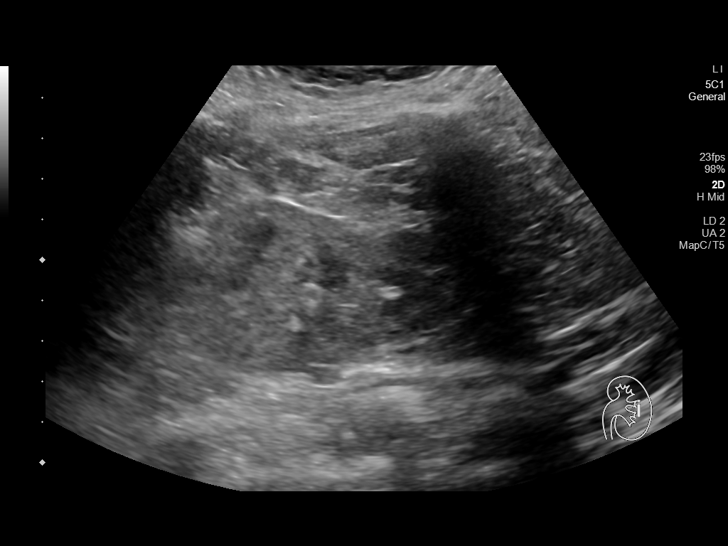
[im 19/28]
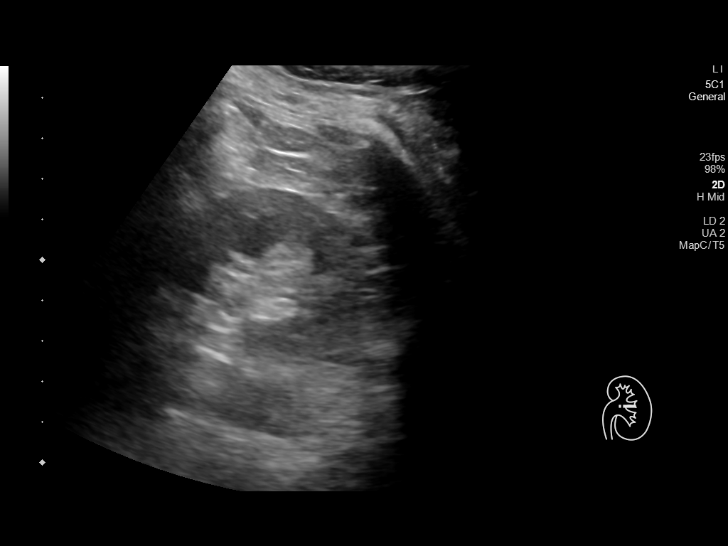
[im 21/28]
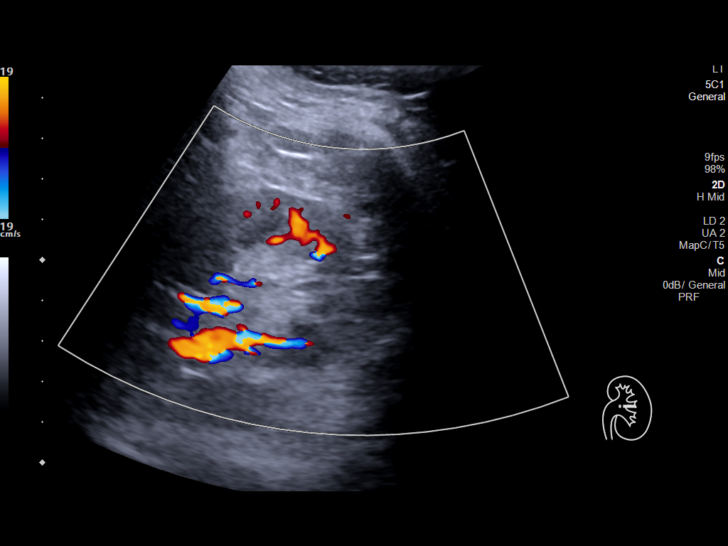
[im 23/28]
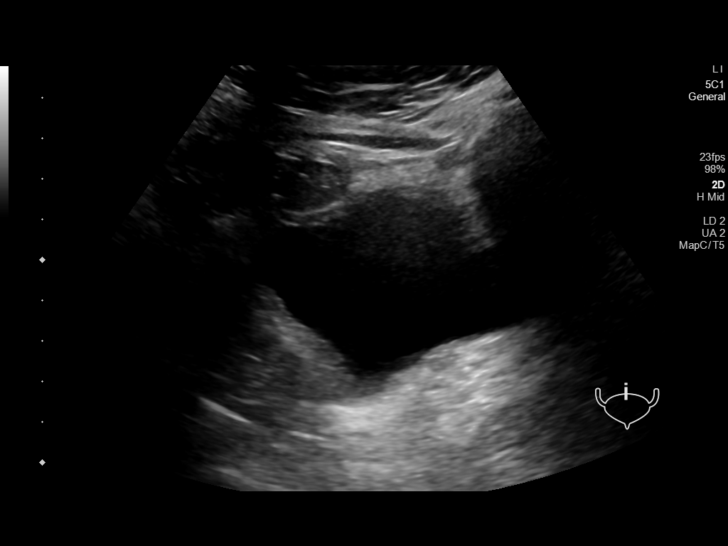
[im 25/28]
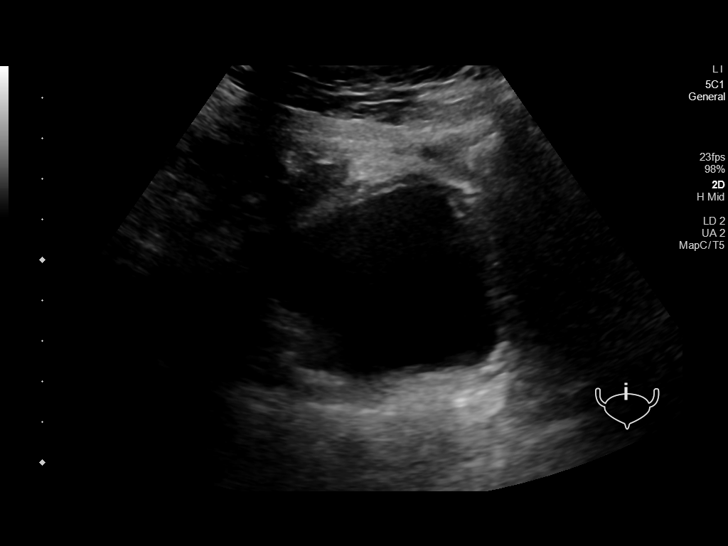
[im 28/28]
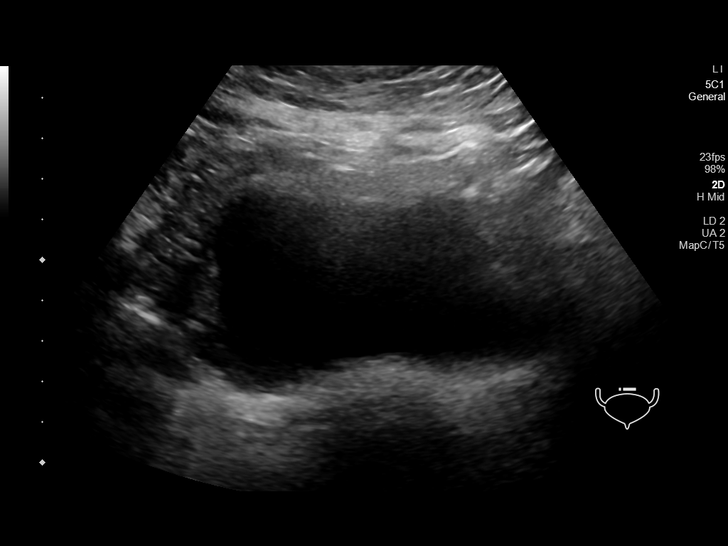

[14 of 25 positions shown; findings below may reference images not displayed]

FINDINGS: Right Kidney:

Renal measurements: 8.0 x 4.4 x 4.9 cm = volume: 92.0 mL. Normal
renal cortical thickness and echogenicity for age. No worrisome
renal lesions, renal calculi or hydronephrosis.

Left Kidney:

Renal measurements: 8.5 x 4.2 x 5.0 cm = volume: 93.0 mL. Normal
renal cortical thickness and echogenicity for age. No worrisome
renal lesions, renal calculi or hydronephrosis.

Bladder:

Normal

Other:

None.
IMPRESSION: Unremarkable renal ultrasound examination.

## 2021-11-06 ENCOUNTER — Ambulatory Visit: Payer: Medicare HMO | Admitting: Podiatrist

## 2021-11-06 DIAGNOSIS — M2042 Other hammer toe(s) (acquired), left foot: Secondary | ICD-10-CM

## 2021-11-06 DIAGNOSIS — M79675 Pain in left toe(s): Secondary | ICD-10-CM | POA: Diagnosis not present

## 2021-11-06 DIAGNOSIS — B351 Tinea unguium: Secondary | ICD-10-CM

## 2021-11-06 DIAGNOSIS — M79674 Pain in right toe(s): Secondary | ICD-10-CM | POA: Diagnosis not present

## 2021-11-06 DIAGNOSIS — I999 Unspecified disorder of circulatory system: Secondary | ICD-10-CM

## 2021-11-06 DIAGNOSIS — M2041 Other hammer toe(s) (acquired), right foot: Secondary | ICD-10-CM

## 2021-11-06 NOTE — Patient Instructions (Signed)

## 2021-11-07 DIAGNOSIS — I480 Paroxysmal atrial fibrillation: Secondary | ICD-10-CM | POA: Diagnosis not present

## 2021-11-07 DIAGNOSIS — D631 Anemia in chronic kidney disease: Secondary | ICD-10-CM | POA: Diagnosis not present

## 2021-11-07 DIAGNOSIS — I252 Old myocardial infarction: Secondary | ICD-10-CM | POA: Diagnosis not present

## 2021-11-07 DIAGNOSIS — N184 Chronic kidney disease, stage 4 (severe): Secondary | ICD-10-CM | POA: Diagnosis not present

## 2021-11-07 DIAGNOSIS — I739 Peripheral vascular disease, unspecified: Secondary | ICD-10-CM | POA: Diagnosis not present

## 2021-11-07 DIAGNOSIS — I10 Essential (primary) hypertension: Secondary | ICD-10-CM | POA: Diagnosis not present

## 2021-11-07 DIAGNOSIS — Z0000 Encounter for general adult medical examination without abnormal findings: Secondary | ICD-10-CM | POA: Diagnosis not present

## 2021-11-07 DIAGNOSIS — C9000 Multiple myeloma not having achieved remission: Secondary | ICD-10-CM | POA: Diagnosis not present

## 2021-11-07 DIAGNOSIS — R413 Other amnesia: Secondary | ICD-10-CM | POA: Diagnosis not present

## 2021-11-11 ENCOUNTER — Encounter: Payer: Self-pay | Admitting: Podiatrist

## 2021-11-11 NOTE — Progress Notes (Signed)
Subjective: ?Tara DAVEE is a 88 y.o. female patient who presents to office today complaining of long,mildly painful nails  while ambulating in shoes; unable to trim.  Patient denies any new changes in medication or new problems. Patient denies any new cramping, numbness, burning or tingling in the legs or feet. She takes Eliquis.   ? ?Patient Active Problem List  ? Diagnosis Date Noted  ? NSTEMI (non-ST elevated myocardial infarction) ([place]) 07/17/2021  ? Hypertensive emergency 07/16/2021  ? Normocytic anemia 07/16/2021  ? Acute lower UTI 06/21/2020  ? Dehydration 06/21/2020  ? Acute renal failure superimposed on stage 3 chronic kidney disease ([place]) 06/21/2020  ? Hyponatremia 06/21/2020  ? Gait abnormality 04/14/2020  ? Peripheral neuropathy 04/14/2020  ? CRI (chronic renal insufficiency), stage 3 (moderate) ([place]) 11/20/2019  ? Bilateral lower extremity edema 10/23/2019  ? Multiple myeloma ([place]) 05/26/2018  ? Goals of care, counseling/discussion 05/26/2018  ? Class 1 obesity 01/29/2018  ? S/P right THA, AA 01/28/2018  ? S/P hip replacement 01/28/2018  ? Carotid artery disease ([place]) 04/12/2017  ? Passed out 10/05/2015  ? Lumbosacral radiculopathy 09/10/2014  ? Cervical stenosis of spinal canal 09/10/2014  ? Abnormality of gait 07/27/2014  ? Paresthesia 07/27/2014  ? Bilateral leg cramps 02/21/2014  ? Hx of CABG 01/13/2013  ? Peripheral vascular disease:  Moderate right and mild left ICA stenosis. 01/13/2013  ? Essential hypertension 01/13/2013  ? Dyslipidemia, goal LDL below 70   ? Menopause 02/12/2011  ? Leiomyoma of body of uterus 02/12/2011  ? Post-menopausal atrophic vaginitis 02/12/2011  ? Urinary frequency 02/12/2011  ? UNSPECIFIED PERIPHERAL VASCULAR DISEASE 07/11/2010  ? ?Current Outpatient Medications on File Prior to Visit  ?Medication Sig Dispense Refill  ? apixaban (ELIQUIS) 2.5 MG TABS tablet Take 1 tablet (2.5 mg total) by mouth 2 (two) times daily. 60 tablet 3  ? apixaban (ELIQUIS) 2.5 MG TABS tablet  Take 1 tablet (2.5 mg total) by mouth 2 (two) times daily. 28 tablet 0  ? ezetimibe (ZETIA) 10 MG tablet TAKE 1 TABLET BY MOUTH EVERY DAY 90 tablet 3  ? hydrALAZINE (APRESOLINE) 25 MG tablet Take 2 tablets (50 mg total) by mouth 3 (three) times daily. 90 tablet 3  ? hydrALAZINE (APRESOLINE) 50 MG tablet 1 tablet with food    ? isosorbide mononitrate (IMDUR) 60 MG 24 hr tablet Take 1 tablet (60 mg total) by mouth daily. 30 tablet 3  ? losartan (COZAAR) 100 MG tablet Take 100 mg by mouth daily.    ? metoprolol tartrate (LOPRESSOR) 25 MG tablet Take 1 tablet (25 mg total) by mouth 2 (two) times daily. 60 tablet 3  ? potassium chloride (KLOR-CON) 10 MEQ tablet TAKE 1 TABLET(10 MEQ) BY MOUTH DAILY (Patient taking differently: Take 10 mEq by mouth daily. TAKE 1 TABLET(10 MEQ) BY MOUTH DAILY) 90 tablet 3  ? REPATHA SURECLICK 086 MG/ML SOAJ Inject 1 mL into the skin every 30 (thirty) days.    ? ?No current facility-administered medications on file prior to visit.  ? ?Allergies  ?Allergen Reactions  ? Codeine Nausea And Vomiting  ?  confusion  ? Cymbalta [Duloxetine Hcl] Nausea Only  ? Ezetimibe   ?  Other reaction(s): cramping  ? Gabapentin   ?  Other reaction(s): felt crazy  ? ? ? ?Objective: ?General: Patient is awake, alert, and oriented x 3 and in no acute distress. ? ?Integument: Skin is warm, dry and supple bilateral. Nails are tender, long, thickened and  ?dystrophic with subungual debris,  consistent with onychomycosis, 1-5 bilateral. No signs of infection. No open lesions noted.  Small callus sub hallux right is present.  Remaining integument unremarkable. ? ?Vasculature:  Dorsalis Pedis pulse 1/4 bilateral. Posterior Tibial pulse  0/4 bilateral.  ?Capillary fill time <3 sec 1-5 bilateral. Positive hair growth to the level of the digits. ?Temperature gradient within normal limits. Mild edema present bilateral.  ? ?Neurology: The patient has decreased sensation measured with a 5.07/10g Semmes Weinstein Monofilament  at 2/5 pedal sites bilateral . Vibratory sensation diminished bilateral with tuning fork. No Babinski sign present bilateral.  ? ?Musculoskeletal: No symptomatic pedal deformities noted bilateral. Contracuture of lesser digits is seen bilateral. Muscular strength 5/5 in all lower extremity muscular groups bilateral without pain on range of motion . No tenderness with calf compression bilateral. ? ?Assessment and Plan: ?Problem List Items Addressed This Visit   ?None ?Visit Diagnoses   ? ? Pain due to onychomycosis of toenails of both feet    -  Primary  ? Hammer toes of both feet      ? Vascular disease      ? Relevant Medications  ? hydrALAZINE (APRESOLINE) 50 MG tablet  ? ?  ? ? ?-Examined patient. ?-Mechanically debrided all nails 1-5 bilateral using sterile nail nipper and filed with dremel without incident  ?-Answered all patient questions ?-Patient to return  in 3 months for continued foot care.  ?-Patient advised to call the office if any problems or questions arise in the meantime. ? ?Bronson Ing, DPM ?

## 2021-11-15 DIAGNOSIS — R600 Localized edema: Secondary | ICD-10-CM | POA: Diagnosis not present

## 2021-11-15 DIAGNOSIS — C9000 Multiple myeloma not having achieved remission: Secondary | ICD-10-CM | POA: Diagnosis not present

## 2021-11-21 DIAGNOSIS — N184 Chronic kidney disease, stage 4 (severe): Secondary | ICD-10-CM | POA: Diagnosis not present

## 2021-11-21 DIAGNOSIS — C9000 Multiple myeloma not having achieved remission: Secondary | ICD-10-CM | POA: Diagnosis not present

## 2022-01-05 ENCOUNTER — Other Ambulatory Visit: Payer: Self-pay | Admitting: Cardiovascular Disease

## 2022-03-07 ENCOUNTER — Ambulatory Visit: Payer: Medicare HMO | Admitting: Podiatry

## 2022-03-07 ENCOUNTER — Encounter: Payer: Self-pay | Admitting: Podiatry

## 2022-03-07 DIAGNOSIS — M79674 Pain in right toe(s): Secondary | ICD-10-CM

## 2022-03-07 DIAGNOSIS — D689 Coagulation defect, unspecified: Secondary | ICD-10-CM

## 2022-03-07 DIAGNOSIS — B351 Tinea unguium: Secondary | ICD-10-CM | POA: Diagnosis not present

## 2022-03-07 DIAGNOSIS — M79675 Pain in left toe(s): Secondary | ICD-10-CM | POA: Diagnosis not present

## 2022-03-07 NOTE — Progress Notes (Signed)
Subjective: Tara Potts is a 88 y.o. female patient who presents to office today complaining of long,mildly painful nails  while ambulating in shoes; unable to trim.  Patient denies any new changes in medication or new problems. Patient denies any new cramping, numbness, burning or tingling in the legs or feet. She takes Eliquis.    Patient Active Problem List   Diagnosis Date Noted   NSTEMI (non-ST elevated myocardial infarction) ([place]) 07/17/2021   Hypertensive emergency 07/16/2021   Normocytic anemia 07/16/2021   Acute lower UTI 06/21/2020   Dehydration 06/21/2020   Acute renal failure superimposed on stage 3 chronic kidney disease ([place]) 06/21/2020   Hyponatremia 06/21/2020   Gait abnormality 04/14/2020   Peripheral neuropathy 04/14/2020   CRI (chronic renal insufficiency), stage 3 (moderate) ([place]) 11/20/2019   Bilateral lower extremity edema 10/23/2019   Multiple myeloma ([place]) 05/26/2018   Goals of care, counseling/discussion 05/26/2018   Class 1 obesity 01/29/2018   S/P right THA, AA 01/28/2018   S/P hip replacement 01/28/2018   Carotid artery disease ([place]) 04/12/2017   Passed out 10/05/2015   Lumbosacral radiculopathy 09/10/2014   Cervical stenosis of spinal canal 09/10/2014   Abnormality of gait 07/27/2014   Paresthesia 07/27/2014   Bilateral leg cramps 02/21/2014   Hx of CABG 01/13/2013   Peripheral vascular disease:  Moderate right and mild left ICA stenosis. 01/13/2013   Essential hypertension 01/13/2013   Dyslipidemia, goal LDL below 70    Menopause 02/12/2011   Leiomyoma of body of uterus 02/12/2011   Post-menopausal atrophic vaginitis 02/12/2011   Urinary frequency 02/12/2011   UNSPECIFIED PERIPHERAL VASCULAR DISEASE 07/11/2010   Current Outpatient Medications on File Prior to Visit  Medication Sig Dispense Refill   apixaban (ELIQUIS) 2.5 MG TABS tablet Take 1 tablet (2.5 mg total) by mouth 2 (two) times daily. 60 tablet 3   apixaban (ELIQUIS) 2.5 MG TABS tablet  Take 1 tablet (2.5 mg total) by mouth 2 (two) times daily. 28 tablet 0   ezetimibe (ZETIA) 10 MG tablet TAKE 1 TABLET BY MOUTH EVERY DAY 90 tablet 3   hydrALAZINE (APRESOLINE) 25 MG tablet TAKE 1 TABLET (25 MG TOTAL) BY MOUTH IN THE MORNING AND AT BEDTIME. 180 tablet 1   hydrALAZINE (APRESOLINE) 50 MG tablet 1 tablet with food     isosorbide mononitrate (IMDUR) 60 MG 24 hr tablet Take 1 tablet (60 mg total) by mouth daily. 30 tablet 3   losartan (COZAAR) 100 MG tablet Take 100 mg by mouth daily.     metoprolol tartrate (LOPRESSOR) 25 MG tablet Take 1 tablet (25 mg total) by mouth 2 (two) times daily. 60 tablet 3   potassium chloride (KLOR-CON) 10 MEQ tablet TAKE 1 TABLET(10 MEQ) BY MOUTH DAILY (Patient taking differently: Take 10 mEq by mouth daily. TAKE 1 TABLET(10 MEQ) BY MOUTH DAILY) 90 tablet 3   REPATHA SURECLICK 601 MG/ML SOAJ Inject 1 mL into the skin every 30 (thirty) days.     No current facility-administered medications on file prior to visit.   Allergies  Allergen Reactions   Codeine Nausea And Vomiting    confusion   Cymbalta [Duloxetine Hcl] Nausea Only   Ezetimibe     Other reaction(s): cramping   Gabapentin     Other reaction(s): felt crazy     Objective: General: Patient is awake, alert, and oriented x 3 and in no acute distress.  Integument: Skin is warm, dry and supple bilateral. Nails are tender, long, thickened and  dystrophic with  subungual debris, consistent with onychomycosis, 1-5 bilateral. No signs of infection. No open lesions noted.  Small callus sub hallux right is present.  Remaining integument unremarkable.  Vasculature:  Dorsalis Pedis pulse 1/4 bilateral. Posterior Tibial pulse  0/4 bilateral.  Capillary fill time <3 sec 1-5 bilateral. Positive hair growth to the level of the digits. Temperature gradient within normal limits. Mild edema present bilateral.   Neurology: The patient has decreased sensation measured with a 5.07/10g Semmes Weinstein  Monofilament at 2/5 pedal sites bilateral . Vibratory sensation diminished bilateral with tuning fork. No Babinski sign present bilateral.   Musculoskeletal: No symptomatic pedal deformities noted bilateral. Contracuture of lesser digits is seen bilateral. Muscular strength 5/5 in all lower extremity muscular groups bilateral without pain on range of motion . No tenderness with calf compression bilateral.  Assessment and Plan: Problem List Items Addressed This Visit   None    -Examined patient. -Mechanically debrided all nails 1-5 bilateral using sterile nail nipper and filed with dremel without incident  -Answered all patient questions -Patient to return  in 3 months for continued foot care.  -Patient advised to call the office if any problems or questions arise in the meantime.  Lorenda Peck, DPM

## 2022-05-22 DIAGNOSIS — I10 Essential (primary) hypertension: Secondary | ICD-10-CM | POA: Diagnosis not present

## 2022-05-22 DIAGNOSIS — G4762 Sleep related leg cramps: Secondary | ICD-10-CM | POA: Diagnosis not present

## 2022-05-22 DIAGNOSIS — I25118 Atherosclerotic heart disease of native coronary artery with other forms of angina pectoris: Secondary | ICD-10-CM | POA: Diagnosis not present

## 2022-05-22 DIAGNOSIS — I480 Paroxysmal atrial fibrillation: Secondary | ICD-10-CM | POA: Diagnosis not present

## 2022-05-22 DIAGNOSIS — I872 Venous insufficiency (chronic) (peripheral): Secondary | ICD-10-CM | POA: Diagnosis not present

## 2022-05-22 DIAGNOSIS — C9000 Multiple myeloma not having achieved remission: Secondary | ICD-10-CM | POA: Diagnosis not present

## 2022-05-22 DIAGNOSIS — I739 Peripheral vascular disease, unspecified: Secondary | ICD-10-CM | POA: Diagnosis not present

## 2022-06-06 ENCOUNTER — Emergency Department (HOSPITAL_COMMUNITY)
Admission: EM | Admit: 2022-06-06 | Discharge: 2022-06-06 | Disposition: A | Discharge status: Home / Self Care | Payer: Medicare HMO | Attending: Emergency Medicine | Admitting: Emergency Medicine

## 2022-06-06 ENCOUNTER — Emergency Department (HOSPITAL_COMMUNITY): Payer: Medicare HMO

## 2022-06-06 DIAGNOSIS — S93401A Sprain of unspecified ligament of right ankle, initial encounter: Secondary | ICD-10-CM | POA: Diagnosis not present

## 2022-06-06 DIAGNOSIS — Y92522 Railway station as the place of occurrence of the external cause: Secondary | ICD-10-CM | POA: Diagnosis not present

## 2022-06-06 DIAGNOSIS — D649 Anemia, unspecified: Secondary | ICD-10-CM | POA: Insufficient documentation

## 2022-06-06 DIAGNOSIS — S060X0A Concussion without loss of consciousness, initial encounter: Secondary | ICD-10-CM | POA: Diagnosis not present

## 2022-06-06 DIAGNOSIS — Z951 Presence of aortocoronary bypass graft: Secondary | ICD-10-CM | POA: Insufficient documentation

## 2022-06-06 DIAGNOSIS — S01511A Laceration without foreign body of lip, initial encounter: Secondary | ICD-10-CM | POA: Insufficient documentation

## 2022-06-06 DIAGNOSIS — Z8579 Personal history of other malignant neoplasms of lymphoid, hematopoietic and related tissues: Secondary | ICD-10-CM | POA: Insufficient documentation

## 2022-06-06 DIAGNOSIS — S4990XA Unspecified injury of shoulder and upper arm, unspecified arm, initial encounter: Secondary | ICD-10-CM | POA: Diagnosis not present

## 2022-06-06 DIAGNOSIS — M25511 Pain in right shoulder: Secondary | ICD-10-CM | POA: Insufficient documentation

## 2022-06-06 DIAGNOSIS — M25559 Pain in unspecified hip: Secondary | ICD-10-CM | POA: Diagnosis not present

## 2022-06-06 DIAGNOSIS — Z79899 Other long term (current) drug therapy: Secondary | ICD-10-CM | POA: Diagnosis not present

## 2022-06-06 DIAGNOSIS — J9811 Atelectasis: Secondary | ICD-10-CM | POA: Diagnosis not present

## 2022-06-06 DIAGNOSIS — I2510 Atherosclerotic heart disease of native coronary artery without angina pectoris: Secondary | ICD-10-CM | POA: Insufficient documentation

## 2022-06-06 DIAGNOSIS — J45909 Unspecified asthma, uncomplicated: Secondary | ICD-10-CM | POA: Insufficient documentation

## 2022-06-06 DIAGNOSIS — I10 Essential (primary) hypertension: Secondary | ICD-10-CM | POA: Insufficient documentation

## 2022-06-06 DIAGNOSIS — Z043 Encounter for examination and observation following other accident: Secondary | ICD-10-CM | POA: Diagnosis not present

## 2022-06-06 DIAGNOSIS — Z23 Encounter for immunization: Secondary | ICD-10-CM | POA: Insufficient documentation

## 2022-06-06 DIAGNOSIS — M25552 Pain in left hip: Secondary | ICD-10-CM | POA: Diagnosis not present

## 2022-06-06 DIAGNOSIS — S161XXA Strain of muscle, fascia and tendon at neck level, initial encounter: Secondary | ICD-10-CM | POA: Insufficient documentation

## 2022-06-06 DIAGNOSIS — M4312 Spondylolisthesis, cervical region: Secondary | ICD-10-CM | POA: Diagnosis not present

## 2022-06-06 DIAGNOSIS — Z7901 Long term (current) use of anticoagulants: Secondary | ICD-10-CM | POA: Insufficient documentation

## 2022-06-06 DIAGNOSIS — S0993XA Unspecified injury of face, initial encounter: Secondary | ICD-10-CM | POA: Diagnosis not present

## 2022-06-06 DIAGNOSIS — S060XAA Concussion with loss of consciousness status unknown, initial encounter: Secondary | ICD-10-CM | POA: Insufficient documentation

## 2022-06-06 DIAGNOSIS — N289 Disorder of kidney and ureter, unspecified: Secondary | ICD-10-CM | POA: Diagnosis not present

## 2022-06-06 DIAGNOSIS — M7989 Other specified soft tissue disorders: Secondary | ICD-10-CM | POA: Diagnosis not present

## 2022-06-06 DIAGNOSIS — W100XXA Fall (on)(from) escalator, initial encounter: Secondary | ICD-10-CM | POA: Diagnosis not present

## 2022-06-06 DIAGNOSIS — M19011 Primary osteoarthritis, right shoulder: Secondary | ICD-10-CM | POA: Diagnosis not present

## 2022-06-06 DIAGNOSIS — W19XXXA Unspecified fall, initial encounter: Secondary | ICD-10-CM | POA: Diagnosis not present

## 2022-06-06 DIAGNOSIS — M25519 Pain in unspecified shoulder: Secondary | ICD-10-CM | POA: Diagnosis not present

## 2022-06-06 DIAGNOSIS — R58 Hemorrhage, not elsewhere classified: Secondary | ICD-10-CM | POA: Diagnosis not present

## 2022-06-06 DIAGNOSIS — S0990XA Unspecified injury of head, initial encounter: Secondary | ICD-10-CM | POA: Diagnosis not present

## 2022-06-06 DIAGNOSIS — Y900 Blood alcohol level of less than 20 mg/100 ml: Secondary | ICD-10-CM | POA: Insufficient documentation

## 2022-06-06 DIAGNOSIS — M25571 Pain in right ankle and joints of right foot: Secondary | ICD-10-CM | POA: Diagnosis not present

## 2022-06-06 DIAGNOSIS — S3993XA Unspecified injury of pelvis, initial encounter: Secondary | ICD-10-CM | POA: Diagnosis not present

## 2022-06-06 LAB — COMPREHENSIVE METABOLIC PANEL
ALT: 17 U/L (ref 0–44)
AST: 30 U/L (ref 15–41)
Albumin: 2.9 g/dL — ABNORMAL LOW (ref 3.5–5.0)
Alkaline Phosphatase: 61 U/L (ref 38–126)
Anion gap: 15 (ref 5–15)
BUN: 49 mg/dL — ABNORMAL HIGH (ref 8–23)
CO2: 21 mmol/L — ABNORMAL LOW (ref 22–32)
Calcium: 9 mg/dL (ref 8.9–10.3)
Chloride: 101 mmol/L (ref 98–111)
Creatinine, Ser: 4.71 mg/dL — ABNORMAL HIGH (ref 0.44–1.00)
GFR, Estimated: 9 mL/min — ABNORMAL LOW (ref >60)
Glucose, Bld: 114 mg/dL — ABNORMAL HIGH (ref 70–99)
Potassium: 4.5 mmol/L (ref 3.5–5.1)
Sodium: 137 mmol/L (ref 135–145)
Total Bilirubin: 0.5 mg/dL (ref 0.3–1.2)
Total Protein: 9.4 g/dL — ABNORMAL HIGH (ref 6.5–8.1)

## 2022-06-06 LAB — CBC
HCT: 26 % — ABNORMAL LOW (ref 36.0–46.0)
Hemoglobin: 8.2 g/dL — ABNORMAL LOW (ref 12.0–15.0)
MCH: 26.3 pg (ref 26.0–34.0)
MCHC: 31.5 g/dL (ref 30.0–36.0)
MCV: 83.3 fL (ref 80.0–100.0)
Platelets: 269 10*3/uL (ref 150–400)
RBC: 3.12 MIL/uL — ABNORMAL LOW (ref 3.87–5.11)
RDW: 18.8 % — ABNORMAL HIGH (ref 11.5–15.5)
WBC: 7.5 10*3/uL (ref 4.0–10.5)
nRBC: 0 % (ref 0.0–0.2)

## 2022-06-06 LAB — SAMPLE TO BLOOD BANK

## 2022-06-06 LAB — ETHANOL: Alcohol, Ethyl (B): <10 mg/dL (ref <10)

## 2022-06-06 LAB — PROTIME-INR
INR: 1.3 — ABNORMAL HIGH (ref 0.8–1.2)
Prothrombin Time: 15.7 seconds — ABNORMAL HIGH (ref 11.4–15.2)

## 2022-06-06 MED ORDER — TETANUS-DIPHTH-ACELL PERTUSSIS 5-2.5-18.5 LF-MCG/0.5 IM SUSY
0.5000 mL | PREFILLED_SYRINGE | Freq: Once | INTRAMUSCULAR | Status: AC
  Administered 2022-06-06: 0.5 mL via INTRAMUSCULAR
  Filled 2022-06-06: qty 0.5

## 2022-06-06 MED ORDER — HYDROCODONE-ACETAMINOPHEN 5-325 MG PO TABS
1.0000 | ORAL_TABLET | Freq: Once | ORAL | Status: AC
  Administered 2022-06-06: 1 via ORAL
  Filled 2022-06-06: qty 1

## 2022-06-06 NOTE — ED Notes (Signed)
Patient ambulatory to the bathroom with cane. Patient uses cane at baseline

## 2022-06-06 NOTE — ED Triage Notes (Signed)
Pt BIB [facility], [place], c/o bilateral shoulder pain, left hip pain and pt has a lac to upper lip. Alert to baseline.

## 2022-06-06 NOTE — ED Notes (Signed)
xraY at bedside

## 2022-06-06 NOTE — ED Notes (Signed)
Patient transported to CT 

## 2022-06-06 NOTE — Discharge Instructions (Addendum)
Your kidney function levels are worse.  Your creatinine is 4.7 and your GFR is 9.  Please tell Dr. Shelia Media about this worsening.

## 2022-06-06 NOTE — ED Provider Notes (Signed)
[facility] EMERGENCY DEPARTMENT Provider Note   CSN: [id number] Arrival date & time: 06/06/22  0253     History  Chief Complaint  Patient presents with   Fall   Level 5 caveat due to acuity of condition Tara Potts is a 88 y.o. female.  The history is provided by the patient, the EMS personnel and a relative. The history is limited by the condition of the patient.  Fall This is a new problem. Nothing relieves the symptoms.  Patient with extensive history including CAD, asthma, hypertension presents after fall.  [place] when she fell.  Daughter reports that she fell down a few steps then the escalator took her to the top Patient now reporting hip pain, right shoulder pain and she has a laceration to her lip    Past Medical History:  Diagnosis Date   Anemia    Arthritis    Asthma    as a child   CAD (coronary artery disease)    Carotid disease, bilateral ([place])    mild   Fibroid    Hx of CABG 2007   [facility]   Hyperlipemia    Hypertension    Lumbosacral radiculopathy 09/10/2014   Menopausal symptoms    Multiple myeloma ([place]) 05/26/2018   Peripheral neuropathy    Seizure-like activity ([place])     Home Medications Prior to Admission medications   Medication Sig Start Date End Date Taking? Authorizing Provider  apixaban (ELIQUIS) 2.5 MG TABS tablet Take 1 tablet (2.5 mg total) by mouth 2 (two) times daily. 07/20/21   Ghimire, Henreitta Leber, MD  apixaban (ELIQUIS) 2.5 MG TABS tablet Take 1 tablet (2.5 mg total) by mouth 2 (two) times daily. 08/16/21   Warren Lacy, PA-C  ezetimibe (ZETIA) 10 MG tablet TAKE 1 TABLET BY MOUTH EVERY DAY 11/02/21   Lorretta Harp, MD  hydrALAZINE (APRESOLINE) 25 MG tablet TAKE 1 TABLET (25 MG TOTAL) BY MOUTH IN THE MORNING AND AT BEDTIME. 01/05/22   Lorretta Harp, MD  hydrALAZINE (APRESOLINE) 50 MG tablet 1 tablet with food    [provider]  isosorbide  mononitrate (IMDUR) 60 MG 24 hr tablet Take 1 tablet (60 mg total) by mouth daily. 07/20/21   Ghimire, Henreitta Leber, MD  losartan (COZAAR) 100 MG tablet Take 100 mg by mouth daily. 05/05/21   [provider]  metoprolol tartrate (LOPRESSOR) 25 MG tablet Take 1 tablet (25 mg total) by mouth 2 (two) times daily. 07/20/21   Ghimire, Henreitta Leber, MD  potassium chloride (KLOR-CON) 10 MEQ tablet TAKE 1 TABLET(10 MEQ) BY MOUTH DAILY Patient taking differently: Take 10 mEq by mouth daily. TAKE 1 TABLET(10 MEQ) BY MOUTH DAILY 11/10/19   Lorretta Harp, MD  REPATHA SURECLICK 409 MG/ML SOAJ Inject 1 mL into the skin every 30 (thirty) days. 07/07/21   [provider]      Allergies    Codeine, Cymbalta [duloxetine hcl], Ezetimibe, and Gabapentin    Review of Systems   Review of Systems  Unable to perform ROS: Acuity of condition    Physical Exam Updated Vital Signs BP (!) 111/47   Pulse 70   Temp 97.9 F (36.6 C) (Oral)   Resp 17   Ht 1.651 m (5' 5")   Wt 85.8 kg   LMP 07/16/2002   SpO2 100%   BMI 31.48 kg/m  Physical Exam CONSTITUTIONAL: Elderly and anxious HEAD: Normocephalic/atraumatic EYES:  EOMI/PERRL ENMT: Mucous membranes moist, small laceration to upper lip.  No other obvious dental trauma NECK: supple no meningeal signs SPINE/BACK:entire spine nontender No bruising/crepitance/stepoffs noted to spine CV: S1/S2 noted, no murmurs/rubs/gallops noted LUNGS: Lungs are clear to auscultation bilaterally, no apparent distress ABDOMEN: soft, nontender GU:no cva tenderness NEURO: Pt is awake/alert moves all extremitiesx4.  No facial droop.   Patient appears confused EXTREMITIES: Tenderness with range of motion of her right shoulder.  Mild tenderness noted with range of motion of both hips with no deformities All other extremities/joints palpated/ranged and nontender SKIN: warm, color normal  ED Results / Procedures / Treatments   Labs (all labs ordered are listed, but  only abnormal results are displayed) Labs Reviewed  COMPREHENSIVE METABOLIC PANEL - Abnormal; Notable for the following components:      Result Value   CO2 21 (*)    Glucose, Bld 114 (*)    BUN 49 (*)    Creatinine, Ser 4.71 (*)    Total Protein 9.4 (*)    Albumin 2.9 (*)    GFR, Estimated 9 (*)    All other components within normal limits  CBC - Abnormal; Notable for the following components:   RBC 3.12 (*)    Hemoglobin 8.2 (*)    HCT 26.0 (*)    RDW 18.8 (*)    All other components within normal limits  PROTIME-INR - Abnormal; Notable for the following components:   Prothrombin Time 15.7 (*)    INR 1.3 (*)    All other components within normal limits  ETHANOL  SAMPLE TO BLOOD BANK    EKG EKG Interpretation  Date/Time:  Wednesday June 06 2022 03:07:57 EST Ventricular Rate:  77 PR Interval:  178 QRS Duration: 94 QT Interval:  429 QTC Calculation: 486 R Axis:   85 Text Interpretation: Sinus rhythm Borderline right axis deviation Probable left ventricular hypertrophy Borderline prolonged QT interval Confirmed by Ripley Fraise [phone]) on 06/06/2022 3:27:01 AM  Radiology DG Ankle Complete Right  Result Date: 06/06/2022 CLINICAL DATA:  88 year old female status post fall with pain. EXAM: RIGHT ANKLE - COMPLETE 3+ VIEW COMPARISON:  Right foot series today. FINDINGS: Generalized soft tissue swelling and stranding in the visible tib fib with superimposed dystrophic or vascular calcifications. Mortise joint alignment maintained. No evidence of ankle joint effusion. Talar dome appears intact. No acute fracture or dislocation identified in the distal tibia, fibula, or calcaneus. IMPRESSION: Generalized soft tissue swelling and/or edema. No acute fracture or dislocation identified about the right ankle. Electronically Signed   By: Genevie Ann M.D.   On: 06/06/2022 05:56   DG Foot Complete Right  Result Date: 06/06/2022 CLINICAL DATA:  88 year old female status post fall with  pain. EXAM: RIGHT FOOT COMPLETE - 3+ VIEW COMPARISON:  Right ankle series today reported separately. Right foot series 07/05/2014. FINDINGS: Chronic midfoot degeneration, 1st MTP degeneration with progression since 2015 including bulky dorsal midfoot degenerative spurring now. Anterior and dorsal soft tissue swelling in the foot. Calcaneus appears intact. No acute fracture or dislocation identified in the foot. Ankle is detailed separately. IMPRESSION: 1. Soft tissue swelling with chronic degeneration but no acute fracture or dislocation identified about the right foot. 2. Ankle is reported separately. Electronically Signed   By: Genevie Ann M.D.   On: 06/06/2022 05:55   CT HEAD WO CONTRAST  Result Date: 06/06/2022 CLINICAL DATA:  Fall, head and facial trauma. EXAM: CT HEAD WITHOUT CONTRAST CT MAXILLOFACIAL WITHOUT CONTRAST CT CERVICAL SPINE WITHOUT CONTRAST  TECHNIQUE: Multidetector CT imaging of the head, cervical spine, and maxillofacial structures were performed using the standard protocol without intravenous contrast. Multiplanar CT image reconstructions of the cervical spine and maxillofacial structures were also generated. RADIATION DOSE REDUCTION: This exam was performed according to the departmental dose-optimization program which includes automated exposure control, adjustment of the mA and/or kV according to patient size and/or use of iterative reconstruction technique. COMPARISON:  None. FINDINGS: CT HEAD FINDINGS Brain: No acute intracranial hemorrhage, midline shift or mass effect. No extra-axial fluid collection. Diffuse atrophy is noted. Periventricular white matter hypodensities are noted bilaterally and hypodensities are present in the pons. No hydrocephalus. Vascular: No hyperdense vessel or unexpected calcification. Skull: Normal. Negative for fracture or focal lesion. Other: None. CT MAXILLOFACIAL FINDINGS Osseous: No fracture or mandibular dislocation. No destructive process. Orbits: No  traumatic or inflammatory finding. Sinuses: Clear. Soft tissues: Negative. CT CERVICAL SPINE FINDINGS Alignment: There is mild anterolisthesis at C2-C3 and C3-C4 with kyphosis of the midcervical spine. Skull base and vertebrae: No acute fracture. There is fusion of the C4-C5 facets on the right. Soft tissues and spinal canal: No prevertebral fluid or swelling. No visible canal hematoma. Disc levels: Multilevel intervertebral disc space narrowing, uncovertebral osteophyte formation, and facet arthropathy, most pronounced at C5-C6. Upper chest: Sternotomy wires are noted. Other: Carotid artery calcifications. IMPRESSION: 1. No acute intracranial hemorrhage. 2. Atrophy with chronic microvascular ischemic changes. 3. No evidence of facial bone fracture. 4. Multilevel degenerative changes in the cervical spine without evidence of acute fracture. Electronically Signed   By: Brett Fairy M.D.   On: 06/06/2022 04:17   CT MAXILLOFACIAL WO CONTRAST  Result Date: 06/06/2022 CLINICAL DATA:  Fall, head and facial trauma. EXAM: CT HEAD WITHOUT CONTRAST CT MAXILLOFACIAL WITHOUT CONTRAST CT CERVICAL SPINE WITHOUT CONTRAST TECHNIQUE: Multidetector CT imaging of the head, cervical spine, and maxillofacial structures were performed using the standard protocol without intravenous contrast. Multiplanar CT image reconstructions of the cervical spine and maxillofacial structures were also generated. RADIATION DOSE REDUCTION: This exam was performed according to the departmental dose-optimization program which includes automated exposure control, adjustment of the mA and/or kV according to patient size and/or use of iterative reconstruction technique. COMPARISON:  None. FINDINGS: CT HEAD FINDINGS Brain: No acute intracranial hemorrhage, midline shift or mass effect. No extra-axial fluid collection. Diffuse atrophy is noted. Periventricular white matter hypodensities are noted bilaterally and hypodensities are present in the pons. No  hydrocephalus. Vascular: No hyperdense vessel or unexpected calcification. Skull: Normal. Negative for fracture or focal lesion. Other: None. CT MAXILLOFACIAL FINDINGS Osseous: No fracture or mandibular dislocation. No destructive process. Orbits: No traumatic or inflammatory finding. Sinuses: Clear. Soft tissues: Negative. CT CERVICAL SPINE FINDINGS Alignment: There is mild anterolisthesis at C2-C3 and C3-C4 with kyphosis of the midcervical spine. Skull base and vertebrae: No acute fracture. There is fusion of the C4-C5 facets on the right. Soft tissues and spinal canal: No prevertebral fluid or swelling. No visible canal hematoma. Disc levels: Multilevel intervertebral disc space narrowing, uncovertebral osteophyte formation, and facet arthropathy, most pronounced at C5-C6. Upper chest: Sternotomy wires are noted. Other: Carotid artery calcifications. IMPRESSION: 1. No acute intracranial hemorrhage. 2. Atrophy with chronic microvascular ischemic changes. 3. No evidence of facial bone fracture. 4. Multilevel degenerative changes in the cervical spine without evidence of acute fracture. Electronically Signed   By: Brett Fairy M.D.   On: 06/06/2022 04:17   CT CERVICAL SPINE WO CONTRAST  Result Date: 06/06/2022 CLINICAL DATA:  Fall, head and facial  trauma. EXAM: CT HEAD WITHOUT CONTRAST CT MAXILLOFACIAL WITHOUT CONTRAST CT CERVICAL SPINE WITHOUT CONTRAST TECHNIQUE: Multidetector CT imaging of the head, cervical spine, and maxillofacial structures were performed using the standard protocol without intravenous contrast. Multiplanar CT image reconstructions of the cervical spine and maxillofacial structures were also generated. RADIATION DOSE REDUCTION: This exam was performed according to the departmental dose-optimization program which includes automated exposure control, adjustment of the mA and/or kV according to patient size and/or use of iterative reconstruction technique. COMPARISON:  None. FINDINGS: CT  HEAD FINDINGS Brain: No acute intracranial hemorrhage, midline shift or mass effect. No extra-axial fluid collection. Diffuse atrophy is noted. Periventricular white matter hypodensities are noted bilaterally and hypodensities are present in the pons. No hydrocephalus. Vascular: No hyperdense vessel or unexpected calcification. Skull: Normal. Negative for fracture or focal lesion. Other: None. CT MAXILLOFACIAL FINDINGS Osseous: No fracture or mandibular dislocation. No destructive process. Orbits: No traumatic or inflammatory finding. Sinuses: Clear. Soft tissues: Negative. CT CERVICAL SPINE FINDINGS Alignment: There is mild anterolisthesis at C2-C3 and C3-C4 with kyphosis of the midcervical spine. Skull base and vertebrae: No acute fracture. There is fusion of the C4-C5 facets on the right. Soft tissues and spinal canal: No prevertebral fluid or swelling. No visible canal hematoma. Disc levels: Multilevel intervertebral disc space narrowing, uncovertebral osteophyte formation, and facet arthropathy, most pronounced at C5-C6. Upper chest: Sternotomy wires are noted. Other: Carotid artery calcifications. IMPRESSION: 1. No acute intracranial hemorrhage. 2. Atrophy with chronic microvascular ischemic changes. 3. No evidence of facial bone fracture. 4. Multilevel degenerative changes in the cervical spine without evidence of acute fracture. Electronically Signed   By: Brett Fairy M.D.   On: 06/06/2022 04:17   DG Shoulder Right Port  Result Date: 06/06/2022 CLINICAL DATA:  Fall. EXAM: RIGHT SHOULDER - 1 VIEW COMPARISON:  None Available. FINDINGS: There is no evidence of fracture or dislocation. Hypertrophic degenerative changes are present at the acromioclavicular joint. Mild degenerative changes are noted at the glenohumeral joint. Sternotomy wires are noted over the midline. Soft tissues are unremarkable. IMPRESSION: No acute fracture or dislocation. Electronically Signed   By: Brett Fairy M.D.   On:  06/06/2022 03:41   DG Pelvis Portable  Result Date: 06/06/2022 CLINICAL DATA:  Trauma, fall. EXAM: PORTABLE PELVIS 1-2 VIEWS COMPARISON:  01/28/2018. FINDINGS: No acute fracture or dislocation. Total hip arthroplasty changes are present on the left without evidence of hardware loosening. Degenerative changes are noted in the lower lumbar spine. IMPRESSION: Status post total hip arthroplasty on the right. No evidence of acute fracture or dislocation. Electronically Signed   By: Brett Fairy M.D.   On: 06/06/2022 03:40   DG Chest Port 1 View  Result Date: 06/06/2022 CLINICAL DATA:  Trauma, fall. EXAM: PORTABLE CHEST 1 VIEW COMPARISON:  07/16/2021. FINDINGS: Heart is enlarged and the mediastinal contour is stable. There is atherosclerotic calcification of the aorta. Mild atelectasis is present at the left lung base. No effusion or pneumothorax. Sternotomy wires are noted over the midline. No acute osseous abnormality. IMPRESSION: 1. Atelectasis at the left lung base. 2. Cardiomegaly. Electronically Signed   By: Brett Fairy M.D.   On: 06/06/2022 03:39    Procedures Procedures    Medications Ordered in ED Medications  Tdap (BOOSTRIX) injection 0.5 mL (0.5 mLs Intramuscular Given 06/06/22 0325)  HYDROcodone-acetaminophen (NORCO/VICODIN) 5-325 MG per tablet 1 tablet (1 tablet Oral Given 06/06/22 0517)    ED Course/ Medical Decision Making/ A&P Clinical Course as of 06/06/22 0648  Wed  Jun 06, 2022  0325 Patient seen on arrival after a fall.  She was initially not called out as a trauma.  However when I opened her chart it reported that she was on Eliquis.  Level 2 was initiated. [DW]  0450 BUN(!): 49 [DW]  0450 Creatinine(!): 4.71 Acute kidney injury [DW]  0450 Hemoglobin(!): 8.2 Chronic anemia [DW]  2330 I discussed with her daughter about her worsening renal function.  I am unable to see any recent labs in the past several months.  However she has been diagnosed with stage IV CKD and  reports she has been followed recently by her PCP.  It still unclear how acute this creatinine is. [DW]  [phone] Patient appears back to baseline.  She can ambulate with her cane with no new complaints.  No other signs of acute traumatic injury.  She has a very tiny laceration to her upper lip does not require sutures.  Patient is requesting discharge.  Daughter is at bedside.  I discussed at length with her about the worsening kidney function but is unclear how acute this is.  At this point I do feel she is safe for discharge but must have close follow-up with her PCP for recheck.  Daughter understands this plan. [DW]    Clinical Course User Index [DW] Ripley Fraise, MD                           Medical Decision Making Amount and/or Complexity of Data Reviewed Labs: ordered. Decision-making details documented in ED Course. Radiology: ordered.  Risk Prescription drug management.   This patient presents to the ED for concern of fall and head injury, this involves an extensive number of treatment options, and is a complaint that carries with it a high risk of complications and morbidity.  The differential diagnosis includes but is not limited to subdural hematoma, subarachnoid hemorrhage, skull fracture, facial fracture, cervical spine fracture  Comorbidities that complicate the patient evaluation: Patient's presentation is complicated by their history of hypertension    Additional history obtained: Additional history obtained from family and EMS  Records reviewed  outpatient records reviewed  Lab Tests: I Ordered, and personally interpreted labs.  The pertinent results include: Renal insufficiency, chronic anemia  Imaging Studies ordered: I ordered imaging studies including CT scan head C-spine and face and X-ray chest, pelvis and right shoulder   I independently visualized and interpreted imaging which showed no acute findings I agree with the radiologist  interpretation   Medicines ordered and prescription drug management: I ordered medication including Vicodin for pain Reevaluation of the patient after these medicines showed that the patient    improved   Reevaluation: After the interventions noted above, I reevaluated the patient and found that they have :improved  Complexity of problems addressed: Patient's presentation is most consistent with  acute presentation with potential threat to life or bodily function  Disposition: After consideration of the diagnostic results and the patient's response to treatment,  I feel that the patent would benefit from discharge   .         Final Clinical Impression(s) / ED Diagnoses Final diagnoses:  Concussion with unknown loss of consciousness status, initial encounter  Sprain of right ankle, unspecified ligament, initial encounter  Strain of neck muscle, initial encounter  Lip laceration, initial encounter  Renal insufficiency  Fall on or from escalator, initial encounter    Rx / DC Orders ED Discharge Orders  None         Ripley Fraise, MD 06/06/22 [phone]

## 2022-06-20 ENCOUNTER — Ambulatory Visit: Payer: Medicare HMO | Admitting: Podiatry

## 2022-06-20 ENCOUNTER — Encounter: Payer: Self-pay | Admitting: Podiatry

## 2022-06-20 VITALS — BP 161/59

## 2022-06-20 DIAGNOSIS — E559 Vitamin D deficiency, unspecified: Secondary | ICD-10-CM | POA: Insufficient documentation

## 2022-06-20 DIAGNOSIS — M79674 Pain in right toe(s): Secondary | ICD-10-CM

## 2022-06-20 DIAGNOSIS — Q828 Other specified congenital malformations of skin: Secondary | ICD-10-CM | POA: Diagnosis not present

## 2022-06-20 DIAGNOSIS — D689 Coagulation defect, unspecified: Secondary | ICD-10-CM

## 2022-06-20 DIAGNOSIS — L84 Corns and callosities: Secondary | ICD-10-CM

## 2022-06-20 DIAGNOSIS — B351 Tinea unguium: Secondary | ICD-10-CM | POA: Diagnosis not present

## 2022-06-20 DIAGNOSIS — M79675 Pain in left toe(s): Secondary | ICD-10-CM

## 2022-06-20 DIAGNOSIS — L821 Other seborrheic keratosis: Secondary | ICD-10-CM | POA: Insufficient documentation

## 2022-06-20 DIAGNOSIS — M5431 Sciatica, right side: Secondary | ICD-10-CM | POA: Insufficient documentation

## 2022-06-20 DIAGNOSIS — I872 Venous insufficiency (chronic) (peripheral): Secondary | ICD-10-CM | POA: Insufficient documentation

## 2022-06-20 DIAGNOSIS — R5381 Other malaise: Secondary | ICD-10-CM | POA: Insufficient documentation

## 2022-06-20 DIAGNOSIS — M792 Neuralgia and neuritis, unspecified: Secondary | ICD-10-CM | POA: Insufficient documentation

## 2022-06-20 DIAGNOSIS — E7800 Pure hypercholesterolemia, unspecified: Secondary | ICD-10-CM | POA: Insufficient documentation

## 2022-06-20 NOTE — Progress Notes (Signed)
  Subjective:  Patient ID: Tara Potts, female    DOB: January 01, 1934,  MRN: [id number]  Tara Potts presents to clinic today for at risk foot care. Patient has h/o PAD and painful elongated mycotic toenails 1-5 bilaterally which are tender when wearing enclosed shoe gear. Pain is relieved with periodic professional debridement.  Chief Complaint  Patient presents with   Nail Problem    Routine foot care PCP-[facility] PCP [facility]- 3 Weeks ago   New problem(s): None.   PCP is [name], MD.  Allergies  Allergen Reactions   Codeine Nausea And Vomiting    confusion   Cymbalta [Duloxetine Hcl] Nausea Only   Ezetimibe     Other reaction(s): cramping   Gabapentin     Other reaction(s): felt crazy    Review of Systems: Negative except as noted in the HPI.  Objective: No changes noted in today's physical examination. Vitals:   06/20/22 1435  BP: (!) 161/59   Tara Potts is a pleasant 88 y.o. female in NAD. AAO x 3.  Vascular Examination: CFT <3 seconds b/l. DP pulses faintly palpable b/l. PT pulses nonpalpable b/l. Digital hair absent. Skin temperature gradient warm to warm b/l. No pain with calf compression. No ischemia or gangrene. No cyanosis or clubbing noted b/l. Nonpitting edema noted BLE.   Neurological Examination: Protective sensation decreased with 10 gram monofilament b/l. Vibratory sensation diminished b/l.  Dermatological Examination: Pedal skin warm and supple b/l. No open wounds b/l. No interdigital macerations. Toenails 1-5 b/l thick, discolored, elongated with subungual debris and pain on dorsal palpation.  Hyperkeratotic lesion(s) distal tip of left 3rd toe and distal tip of left 4th toe.  No erythema, no edema, no drainage, no fluctuance. Porokeratotic lesion(s) L hallux. No erythema, no edema, no drainage, no fluctuance.  Musculoskeletal Examination: Muscle strength 5/5 to b/l LE. Hammertoe deformity noted 2-5 b/l.  Radiographs: None  Assessment/Plan: 1. Pain  due to onychomycosis of toenails of both feet   2. Porokeratosis   3. Corns   4. Coagulation defect ([place])     No orders of the defined types were placed in this encounter.   -Patient's family member present. All questions/concerns addressed on today's visit. -Examined patient. -Patient to continue soft, supportive shoe gear daily. -Toenails 1-5 b/l were debrided in length and girth with sterile nail nippers and dremel without iatrogenic bleeding.  -Corn(s) L 3rd toe and L 4th toe pared utilizing sterile scalpel blade without complication or incident. Total number debrided=2. -Porokeratotic lesion(s) left great toe pared and enucleated with sterile currette without incident. Total number of lesions debrided=1. -Patient/POA to call should there be question/concern in the interim.   Return in about 3 months (around 09/19/2022).  Marzetta Board, DPM

## 2022-07-02 ENCOUNTER — Encounter (HOSPITAL_COMMUNITY): Payer: Self-pay

## 2022-07-02 ENCOUNTER — Emergency Department (HOSPITAL_COMMUNITY): Payer: Medicare HMO

## 2022-07-02 ENCOUNTER — Inpatient Hospital Stay (HOSPITAL_COMMUNITY)
Admission: EM | Admit: 2022-07-02 | Discharge: 2022-07-06 | DRG: 683 | Disposition: A | Discharge status: Hospice | Payer: Medicare HMO | Attending: Internal Medicine | Admitting: Internal Medicine

## 2022-07-02 ENCOUNTER — Other Ambulatory Visit: Payer: Self-pay

## 2022-07-02 DIAGNOSIS — I10 Essential (primary) hypertension: Secondary | ICD-10-CM | POA: Diagnosis present

## 2022-07-02 DIAGNOSIS — Z96641 Presence of right artificial hip joint: Secondary | ICD-10-CM | POA: Diagnosis present

## 2022-07-02 DIAGNOSIS — I480 Paroxysmal atrial fibrillation: Secondary | ICD-10-CM | POA: Diagnosis present

## 2022-07-02 DIAGNOSIS — Z515 Encounter for palliative care: Secondary | ICD-10-CM | POA: Diagnosis not present

## 2022-07-02 DIAGNOSIS — Z803 Family history of malignant neoplasm of breast: Secondary | ICD-10-CM | POA: Diagnosis not present

## 2022-07-02 DIAGNOSIS — Z885 Allergy status to narcotic agent status: Secondary | ICD-10-CM

## 2022-07-02 DIAGNOSIS — E785 Hyperlipidemia, unspecified: Secondary | ICD-10-CM | POA: Diagnosis present

## 2022-07-02 DIAGNOSIS — Z833 Family history of diabetes mellitus: Secondary | ICD-10-CM | POA: Diagnosis not present

## 2022-07-02 DIAGNOSIS — I5032 Chronic diastolic (congestive) heart failure: Secondary | ICD-10-CM | POA: Diagnosis present

## 2022-07-02 DIAGNOSIS — I130 Hypertensive heart and chronic kidney disease with heart failure and stage 1 through stage 4 chronic kidney disease, or unspecified chronic kidney disease: Secondary | ICD-10-CM | POA: Diagnosis present

## 2022-07-02 DIAGNOSIS — N1832 Chronic kidney disease, stage 3b: Secondary | ICD-10-CM | POA: Diagnosis present

## 2022-07-02 DIAGNOSIS — N179 Acute kidney failure, unspecified: Principal | ICD-10-CM | POA: Diagnosis present

## 2022-07-02 DIAGNOSIS — D62 Acute posthemorrhagic anemia: Secondary | ICD-10-CM | POA: Diagnosis present

## 2022-07-02 DIAGNOSIS — R627 Adult failure to thrive: Secondary | ICD-10-CM | POA: Diagnosis not present

## 2022-07-02 DIAGNOSIS — Z66 Do not resuscitate: Secondary | ICD-10-CM | POA: Diagnosis present

## 2022-07-02 DIAGNOSIS — D649 Anemia, unspecified: Secondary | ICD-10-CM | POA: Diagnosis present

## 2022-07-02 DIAGNOSIS — Z8249 Family history of ischemic heart disease and other diseases of the circulatory system: Secondary | ICD-10-CM | POA: Diagnosis not present

## 2022-07-02 DIAGNOSIS — Z79899 Other long term (current) drug therapy: Secondary | ICD-10-CM | POA: Diagnosis not present

## 2022-07-02 DIAGNOSIS — N186 End stage renal disease: Secondary | ICD-10-CM | POA: Diagnosis not present

## 2022-07-02 DIAGNOSIS — Z87891 Personal history of nicotine dependence: Secondary | ICD-10-CM | POA: Diagnosis not present

## 2022-07-02 DIAGNOSIS — C9000 Multiple myeloma not having achieved remission: Secondary | ICD-10-CM | POA: Diagnosis present

## 2022-07-02 DIAGNOSIS — G629 Polyneuropathy, unspecified: Secondary | ICD-10-CM | POA: Diagnosis present

## 2022-07-02 DIAGNOSIS — I2510 Atherosclerotic heart disease of native coronary artery without angina pectoris: Secondary | ICD-10-CM | POA: Diagnosis present

## 2022-07-02 DIAGNOSIS — Z951 Presence of aortocoronary bypass graft: Secondary | ICD-10-CM

## 2022-07-02 DIAGNOSIS — D630 Anemia in neoplastic disease: Secondary | ICD-10-CM | POA: Diagnosis present

## 2022-07-02 DIAGNOSIS — R531 Weakness: Secondary | ICD-10-CM | POA: Diagnosis not present

## 2022-07-02 DIAGNOSIS — I739 Peripheral vascular disease, unspecified: Secondary | ICD-10-CM | POA: Diagnosis not present

## 2022-07-02 DIAGNOSIS — I5030 Unspecified diastolic (congestive) heart failure: Secondary | ICD-10-CM | POA: Diagnosis not present

## 2022-07-02 DIAGNOSIS — E871 Hypo-osmolality and hyponatremia: Secondary | ICD-10-CM | POA: Diagnosis present

## 2022-07-02 DIAGNOSIS — N250 Renal osteodystrophy: Secondary | ICD-10-CM | POA: Diagnosis not present

## 2022-07-02 DIAGNOSIS — Z888 Allergy status to other drugs, medicaments and biological substances status: Secondary | ICD-10-CM

## 2022-07-02 DIAGNOSIS — E872 Acidosis, unspecified: Secondary | ICD-10-CM | POA: Diagnosis present

## 2022-07-02 DIAGNOSIS — Z7401 Bed confinement status: Secondary | ICD-10-CM | POA: Diagnosis not present

## 2022-07-02 DIAGNOSIS — N178 Other acute kidney failure: Secondary | ICD-10-CM | POA: Diagnosis not present

## 2022-07-02 DIAGNOSIS — Z7901 Long term (current) use of anticoagulants: Secondary | ICD-10-CM

## 2022-07-02 DIAGNOSIS — Z7189 Other specified counseling: Secondary | ICD-10-CM | POA: Diagnosis not present

## 2022-07-02 DIAGNOSIS — R8281 Pyuria: Secondary | ICD-10-CM | POA: Diagnosis present

## 2022-07-02 DIAGNOSIS — R000 Tachycardia, unspecified: Secondary | ICD-10-CM | POA: Diagnosis not present

## 2022-07-02 LAB — CBC WITH DIFFERENTIAL/PLATELET
Abs Immature Granulocytes: 0.04 10*3/uL (ref 0.00–0.07)
Basophils Absolute: 0 10*3/uL (ref 0.0–0.1)
Basophils Relative: 0 %
Eosinophils Absolute: 0.3 10*3/uL (ref 0.0–0.5)
Eosinophils Relative: 4 %
HCT: 24.2 % — ABNORMAL LOW (ref 36.0–46.0)
Hemoglobin: 7.7 g/dL — ABNORMAL LOW (ref 12.0–15.0)
Immature Granulocytes: 1 %
Lymphocytes Relative: 31 %
Lymphs Abs: 2.1 10*3/uL (ref 0.7–4.0)
MCH: 26.7 pg (ref 26.0–34.0)
MCHC: 31.8 g/dL (ref 30.0–36.0)
MCV: 84 fL (ref 80.0–100.0)
Monocytes Absolute: 0.6 10*3/uL (ref 0.1–1.0)
Monocytes Relative: 9 %
Neutro Abs: 3.7 10*3/uL (ref 1.7–7.7)
Neutrophils Relative %: 55 %
Platelets: 254 10*3/uL (ref 150–400)
RBC: 2.88 MIL/uL — ABNORMAL LOW (ref 3.87–5.11)
RDW: 19.2 % — ABNORMAL HIGH (ref 11.5–15.5)
WBC: 6.7 10*3/uL (ref 4.0–10.5)
nRBC: 0 % (ref 0.0–0.2)

## 2022-07-02 LAB — COMPREHENSIVE METABOLIC PANEL
ALT: 13 U/L (ref 0–44)
AST: 21 U/L (ref 15–41)
Albumin: 2.9 g/dL — ABNORMAL LOW (ref 3.5–5.0)
Alkaline Phosphatase: 55 U/L (ref 38–126)
Anion gap: 16 — ABNORMAL HIGH (ref 5–15)
BUN: 89 mg/dL — ABNORMAL HIGH (ref 8–23)
CO2: 19 mmol/L — ABNORMAL LOW (ref 22–32)
Calcium: 9.2 mg/dL (ref 8.9–10.3)
Chloride: 98 mmol/L (ref 98–111)
Creatinine, Ser: 12.47 mg/dL — ABNORMAL HIGH (ref 0.44–1.00)
GFR, Estimated: 3 mL/min — ABNORMAL LOW (ref >60)
Glucose, Bld: 99 mg/dL (ref 70–99)
Potassium: 4.9 mmol/L (ref 3.5–5.1)
Sodium: 133 mmol/L — ABNORMAL LOW (ref 135–145)
Total Bilirubin: 0.7 mg/dL (ref 0.3–1.2)
Total Protein: 9.9 g/dL — ABNORMAL HIGH (ref 6.5–8.1)

## 2022-07-02 LAB — CK: Total CK: 74 U/L (ref 38–234)

## 2022-07-02 LAB — CBG MONITORING, ED: Glucose-Capillary: 84 mg/dL (ref 70–99)

## 2022-07-02 NOTE — ED Provider Triage Note (Signed)
Emergency Medicine Provider Triage Evaluation Note  Tara Potts , a 88 y.o. female  was evaluated in triage.  Pt complains of being sent by PCP for abnormal labs. GFR from PCP measured at 3 today with creatinine above 10. Patient does not previously have known CKD. Daughter states that she has been weak and less active since experiencing a mechanical fall in late November  Review of Systems  Positive: As above Negative: As above  Physical Exam  BP (!) 181/61 (BP Location: Right Arm)   Pulse 63   Temp 97.9 F (36.6 C) (Oral)   Resp 18   Ht '5\' 5"'$  (1.651 m)   Wt 73 kg   LMP 07/16/2002   SpO2 100%   BMI 26.79 kg/m  Gen:   Awake, no distress, slow to respond Resp:  Normal effort  MSK:   Moves extremities without difficulty Other:  Increased skin turgor  Medical Decision Making  Medically screening exam initiated at 6:40 PM.  Appropriate orders placed.  Tara Potts was informed that the remainder of the evaluation will be completed by another provider, this initial triage assessment does not replace that evaluation, and the importance of remaining in the ED until their evaluation is complete.     Luvenia Heller, PA-C 07/02/22 1845

## 2022-07-02 NOTE — ED Triage Notes (Signed)
Pt to ED via pov with daughter. Daughter states that pt fell the week before Thanksgiving and everything has been declining. Pt's daughter states that she had scheduled a doctor visit today due to mental changes, pt not eating and not moving around as much. Pt denies pain. Pt sent to ED by PCP d/t labs, pt BUN=88, creatnine = 12.63.

## 2022-07-03 ENCOUNTER — Encounter (HOSPITAL_COMMUNITY): Payer: Self-pay | Admitting: Internal Medicine

## 2022-07-03 DIAGNOSIS — Z87891 Personal history of nicotine dependence: Secondary | ICD-10-CM | POA: Diagnosis not present

## 2022-07-03 DIAGNOSIS — I5032 Chronic diastolic (congestive) heart failure: Secondary | ICD-10-CM | POA: Diagnosis not present

## 2022-07-03 DIAGNOSIS — Z515 Encounter for palliative care: Secondary | ICD-10-CM | POA: Diagnosis not present

## 2022-07-03 DIAGNOSIS — N178 Other acute kidney failure: Secondary | ICD-10-CM | POA: Diagnosis not present

## 2022-07-03 DIAGNOSIS — Z888 Allergy status to other drugs, medicaments and biological substances status: Secondary | ICD-10-CM | POA: Diagnosis not present

## 2022-07-03 DIAGNOSIS — I10 Essential (primary) hypertension: Secondary | ICD-10-CM | POA: Diagnosis not present

## 2022-07-03 DIAGNOSIS — Z803 Family history of malignant neoplasm of breast: Secondary | ICD-10-CM | POA: Diagnosis not present

## 2022-07-03 DIAGNOSIS — Z951 Presence of aortocoronary bypass graft: Secondary | ICD-10-CM | POA: Diagnosis not present

## 2022-07-03 DIAGNOSIS — C9000 Multiple myeloma not having achieved remission: Secondary | ICD-10-CM | POA: Diagnosis present

## 2022-07-03 DIAGNOSIS — I480 Paroxysmal atrial fibrillation: Secondary | ICD-10-CM

## 2022-07-03 DIAGNOSIS — E871 Hypo-osmolality and hyponatremia: Secondary | ICD-10-CM | POA: Diagnosis not present

## 2022-07-03 DIAGNOSIS — I130 Hypertensive heart and chronic kidney disease with heart failure and stage 1 through stage 4 chronic kidney disease, or unspecified chronic kidney disease: Secondary | ICD-10-CM | POA: Diagnosis present

## 2022-07-03 DIAGNOSIS — Z8249 Family history of ischemic heart disease and other diseases of the circulatory system: Secondary | ICD-10-CM | POA: Diagnosis not present

## 2022-07-03 DIAGNOSIS — Z96641 Presence of right artificial hip joint: Secondary | ICD-10-CM | POA: Diagnosis present

## 2022-07-03 DIAGNOSIS — I5030 Unspecified diastolic (congestive) heart failure: Secondary | ICD-10-CM | POA: Diagnosis not present

## 2022-07-03 DIAGNOSIS — N250 Renal osteodystrophy: Secondary | ICD-10-CM | POA: Diagnosis not present

## 2022-07-03 DIAGNOSIS — E785 Hyperlipidemia, unspecified: Secondary | ICD-10-CM | POA: Diagnosis present

## 2022-07-03 DIAGNOSIS — D62 Acute posthemorrhagic anemia: Secondary | ICD-10-CM | POA: Diagnosis present

## 2022-07-03 DIAGNOSIS — Z833 Family history of diabetes mellitus: Secondary | ICD-10-CM | POA: Diagnosis not present

## 2022-07-03 DIAGNOSIS — G629 Polyneuropathy, unspecified: Secondary | ICD-10-CM | POA: Diagnosis present

## 2022-07-03 DIAGNOSIS — Z7189 Other specified counseling: Secondary | ICD-10-CM | POA: Diagnosis not present

## 2022-07-03 DIAGNOSIS — Z885 Allergy status to narcotic agent status: Secondary | ICD-10-CM | POA: Diagnosis not present

## 2022-07-03 DIAGNOSIS — D649 Anemia, unspecified: Secondary | ICD-10-CM

## 2022-07-03 DIAGNOSIS — N179 Acute kidney failure, unspecified: Principal | ICD-10-CM

## 2022-07-03 DIAGNOSIS — Z79899 Other long term (current) drug therapy: Secondary | ICD-10-CM | POA: Diagnosis not present

## 2022-07-03 DIAGNOSIS — N1832 Chronic kidney disease, stage 3b: Secondary | ICD-10-CM

## 2022-07-03 DIAGNOSIS — E872 Acidosis, unspecified: Secondary | ICD-10-CM | POA: Diagnosis present

## 2022-07-03 DIAGNOSIS — I2510 Atherosclerotic heart disease of native coronary artery without angina pectoris: Secondary | ICD-10-CM | POA: Diagnosis present

## 2022-07-03 DIAGNOSIS — R531 Weakness: Secondary | ICD-10-CM | POA: Diagnosis not present

## 2022-07-03 DIAGNOSIS — Z66 Do not resuscitate: Secondary | ICD-10-CM | POA: Diagnosis present

## 2022-07-03 DIAGNOSIS — R8281 Pyuria: Secondary | ICD-10-CM | POA: Diagnosis present

## 2022-07-03 DIAGNOSIS — D630 Anemia in neoplastic disease: Secondary | ICD-10-CM | POA: Diagnosis present

## 2022-07-03 DIAGNOSIS — Z7401 Bed confinement status: Secondary | ICD-10-CM | POA: Diagnosis not present

## 2022-07-03 LAB — IRON AND TIBC
Iron: 85 ug/dL (ref 28–170)
Saturation Ratios: 37 % — ABNORMAL HIGH (ref 10.4–31.8)
TIBC: 232 ug/dL — ABNORMAL LOW (ref 250–450)
UIBC: 147 ug/dL

## 2022-07-03 LAB — CBC WITH DIFFERENTIAL/PLATELET
Abs Immature Granulocytes: 0.03 10*3/uL (ref 0.00–0.07)
Basophils Absolute: 0 10*3/uL (ref 0.0–0.1)
Basophils Relative: 1 %
Eosinophils Absolute: 0.3 10*3/uL (ref 0.0–0.5)
Eosinophils Relative: 4 %
HCT: 22 % — ABNORMAL LOW (ref 36.0–46.0)
Hemoglobin: 7.3 g/dL — ABNORMAL LOW (ref 12.0–15.0)
Immature Granulocytes: 1 %
Lymphocytes Relative: 28 %
Lymphs Abs: 1.7 10*3/uL (ref 0.7–4.0)
MCH: 27 pg (ref 26.0–34.0)
MCHC: 33.2 g/dL (ref 30.0–36.0)
MCV: 81.5 fL (ref 80.0–100.0)
Monocytes Absolute: 0.7 10*3/uL (ref 0.1–1.0)
Monocytes Relative: 11 %
Neutro Abs: 3.4 10*3/uL (ref 1.7–7.7)
Neutrophils Relative %: 55 %
Platelets: 216 10*3/uL (ref 150–400)
RBC: 2.7 MIL/uL — ABNORMAL LOW (ref 3.87–5.11)
RDW: 19.4 % — ABNORMAL HIGH (ref 11.5–15.5)
WBC: 6.1 10*3/uL (ref 4.0–10.5)
nRBC: 0 % (ref 0.0–0.2)

## 2022-07-03 LAB — URINALYSIS, ROUTINE W REFLEX MICROSCOPIC
Bilirubin Urine: NEGATIVE
Glucose, UA: NEGATIVE mg/dL
Hgb urine dipstick: NEGATIVE
Ketones, ur: NEGATIVE mg/dL
Nitrite: NEGATIVE
Protein, ur: 100 mg/dL — AB
Specific Gravity, Urine: 1.01 (ref 1.005–1.030)
pH: 5 (ref 5.0–8.0)

## 2022-07-03 LAB — FERRITIN: Ferritin: 213 ng/mL (ref 11–307)

## 2022-07-03 LAB — COMPREHENSIVE METABOLIC PANEL
ALT: 12 U/L (ref 0–44)
AST: 19 U/L (ref 15–41)
Albumin: 2.6 g/dL — ABNORMAL LOW (ref 3.5–5.0)
Alkaline Phosphatase: 50 U/L (ref 38–126)
Anion gap: 15 (ref 5–15)
BUN: 89 mg/dL — ABNORMAL HIGH (ref 8–23)
CO2: 18 mmol/L — ABNORMAL LOW (ref 22–32)
Calcium: 8.6 mg/dL — ABNORMAL LOW (ref 8.9–10.3)
Chloride: 99 mmol/L (ref 98–111)
Creatinine, Ser: 12.41 mg/dL — ABNORMAL HIGH (ref 0.44–1.00)
GFR, Estimated: 3 mL/min — ABNORMAL LOW (ref >60)
Glucose, Bld: 109 mg/dL — ABNORMAL HIGH (ref 70–99)
Potassium: 4.3 mmol/L (ref 3.5–5.1)
Sodium: 132 mmol/L — ABNORMAL LOW (ref 135–145)
Total Bilirubin: 1 mg/dL (ref 0.3–1.2)
Total Protein: 8.9 g/dL — ABNORMAL HIGH (ref 6.5–8.1)

## 2022-07-03 LAB — TSH: TSH: 2.515 u[IU]/mL (ref 0.350–4.500)

## 2022-07-03 LAB — RETICULOCYTES
Immature Retic Fract: 12.2 % (ref 2.3–15.9)
RBC.: 2.7 MIL/uL — ABNORMAL LOW (ref 3.87–5.11)
Retic Count, Absolute: 22.1 10*3/uL (ref 19.0–186.0)
Retic Ct Pct: 0.8 % (ref 0.4–3.1)

## 2022-07-03 LAB — MAGNESIUM: Magnesium: 2.2 mg/dL (ref 1.7–2.4)

## 2022-07-03 LAB — URIC ACID: Uric Acid, Serum: 10.5 mg/dL — ABNORMAL HIGH (ref 2.5–7.1)

## 2022-07-03 LAB — PROTEIN / CREATININE RATIO, URINE
Creatinine, Urine: 91 mg/dL
Protein Creatinine Ratio: 1.88 mg/mg{Cre} — ABNORMAL HIGH (ref 0.00–0.15)
Total Protein, Urine: 171 mg/dL

## 2022-07-03 LAB — SODIUM, URINE, RANDOM: Sodium, Ur: 75 mmol/L

## 2022-07-03 LAB — OSMOLALITY, URINE: Osmolality, Ur: 333 mOsm/kg (ref 300–900)

## 2022-07-03 LAB — PHOSPHORUS: Phosphorus: 6.1 mg/dL — ABNORMAL HIGH (ref 2.5–4.6)

## 2022-07-03 LAB — CREATININE, URINE, RANDOM: Creatinine, Urine: 90 mg/dL

## 2022-07-03 MED ORDER — HALOPERIDOL LACTATE 5 MG/ML IJ SOLN: 2.0000 mg | Freq: Four times a day (QID) | INTRAMUSCULAR | Status: DC | PRN

## 2022-07-03 MED ORDER — SODIUM CHLORIDE 0.9 % IV BOLUS
1000.0000 mL | Freq: Once | INTRAVENOUS | Status: AC
  Administered 2022-07-03: 1000 mL via INTRAVENOUS

## 2022-07-03 MED ORDER — ACETAMINOPHEN 650 MG RE SUPP: 650.0000 mg | Freq: Four times a day (QID) | RECTAL | Status: DC | PRN

## 2022-07-03 MED ORDER — APIXABAN 2.5 MG PO TABS
2.5000 mg | ORAL_TABLET | Freq: Two times a day (BID) | ORAL | Status: DC
  Administered 2022-07-03 – 2022-07-06 (×7): 2.5 mg via ORAL
  Filled 2022-07-03 (×9): qty 1

## 2022-07-03 MED ORDER — ACETAMINOPHEN 325 MG PO TABS: 650.0000 mg | ORAL_TABLET | Freq: Four times a day (QID) | ORAL | Status: DC | PRN

## 2022-07-03 MED ORDER — SODIUM CHLORIDE 0.9 % IV SOLN: INTRAVENOUS | Status: DC

## 2022-07-03 MED ORDER — LORAZEPAM 2 MG/ML IJ SOLN
0.5000 mg | Freq: Four times a day (QID) | INTRAMUSCULAR | Status: DC | PRN
  Administered 2022-07-03: 0.5 mg via INTRAVENOUS
  Filled 2022-07-03: qty 1

## 2022-07-03 MED ORDER — LACTATED RINGERS IV SOLN: INTRAVENOUS | Status: AC

## 2022-07-03 MED ORDER — MELATONIN 3 MG PO TABS: 3.0000 mg | ORAL_TABLET | Freq: Every evening | ORAL | Status: DC | PRN

## 2022-07-03 MED ORDER — HYDRALAZINE HCL 25 MG PO TABS
25.0000 mg | ORAL_TABLET | Freq: Two times a day (BID) | ORAL | Status: DC
  Administered 2022-07-03 – 2022-07-06 (×7): 25 mg via ORAL
  Filled 2022-07-03 (×7): qty 1

## 2022-07-03 NOTE — ED Notes (Signed)
Pt c/o increased anxiety and palpitations. EKG taken on pt. MD paged.

## 2022-07-03 NOTE — H&P (Signed)
History and Physical      Tara Potts TKP:[id number] DOB: 10-26-1933 DOA: 07/02/2022  PCP: [name], MD  Patient coming from: home   I have personally briefly reviewed patient's old medical records in [place]  Chief Complaint: Elevated serum creatinine identified via outpatient labs  HPI: Tara Potts is a 88 y.o. female with medical history significant for CKD 3B with baseline creatinine 1.51.8, multiple myeloma, chronic normocytic anemia with baseline hemoglobin 8-9, essential hypertension, paroxysmal atrial fibrillation chronically anticoagulated on Eliquis, chronic diastolic heart failure who is admitted to [facility] on 07/02/2022 with acute renal failure superimposed on CKD 3B after presenting from home to [facility] emergency department for evaluation of interval elevation in serum creatinine identified on outpatient labs.  The following history was provided by the patient, the patient's daughter who was present at bedside, in addition to my discussions with the EDP and via chart review.  The patient had presented to [facility] emergency department on 06/06/2022 for further evaluation of a ground-level mechanical fall without loss of consciousness incurred on that date.  Imaging evaluation was reportedly unremarkable, without any evidence of acute fractures.  However, laboratory evaluation performed in the ED that day noted interval increase in serum creatinine from her baseline range of 1.51.8, with most recent priors serum creatinine data point of 1.6 in March 2023, with labs performed on 06/04/2022 showing interval increase in serum creatinine to 4.6.  She was discharged home from the ED with instructions to follow-up with her PCP, who subsequently ordered repeat labs as outpatient, which showed further insulin elevation of serum creatinine up to 12, prompting PCP to recommend to the patient that she present to the emergency department for further evaluation and  management thereof.  Aside from noting her recent decrease in appetite resulting in decline in oral intake, the patient conveys no recent new symptoms, and conveys that she is otherwise in her normal state of health.  She specifically denies any recent dysuria or gross hematuria.  She continues to produce urine, potentially with slightly lower volume when prompted to evaluate this.  Denies any urinary urgency acutely.  She also denies any recent chest pain, shortness of breath, palpitations, diaphoresis, dizziness, orthopnea, PND, and no recent edema in the lower extremities.  No rash.  Denies any recent subjective fever, chills, rigors or generalized myalgias.  No recent cough.   She conveys that sometimes over the last several months that she developed some swelling in the bilateral lower extremities, prompting her PCP to order Lasix for he otherwise, she denies anyr.  Recent changes to her medication regimen, which includes losartan.   Pressure review, she has documentation of a history of multiple myeloma as well as a history of anemia with baseline hemoglobin 8-9, with most recent prior hemoglobin 8.2 on 06/07/2022.  Medical history also notable for documentation of paroxysmal atrial fibrillation for which he is currently anticoagulated on Eliquis as well as history of chronic diastolic heart failure, with most recent echocardiogram in January 2023 showing LVEF 55 to 6% as well as grade 2 diastolic dysfunction.  She also has a history of central hypertension, denies any known history of underlying diabetes.     ED Course:  Vital signs in the ED were notable for the following: Afebrile; heart rates in the 60s to 70s; slight blood pressures in the 160s to 170s; respiratory rate 14-18, oxygen saturation 100% on room air.  Labs were notable for the following: CMP notable  for sodium 133 compared to most recent prior value of 137 on 06/06/2022, potassium 4.9, bicarbonate 19, anion gap 16, BUN 89,  creatinine 12.47, glucose 99, calcium, adjusted for mild hypoalbuminemia noted to be 10.0, albumin 2.9, otherwise liver enzymes within normal limits, including alkaline phosphatase.  Total protein 9.9.  CBC notable for white cell count of 6700, hemoglobin 7.7 associated with normocytic/normochromic findings.  Urinalysis notable for 21-50 white blood cells, rare bacteria, nitrate negative, no red blood cells and 100 protein.  Per my interpretation, EKG in ED demonstrated the following: EKG shows sinus rhythm with heart rate 65, normal intervals, nonspecific T wave inversion in V5, V6, and no evidence of ST changes, including no evidence of ST elevation.  Imaging and additional notable ED work-up: 2 view chest x-ray, per final radiology read shows no evidence of acute cardiopulmonary process, including no evidence of infiltrate, edema, effusion, or pneumothorax.  Renal ultrasound showed echogenic kidneys bilaterally consistent with medical renal disease, without evidence of hydronephrosis.  While in the ED, the following were administered: Normal saline as well as 1 L bolus.  Subsequently, the patient was admitted for further evaluation and management of acute renal failure superimposed on CKD 3B with labs also notable for acute hyponatremia as well as asymptomatic pyuria.     Review of Systems: As per HPI otherwise 10 point review of systems negative.   Past Medical History:  Diagnosis Date   Anemia    Arthritis    Asthma    as a child   CAD (coronary artery disease)    Carotid disease, bilateral ([place])    mild   Fibroid    Hx of CABG 2007   [facility]   Hyperlipemia    Hypertension    Lumbosacral radiculopathy 09/10/2014   Menopausal symptoms    Multiple myeloma ([place]) 05/26/2018   Peripheral neuropathy    Seizure-like activity (HCC)     Past Surgical History:  Procedure Laterality Date   CARDIAC CATHETERIZATION  01/09/2008   diffusely diseased graft to the PDA & diffuse PDA  disease   CAROTID DOPPLER  08/27/2011   mod. right & nild left ICA stenosis   CATARACT SURGERY     CORONARY ARTERY BYPASS GRAFT     FOOT SURGERY     JOINT REPLACEMENT     Right hip Dr. Alvan Dame 01-28-18    LEFT HEART CATH AND CORS/GRAFTS ANGIOGRAPHY N/A 07/18/2021   Procedure: LEFT HEART CATH AND CORS/GRAFTS ANGIOGRAPHY;  Surgeon: Belva Crome, MD;  Location: [place] [facility];  Service: Cardiovascular;  Laterality: N/A;   MASS REMOVED FROM CERVIX  12/1997   Benign endocervical inclusion cysts   NM MYOVIEW LTD  01/12/2011   no ischemia   TOTAL HIP ARTHROPLASTY Right 01/28/2018   Procedure: RIGHT TOTAL HIP ARTHROPLASTY ANTERIOR APPROACH;  Surgeon: Paralee Cancel, MD;  Location: [facility];  Service: Orthopedics;  Laterality: Right;  70 mins   TRIPLE HEART BYPASS  05/2006   [facility]   US ECHOCARDIOGRAPHY  10/30/2007   mild MA,MR,TR,AOV mildly sclerotic,density oted in the prox asc aorta    Social History:  reports that she has quit smoking. She has never used smokeless tobacco. She reports that she does not drink alcohol and does not use drugs.   Allergies  Allergen Reactions   Codeine Nausea And Vomiting    confusion   Cymbalta [Duloxetine Hcl] Nausea Only   Ezetimibe     Other reaction(s): cramping   Gabapentin  Other reaction(s): felt crazy    Family History  Problem Relation Age of Onset   Diabetes Mother    Hypertension Mother    Heart disease Mother    Heart failure Mother    Hypertension Sister    Breast cancer Maternal Aunt        Age 60   Heart failure Maternal Aunt    Heart failure Maternal Uncle     Family history reviewed and not pertinent    Prior to Admission medications   Medication Sig Start Date End Date Taking? Authorizing Provider  apixaban (ELIQUIS) 2.5 MG TABS tablet Take 1 tablet (2.5 mg total) by mouth 2 (two) times daily. 07/20/21   Ghimire, Henreitta Leber, MD  apixaban (ELIQUIS) 2.5 MG TABS tablet Take 1 tablet (2.5 mg total) by mouth 2 (two)  times daily. 08/16/21   Warren Lacy, PA-C  ezetimibe (ZETIA) 10 MG tablet TAKE 1 TABLET BY MOUTH EVERY DAY 11/02/21   Lorretta Harp, MD  hydrALAZINE (APRESOLINE) 25 MG tablet TAKE 1 TABLET (25 MG TOTAL) BY MOUTH IN THE MORNING AND AT BEDTIME. 01/05/22   Lorretta Harp, MD  hydrALAZINE (APRESOLINE) 50 MG tablet 1 tablet with food    [provider]  isosorbide mononitrate (IMDUR) 60 MG 24 hr tablet Take 1 tablet (60 mg total) by mouth daily. 07/20/21   Ghimire, Henreitta Leber, MD  losartan (COZAAR) 100 MG tablet Take 100 mg by mouth daily. 05/05/21   [provider]  metoprolol tartrate (LOPRESSOR) 25 MG tablet Take 1 tablet (25 mg total) by mouth 2 (two) times daily. 07/20/21   Ghimire, Henreitta Leber, MD  potassium chloride (KLOR-CON) 10 MEQ tablet TAKE 1 TABLET(10 MEQ) BY MOUTH DAILY Patient taking differently: Take 10 mEq by mouth daily. TAKE 1 TABLET(10 MEQ) BY MOUTH DAILY 11/10/19   Lorretta Harp, MD  REPATHA SURECLICK 416 MG/ML SOAJ Inject 1 mL into the skin every 30 (thirty) days. 07/07/21   [provider]     Objective    Physical Exam: Vitals:   07/02/22 1832 07/02/22 1836 07/03/22 0126 07/03/22 0200  BP:  (!) 181/61 (!) 170/75 (!) 177/66  Pulse:  63 66 (!) 28  Resp:  18 (!) 24 (!) 22  Temp:  97.9 F (36.6 C) 97.6 F (36.4 C)   TempSrc:  Oral Oral   SpO2:  100% 100% 100%  Weight: 73 kg     Height: _0  (1.651 m)       General: appears to be stated age; alert, oriented Skin: warm, dry, no rash Head:  AT/[place] Mouth:  Oral mucosa membranes appear dry, normal dentition Neck: supple; trachea midline Heart:  RRR; did not appreciate any M/R/G Lungs: CTAB, did not appreciate any wheezes, rales, or rhonchi Abdomen: + BS; soft, ND, NT Vascular: 2+ pedal pulses b/l; 2+ radial pulses b/l Extremities: no peripheral edema, no muscle wasting Neuro: strength and sensation intact in upper and lower extremities b/l    Labs on Admission: I have personally  reviewed following labs and imaging studies  CBC: Recent Labs  Lab 07/02/22 1852  WBC 6.7  NEUTROABS 3.7  HGB 7.7*  HCT 24.2*  MCV 84.0  PLT 606   Basic Metabolic Panel: Recent Labs  Lab 07/02/22 1852  NA 133*  K 4.9  CL 98  CO2 19*  GLUCOSE 99  BUN 89*  CREATININE 12.47*  CALCIUM 9.2   GFR: Estimated Creatinine Clearance: 3.2 mL/min (A) (by C-G formula based  on SCr of 12.47 mg/dL (H)). Liver Function Tests: Recent Labs  Lab 07/02/22 1852  AST 21  ALT 13  ALKPHOS 55  BILITOT 0.7  PROT 9.9*  ALBUMIN 2.9*   No results for input(s): "LIPASE", "AMYLASE" in the last 168 hours. No results for input(s): "AMMONIA" in the last 168 hours. Coagulation Profile: No results for input(s): "INR", "PROTIME" in the last 168 hours. Cardiac Enzymes: Recent Labs  Lab 07/02/22 1852  CKTOTAL 74   BNP (last 3 results) No results for input(s): "PROBNP" in the last 8760 hours. HbA1C: No results for input(s): "HGBA1C" in the last 72 hours. CBG: Recent Labs  Lab 07/02/22 1851  GLUCAP 84   Lipid Profile: No results for input(s): "CHOL", "HDL", "LDLCALC", "TRIG", "CHOLHDL", "LDLDIRECT" in the last 72 hours. Thyroid Function Tests: No results for input(s): "TSH", "T4TOTAL", "FREET4", "T3FREE", "THYROIDAB" in the last 72 hours. Anemia Panel: No results for input(s): "VITAMINB12", "FOLATE", "FERRITIN", "TIBC", "IRON", "RETICCTPCT" in the last 72 hours. Urine analysis:    Component Value Date/Time   COLORURINE YELLOW 07/02/2022 1841   APPEARANCEUR HAZY (A) 07/02/2022 1841   LABSPEC 1.010 07/02/2022 1841   PHURINE 5.0 07/02/2022 1841   GLUCOSEU NEGATIVE 07/02/2022 1841   HGBUR NEGATIVE 07/02/2022 1841   BILIRUBINUR NEGATIVE 07/02/2022 1841   KETONESUR NEGATIVE 07/02/2022 1841   PROTEINUR 100 (A) 07/02/2022 1841   UROBILINOGEN 0.2 03/03/2014 1146   NITRITE NEGATIVE 07/02/2022 1841   LEUKOCYTESUR SMALL (A) 07/02/2022 1841    Radiological Exams on Admission: DG Chest 2  View  Result Date: 07/02/2022 CLINICAL DATA:  Weakness EXAM: CHEST - 2 VIEW COMPARISON:  06/06/2022 FINDINGS: Post sternotomy changes. Cardiomegaly. No acute airspace disease, pleural effusion or pneumothorax. Aortic atherosclerosis. IMPRESSION: No active cardiopulmonary disease. Cardiomegaly. Electronically Signed   By: Donavan Foil M.D.   On: 07/02/2022 19:56   US Renal  Result Date: 07/02/2022 CLINICAL DATA:  Weakness end-stage renal disease EXAM: RENAL / URINARY TRACT ULTRASOUND COMPLETE COMPARISON:  08/22/2018 FINDINGS: Right Kidney: Renal measurements: 9 x 4.3 x 4.7 cm = volume: 96 mL. Echogenic cortex. No mass or hydronephrosis. Left Kidney: Renal measurements: 9.1 x 4.4 x 4.2 cm = volume: 89 mL. Echogenic cortex. No mass or hydronephrosis. Bladder: Appears normal for degree of bladder distention. Other: None. IMPRESSION: Echogenic kidneys consistent with medical renal disease. No hydronephrosis. Electronically Signed   By: Donavan Foil M.D.   On: 07/02/2022 19:56      Assessment/Plan   Principal Problem:   Acute renal failure superimposed on stage 3b chronic kidney disease (HCC) Active Problems:   Essential hypertension   Acute hyponatremia   Chronic anemia   Pyuria, sterile   Chronic diastolic CHF (congestive heart failure) (HCC)   Paroxysmal atrial fibrillation ([place])     #) Acute renal failure superimposed on CKD 3B:  as quantified above.  Progressive increase in creatinine over the last few weeks relative to the last time she was noted to be at her baseline, with creatinine of 1.6 in March 2023.  Presenting potassium 4.9.  No evidence of acute volume overload, with clear chest x-ray and no evidence of acute respiratory distress, with the patient denying any shortness of breath and maintaining oxygen saturations of 100% on room air, including when lying supine.  She may have some early evidence of uremia, given recent report of decline in appetite.  She also has very mild  elevated anion gap metabolic acidosis.  Overall, there is not appear to be an indication for  urgent or emergent hemodialysis at this time.   Differential includes potential progression of documented history of multiple myeloma.  There may also be a prerenal component given evidence of intravascular depletion, concomitant with report of patient's decline in oral intake over the course the last several weeks, with reported of Lasix as most recently introduced medication, as further detailed above.  Urinalysis with microscopy shows 21-50 white blood cells no RBCs, while showing 100 protein.  Consequently, we will check random urine protein to creatinine ratio.  Renal ultrasound showed no evidence of postrenal obstructive source, as for the detailed above.  potential pharmacologic exacerbating component posed by home losartan.  Will begin to expand laboratory evaluation, will provide very gentle IV fluids overnight, with close trending of instituting renal function/response to these gentle IV fluids.   Plan: monitor strict I's & O's and daily weights. Attempt to avoid nephrotoxic agents.  Hold home losartan and Lasix.  Refrain from NSAIDs. Repeat CMP in the morning. Check serum magnesium level and check serum phosphorus level. Add-on random urine sodium and random urine creatinine.  Check random urine protein to creatinine ratio.  Check hemoglobin A1c.  Continue secondary Erycette cervices paradigms 8 hours.  Hold home scheduled potassium chloride.  Hold on Lasix.            #) Acute hypoosmolar hypovolemic hyponatremia: Presenting serum sodium 133 compared to 137 on 06/04/2022.  As noted above, there is some clinical evidence of mild dehydration in the setting of diminished oral intake over the last few weeks, although her presenting acute hyponatremia may also be on the basis of interval worsening renal function.  No evidence of acute volume overload at this time, as further detailed above.  No  overt pharmacologic contributions.  No evidence of acute focal neurologic deficits.  Plan: Gentle IV fluids, as above.  Monitor strict I's and O's and daily weights.  Check random urine sodium, random urine creatinine, random urine osmolality, serum osmolality, TSH, uric acid level, INR.  Repeat CMP in the morning.           #) Asymptomatic pyuria: Present urinalysis with microscopic shows 21-50 white blood cells.  However, the patient denies any acute urinary symptoms.  She does not appear septic, and appears hemodynamically stable.  Suspect that this pyuria may be more related to her presenting acute renal failure.  Overall, presentation and these laboratory results appear most consistent with asymptomatic pyuria.  Consequently, we will refrain from initiation of antibiotics at this time.  Plan: Repeat CBC in the morning.  Refraining from antibiotics, as above.           #) Chronic normocytic anemia: Documented history of such, with baseline hemoglobin 8-9, with presenting hemoglobin of 7.7 consistent with his baseline range.  No evidence of active bleed at this time.  Patient's history of multiple myeloma is noted.  Plan: Add on iron studies.  Check INR.  Type and screen ordered.  Repeat CBC in the morning.             #) Essential Hypertension: documented h/o such, with outpatient antihypertensive regimen including hydralazine, metoprolol tartrate, and Imdur.  SBP's in the ED today: 160s to 170s mmHg.   Plan: Close monitoring of subsequent BP via routine VS. will resume home hydralazine.  Close monitoring and seeing renal function in the setting of acute renal failure and CKD 3B, as above.              #) Paroxysmal atrial fibrillation:  Per chart review, documented history of such. In setting of CHA2DS2-VASc score of greater than 2 by virtue of her age and gender alone,, there is an indication for chronic anticoagulation for thromboembolic prophylaxis.  Consistent with this, patient is chronically anticoagulated on Eliquis. Home AV nodal blocking regimen: Metoprolol tartrate.  Most recent echocardiogram occurred in January 2023, as further detailed above. Presenting EKG appears to demonstrate sinus rhythm without overt evidence of acute ischemic changes.  Plan: monitor strict I's & O's and daily weights. CMP/CBC in AM. Check serum mag level. Continue home Eliquis.  Monitor on symmetry.             #) Chronic diastolic heart failure: documented history of such, with most recent echocardiogram performed in January 2023 notable for LVEF 55 to 60% as well as grade 2 diastolic dysfunction. No clinical or radiographic evidence to suggest acutely decompensated heart failure at this time, although she is at risk for Rensing development of such as a consequence of her presenting acute renal failure. . home diuretic regimen, per patient, consists of Lasix.  Will closely monitor for ensuing evidence of acute volume overload given plan for gentle IV fluids overnight as component of management of presenting acute renal failure.   Plan: monitor strict I's & O's and daily weights. Repeat CMP in AM. Check serum mag level.  Hold home Lasix for now.        DVT prophylaxis: SCD's plus home Eliquis Code Status: Full code Disposition Plan: Per Rounding Team Consults called: none ;  Admission status: Inpatient     I SPENT GREATER THAN 75  MINUTES IN CLINICAL CARE TIME/MEDICAL DECISION-MAKING IN COMPLETING THIS ADMISSION.      [place] DO [facility]  From [place]   07/03/2022, 3:05 AM

## 2022-07-03 NOTE — Consult Note (Signed)
Reason for Consult: Renal failure Referring Physician:  Dr. Babs Bertin  Chief Complaint:  Elevated creatinine found from outpatient labs  Assessment/Plan: Acute on CKD3b w/ baseline creatinine in the 1.5-1.8 range but that was in Jan-Mar 2023 with increase to 4.71 06/06/22 and then 12 at f/u with primary care provider office. No e/o obstruction on ultrasound but she has had myeloma diagnosed in 2021 which has never been treated. Already with a  M-spike UPEP of 39% 20m and a positive elevation in free Kappa light chains in 06/22/2020. Suspicious for myeloma kidney with possibly superimposed amyloid light chain deposition as well. - FLC, SPEP have been sent which I anticipate to be positive given no treatment. - Recommend having hematology see the patient has modulating therapies such as Bortezomib or CTX + dexamethasone will need to be given concurrently with TPE. - No absolute indication for renal replacement therapy but certainly may need to be initiated during this hospitalization. I counseled and educated the patient and the 2 daughters who were bedside about the possible need for TPE as well as dialysis. Patient only wants to leave but the daughters understand the need to stay for diagnosis and definitive treatment. - Hold the Losartan and Lasix for now; would be very judicious with fluids as she is already dyspneic with very minimal exertion. Would not give any more than 1L isotonic fluids.  Daughter to bring a list of herbal remedies as well.  Additional recommendations - Dose all meds for creatinine clearance < 10 ml/min  - Unless absolutely necessary, no MRIs with gadolinium.  -Maintain MAP>65 for optimal renal perfusion.  - Avoid nephrotoxic medications including NSAIDs and iodinated intravenous contrast exposure unless the latter is absolutely indicated.   - Preferred narcotic agents for pain control are hydromorphone, fentanyl, and methadone. Morphine should not be used.   -  Continue strict Input and Output monitoring. Will monitor the patient closely with you and intervene or adjust therapy as indicated by changes in clinical status/labs   Pyuria with no fevers, dysuria - could this be AIN? Will need a biopsy to confirm and steroids may then be beneficial to shorten the duration. Daughter has been giving her unknown herbal remedies for some time now. Anemia - worse than her baseline and have to wonder if the myeloma has continued to progress. Hypertension - may restart outpt regimen but hold the Losartan given renal failure.    HPI: Tara BITTINGERis an 88y.o. female multiple myeloma, chronic anemia (baseline 8-9), hypertension, pafib on Eliquis, dCHF, CKD3b baseline creatinine 1.5-1.8 from Jan-March 2023 but in 06/06/22 the creatinine was 4.71 and now presenting with a creatinine in the 12 range. Patient was recently in the ED for a ground level fall end of November 2023 at which time there was an increase in Cr to the 4' range but unfortunately on follow up with the PCP repeat labs showed an increase in Cr to 12. Patient has had worsening appetitie but denies nausea, vomiting. She has noted slightly decreased volume but denies any obstructive like symptoms, dysuria, hematuria. She also denies and shortness of breath but according the the daughters the patient does get short of breath especially with any exertion, even walking to the bathroom. She denies chest pain, fever, chills; she also denies any cough, sore throat, exposure to sick contacts. She had lower extremity swelling with PCP ordering Lasix which she has been on for years (479mdaily) as well as Losartan for hypertension. Of note she is  on multiple herbal remedies through one of the daughters but the daughter does not remember what they are. When the patient was diagnosed with myeloma in 2021 she was not started on any therapy as the daughter was suspicious thinking the diagnosis was made prior to the return of any  results from tests. But the patient has had a bone marrow biopsy in the past.  Hb was found to be 7.3 with a WBC of 6.1 and platelets 216.  Urinalysis was negative for RBC's , 21-50 WBC's with proteinuria 181m/dl, UPC 1.88. TP was 9.9. CXR shows no infiltrates or congestion. Renal ultrasound showed echogenic kidneys but no obstruction.   Back in 06/22/2020 patient with a M-spike UPEP of 39% 764mand a positive elevation in free Kappa light chains.   ROS Pertinent items are noted in HPI.  Chemistry and CBC: Creatinine  Date/Time Value Ref Range Status  07/11/2020 03:00 PM 1.98 (H) 0.44 - 1.00 mg/dL Final  08/25/2018 11:37 AM 1.49 (H) 0.44 - 1.00 mg/dL Final  05/06/2018 12:45 PM 1.39 (H) 0.44 - 1.00 mg/dL Final  07/03/2017 09:28 AM 1.5 (H) 0.6 - 1.1 mg/dL Final  06/17/2017 12:34 PM 1.4 (H) 0.6 - 1.1 mg/dL Final   Creat  Date/Time Value Ref Range Status  02/17/2014 10:46 AM 1.07 0.50 - 1.10 mg/dL Final   Creatinine, Ser  Date/Time Value Ref Range Status  07/03/2022 03:39 AM 12.41 (H) 0.44 - 1.00 mg/dL Final  07/02/2022 06:52 PM 12.47 (H) 0.44 - 1.00 mg/dL Final  06/06/2022 03:20 AM 4.71 (H) 0.44 - 1.00 mg/dL Final  09/13/2021 02:11 PM 1.68 (H) 0.57 - 1.00 mg/dL Final  07/19/2021 02:54 AM 1.79 (H) 0.44 - 1.00 mg/dL Final  07/18/2021 08:06 PM 1.72 (H) 0.44 - 1.00 mg/dL Final  07/18/2021 01:05 AM 1.61 (H) 0.44 - 1.00 mg/dL Final  07/17/2021 08:58 AM 1.51 (H) 0.44 - 1.00 mg/dL Final  07/16/2021 10:09 PM 1.55 (H) 0.44 - 1.00 mg/dL Final  07/16/2021 10:18 AM 1.52 (H) 0.44 - 1.00 mg/dL Final  08/19/2020 10:46 AM 1.70 (H) 0.57 - 1.00 mg/dL Final  06/29/2020 04:20 PM 2.25 (H) 0.57 - 1.00 mg/dL Final  06/24/2020 07:14 AM 1.40 (H) 0.44 - 1.00 mg/dL Final  06/23/2020 12:19 PM 1.66 (H) 0.44 - 1.00 mg/dL Final  06/22/2020 05:00 AM 2.25 (H) 0.44 - 1.00 mg/dL Final  06/21/2020 07:58 PM 2.82 (H) 0.44 - 1.00 mg/dL Final  11/20/2019 11:29 AM 1.52 (H) 0.57 - 1.00 mg/dL Final  05/21/2019 12:00 PM  1.38 (H) 0.57 - 1.00 mg/dL Final  01/29/2018 05:54 AM 1.89 (H) 0.44 - 1.00 mg/dL Final  01/20/2018 11:18 AM 1.55 (H) 0.44 - 1.00 mg/dL Final  02/22/2017 03:00 PM 1.20 (H) 0.44 - 1.00 mg/dL Final   Recent Labs  Lab 07/02/22 1852 07/03/22 0339  NA 133* 132*  K 4.9 4.3  CL 98 99  CO2 19* 18*  GLUCOSE 99 109*  BUN 89* 89*  CREATININE 12.47* 12.41*  CALCIUM 9.2 8.6*  PHOS  --  6.1*   Recent Labs  Lab 07/02/22 1852 07/03/22 0339  WBC 6.7 6.1  NEUTROABS 3.7 3.4  HGB 7.7* 7.3*  HCT 24.2* 22.0*  MCV 84.0 81.5  PLT 254 216   Liver Function Tests: Recent Labs  Lab 07/02/22 1852 07/03/22 0339  AST 21 19  ALT 13 12  ALKPHOS 55 50  BILITOT 0.7 1.0  PROT 9.9* 8.9*  ALBUMIN 2.9* 2.6*   No results for input(s): "LIPASE", "AMYLASE" in the last  168 hours. No results for input(s): "AMMONIA" in the last 168 hours. Cardiac Enzymes: Recent Labs  Lab 07/02/22 1852  CKTOTAL 74   Iron Studies:  Recent Labs    07/03/22 0339  FERRITIN 213   PT/INR: _0 (inr:5)  Xrays/Other Studies: ) Results for orders placed or performed during the hospital encounter of 07/02/22 (from the past 48 hour(s))  Urinalysis, Routine w reflex microscopic Urine, Clean Catch     Status: Abnormal   Collection Time: 07/02/22  6:41 PM  Result Value Ref Range   Color, Urine YELLOW YELLOW   APPearance HAZY (A) CLEAR   Specific Gravity, Urine 1.010 1.005 - 1.030   pH 5.0 5.0 - 8.0   Glucose, UA NEGATIVE NEGATIVE mg/dL   Hgb urine dipstick NEGATIVE NEGATIVE   Bilirubin Urine NEGATIVE NEGATIVE   Ketones, ur NEGATIVE NEGATIVE mg/dL   Protein, ur 100 (A) NEGATIVE mg/dL   Nitrite NEGATIVE NEGATIVE   Leukocytes,Ua SMALL (A) NEGATIVE   RBC / HPF 0-5 0 - 5 RBC/hpf   WBC, UA 21-50 0 - 5 WBC/hpf   Bacteria, UA RARE (A) NONE SEEN   Squamous Epithelial / LPF 0-5 0 - 5    Comment: Performed at [facility], [address] [place], [place] [address]  Protein / creatinine ratio, urine     Status:  Abnormal   Collection Time: 07/02/22  6:41 PM  Result Value Ref Range   Creatinine, Urine 91 mg/dL   Total Protein, Urine 171 mg/dL    Comment: RESULT CONFIRMED BY MANUAL DILUTION NO NORMAL RANGE ESTABLISHED FOR THIS TEST    Protein Creatinine Ratio 1.88 (H) 0.00 - 0.15 mg/mg[Cre]    Comment: Performed at [facility] [address], [place], [place] [address]  Creatinine, urine, random     Status: None   Collection Time: 07/02/22  6:41 PM  Result Value Ref Range   Creatinine, Urine 90 mg/dL    Comment: Performed at [facility], [place] [address] [place], [place] [address]  Sodium, urine, random     Status: None   Collection Time: 07/02/22  6:41 PM  Result Value Ref Range   Sodium, Ur 75 mmol/L    Comment: Performed at [facility] [address], [place], [place] [address]  POC CBG, ED     Status: None   Collection Time: 07/02/22  6:51 PM  Result Value Ref Range   Glucose-Capillary 84 70 - 99 mg/dL    Comment: Glucose reference range applies only to samples taken after fasting for at least 8 hours.  CBC with Differential     Status: Abnormal   Collection Time: 07/02/22  6:52 PM  Result Value Ref Range   WBC 6.7 4.0 - 10.5 K/uL   RBC 2.88 (L) 3.87 - 5.11 MIL/uL   Hemoglobin 7.7 (L) 12.0 - 15.0 g/dL   HCT 24.2 (L) 36.0 - 46.0 %   MCV 84.0 80.0 - 100.0 fL   MCH 26.7 26.0 - 34.0 pg   MCHC 31.8 30.0 - 36.0 g/dL   RDW 19.2 (H) 11.5 - 15.5 %   Platelets 254 150 - 400 K/uL   nRBC 0.0 0.0 - 0.2 %   Neutrophils Relative % 55 %   Neutro Abs 3.7 1.7 - 7.7 K/uL   Lymphocytes Relative 31 %   Lymphs Abs 2.1 0.7 - 4.0 K/uL   Monocytes Relative 9 %   Monocytes Absolute 0.6 0.1 - 1.0 K/uL  Eosinophils Relative 4 %   Eosinophils Absolute 0.3 0.0 - 0.5 K/uL   Basophils Relative 0 %   Basophils Absolute 0.0 0.0 - 0.1 K/uL   Immature Granulocytes 1 %   Abs Immature Granulocytes 0.04 0.00 - 0.07 K/uL    Comment: Performed at [facility] [address],  [place], [place] [address]  Comprehensive metabolic panel     Status: Abnormal   Collection Time: 07/02/22  6:52 PM  Result Value Ref Range   Sodium 133 (L) 135 - 145 mmol/L   Potassium 4.9 3.5 - 5.1 mmol/L   Chloride 98 98 - 111 mmol/L   CO2 19 (L) 22 - 32 mmol/L   Glucose, Bld 99 70 - 99 mg/dL    Comment: Glucose reference range applies only to samples taken after fasting for at least 8 hours.   BUN 89 (H) 8 - 23 mg/dL   Creatinine, Ser 12.47 (H) 0.44 - 1.00 mg/dL   Calcium 9.2 8.9 - 10.3 mg/dL   Total Protein 9.9 (H) 6.5 - 8.1 g/dL   Albumin 2.9 (L) 3.5 - 5.0 g/dL   AST 21 15 - 41 U/L   ALT 13 0 - 44 U/L   Alkaline Phosphatase 55 38 - 126 U/L   Total Bilirubin 0.7 0.3 - 1.2 mg/dL   GFR, Estimated 3 (L) >60 mL/min    Comment: (NOTE) Calculated using the CKD-EPI Creatinine Equation (2021)    Anion gap 16 (H) 5 - 15    Comment: Performed at [facility], [place] [address], [place], [place] [address]  CK     Status: None   Collection Time: 07/02/22  6:52 PM  Result Value Ref Range   Total CK 74 38 - 234 U/L    Comment: Performed at [facility], [place] [address] [place], [place] [address]  CBC with Differential/Platelet     Status: Abnormal   Collection Time: 07/03/22  3:39 AM  Result Value Ref Range   WBC 6.1 4.0 - 10.5 K/uL   RBC 2.70 (L) 3.87 - 5.11 MIL/uL   Hemoglobin 7.3 (L) 12.0 - 15.0 g/dL   HCT 22.0 (L) 36.0 - 46.0 %   MCV 81.5 80.0 - 100.0 fL   MCH 27.0 26.0 - 34.0 pg   MCHC 33.2 30.0 - 36.0 g/dL   RDW 19.4 (H) 11.5 - 15.5 %   Platelets 216 150 - 400 K/uL   nRBC 0.0 0.0 - 0.2 %   Neutrophils Relative % 55 %   Neutro Abs 3.4 1.7 - 7.7 K/uL   Lymphocytes Relative 28 %   Lymphs Abs 1.7 0.7 - 4.0 K/uL   Monocytes Relative 11 %   Monocytes Absolute 0.7 0.1 - 1.0 K/uL   Eosinophils Relative 4 %   Eosinophils Absolute 0.3 0.0 - 0.5 K/uL   Basophils Relative 1 %   Basophils Absolute 0.0 0.0 - 0.1 K/uL   Immature Granulocytes 1 %   Abs Immature Granulocytes 0.03  0.00 - 0.07 K/uL    Comment: Performed at [facility] [address], [place],  [address]  Comprehensive metabolic panel     Status: Abnormal   Collection Time: 07/03/22  3:39 AM  Result Value Ref Range   Sodium 132 (L) 135 - 145 mmol/L   Potassium 4.3 3.5 - 5.1 mmol/L   Chloride 99 98 - 111 mmol/L   CO2 18 (L) 22 - 32 mmol/L   Glucose, Bld 109 (  H) 70 - 99 mg/dL    Comment: Glucose reference range applies only to samples taken after fasting for at least 8 hours.   BUN 89 (H) 8 - 23 mg/dL   Creatinine, Ser 12.41 (H) 0.44 - 1.00 mg/dL   Calcium 8.6 (L) 8.9 - 10.3 mg/dL   Total Protein 8.9 (H) 6.5 - 8.1 g/dL   Albumin 2.6 (L) 3.5 - 5.0 g/dL   AST 19 15 - 41 U/L   ALT 12 0 - 44 U/L   Alkaline Phosphatase 50 38 - 126 U/L   Total Bilirubin 1.0 0.3 - 1.2 mg/dL   GFR, Estimated 3 (L) >60 mL/min    Comment: (NOTE) Calculated using the CKD-EPI Creatinine Equation (2021)    Anion gap 15 5 - 15    Comment: Performed at [facility] [address], [place], [place] [address]  Magnesium     Status: None   Collection Time: 07/03/22  3:39 AM  Result Value Ref Range   Magnesium 2.2 1.7 - 2.4 mg/dL    Comment: Performed at [facility] [address], [place], [place] [address]  Phosphorus     Status: Abnormal   Collection Time: 07/03/22  3:39 AM  Result Value Ref Range   Phosphorus 6.1 (H) 2.5 - 4.6 mg/dL    Comment: Performed at [facility] [address], [place], [place] [address]  TSH     Status: None   Collection Time: 07/03/22  3:39 AM  Result Value Ref Range   TSH 2.515 0.350 - 4.500 uIU/mL    Comment: Performed by a 3rd Generation assay with a functional sensitivity of <=0.01 uIU/mL. Performed at [facility], [place] [address] [place], [place] [address]   Reticulocytes     Status: Abnormal   Collection Time: 07/03/22  3:39 AM  Result Value Ref Range   Retic Ct Pct 0.8 0.4 - 3.1 %   RBC. 2.70 (L) 3.87 - 5.11 MIL/uL   Retic Count,  Absolute 22.1 19.0 - 186.0 K/uL   Immature Retic Fract 12.2 2.3 - 15.9 %    Comment: Performed at [facility] [address] [place], [place] [address]  Ferritin     Status: None   Collection Time: 07/03/22  3:39 AM  Result Value Ref Range   Ferritin 213 11 - 307 ng/mL    Comment: Performed at [facility], [place] [address] [place], [place] [address]  Uric acid     Status: Abnormal   Collection Time: 07/03/22  3:39 AM  Result Value Ref Range   Uric Acid, Serum 10.5 (H) 2.5 - 7.1 mg/dL    Comment: Performed at [facility] [address] [place], [place] [address]   DG Chest 2 View  Result Date: 07/02/2022 CLINICAL DATA:  Weakness EXAM: CHEST - 2 VIEW COMPARISON:  06/06/2022 FINDINGS: Post sternotomy changes. Cardiomegaly. No acute airspace disease, pleural effusion or pneumothorax. Aortic atherosclerosis. IMPRESSION: No active cardiopulmonary disease. Cardiomegaly. Electronically Signed   By: Donavan Foil M.D.   On: 07/02/2022 19:56   US Renal  Result Date: 07/02/2022 CLINICAL DATA:  Weakness end-stage renal disease EXAM: RENAL / URINARY TRACT ULTRASOUND COMPLETE COMPARISON:  08/22/2018 FINDINGS: Right Kidney: Renal measurements: 9 x 4.3 x 4.7 cm = volume: 96 mL. Echogenic cortex. No mass or hydronephrosis. Left Kidney: Renal measurements: 9.1 x 4.4 x 4.2 cm = volume: 89 mL. Echogenic cortex. No mass or  hydronephrosis. Bladder: Appears normal for degree of bladder distention. Other: None. IMPRESSION: Echogenic kidneys consistent with medical renal disease. No hydronephrosis. Electronically Signed   By: Donavan Foil M.D.   On: 07/02/2022 19:56    PMH:   Past Medical History:  Diagnosis Date   Anemia    Arthritis    Asthma    as a child   CAD (coronary artery disease)    Carotid disease, bilateral ([facility])    mild   Fibroid    Hx of CABG 2007   [facility]   Hyperlipemia    Hypertension    Lumbosacral radiculopathy 09/10/2014   Menopausal symptoms     Multiple myeloma ([facility]) 05/26/2018   Peripheral neuropathy    Seizure-like activity ([facility])     PSH:   Past Surgical History:  Procedure Laterality Date   CARDIAC CATHETERIZATION  01/09/2008   diffusely diseased graft to the PDA & diffuse PDA disease   CAROTID DOPPLER  08/27/2011   mod. right & nild left ICA stenosis   CATARACT SURGERY     CORONARY ARTERY BYPASS GRAFT     FOOT SURGERY     JOINT REPLACEMENT     Right hip Dr. Alvan Dame 01-28-18    LEFT HEART CATH AND CORS/GRAFTS ANGIOGRAPHY N/A 07/18/2021   Procedure: LEFT HEART CATH AND CORS/GRAFTS ANGIOGRAPHY;  Surgeon: Belva Crome, MD;  Location: [facility];  Service: Cardiovascular;  Laterality: N/A;   MASS REMOVED FROM CERVIX  12/1997   Benign endocervical inclusion cysts   NM MYOVIEW LTD  01/12/2011   no ischemia   TOTAL HIP ARTHROPLASTY Right 01/28/2018   Procedure: RIGHT TOTAL HIP ARTHROPLASTY ANTERIOR APPROACH;  Surgeon: Paralee Cancel, MD;  Location: [facility];  Service: Orthopedics;  Laterality: Right;  70 mins   TRIPLE HEART BYPASS  05/2006   [facility]   US ECHOCARDIOGRAPHY  10/30/2007   mild MA,MR,TR,AOV mildly sclerotic,density oted in the prox asc aorta    Allergies:  Allergies  Allergen Reactions   Codeine Nausea And Vomiting    confusion   Cymbalta [Duloxetine Hcl] Nausea Only   Ezetimibe     Other reaction(s): cramping   Gabapentin     Other reaction(s): felt crazy    Medications:   Prior to Admission medications   Medication Sig Start Date End Date Taking? Authorizing Provider  Ascorbic Acid (VITAMIN C PO) Take 1 tablet by mouth daily.   Yes [provider]  Calcium-Magnesium-Vitamin D (CALCIUM MAGNESIUM PO) Take 1 tablet by mouth daily.   Yes [provider]  ezetimibe (ZETIA) 10 MG tablet TAKE 1 TABLET BY MOUTH EVERY DAY Patient taking differently: Take 10 mg by mouth daily. 11/02/21  Yes Lorretta Harp, MD  Ferrous Sulfate (IRON PO) Take 1 tablet by mouth daily.   Yes [provider]  furosemide (LASIX) 40 MG tablet Take 40 mg by mouth every morning.   Yes [provider]  isosorbide mononitrate (IMDUR) 60 MG 24 hr tablet Take 1 tablet (60 mg total) by mouth daily. 07/20/21  Yes Ghimire, Henreitta Leber, MD  losartan (COZAAR) 100 MG tablet Take 100 mg by mouth daily. 05/05/21  Yes [provider]  metoprolol tartrate (LOPRESSOR) 25 MG tablet Take 1 tablet (25 mg total) by mouth 2 (two) times daily. 07/20/21  Yes Ghimire, Henreitta Leber, MD  potassium chloride (KLOR-CON) 10 MEQ tablet TAKE 1 TABLET(10 MEQ) BY MOUTH DAILY Patient taking differently: Take 10 mEq by mouth daily. TAKE 1  TABLET(10 MEQ) BY MOUTH DAILY 11/10/19  Yes Lorretta Harp, MD  VITAMIN D PO Take 1 tablet by mouth daily.   Yes [provider]  apixaban (ELIQUIS) 2.5 MG TABS tablet Take 1 tablet (2.5 mg total) by mouth 2 (two) times daily. Patient not taking: Reported on 07/03/2022 07/20/21   Jonetta Osgood, MD  apixaban (ELIQUIS) 2.5 MG TABS tablet Take 1 tablet (2.5 mg total) by mouth 2 (two) times daily. Patient not taking: Reported on 07/03/2022 08/16/21   Warren Lacy, PA-C  hydrALAZINE (APRESOLINE) 25 MG tablet TAKE 1 TABLET (25 MG TOTAL) BY MOUTH IN THE MORNING AND AT BEDTIME. Patient not taking: Reported on 07/03/2022 01/05/22   Lorretta Harp, MD    Discontinued Meds:   Medications Discontinued During This Encounter  Medication Reason   hydrALAZINE (APRESOLINE) 50 MG tablet Patient Preference   REPATHA SURECLICK 580 MG/ML SOAJ Patient Preference    Social History:  reports that she has quit smoking. She has never used smokeless tobacco. She reports that she does not drink alcohol and does not use drugs.  Family History:   Family History  Problem Relation Age of Onset   Diabetes Mother    Hypertension Mother    Heart disease Mother    Heart failure Mother    Hypertension Sister    Breast cancer Maternal Aunt        Age 37   Heart failure Maternal Aunt     Heart failure Maternal Uncle     Blood pressure (!) 181/66, pulse 61, temperature 98.9 F (37.2 C), temperature source Oral, resp. rate 16, height _0  (1.651 m), weight 73 kg, last menstrual period 07/16/2002, SpO2 100 %. General appearance: alert, cooperative, and appears stated age Head: Normocephalic, without obvious abnormality, atraumatic Eyes: conj pallor Back: symmetric, no curvature. ROM normal. No CVA tenderness. Resp: clear to auscultation bilaterally Chest wall: no tenderness Cardio: regular rate and rhythm GI: soft, non-tender; bowel sounds normal; no masses,  no organomegaly Extremities: edema 1+ Pulses: 2+ and symmetric Skin: Skin color, texture, turgor normal. No rashes or lesions       Samaad Hashem, Hunt Oris, MD 07/03/2022, 5:55 PM

## 2022-07-03 NOTE — ED Provider Notes (Signed)
[facility] EMERGENCY DEPARTMENT Provider Note   CSN: [id number] Arrival date & time: 07/02/22  1618     History  Chief Complaint  Patient presents with   Abnormal Lab    Tara Potts is a 88 y.o. female with medical history of anemia, arthritis, asthma, CAD, hyperlipidemia, hypertension.  Patient presents to ED for evaluation of abnormal labs.  Patient with daughter provides some history.  Per patient daughter, the patient had a fall where she lowered herself to the ground and became unsteady on her feet a few days before Thanksgiving.  The patient daughter reports that ever since this occurred, the patient has been slowly declining in terms of her ability to care for self at home.  Patient lives with her daughter.  The patient daughter states that the patient has had decreased oral intake ever since Thanksgiving, decreased urination, decreased bowel movements.  Patient daughter reports that the patient has decreased overall activity and energy.  Patient daughter reports that ever since this fall prior to Thanksgiving, the patient creatinine has been steadily trending up as noted during her initial evaluation after fall. The patient was seen by her PCP yesterday, had labs drawn and noted to have a very elevated creatinine.  Patient was sent here for further evaluation.  On examination, the patient is alert and oriented.  The patient denies any pain or complaints.  Patient and patient daughter denies any new medications.   Abnormal Lab      Home Medications Prior to Admission medications   Medication Sig Start Date End Date Taking? Authorizing Provider  apixaban (ELIQUIS) 2.5 MG TABS tablet Take 1 tablet (2.5 mg total) by mouth 2 (two) times daily. 07/20/21   Ghimire, Henreitta Leber, MD  apixaban (ELIQUIS) 2.5 MG TABS tablet Take 1 tablet (2.5 mg total) by mouth 2 (two) times daily. 08/16/21   Warren Lacy, PA-C  ezetimibe (ZETIA) 10 MG tablet TAKE 1 TABLET BY MOUTH  EVERY DAY 11/02/21   Lorretta Harp, MD  hydrALAZINE (APRESOLINE) 25 MG tablet TAKE 1 TABLET (25 MG TOTAL) BY MOUTH IN THE MORNING AND AT BEDTIME. 01/05/22   Lorretta Harp, MD  hydrALAZINE (APRESOLINE) 50 MG tablet 1 tablet with food    [provider]  isosorbide mononitrate (IMDUR) 60 MG 24 hr tablet Take 1 tablet (60 mg total) by mouth daily. 07/20/21   Ghimire, Henreitta Leber, MD  losartan (COZAAR) 100 MG tablet Take 100 mg by mouth daily. 05/05/21   [provider]  metoprolol tartrate (LOPRESSOR) 25 MG tablet Take 1 tablet (25 mg total) by mouth 2 (two) times daily. 07/20/21   Ghimire, Henreitta Leber, MD  potassium chloride (KLOR-CON) 10 MEQ tablet TAKE 1 TABLET(10 MEQ) BY MOUTH DAILY Patient taking differently: Take 10 mEq by mouth daily. TAKE 1 TABLET(10 MEQ) BY MOUTH DAILY 11/10/19   Lorretta Harp, MD  REPATHA SURECLICK 035 MG/ML SOAJ Inject 1 mL into the skin every 30 (thirty) days. 07/07/21   [provider]      Allergies    Codeine, Cymbalta [duloxetine hcl], Ezetimibe, and Gabapentin    Review of Systems   Review of Systems  All other systems reviewed and are negative.   Physical Exam Updated Vital Signs BP (!) 177/66   Pulse (!) 28   Temp 97.6 F (36.4 C) (Oral)   Resp (!) 22   Ht '5\' 5"'$  (1.651 m)   Wt 73 kg   LMP 07/16/2002   SpO2  100%   BMI 26.79 kg/m  Physical Exam Vitals and nursing note reviewed.  Constitutional:      General: She is not in acute distress.    Appearance: She is well-developed.  HENT:     Head: Normocephalic and atraumatic.     Mouth/Throat:     Mouth: Mucous membranes are moist.     Pharynx: Oropharynx is clear.  Eyes:     Conjunctiva/sclera: Conjunctivae normal.  Cardiovascular:     Rate and Rhythm: Normal rate and regular rhythm.     Heart sounds: No murmur heard. Pulmonary:     Effort: Pulmonary effort is normal. No respiratory distress.     Breath sounds: Normal breath sounds.  Abdominal:     General:  Abdomen is flat.     Palpations: Abdomen is soft.     Tenderness: There is no abdominal tenderness.  Musculoskeletal:        General: No swelling.     Cervical back: Neck supple.  Skin:    General: Skin is warm and dry.     Capillary Refill: Capillary refill takes less than 2 seconds.  Neurological:     Mental Status: She is alert. Mental status is at baseline.  Psychiatric:        Mood and Affect: Mood normal.     ED Results / Procedures / Treatments   Labs (all labs ordered are listed, but only abnormal results are displayed) Labs Reviewed  CBC WITH DIFFERENTIAL/PLATELET - Abnormal; Notable for the following components:      Result Value   RBC 2.88 (*)    Hemoglobin 7.7 (*)    HCT 24.2 (*)    RDW 19.2 (*)    All other components within normal limits  COMPREHENSIVE METABOLIC PANEL - Abnormal; Notable for the following components:   Sodium 133 (*)    CO2 19 (*)    BUN 89 (*)    Creatinine, Ser 12.47 (*)    Total Protein 9.9 (*)    Albumin 2.9 (*)    GFR, Estimated 3 (*)    Anion gap 16 (*)    All other components within normal limits  URINALYSIS, ROUTINE W REFLEX MICROSCOPIC - Abnormal; Notable for the following components:   APPearance HAZY (*)    Protein, ur 100 (*)    Leukocytes,Ua SMALL (*)    Bacteria, UA RARE (*)    All other components within normal limits  URINE CULTURE  CK  CBC WITH DIFFERENTIAL/PLATELET  COMPREHENSIVE METABOLIC PANEL  MAGNESIUM  PHOSPHORUS  CBG MONITORING, ED    EKG EKG Interpretation  Date/Time:  Monday July 02 2022 18:43:01 EST Ventricular Rate:  65 PR Interval:  188 QRS Duration: 90 QT Interval:  402 QTC Calculation: 418 R Axis:   75 Text Interpretation: Normal sinus rhythm Interpretation limited secondary to artifact Confirmed by Ripley Fraise [phone]) on 07/03/2022 1:46:54 AM  Radiology DG Chest 2 View  Result Date: 07/02/2022 CLINICAL DATA:  Weakness EXAM: CHEST - 2 VIEW COMPARISON:  06/06/2022 FINDINGS: Post  sternotomy changes. Cardiomegaly. No acute airspace disease, pleural effusion or pneumothorax. Aortic atherosclerosis. IMPRESSION: No active cardiopulmonary disease. Cardiomegaly. Electronically Signed   By: Donavan Foil M.D.   On: 07/02/2022 19:56   US Renal  Result Date: 07/02/2022 CLINICAL DATA:  Weakness end-stage renal disease EXAM: RENAL / URINARY TRACT ULTRASOUND COMPLETE COMPARISON:  08/22/2018 FINDINGS: Right Kidney: Renal measurements: 9 x 4.3 x 4.7 cm = volume: 96 mL. Echogenic cortex. No mass or hydronephrosis.  Left Kidney: Renal measurements: 9.1 x 4.4 x 4.2 cm = volume: 89 mL. Echogenic cortex. No mass or hydronephrosis. Bladder: Appears normal for degree of bladder distention. Other: None. IMPRESSION: Echogenic kidneys consistent with medical renal disease. No hydronephrosis. Electronically Signed   By: Donavan Foil M.D.   On: 07/02/2022 19:56    Procedures Procedures   Medications Ordered in ED Medications  acetaminophen (TYLENOL) tablet 650 mg (has no administration in time range)    Or  acetaminophen (TYLENOL) suppository 650 mg (has no administration in time range)  melatonin tablet 3 mg (has no administration in time range)  lactated ringers infusion (has no administration in time range)  sodium chloride 0.9 % bolus 1,000 mL (1,000 mLs Intravenous New Bag/Given 07/03/22 0252)    ED Course/ Medical Decision Making/ A&P                           Medical Decision Making  88 year old female presents to the ED for evaluation.  Please see HPI for further details.  On my examination patient afebrile and nontachycardic.  Patient lung sounds are clear bilaterally, she is not hypoxic.  Patient abdomen soft and compressible throughout, no tenderness.  Patient neurological examination shows no focal neurodeficits, patient at baseline.  Patient nontoxic in appearance.  Patient denies any complaints.  Patient lab work initiated in triage include CBC, CMP, CK, urinalysis, chest  x-ray, renal ultrasound.  Patient CBC with hemoglobin of 7.7 however patient baseline 8.2.  Patient CMP with decreased sodium to 133, elevated anion gap of 16, elevated creatinine of 12.47.  Patient creatinine 3 weeks ago was 4.71.  This is a marked increase.  Patient CK unremarkable at 74.  Patient urinalysis shows small leukocytes, protein.  The patient urine will be cultured.  At this time, the patient will need inpatient admission for acute renal failure.  Patient case discussed with Dr. Velia Meyer of [facility] hospitalist team who agrees to admit the patient.  Patient amenable to the plan.  Patient stable at time of admission.  Final Clinical Impression(s) / ED Diagnoses Final diagnoses:  Acute renal failure, unspecified acute renal failure type [facility])    Rx / DC Orders ED Discharge Orders     None         Azucena Cecil, PA-C 07/03/22 0321    Ripley Fraise, MD 07/03/22 [phone]

## 2022-07-04 ENCOUNTER — Inpatient Hospital Stay (HOSPITAL_COMMUNITY): Payer: Medicare HMO

## 2022-07-04 ENCOUNTER — Other Ambulatory Visit (HOSPITAL_COMMUNITY): Payer: Self-pay

## 2022-07-04 DIAGNOSIS — C9000 Multiple myeloma not having achieved remission: Secondary | ICD-10-CM | POA: Diagnosis not present

## 2022-07-04 LAB — COMPREHENSIVE METABOLIC PANEL
ALT: <5 U/L (ref 0–44)
AST: 22 U/L (ref 15–41)
Albumin: 2.4 g/dL — ABNORMAL LOW (ref 3.5–5.0)
Alkaline Phosphatase: 43 U/L (ref 38–126)
Anion gap: 15 (ref 5–15)
BUN: 83 mg/dL — ABNORMAL HIGH (ref 8–23)
CO2: 17 mmol/L — ABNORMAL LOW (ref 22–32)
Calcium: 8.7 mg/dL — ABNORMAL LOW (ref 8.9–10.3)
Chloride: 103 mmol/L (ref 98–111)
Creatinine, Ser: 11.72 mg/dL — ABNORMAL HIGH (ref 0.44–1.00)
GFR, Estimated: 3 mL/min — ABNORMAL LOW (ref >60)
Glucose, Bld: 101 mg/dL — ABNORMAL HIGH (ref 70–99)
Potassium: 4.9 mmol/L (ref 3.5–5.1)
Sodium: 135 mmol/L (ref 135–145)
Total Bilirubin: 0.4 mg/dL (ref 0.3–1.2)
Total Protein: 8.3 g/dL — ABNORMAL HIGH (ref 6.5–8.1)

## 2022-07-04 LAB — HEMOGLOBIN AND HEMATOCRIT, BLOOD
HCT: 24.4 % — ABNORMAL LOW (ref 36.0–46.0)
Hemoglobin: 7.8 g/dL — ABNORMAL LOW (ref 12.0–15.0)

## 2022-07-04 LAB — PREPARE RBC (CROSSMATCH)

## 2022-07-04 LAB — HEMOGLOBIN A1C
Hgb A1c MFr Bld: 6.2 % — ABNORMAL HIGH (ref 4.8–5.6)
Mean Plasma Glucose: 131 mg/dL

## 2022-07-04 LAB — PROTIME-INR
INR: 1.6 — ABNORMAL HIGH (ref 0.8–1.2)
Prothrombin Time: 19 seconds — ABNORMAL HIGH (ref 11.4–15.2)

## 2022-07-04 LAB — OSMOLALITY: Osmolality: 325 mOsm/kg (ref 275–295)

## 2022-07-04 MED ORDER — SODIUM CHLORIDE 0.9% IV SOLUTION: Freq: Once | INTRAVENOUS | Status: DC

## 2022-07-04 MED ORDER — METOPROLOL TARTRATE 25 MG PO TABS
25.0000 mg | ORAL_TABLET | Freq: Two times a day (BID) | ORAL | Status: DC
  Administered 2022-07-04 – 2022-07-06 (×5): 25 mg via ORAL
  Filled 2022-07-04 (×5): qty 1

## 2022-07-04 NOTE — ED Notes (Signed)
Date and time results received: 07/04/22 0112 (use smartphrase ".now" to insert current time)  Test: Hemoglobin Critical Value: 6.6  Name of Provider Notified: Claria Dice, MD  Orders Received? Or Actions Taken?:  notified

## 2022-07-04 NOTE — ED Notes (Signed)
Verbal blood consent obtained over the phone from Sima Matas, pt's daughter. Dual nurse verified consent by this RN and Colan Neptune, RN.

## 2022-07-04 NOTE — ED Notes (Signed)
Pt in no acute distress. No s/s of transfusion rxn.

## 2022-07-04 NOTE — ED Notes (Signed)
Help get patient back in bed on the monitor patient is resting with call bell in reach

## 2022-07-04 NOTE — ED Notes (Signed)
Pt attempted to climb out of bed. Confused, Aox1, urinated on floor. Placed back in bed, re-orientated to location, call button. Side rails raised.

## 2022-07-04 NOTE — TOC Benefit Eligibility Note (Signed)
Patient Advocate Encounter  Insurance verification completed.    The patient is currently admitted and upon discharge could be taking Eliquis 5 mg.  The current 30 day co-pay is $45.00.   The patient is insured through [employer] Medicare Part D   Bradie Sangiovanni, CPHT Pharmacy Patient Advocate Specialist [facility] Patient Advocate Team Direct Number: [phone]  Fax: [phone]       

## 2022-07-04 NOTE — ED Notes (Signed)
Pt returned from X-ray.  

## 2022-07-04 NOTE — Progress Notes (Addendum)
Consultation Progress Note   Patient: Tara Potts FWY:[id number] DOB: 1934/05/01 DOA: 07/02/2022 DOS: the patient was seen and examined on 07/04/2022 Primary service: DibiaManfred Shirts, MD  Brief hospital course: 34 F who has been admitted with acute renal failure after outpatient labs reflected interval worsening in serum creatinine, which appears to be progressively increasing over the course of the last 3 to 4 weeks.  No evidence of respiratory distress, maintaining O2 sats in the high 90s on room air. No sob. No orthopnea.  Continues to produce urine.  Is alert and oriented .potassium 4.9.  Mild anion gap metabolic acidosis.  Chest x-ray clear.  Prior to worsening of her Cr, she was started on Lasix as an outpatient for some lower extremity edema.  She received Gentle IV hydration. Hb dropped to 6.6, she has been transfused PRBCs. Nephrology saw patient yesterday, there is a high suspicion for MM. Hematology consult placed.  Assessment and Plan: Principal Problem:   Acute renal failure superimposed on stage 3b chronic kidney disease (HCC) Acute blood loss anemia Active Problems:   Essential hypertension   Acute hyponatremia   Chronic anemia   Pyuria, sterile   Chronic diastolic CHF (congestive heart failure) (HCC)   Paroxysmal atrial fibrillation (HCC)  Acute renal failure superimposed on CKD 3B:  -Progressive increase in creatinine over the last few weeks relative to the last time she was noted to be at her baseline, with creatinine of 1.6 in March 2023. Scr at admission was 12  -Prior diagnosis of MM in 2021 with no treatment since then. -Suspicious for myeloma kidney with possibly superimposed amyloid light chain deposition per nephrology. -Heme/Onc consulted -No  absolute need for dialysis but possibly  Therapeutic plasma exchange. -Holding nephrotoxins, dose meds renally,   Acute blood loss anemia -S/P transfusion of PRBCs, Hb improved to 7.8 -Monitor trend -Check TIBC,  iron, ferritin  Pyuria-No fevers or dysuria noted, Nephrology recommends biopsy Hypertension-resume metoprolol, Isorsobide and hydralazine. Holding losatan   Paroxysmal atrial fibrillation (HCC)-Currently rate controlled, continue AC with Eliquis   Chronic diastolic CHF (congestive heart failure) ([place])- Dyspnea noted leading to admission, clinically does not appear overtly overloaded, would monitor volume status closely. Hold Lasix.  Below is a copy oof her encounter with Hematology-Dr Tara Potts  during her last visit months ago. -Would confirm if patient is willing to proceed with treatment. If yes Dr Tara Potts will be notified.    This is a very pleasant 88 years old white female with plasma cell neoplasm, multiple myeloma.  The patient has been in observation for several years since she declined any treatment. She was supposed to come back in May 2020 but she was lost to follow-up  She finally presented in December 2021 and repeat myeloma panel showed further increase in her protein study. The patient has been reluctant about any treatment. I recommended for her to have repeat myeloma panel in the next few days. I also discussed with the patient and her daughter her current condition and give them information about multiple myeloma and possible treatment options. I strongly recommend for the patient to consider treatment with subcutaneous Velcade and Decadron to control her disease but the patient and her daughter are in denial about her condition and they thought that they had been seen for anemia. I explained to the patient and her daughter that her anemia and the renal insufficiency are likely related to her multiple myeloma and hopefully treatment of her condition may improve some of these issues. I  provided them with information and handout about multiple myeloma, Velcade and Decadron. If the patient decides to proceed with the treatment, her daughter will call next week otherwise I will  see her back for follow-up visit in 3 months with repeat myeloma panel for close monitoring of her condition.         [facility] will continue to follow the patient.  Subjective: Overnight events noted. Patient pleasant this morning, still wanting to go home. Daughter at bedsiude.  She reports poor appetite, denies nausea, vomiting, abd pain.   Physical Exam: Vitals:   07/04/22 0600 07/04/22 0900 07/04/22 1000 07/04/22 1008  BP: (!) 170/63 (!) 167/58 (!) 172/65 (!) 172/65  Pulse: 68  71   Resp: _0 Temp:   97.6 F (36.4 C)   TempSrc:   Oral   SpO2: 99%  97%   Weight:      Height:      General appearance:Elderly female in no acute distress Head: Normocephalic, without obvious abnormality, atraumatic Eyes: Pale conjuctiva Resp: clear to auscultation bilaterally, no wheezing  or rhonchi Cardio: regular rate and rhythm GI: soft, non-tender; bowel sounds normal; no masses,  no organomegaly Extremities: edema 1+ Pulses: 2+ and symmetric Skin: Skin color, texture, turgor normal. No rashes or lesions    Data Reviewed:  There are no new results to review at this time.  Family Communication: Daughter at bedside  Time spent: 15 minutes.  Author: Cristela Felt, MD 07/04/2022 10:49 AM  For on call review [email or web address].

## 2022-07-04 NOTE — ED Notes (Signed)
Pt to Xray.

## 2022-07-04 NOTE — Consult Note (Signed)
Tara Potts   [phone]  CONSULT NOTE  REFERRING PHYSICIAN: Dr. Babs Bertin   REASON FOR CONSULTATION:  88 years old African-American female with history of plasma cell dyscrasia and presenting with acute renal failure.  HPI Tara Potts is a 88 y.o. female with past medical history significant for multiple medical problems including history of coronary artery disease, bilateral carotid disease, asthma, anemia, arthritis, status post CABG hypertension, dyslipidemia, peripheral neuropathy, seizure-like activity as well as history of plasma cell dyscrasia with no bony lytic lesions that was initially diagnosed on May 13, 2018 with a bone marrow biopsy and aspirate at that time showed cellular marrow involvement by plasma cell neoplasm with plasma cells of 30-40%.  She had a skeletal bone survey at that time that was unremarkable for lytic lesion.  The patient was followed closely with myeloma panel and it was recommended for her several times to consider treatment for her multiple myeloma but she refused.  Was lost to follow-up between October 31, 2018 until July 11, 2020 when she presented for evaluation with worsening myeloma panel.  I recommended for her to consider treatment for the multiple myeloma but she again declined.  Follow-up visit on October 03, 2020, I had a lengthy discussion with the patient and her daughter at that time about the importance of treatment for her multiple myeloma especially with worsening anemia and renal insufficiency but they declined and again she was lost to follow-up for another 21 months before she presented to the hospital today with significantly elevated serum creatinine that was seen on her outpatient lab work by her primary care provider.  The serum creatinine of 12.47 with BUN of 89.  Total protein was 9.9 but she had normal serum calcium of 9.2.  CBC showed hemoglobin of 7.7 and hematocrit 24.2%.  I  was consulted to see the patient today for evaluation and recommendation regarding treatment of her condition. When seen today the patient is feeling fine except for the fatigue.  Her daughter was at the bedside.  They are now changing their mind about consideration of treatment.  She has no current nausea, vomiting, diarrhea or constipation.  She has no chest pain, shortness of breath, cough or hemoptysis.  HPI  Past Medical History:  Diagnosis Date   Anemia    Arthritis    Asthma    as a child   CAD (coronary artery disease)    Carotid disease, bilateral ([place])    mild   Fibroid    Hx of CABG 2007   [facility]   Hyperlipemia    Hypertension    Lumbosacral radiculopathy 09/10/2014   Menopausal symptoms    Multiple myeloma ([place]) 05/26/2018   Peripheral neuropathy    Seizure-like activity ([facility])     Past Surgical History:  Procedure Laterality Date   CARDIAC CATHETERIZATION  01/09/2008   diffusely diseased graft to the PDA & diffuse PDA disease   CAROTID DOPPLER  08/27/2011   mod. right & nild left ICA stenosis   CATARACT SURGERY     CORONARY ARTERY BYPASS GRAFT     FOOT SURGERY     JOINT REPLACEMENT     Right hip Dr. Alvan Dame 01-28-18    LEFT HEART CATH AND CORS/GRAFTS ANGIOGRAPHY N/A 07/18/2021   Procedure: LEFT HEART CATH AND CORS/GRAFTS ANGIOGRAPHY;  Surgeon: Belva Crome, MD;  Location: [place] [facility];  Service: Cardiovascular;  Laterality: N/A;   MASS REMOVED FROM CERVIX  12/1997   Benign endocervical inclusion cysts   NM MYOVIEW LTD  01/12/2011   no ischemia   TOTAL HIP ARTHROPLASTY Right 01/28/2018   Procedure: RIGHT TOTAL HIP ARTHROPLASTY ANTERIOR APPROACH;  Surgeon: Paralee Cancel, MD;  Location: [facility];  Service: Orthopedics;  Laterality: Right;  70 mins   TRIPLE HEART BYPASS  05/2006   [facility]   US ECHOCARDIOGRAPHY  10/30/2007   mild MA,MR,TR,AOV mildly sclerotic,density oted in the prox asc aorta    Family History  Problem Relation Age of Onset    Diabetes Mother    Hypertension Mother    Heart disease Mother    Heart failure Mother    Hypertension Sister    Breast cancer Maternal Aunt        Age 23   Heart failure Maternal Aunt    Heart failure Maternal Uncle     Social History Social History   Tobacco Use   Smoking status: Former   Smokeless tobacco: Never   Tobacco comments:    Quit 30 years ago  Vaping Use   Vaping Use: Never used  Substance Use Topics   Alcohol use: No    Alcohol/week: 0.0 standard drinks of alcohol   Drug use: No    Allergies  Allergen Reactions   Codeine Nausea And Vomiting    confusion   Cymbalta [Duloxetine Hcl] Nausea Only   Ezetimibe     Other reaction(s): cramping   Gabapentin     Other reaction(s): felt crazy    Current Facility-Administered Medications  Medication Dose Route Frequency Provider Last Rate Last Admin   0.9 %  sodium chloride infusion (Manually program via Guardrails IV Fluids)   Intravenous Once Quintella Baton, MD   Held at 07/04/22 0118   acetaminophen (TYLENOL) tablet 650 mg  650 mg Oral Q6H PRN Howerter, Justin B, DO       Or   acetaminophen (TYLENOL) suppository 650 mg  650 mg Rectal Q6H PRN Howerter, Justin B, DO       apixaban (ELIQUIS) tablet 2.5 mg  2.5 mg Oral BID Howerter, Justin B, DO   2.5 mg at 07/04/22 1008   haloperidol lactate (HALDOL) injection 2 mg  2 mg Intravenous Q6H PRN Crosley, Debby, MD       hydrALAZINE (APRESOLINE) tablet 25 mg  25 mg Oral BID Howerter, Justin B, DO   25 mg at 07/04/22 1008   melatonin tablet 3 mg  3 mg Oral QHS PRN Howerter, Justin B, DO       metoprolol tartrate (LOPRESSOR) tablet 25 mg  25 mg Oral BID Dibia, Manfred Shirts, MD   25 mg at 07/04/22 1225   Current Outpatient Medications  Medication Sig Dispense Refill   Ascorbic Acid (VITAMIN C PO) Take 1 tablet by mouth daily.     Calcium-Magnesium-Vitamin D (CALCIUM MAGNESIUM PO) Take 1 tablet by mouth daily.     ezetimibe (ZETIA) 10 MG tablet TAKE 1 TABLET BY MOUTH EVERY  DAY (Patient taking differently: Take 10 mg by mouth daily.) 90 tablet 3   Ferrous Sulfate (IRON PO) Take 1 tablet by mouth daily.     furosemide (LASIX) 40 MG tablet Take 40 mg by mouth every morning.     isosorbide mononitrate (IMDUR) 60 MG 24 hr tablet Take 1 tablet (60 mg total) by mouth daily. 30 tablet 3   losartan (COZAAR) 100 MG tablet Take 100 mg by mouth daily.     metoprolol tartrate (LOPRESSOR) 25 MG tablet Take  1 tablet (25 mg total) by mouth 2 (two) times daily. 60 tablet 3   potassium chloride (KLOR-CON) 10 MEQ tablet TAKE 1 TABLET(10 MEQ) BY MOUTH DAILY (Patient taking differently: Take 10 mEq by mouth daily. TAKE 1 TABLET(10 MEQ) BY MOUTH DAILY) 90 tablet 3   VITAMIN D PO Take 1 tablet by mouth daily.     apixaban (ELIQUIS) 2.5 MG TABS tablet Take 1 tablet (2.5 mg total) by mouth 2 (two) times daily. (Patient not taking: Reported on 07/03/2022) 60 tablet 3   apixaban (ELIQUIS) 2.5 MG TABS tablet Take 1 tablet (2.5 mg total) by mouth 2 (two) times daily. (Patient not taking: Reported on 07/03/2022) 28 tablet 0   hydrALAZINE (APRESOLINE) 25 MG tablet TAKE 1 TABLET (25 MG TOTAL) BY MOUTH IN THE MORNING AND AT BEDTIME. (Patient not taking: Reported on 07/03/2022) 180 tablet 1    Review of Systems  Constitutional: positive for fatigue Eyes: negative Ears, nose, mouth, throat, and face: negative Respiratory: negative Cardiovascular: negative Gastrointestinal: negative Genitourinary:negative Integument/breast: negative Hematologic/lymphatic: negative Musculoskeletal:positive for arthralgias and muscle weakness Neurological: negative Behavioral/Psych: negative Endocrine: negative Allergic/Immunologic: negative  Physical Exam  BJY:NWGNF, healthy, no distress, well nourished, and well developed SKIN: skin color, texture, turgor are normal, no rashes or significant lesions HEAD: Normocephalic, No masses, lesions, tenderness or abnormalities EYES: normal, PERRLA, Conjunctiva  are pink and non-injected EARS: External ears normal, Canals clear OROPHARYNX:no exudate, no erythema, and lips, buccal mucosa, and tongue normal  NECK: supple, no adenopathy, no JVD LYMPH:  no palpable lymphadenopathy, no hepatosplenomegaly BREAST:not examined LUNGS: clear to auscultation , and palpation HEART: regular rate & rhythm, no murmurs, and no gallops ABDOMEN:abdomen soft, non-tender, normal bowel sounds, and no masses or organomegaly BACK: Back symmetric, no curvature., No CVA tenderness EXTREMITIES:no joint deformities, effusion, or inflammation, no edema  NEURO: alert & oriented x 3 with fluent speech, no focal motor/sensory deficits  PERFORMANCE STATUS: ECOG 1  LABORATORY DATA: Lab Results  Component Value Date   WBC 6.4 07/04/2022   HGB 7.8 (L) 07/04/2022   HCT 24.4 (L) 07/04/2022   MCV 83.0 07/04/2022   PLT 225 07/04/2022    _0 @  RADIOGRAPHIC STUDIES: DG Chest 2 View  Result Date: 07/02/2022 CLINICAL DATA:  Weakness EXAM: CHEST - 2 VIEW COMPARISON:  06/06/2022 FINDINGS: Post sternotomy changes. Cardiomegaly. No acute airspace disease, pleural effusion or pneumothorax. Aortic atherosclerosis. IMPRESSION: No active cardiopulmonary disease. Cardiomegaly. Electronically Signed   By: Donavan Foil M.D.   On: 07/02/2022 19:56   US Renal  Result Date: 07/02/2022 CLINICAL DATA:  Weakness end-stage renal disease EXAM: RENAL / URINARY TRACT ULTRASOUND COMPLETE COMPARISON:  08/22/2018 FINDINGS: Right Kidney: Renal measurements: 9 x 4.3 x 4.7 cm = volume: 96 mL. Echogenic cortex. No mass or hydronephrosis. Left Kidney: Renal measurements: 9.1 x 4.4 x 4.2 cm = volume: 89 mL. Echogenic cortex. No mass or hydronephrosis. Bladder: Appears normal for degree of bladder distention. Other: None. IMPRESSION: Echogenic kidneys consistent with medical renal disease. No hydronephrosis. Electronically Signed   By: Donavan Foil M.D.   On: 07/02/2022 19:56   DG Ankle Complete  Right  Result Date: 06/06/2022 CLINICAL DATA:  88 year old female status post fall with pain. EXAM: RIGHT ANKLE - COMPLETE 3+ VIEW COMPARISON:  Right foot series today. FINDINGS: Generalized soft tissue swelling and stranding in the visible tib fib with superimposed dystrophic or vascular calcifications. Mortise joint alignment maintained. No evidence of ankle joint effusion. Talar dome appears intact. No acute fracture or  dislocation identified in the distal tibia, fibula, or calcaneus. IMPRESSION: Generalized soft tissue swelling and/or edema. No acute fracture or dislocation identified about the right ankle. Electronically Signed   By: Genevie Ann M.D.   On: 06/06/2022 05:56   DG Foot Complete Right  Result Date: 06/06/2022 CLINICAL DATA:  88 year old female status post fall with pain. EXAM: RIGHT FOOT COMPLETE - 3+ VIEW COMPARISON:  Right ankle series today reported separately. Right foot series 07/05/2014. FINDINGS: Chronic midfoot degeneration, 1st MTP degeneration with progression since 2015 including bulky dorsal midfoot degenerative spurring now. Anterior and dorsal soft tissue swelling in the foot. Calcaneus appears intact. No acute fracture or dislocation identified in the foot. Ankle is detailed separately. IMPRESSION: 1. Soft tissue swelling with chronic degeneration but no acute fracture or dislocation identified about the right foot. 2. Ankle is reported separately. Electronically Signed   By: Genevie Ann M.D.   On: 06/06/2022 05:55   CT HEAD WO CONTRAST  Result Date: 06/06/2022 CLINICAL DATA:  Fall, head and facial trauma. EXAM: CT HEAD WITHOUT CONTRAST CT MAXILLOFACIAL WITHOUT CONTRAST CT CERVICAL SPINE WITHOUT CONTRAST TECHNIQUE: Multidetector CT imaging of the head, cervical spine, and maxillofacial structures were performed using the standard protocol without intravenous contrast. Multiplanar CT image reconstructions of the cervical spine and maxillofacial structures were also generated.  RADIATION DOSE REDUCTION: This exam was performed according to the departmental dose-optimization program which includes automated exposure control, adjustment of the mA and/or kV according to patient size and/or use of iterative reconstruction technique. COMPARISON:  None. FINDINGS: CT HEAD FINDINGS Brain: No acute intracranial hemorrhage, midline shift or mass effect. No extra-axial fluid collection. Diffuse atrophy is noted. Periventricular white matter hypodensities are noted bilaterally and hypodensities are present in the pons. No hydrocephalus. Vascular: No hyperdense vessel or unexpected calcification. Skull: Normal. Negative for fracture or focal lesion. Other: None. CT MAXILLOFACIAL FINDINGS Osseous: No fracture or mandibular dislocation. No destructive process. Orbits: No traumatic or inflammatory finding. Sinuses: Clear. Soft tissues: Negative. CT CERVICAL SPINE FINDINGS Alignment: There is mild anterolisthesis at C2-C3 and C3-C4 with kyphosis of the midcervical spine. Skull base and vertebrae: No acute fracture. There is fusion of the C4-C5 facets on the right. Soft tissues and spinal canal: No prevertebral fluid or swelling. No visible canal hematoma. Disc levels: Multilevel intervertebral disc space narrowing, uncovertebral osteophyte formation, and facet arthropathy, most pronounced at C5-C6. Upper chest: Sternotomy wires are noted. Other: Carotid artery calcifications. IMPRESSION: 1. No acute intracranial hemorrhage. 2. Atrophy with chronic microvascular ischemic changes. 3. No evidence of facial bone fracture. 4. Multilevel degenerative changes in the cervical spine without evidence of acute fracture. Electronically Signed   By: Brett Fairy M.D.   On: 06/06/2022 04:17   CT MAXILLOFACIAL WO CONTRAST  Result Date: 06/06/2022 CLINICAL DATA:  Fall, head and facial trauma. EXAM: CT HEAD WITHOUT CONTRAST CT MAXILLOFACIAL WITHOUT CONTRAST CT CERVICAL SPINE WITHOUT CONTRAST TECHNIQUE: Multidetector  CT imaging of the head, cervical spine, and maxillofacial structures were performed using the standard protocol without intravenous contrast. Multiplanar CT image reconstructions of the cervical spine and maxillofacial structures were also generated. RADIATION DOSE REDUCTION: This exam was performed according to the departmental dose-optimization program which includes automated exposure control, adjustment of the mA and/or kV according to patient size and/or use of iterative reconstruction technique. COMPARISON:  None. FINDINGS: CT HEAD FINDINGS Brain: No acute intracranial hemorrhage, midline shift or mass effect. No extra-axial fluid collection. Diffuse atrophy is noted. Periventricular white matter hypodensities are noted  bilaterally and hypodensities are present in the pons. No hydrocephalus. Vascular: No hyperdense vessel or unexpected calcification. Skull: Normal. Negative for fracture or focal lesion. Other: None. CT MAXILLOFACIAL FINDINGS Osseous: No fracture or mandibular dislocation. No destructive process. Orbits: No traumatic or inflammatory finding. Sinuses: Clear. Soft tissues: Negative. CT CERVICAL SPINE FINDINGS Alignment: There is mild anterolisthesis at C2-C3 and C3-C4 with kyphosis of the midcervical spine. Skull base and vertebrae: No acute fracture. There is fusion of the C4-C5 facets on the right. Soft tissues and spinal canal: No prevertebral fluid or swelling. No visible canal hematoma. Disc levels: Multilevel intervertebral disc space narrowing, uncovertebral osteophyte formation, and facet arthropathy, most pronounced at C5-C6. Upper chest: Sternotomy wires are noted. Other: Carotid artery calcifications. IMPRESSION: 1. No acute intracranial hemorrhage. 2. Atrophy with chronic microvascular ischemic changes. 3. No evidence of facial bone fracture. 4. Multilevel degenerative changes in the cervical spine without evidence of acute fracture. Electronically Signed   By: Brett Fairy M.D.    On: 06/06/2022 04:17   CT CERVICAL SPINE WO CONTRAST  Result Date: 06/06/2022 CLINICAL DATA:  Fall, head and facial trauma. EXAM: CT HEAD WITHOUT CONTRAST CT MAXILLOFACIAL WITHOUT CONTRAST CT CERVICAL SPINE WITHOUT CONTRAST TECHNIQUE: Multidetector CT imaging of the head, cervical spine, and maxillofacial structures were performed using the standard protocol without intravenous contrast. Multiplanar CT image reconstructions of the cervical spine and maxillofacial structures were also generated. RADIATION DOSE REDUCTION: This exam was performed according to the departmental dose-optimization program which includes automated exposure control, adjustment of the mA and/or kV according to patient size and/or use of iterative reconstruction technique. COMPARISON:  None. FINDINGS: CT HEAD FINDINGS Brain: No acute intracranial hemorrhage, midline shift or mass effect. No extra-axial fluid collection. Diffuse atrophy is noted. Periventricular white matter hypodensities are noted bilaterally and hypodensities are present in the pons. No hydrocephalus. Vascular: No hyperdense vessel or unexpected calcification. Skull: Normal. Negative for fracture or focal lesion. Other: None. CT MAXILLOFACIAL FINDINGS Osseous: No fracture or mandibular dislocation. No destructive process. Orbits: No traumatic or inflammatory finding. Sinuses: Clear. Soft tissues: Negative. CT CERVICAL SPINE FINDINGS Alignment: There is mild anterolisthesis at C2-C3 and C3-C4 with kyphosis of the midcervical spine. Skull base and vertebrae: No acute fracture. There is fusion of the C4-C5 facets on the right. Soft tissues and spinal canal: No prevertebral fluid or swelling. No visible canal hematoma. Disc levels: Multilevel intervertebral disc space narrowing, uncovertebral osteophyte formation, and facet arthropathy, most pronounced at C5-C6. Upper chest: Sternotomy wires are noted. Other: Carotid artery calcifications. IMPRESSION: 1. No acute  intracranial hemorrhage. 2. Atrophy with chronic microvascular ischemic changes. 3. No evidence of facial bone fracture. 4. Multilevel degenerative changes in the cervical spine without evidence of acute fracture. Electronically Signed   By: Brett Fairy M.D.   On: 06/06/2022 04:17   DG Shoulder Right Port  Result Date: 06/06/2022 CLINICAL DATA:  Fall. EXAM: RIGHT SHOULDER - 1 VIEW COMPARISON:  None Available. FINDINGS: There is no evidence of fracture or dislocation. Hypertrophic degenerative changes are present at the acromioclavicular joint. Mild degenerative changes are noted at the glenohumeral joint. Sternotomy wires are noted over the midline. Soft tissues are unremarkable. IMPRESSION: No acute fracture or dislocation. Electronically Signed   By: Brett Fairy M.D.   On: 06/06/2022 03:41   DG Pelvis Portable  Result Date: 06/06/2022 CLINICAL DATA:  Trauma, fall. EXAM: PORTABLE PELVIS 1-2 VIEWS COMPARISON:  01/28/2018. FINDINGS: No acute fracture or dislocation. Total hip arthroplasty changes are present on  the left without evidence of hardware loosening. Degenerative changes are noted in the lower lumbar spine. IMPRESSION: Status post total hip arthroplasty on the right. No evidence of acute fracture or dislocation. Electronically Signed   By: Brett Fairy M.D.   On: 06/06/2022 03:40   DG Chest Port 1 View  Result Date: 06/06/2022 CLINICAL DATA:  Trauma, fall. EXAM: PORTABLE CHEST 1 VIEW COMPARISON:  07/16/2021. FINDINGS: Heart is enlarged and the mediastinal contour is stable. There is atherosclerotic calcification of the aorta. Mild atelectasis is present at the left lung base. No effusion or pneumothorax. Sternotomy wires are noted over the midline. No acute osseous abnormality. IMPRESSION: 1. Atelectasis at the left lung base. 2. Cardiomegaly. Electronically Signed   By: Brett Fairy M.D.   On: 06/06/2022 03:39    ASSESSMENT: This is an 88 years old African-American female with plasma  cell dyscrasia presented with acute renal failure likely secondary to her multiple myeloma.  The patient was initially diagnosed in 2019 but unfortunately she has not been compliant with her follow-up visit or the treatment options that were provided for her in the past.   PLAN: I had a lengthy discussion with the patient and her daughter today about her condition and treatment options.  Unfortunately it may be related to recover her renal function especially with worsening multiple myeloma which is likely the cause of her renal insufficiency. I will repeat the myeloma panel but no need to repeat bone marrow biopsy and aspirate since the previous one in 2019 was definitely consistent with plasma cell dyscrasia. I will also repeat the skeletal bone survey to detect any lytic lesions. I discussed with the patient and her daughter the treatment options and the only available option that they could offer this patient now is subcutaneous Velcade Cytoxan and Decadron.  I will place the care plan and hopefully this can be started during her hospitalization but if she is stable she can be discharged and receive her treatment on outpatient basis at the [facility]. The patient and her daughter are also considering proceeding with dialysis for the renal insufficiency.  I am not sure if they will be any clear benefit from asthma pheresis at this point. I also discussed with the patient and her daughter consideration of palliative care and hospice but they are interested in treatment for now.  Hopefully she will be compliant with her treatment plan. I will order the treatment plan and I will monitor the patient closely through epic and see her on as-needed basis. The patient voices understanding of current disease status and treatment options and is in agreement with the current care plan.  All questions were answered. The patient knows to call the clinic with any problems, questions or concerns. We  can certainly see the patient much sooner if necessary.  Thank you so much for allowing me to participate in the care of KIMMY TOTTEN. I will continue to follow up the patient with you and assist in her care.  Disclaimer: This note was dictated with voice recognition software. Similar sounding words can inadvertently be transcribed and may not be corrected upon review.   Eilleen Kempf July 04, 2022, 5:20 PM

## 2022-07-04 NOTE — Progress Notes (Signed)
START ON PATHWAY REGIMEN - Multiple Myeloma and Other Plasma Cell Dyscrasias     A cycle is every 28 days:     Dexamethasone      Bortezomib      Cyclophosphamide   **Always confirm dose/schedule in your pharmacy ordering system**  Patient Characteristics: Multiple Myeloma, Newly Diagnosed, Transplant Ineligible or Refused, Standard Risk Disease Classification: Multiple Myeloma R-ISS Staging: II Therapeutic Status: Newly Diagnosed Is Patient Eligible for Transplant<= Transplant Ineligible or Refused Risk Status: Standard Risk Intent of Therapy: Non-Curative / Palliative Intent, Discussed with Patient

## 2022-07-04 NOTE — Progress Notes (Signed)
[facility] Progress Note   Assessment/ Plan:   Acute on CKD3b w/ baseline creatinine in the 1.5-1.8 range but that was in Jan-Mar 2023 with increase to 4.71 06/06/22 and then 12 at f/u with primary care provider office. No e/o obstruction on ultrasound but she has had myeloma diagnosed in 2021 which has never been treated. Already with a  M-spike UPEP of 39% '75mg'$  and a positive elevation in free Kappa light chains in 06/22/2020. Suspicious for myeloma kidney with possibly superimposed amyloid light chain deposition as well. - FLC, SPEP have been sent  - heme/ onc consulted, appreciate assistance - I have also consulted pall care to help with decision making - no hard indication for RRT at present- will continue to follow - if she ultimately decides not to pursue rx for MM dialysis would be unlikely to contribute to QOL     Pyuria with no fevers, dysuria - send culture Anemia - worse than her baseline and have to wonder if the myeloma has continued to progress. Hypertension - may restart outpt regimen but hold the Losartan given renal failure.  Subjective:    Seen in room.  Dtr at bedside.  Long talk today- they seem to want to regroup and reconsider treatment.  Discussed BM Bx- dtr expresses concern over pain and we discussed possible ways to mitigate that.  They want to see what options there are for treatment to help with decision making.  I discussed that it would not add to QOL- if this is myeloma kidney- to offer dialysis without treatment for MM.     Objective:   BP (!) 159/65   Pulse 71   Temp 97.6 F (36.4 C) (Oral)   Resp (!) 9   Ht '5\' 5"'$  (1.651 m)   Wt 73 kg   LMP 07/16/2002   SpO2 97%   BMI 26.79 kg/m   Physical Exam: MPN:TIRWE in bed, NAD CVS:RRR Resp: clear Abd: soft Ext: 1+ LE edema  Labs: BMET Recent Labs  Lab 07/02/22 1852 07/03/22 0339 07/04/22 0025  NA 133* 132* 135  K 4.9 4.3 4.9  CL 98 99 103  CO2 19* 18* 17*  GLUCOSE 99 109* 101*   BUN 89* 89* 83*  CREATININE 12.47* 12.41* 11.72*  CALCIUM 9.2 8.6* 8.7*  PHOS  --  6.1*  --    CBC Recent Labs  Lab 07/02/22 1852 07/03/22 0339 07/04/22 0025 07/04/22 0740  WBC 6.7 6.1 6.4  --   NEUTROABS 3.7 3.4  --   --   HGB 7.7* 7.3* 6.6* 7.8*  HCT 24.2* 22.0* 20.5* 24.4*  MCV 84.0 81.5 83.0  --   PLT 254 216 225  --       Medications:     sodium chloride   Intravenous Once   apixaban  2.5 mg Oral BID   hydrALAZINE  25 mg Oral BID   metoprolol tartrate  25 mg Oral BID     Madelon Lips, MD 07/04/2022, 2:20 PM

## 2022-07-05 ENCOUNTER — Other Ambulatory Visit: Payer: Self-pay

## 2022-07-05 DIAGNOSIS — N1832 Chronic kidney disease, stage 3b: Secondary | ICD-10-CM | POA: Diagnosis not present

## 2022-07-05 DIAGNOSIS — N178 Other acute kidney failure: Secondary | ICD-10-CM

## 2022-07-05 LAB — BPAM RBC
Blood Product Expiration Date: 202401192359
ISSUE DATE / TIME: 202312200127
Unit Type and Rh: 7300

## 2022-07-05 LAB — KAPPA/LAMBDA LIGHT CHAINS
Kappa free light chain: 6848.6 mg/L — ABNORMAL HIGH (ref 3.3–19.4)
Kappa, lambda light chain ratio: 845.51 — ABNORMAL HIGH (ref 0.26–1.65)
Lambda free light chains: 8.1 mg/L (ref 5.7–26.3)

## 2022-07-05 LAB — TYPE AND SCREEN
ABO/RH(D): AB POS
Antibody Screen: NEGATIVE
Unit division: 0

## 2022-07-05 LAB — BASIC METABOLIC PANEL
Anion gap: 13 (ref 5–15)
BUN: 80 mg/dL — ABNORMAL HIGH (ref 8–23)
CO2: 16 mmol/L — ABNORMAL LOW (ref 22–32)
Calcium: 8.7 mg/dL — ABNORMAL LOW (ref 8.9–10.3)
Chloride: 107 mmol/L (ref 98–111)
Creatinine, Ser: 11.89 mg/dL — ABNORMAL HIGH (ref 0.44–1.00)
GFR, Estimated: 3 mL/min — ABNORMAL LOW (ref >60)
Glucose, Bld: 88 mg/dL (ref 70–99)
Potassium: 4.4 mmol/L (ref 3.5–5.1)
Sodium: 136 mmol/L (ref 135–145)

## 2022-07-05 LAB — CBC
HCT: 20.5 % — ABNORMAL LOW (ref 36.0–46.0)
HCT: 23.8 % — ABNORMAL LOW (ref 36.0–46.0)
Hemoglobin: 6.6 g/dL — CL (ref 12.0–15.0)
Hemoglobin: 7.8 g/dL — ABNORMAL LOW (ref 12.0–15.0)
MCH: 26.7 pg (ref 26.0–34.0)
MCH: 27.2 pg (ref 26.0–34.0)
MCHC: 32.2 g/dL (ref 30.0–36.0)
MCHC: 32.8 g/dL (ref 30.0–36.0)
MCV: 82.9 fL (ref 80.0–100.0)
MCV: 83 fL (ref 80.0–100.0)
Platelets: 203 10*3/uL (ref 150–400)
Platelets: 225 10*3/uL (ref 150–400)
RBC: 2.47 MIL/uL — ABNORMAL LOW (ref 3.87–5.11)
RBC: 2.87 MIL/uL — ABNORMAL LOW (ref 3.87–5.11)
RDW: 19.3 % — ABNORMAL HIGH (ref 11.5–15.5)
RDW: 19.8 % — ABNORMAL HIGH (ref 11.5–15.5)
WBC: 6.3 10*3/uL (ref 4.0–10.5)
WBC: 6.4 10*3/uL (ref 4.0–10.5)
nRBC: 0 % (ref 0.0–0.2)
nRBC: 0 % (ref 0.0–0.2)

## 2022-07-05 LAB — IGG, IGA, IGM
IgA: 22 mg/dL — ABNORMAL LOW (ref 64–422)
IgG (Immunoglobin G), Serum: 3516 mg/dL — ABNORMAL HIGH (ref 586–1602)
IgM (Immunoglobulin M), Srm: 6 mg/dL — ABNORMAL LOW (ref 26–217)

## 2022-07-05 LAB — MRSA NEXT GEN BY PCR, NASAL: MRSA by PCR Next Gen: NOT DETECTED

## 2022-07-05 MED ORDER — DIPHENHYDRAMINE HCL 12.5 MG/5ML PO ELIX
6.2500 mg | ORAL_SOLUTION | Freq: Three times a day (TID) | ORAL | Status: DC | PRN
  Administered 2022-07-05: 6.25 mg via ORAL
  Filled 2022-07-05 (×2): qty 5

## 2022-07-05 NOTE — Progress Notes (Signed)
Mobility Specialist Progress Note   07/05/22 0945  Mobility  Activity Ambulated with assistance in hallway;Transferred to/from [facility]  Level of Assistance Contact guard assist, steadying assist  Assistive Device Front wheel walker  Distance Ambulated (ft) 25 ft  Range of Motion/Exercises Active;All extremities  Activity Response Tolerated well   Pre Mobility:  HR 71, SpO2 98% RA During Mobility: HR 76 Post Mobility: HR 73, SpO2 99% RA  Patient received in supine and very reluctant to participate. With max encouragement from daughter and education from Probation officer, patient eventually agreed. Presented A&O x4 but is limited by cognition as patient is forgetful, slow processing and has poor awareness of deficits and insight of benefits of mobility. Is supervision for bed mobility requiring multimodal cues for to use bed rail and sequencing LE to EOB from supine to sit. Was min guard to stand and pivot to [facility], then ambulated short distance in room with close min guard for safety. Required constant cueing to re-orient to tasks and maintain proximal distance to RW. Returned to bed without complaint or incident. Was left in supine with all needs met, call bell in reach.   Tara Potts, BS EXP Mobility Specialist Please contact via SecureChat or Rehab office at [phone]

## 2022-07-05 NOTE — Progress Notes (Signed)
[facility] Progress Note   Assessment/ Plan:   Acute on CKD3b w/ baseline creatinine in the 1.5-1.8 range but that was in Jan-Mar 2023 with increase to 4.71 06/06/22 and then 12 at f/u with primary care provider office. No e/o obstruction on ultrasound but she has had myeloma diagnosed in 2021 which has never been treated. Already with a  M-spike UPEP of 39% 53m and a positive elevation in free Kappa light chains in 06/22/2020. Suspicious for myeloma kidney with possibly superimposed amyloid light chain deposition as well. - FLC, SPEP have been sent  - heme/ onc consulted, appreciate assistance - I have also consulted pall care to help with decision making- this is probably the linchpin of what we need going forward- greatly appreciate their help - no hard indication for RRT at present- will continue to follow - if she ultimately decides not to pursue rx for MM dialysis would be unlikely to contribute to QOL- talked specifically today that dialysis could potentially prolong suffering which is not what I am hearing is their [place]       Pyuria with no fevers, dysuria - send culture Anemia - worse than her baseline and have to wonder if the myeloma has continued to progress. Hypertension - may restart outpt regimen but hold the Losartan given renal failure.  Subjective:    Seen in room.  Decided on treatment last night but now this AM dtr says no chemo.  She wants to try an alternative therapy.  She says she doesn't want her mom to suffer.  She is inquiring about dialsysi.  Talk today about how dialysis is unlikely to improve QOL and will not fix issue of myeloma.  Discussed fatigue, cardiac risks assoc with dialysis too.     Objective:   BP (!) 178/54 (BP Location: Right Arm)   Pulse 69   Temp 97.8 F (36.6 C) (Oral)   Resp 15   Ht _0  (1.702 m)   Wt 75.3 kg   LMP 07/16/2002   SpO2 97%   BMI 26.00 kg/m   Intake/Output Summary (Last 24 hours) at 07/05/2022 [id number]  data filed at 07/05/2022 0600 Gross per 24 hour  Intake 2087.94 ml  Output 425 ml  Net 1662.94 ml   Weight change:   Physical Exam: GCBS:WHQPRin bed, NAD CVS:RRR Resp: clear Abd: soft Ext: 1+ LE edema  Imaging: DG Bone Survey Met  Result Date: 07/04/2022 CLINICAL DATA:  Multiple myeloma. EXAM: METASTATIC BONE SURVEY COMPARISON:  CT head and cervical spine 06/06/2022. Bone survey 05/13/2018. FINDINGS: There are no suspicious lucent or blastic osseous lesions. There are healing left seventh rib fracture. Sternotomy wires are present. Degenerative changes affect the lumbar spine. Right hip arthroplasty is again seen. IMPRESSION: 1. No suspicious lucent or blastic osseous lesions. 2. Healing left seventh rib fracture. Electronically Signed   By: ARonney AstersM.D.   On: 07/04/2022 18:25    Labs: BMET Recent Labs  Lab 07/02/22 1852 07/03/22 0339 07/04/22 0025 07/05/22 0020  NA 133* 132* 135 136  K 4.9 4.3 4.9 4.4  CL 98 99 103 107  CO2 19* 18* 17* 16*  GLUCOSE 99 109* 101* 88  BUN 89* 89* 83* 80*  CREATININE 12.47* 12.41* 11.72* 11.89*  CALCIUM 9.2 8.6* 8.7* 8.7*  PHOS  --  6.1*  --   --    CBC Recent Labs  Lab 07/02/22 1852 07/03/22 0339 07/04/22 0025 07/04/22 0740 07/05/22 0020  WBC 6.7 6.1 6.4  --  6.3  NEUTROABS 3.7 3.4  --   --   --   HGB 7.7* 7.3* 6.6* 7.8* 7.8*  HCT 24.2* 22.0* 20.5* 24.4* 23.8*  MCV 84.0 81.5 83.0  --  82.9  PLT 254 216 225  --  203    Medications:     sodium chloride   Intravenous Once   apixaban  2.5 mg Oral BID   hydrALAZINE  25 mg Oral BID   metoprolol tartrate  25 mg Oral BID    Madelon Lips MD 07/05/2022, 8:14 AM

## 2022-07-05 NOTE — Progress Notes (Signed)
Urine culture sent to lab and lab was notified that it was sent. This is the second culture sent to lab. Will continue to monitor.

## 2022-07-05 NOTE — Progress Notes (Signed)
Consultation Progress Note   Patient: Tara Potts WGN:[id number] DOB: 11-Aug-1933 DOA: 07/02/2022 DOS: the patient was seen and examined on 07/05/2022 Primary service: Bonnell Public, MD  Brief hospital course: 41 F who has been admitted with acute renal failure after outpatient labs reflected interval worsening in serum creatinine, which appears to be progressively increasing over the course of the last 3 to 4 weeks.  No evidence of respiratory distress, maintaining O2 sats in the high 90s on room air. No sob. No orthopnea.  Continues to produce urine.  Is alert and oriented .potassium 4.9.  Mild anion gap metabolic acidosis.  Chest x-ray clear.  Prior to worsening of her Cr, she was started on Lasix as an outpatient for some lower extremity edema.  She received Gentle IV hydration. Hb dropped to 6.6, she has been transfused PRBCs. Nephrology saw patient yesterday, there is a high suspicion for MM. Hematology consult placed.  07/05/2022: Patient seen alongside patient's daughter.  Patient and patient's daughter have indicated they do not want chemotherapy or dialysis.  Palliative care team has been consulted.  No new complaints today.  Patient seems comfortable.  Likely discharge with hospice follow-up.   Assessment and Plan: Principal Problem:   Acute renal failure superimposed on stage 3b chronic kidney disease (HCC) Acute blood loss anemia Active Problems:   Essential hypertension   Acute hyponatremia   Chronic anemia   Pyuria, sterile   Chronic diastolic CHF (congestive heart failure) (HCC)   Paroxysmal atrial fibrillation (HCC)  Acute renal failure superimposed on CKD 3B:  -Progressive increase in creatinine over the last few weeks relative to the last time she was noted to be at her baseline, with creatinine of 1.6 in March 2023. Scr at admission was 12  -Prior diagnosis of MM in 2021 with no treatment since then. -Suspicious for myeloma kidney with possibly superimposed  amyloid light chain deposition per nephrology. -Heme/Onc consulted -No  absolute need for dialysis but possibly  Therapeutic plasma exchange. -Holding nephrotoxins, dose meds renally,  07/05/2022: AKI is likely secondary to myeloma kidney.  Patient and patient's daughter have declined hemodialysis.  Nephrology input is appreciated.  Acute blood loss anemia -S/P transfusion of PRBCs, Hb improved to 7.8 -Monitor trend -Check TIBC, iron, ferritin 07/05/2022: Anemia of chronic inflammation.  Patient has opted for hospice input.    Paroxysmal atrial fibrillation ([place]): -Currently rate controlled, continue AC with Eliquis   Chronic diastolic CHF (congestive heart failure) ([place]): -Compensated.   Multiple Myeloma: -As per oncology team: "This is a very pleasant 88 years old white female with plasma cell neoplasm, multiple myeloma.  The patient has been in observation for several years since she declined any treatment. She was supposed to come back in May 2020 but she was lost to follow-up  She finally presented in December 2021 and repeat myeloma panel showed further increase in her protein study. The patient has been reluctant about any treatment. I recommended for her to have repeat myeloma panel in the next few days. I also discussed with the patient and her daughter her current condition and give them information about multiple myeloma and possible treatment options. I strongly recommend for the patient to consider treatment with subcutaneous Velcade and Decadron to control her disease but the patient and her daughter are in denial about her condition and they thought that they had been seen for anemia. I explained to the patient and her daughter that her anemia and the renal insufficiency are likely related to  her multiple myeloma and hopefully treatment of her condition may improve some of these issues. I provided them with information and handout about multiple myeloma, Velcade and  Decadron. If the patient decides to proceed with the treatment, her daughter will call next week otherwise I will see her back for follow-up visit in 3 months with repeat myeloma panel for close monitoring of her condition".   -Patient and patient's daughter have declined treatment for multiple myeloma.  Subjective:  -No new complaints today. -Patient is comfortable.      Physical Exam: Vitals:   07/05/22 0345 07/05/22 0759 07/05/22 0922 07/05/22 1108  BP: (!) 168/55 (!) 178/54  (!) 149/53  Pulse: 65 69 71 60  Resp: _0 Temp: 98.1 F (36.7 C) 97.8 F (36.6 C)  98.1 F (36.7 C)  TempSrc: Oral Oral  Oral  SpO2: 96% 97%  99%  Weight:      Height: 5' 7" (1.702 m)     General appearance: Awake and alert.  Not in any distress. HEENT: Patient is pale.  No jaundice. Neck: Supple. Lungs: Clear to auscultation. CVS: S1-S2, irregularly irregular.   Neuro: Awake and alert.  Patient moves all extremities. Abdomen: Soft and nontender. Extremities: Bilateral lower extremity edema.      Data Reviewed:   Family Communication: Daughter at bedside  Time spent: 35 minutes.  Author: Bonnell Public, MD 07/05/2022 2:19 PM  For on call review [email or web address].

## 2022-07-05 NOTE — Progress Notes (Signed)
88yof admitted s/p fall with AKI Cr peak 12> 11  Patient with Hx afib currently in SR previously anticoagulated with apixaban 2.61m bid - dose correct for age and cr - restarted on admit. PTA med list says patient not taking apixaban - discussed reason with daughter - for no other reason than was too expensive and patient assistance had expired.  - re-educated patient  and family on anticoagulation Patient now with medicare and copay check $45   Upon further discussion - patient with multiple myeloma and daughter tells me she does not want chemo for her mom.   [facility]  Will hold off on making chemo unless otherwise directed based on further MD and patient discussions    LBonnita NasutiPharm.D. CPP, BCPS Clinical Pharmacist [phone]/21/2023 9:18 AM

## 2022-07-05 NOTE — Progress Notes (Signed)
CHART NOTE The patient was seen yesterday and she and her daughter were interested in starting treatment with chemotherapy as well as hemodialysis and even plasmapheresis. I placed an order for chemotherapy to start tomorrow but the patient was seen earlier today by Bonnita Nasuti clinical pharmacist at [facility] to discuss the chemotherapy with her and the patient and her daughter indicated that they have no interest in proceeding with chemotherapy. I will discontinue the care plan and I will sign off her care at this point.  I think the best option for this patient is palliative care and hospice. Please call if you have any other question. Thank you

## 2022-07-06 DIAGNOSIS — Z66 Do not resuscitate: Secondary | ICD-10-CM

## 2022-07-06 DIAGNOSIS — Z7189 Other specified counseling: Secondary | ICD-10-CM | POA: Diagnosis not present

## 2022-07-06 DIAGNOSIS — C9000 Multiple myeloma not having achieved remission: Secondary | ICD-10-CM | POA: Diagnosis not present

## 2022-07-06 DIAGNOSIS — N178 Other acute kidney failure: Secondary | ICD-10-CM | POA: Diagnosis not present

## 2022-07-06 DIAGNOSIS — Z515 Encounter for palliative care: Secondary | ICD-10-CM

## 2022-07-06 LAB — PROTEIN ELECTROPHORESIS, SERUM
A/G Ratio: 0.5 — ABNORMAL LOW (ref 0.7–1.7)
Albumin ELP: 3 g/dL (ref 2.9–4.4)
Alpha-1-Globulin: 0.3 g/dL (ref 0.0–0.4)
Alpha-2-Globulin: 0.8 g/dL (ref 0.4–1.0)
Beta Globulin: 4.6 g/dL — ABNORMAL HIGH (ref 0.7–1.3)
Gamma Globulin: 0.3 g/dL — ABNORMAL LOW (ref 0.4–1.8)
Globulin, Total: 6.1 g/dL — ABNORMAL HIGH (ref 2.2–3.9)
M-Spike, %: 3.6 g/dL — ABNORMAL HIGH
Total Protein ELP: 9.1 g/dL — ABNORMAL HIGH (ref 6.0–8.5)

## 2022-07-06 LAB — URINE CULTURE

## 2022-07-06 MED ORDER — DIPHENHYDRAMINE HCL 12.5 MG/5ML PO ELIX: 6.2500 mg | ORAL_SOLUTION | Freq: Three times a day (TID) | ORAL | 0 refills | Status: DC | PRN

## 2022-07-06 MED ORDER — SENNA 8.6 MG PO TABS
2.0000 | ORAL_TABLET | Freq: Every day | ORAL | Status: DC
  Administered 2022-07-06: 17.2 mg via ORAL
  Filled 2022-07-06: qty 2

## 2022-07-06 NOTE — Consult Note (Signed)
Palliative Care Consult Note                                  Date: 07/06/2022   Patient Name: Tara Potts  DOB: [birth year]  MRN: [id number]  Age / Sex: 88 y.o., female  PCP: [name], MD Referring Physician: Bonnell Public, MD  Reason for Consultation: Establishing goals of care  HPI/Patient Profile: 88 y.o. female  with past medical history of chronic kidney disease stage IIIb, coronary artery disease s/p CABG, paroxysmal atrial fibrillation, HFpEF, and anemia.  She was diagnosed with multiple myeloma in 09/01/2017, but was not interested in treatment .  She was admitted to [facility] on 07/02/2022 with acute kidney injury after outpatient labs showed interval worsening in serum creatinine, which appears to be progressively increasing over the past 3 to 4 weeks.  Serum creatinine on admission was 12.  Acute kidney injury is likely secondary to MM.  She also has acute blood loss anemia. Patient was seen by oncology on 12/20 and initially was agreeable to treatment, but has since declined chemotherapy as well as hemodialysis. Palliative Medicine has been consulted for goals of care.   Past Medical History:  Diagnosis Date   Anemia    Arthritis    Asthma    as a child   CAD (coronary artery disease)    Carotid disease, bilateral ([place])    mild   Fibroid    Hx of CABG 2005/09/01   [facility]   Hyperlipemia    Hypertension    Lumbosacral radiculopathy 09/10/2014   Menopausal symptoms    Multiple myeloma ([place]) 05/26/2018   Peripheral neuropathy    Seizure-like activity (HCC)     Subjective:   I have reviewed medical records including progress notes, labs and imaging, and assessed the patient at bedside. She is in the process of getting up to the recliner with the mobility specialist.  I met with daughter/Tara Potts in the [facility] conference room to discuss diagnosis, prognosis, [place], EOL wishes, disposition, and options.  I introduced  Palliative Medicine as specialized medical care for people living with serious illness. It focuses on providing relief from the symptoms and stress of a serious illness.   Created space and opportunity for patient and family to express thoughts and feelings regarding current medical situation. Values and goals of care important to patient and family were attempted to be elicited.   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  She also had a very successful [employer] business and was able to travel the world.  Her husband passed away in 09/01/2006. Tara Potts is her only living child. She also has a granddaughter who lives in [place].   Functional Status: Tara Potts lives with Tara Potts.  At baseline, Tara Potts has been ambulatory and independent with ADLs.  However since Thanksgiving, she has declined (low energy and not eating very much).   Discussion: We reviewed [name]'s diagnosis of multiple myeloma as well as acute renal failure.  Per discussion oncology, Tara Potts understands that her mother has a terminal condition.  She confirms that her mother is not interested in chemotherapy or hemodialysis.  Their goal is to get home as soon as possible and to focus on quality of life for whatever time Tara Potts has left.  Tara Potts is understandably emotional and tearful during our discussion.   I made a recommendation for hospice care at home.  Discussed that it offers a holistic approach to care in the setting of end-stage illness, and is about supporting the patient where they are while allowing the natural course to occur. Discussed that the goal is comfort and dignity rather than cure/prolonging life. Discussed that the hospice team can provide personal care, support with symptom management, and help keep patient out of the hospital. Tara Potts is agreeable to hospice at home, but she has some concerns about the use of pain medication. I provided education and reassurance that pain medication is only utilized  if/when needed. Tara Potts verbalizes understanding and ultimately expresses not wanting her mother to suffer.    We discussed code status. Encouraged DNR/DNI status understanding prognosis would be poor in the event of cardiopulmonary arrest, as the cause of the arrest would be associated with terminal disease rather than a reversible acute cardio-pulmonary event. I explained that DNR/DNI  is a protective measure to keep [place] from harming the patient in their last moments of life. Patient/daughter are agreeable to DNR/DNI.  Plan of care was discussed with Dr. Marthenia Rolling, [facility] team, and patient's RN    Review of Systems  Gastrointestinal:  Positive for abdominal pain and constipation.    Objective:   Primary Diagnoses: Present on Admission:  Acute renal failure superimposed on stage 3b chronic kidney disease (HCC)  Acute hyponatremia  Pyuria, sterile  Chronic diastolic CHF (congestive heart failure) (HCC)  Chronic anemia  Essential hypertension  Paroxysmal atrial fibrillation [facility])   Physical Exam Vitals reviewed.  Constitutional:      General: She is not in acute distress.    Appearance: She is ill-appearing.     Comments: Frail  Cardiovascular:     Rate and Rhythm: Normal rate.  Pulmonary:     Effort: Pulmonary effort is normal.  Neurological:     Mental Status: She is alert.     Vital Signs:  BP (!) 188/67 (BP Location: Right Arm)   Pulse 78   Temp 97.8 F (36.6 C) (Oral)   Resp 19   Ht _0  (1.702 m)   Wt 75.3 kg   LMP 07/16/2002   SpO2 98%   BMI 26.00 kg/m   Palliative Assessment/Data: PPS 40%     Assessment & Plan:   SUMMARY OF RECOMMENDATIONS   CODE STATUS changed to DNR/DNI - gold form signed and placed on chart Patient and daughter confirm they are not interested in chemotherapy or hemodialysis Plan for home with hospice as soon as possible - daughter stated preference for [facility]  Primary Decision Maker: Patient with support from her  daughter  Symptom Management:  Senna 2 tables daily for constipation  Prognosis:  < 6 months   Thank you for allowing [place] to participate in the care of [place]   Signed by: Elie Confer, NP Palliative Medicine Team  Team Phone # [phone]  For individual providers, please see AMION

## 2022-07-06 NOTE — Progress Notes (Signed)
Mobility Specialist Progress Note   07/06/22 0915  Mobility  Activity Ambulated with assistance in room;Transferred to/from [facility];Transferred from bed to chair  Level of Assistance Contact guard assist, steadying assist  Assistive Device Front wheel walker  Distance Ambulated (ft) 30 ft  Range of Motion/Exercises Active;All extremities  Activity Response Tolerated well   Patient received in supine and agreeable to participate with encouragement from daughter. Presented A&O to self only, needing multimodal cues and reorientation to tasks throughout. Required minimal HHA to EOB from supine to sit. Stood with min A and transferred to [facility] at min guard. Was able to extend distance compared to last session, ambulated in room with slow steady gait. Tolerated without complaint or incident. Was left in recliner with all needs met, call bell in reach.   [place] Maebel Marasco, BS EXP Mobility Specialist Please contact via SecureChat or Rehab office at [phone]

## 2022-07-06 NOTE — Progress Notes (Signed)
Patient provided with verbal discharge instructions. Daughter at bedside during d/c.  Daughter states does not have Eliqus at home because of cost. Was told by pharmacy last time she picked up medication that the next refill cost would be $450. Contacted on call social worker for clarification, was advised pt cost should not be that much. SW advised daughter to go back to pharmacy and have them call the hospital if required cost is still $450. Discount Eliquis card given to daughter as well.  Paper copy of discharge provided to patients daughter. RN answered all questions. VSS at discharge. IV removed. Patient belongings sent with patient. Patient dc'd via PTAR.

## 2022-07-06 NOTE — Care Management Important Message (Signed)
Important Message  Patient Details  Name: Tara Potts MRN: [id number] Date of Birth: Apr 03, 1934   Medicare Important Message Given:  Yes     [place] Vanaken 07/06/2022, 1:28 PM

## 2022-07-06 NOTE — TOC Initial Note (Addendum)
Transition of Care [facility]) - Initial/Assessment Note    Patient Details  Name: Tara Potts MRN: [id number] Date of Birth: August 11, 1933  Transition of Care [facility]) CM/SW Contact:    Marilu Favre, RN Phone Number: 07/06/2022, 11:11 AM  Clinical Narrative:                 Received secure chat from palliative team, patient and daughter would like to go home with hospice, today if possible.   Spoke to patient and daughter [name] at bedside. Confirmed plan. Offered choice. They would prefer [facility]. Referral made to [facility] with [place] who is aware patient would like to discharge today.   Patient has wheelchair, bedside commode and walker at home.   Patient will need ambulance transport home.   NCM will complete PTAR paperwork and place on chart and await [facility] to [place] with [facility]. Patient has been accepted . She does not need any DME . Patient can discharge from their standpoint. Secure chatted team . Will call PTAR when notified   1450 Patient ready for discharge. Daughter at bedside. PTAR called   Expected Discharge Plan: [place]     Patient Goals and CMS Choice Patient states their goals for this hospitalization and ongoing recovery are:: to return to home CMS Medicare.gov Compare Post Acute Care list provided to:: Patient Choice offered to / list presented to : Patient, Adult Children      Expected Discharge Plan and Services   Discharge Planning Services: CM Consult Post Acute Care Choice: Hospice Living arrangements for the past 2 months: Single Family Home                 DME Arranged: N/A DME Agency: NA         HH Agency: [place] Date [place]: 07/06/22 Time [place]: 48 Representative spoke with at [place]: [place] Arrangements/Services Living arrangements for the past 2 months: [place] Lives with::  Adult Children Patient language and need for interpreter reviewed:: Yes Do you feel safe going back to the place where you live?: Yes      Need for Family Participation in Patient Care: Yes (Comment) Care giver support system in place?: Yes (comment) Current home services: DME Criminal Activity/Legal Involvement Pertinent to Current Situation/Hospitalization: No - Comment as needed  Activities of Daily Living Home Assistive Devices/Equipment: Bedside commode/3-in-1, Cane (specify quad or straight) ADL Screening (condition at time of admission) Patient's cognitive ability adequate to safely complete daily activities?: No Is the patient deaf or have difficulty hearing?: No Does the patient have difficulty seeing, even when wearing glasses/contacts?: No Does the patient have difficulty concentrating, remembering, or making decisions?: Yes Patient able to express need for assistance with ADLs?: Yes Does the patient have difficulty dressing or bathing?: Yes Independently performs ADLs?: No Communication: Needs assistance Is this a change from baseline?: Pre-admission baseline Dressing (OT): Needs assistance Is this a change from baseline?: Pre-admission baseline Grooming: Needs assistance Is this a change from baseline?: Pre-admission baseline Feeding: Independent Bathing: Needs assistance Is this a change from baseline?: Pre-admission baseline Toileting: Needs assistance Is this a change from baseline?: Pre-admission baseline In/Out Bed: Needs assistance Is this a change from baseline?: Pre-admission baseline Walks in Home: Independent with device (comment) Does the patient have difficulty walking or climbing stairs?: Yes Weakness of Legs: Both Weakness of Arms/Hands: Both  Permission Sought/Granted   Permission granted to share information with : Yes, Verbal Permission Granted  Share Information with NAME: daughter [name]  Permission granted to share info w AGENCY: [facility]        Emotional Assessment Appearance:: Appears stated age Attitude/Demeanor/Rapport: Engaged Affect (typically observed): Accepting  Psych Involvement: No (comment)  Admission diagnosis:  Acute renal failure (ARF) ([place]) [N17.9] Acute renal failure, unspecified acute renal failure type ([place]) [N17.9] Patient Active Problem List   Diagnosis Date Noted   Acute renal failure (ARF) ([place]) 07/03/2022   Pyuria, sterile 07/03/2022   Chronic diastolic CHF (congestive heart failure) ([place]) 07/03/2022   Paroxysmal atrial fibrillation ([place]) 07/03/2022   Sciatica of right side 06/20/2022   Pure hypercholesterolemia 06/20/2022   Physical deconditioning 06/20/2022   Peripheral neuropathic pain 06/20/2022   Seborrheic keratoses 06/20/2022   Venous (peripheral) insufficiency 06/20/2022   Vitamin D deficiency 06/20/2022   NSTEMI (non-ST elevated myocardial infarction) ([place]) 07/17/2021   Hypertensive emergency 07/16/2021   Chronic anemia 07/16/2021   Acute lower UTI 06/21/2020   Dehydration 06/21/2020   Acute renal failure superimposed on stage 3b chronic kidney disease ([place]) 06/21/2020   Acute hyponatremia 06/21/2020   Gait abnormality 04/14/2020   Peripheral neuropathy 04/14/2020   CRI (chronic renal insufficiency), stage 3 (moderate) ([place]) 11/20/2019   Bilateral lower extremity edema 10/23/2019   Multiple myeloma ([place]) 05/26/2018   Goals of care, counseling/discussion 05/26/2018   Weakness of right leg 05/22/2018   Class 1 obesity 01/29/2018   S/P right THA, AA 01/28/2018   S/P hip replacement 01/28/2018   Carotid artery disease ([place]) 04/12/2017   Passed out 10/05/2015   Lumbosacral radiculopathy 09/10/2014   Cervical stenosis of spinal canal 09/10/2014   Abnormality of gait 07/27/2014   Paresthesia 07/27/2014   Bilateral leg cramps 02/21/2014   Cramps of lower extremity 02/21/2014   Hx of CABG 01/13/2013   Peripheral vascular disease:  Moderate right and mild left ICA  stenosis. 01/13/2013   Essential hypertension 01/13/2013   Dyslipidemia, goal LDL below 70    Menopause 02/12/2011   Leiomyoma of body of uterus 02/12/2011   Post-menopausal atrophic vaginitis 02/12/2011   Urinary frequency 02/12/2011   UNSPECIFIED PERIPHERAL VASCULAR DISEASE 07/11/2010   PCP:  [name], MD Pharmacy:   [id number] [place]Phone: [phone]: [phone]    Social Determinants of Health (SDOH) Social History: [place] No Food Insecurity (07/04/2022)  Housing: Low Risk  (07/04/2022)  Transportation Needs: No Transportation Needs (07/04/2022)  Utilities: Not At Risk (07/04/2022)  Tobacco Use: Medium Risk (07/03/2022)   SDOH Interventions: Housing Interventions: Intervention Not Indicated   Readmission Risk Interventions    07/19/2021   11:40 AM  Readmission Risk Prevention Plan  Transportation Screening Complete  PCP or Specialist Appt within 5-7 Days Complete  Home Care Screening Complete

## 2022-07-06 NOTE — Progress Notes (Signed)
Brief Note: Palliative care met with pt and dtr They have elected for hospice I greatly appreciate their help and expertise. I'll sign off now, please call with any questions or concerns.  Madelon Lips MD [employer] [facility] [phone]

## 2022-07-06 NOTE — Discharge Summary (Signed)
Physician Discharge Summary  Patient ID: Tara Potts MRN: [id number] DOB/AGE: 10-04-1933 88 y.o.  Admit date: 07/02/2022 Discharge date: 07/06/2022  Admission Diagnoses:  Discharge Diagnoses:  Principal Problem:   Acute renal failure superimposed on stage 3b chronic kidney disease ([place]) Active Problems:   Essential hypertension   Acute hyponatremia   Chronic anemia   Pyuria, sterile   Chronic diastolic CHF (congestive heart failure) (H[facility])   Paroxysmal atrial fibrillation ([place])   Discharged Condition: stable  Hospital Course: Patient is an 88 year old female with past medical history significant for multiple myeloma.  Patient was admitted with acute renal failure after outpatient labs reflected interval worsening in serum creatinine, which appears to be progressively increasing over the course of the last 3 to 4 weeks preceding admission.  No evidence of respiratory distress, maintaining O2 sats in the high 90s on room air. No sob. No orthopnea.  Continues to produce urine, alert and oriented, potassium 4.9, mild anion gap metabolic acidosis.  Chest x-ray was clear.  Prior to worsening of her Cr, patient was started on Lasix as an outpatient for some lower extremity edema.  Patient was admitted for further assessment and management.  Patient received gentle IV hydration.  Hemoglobin dropped to 6.6 grams per deciliter.  Patient was transfused with packed red blood cells.  Nephrology and oncology team were consulted to assist with patient's management.  Worsening renal function was thought to be secondary to multiple myeloma.  The oncology team discussed treatment option, however, the patient and the family have declined treatment for multiple myeloma or hemodialysis.  Patient will be discharged back home with hospice follow-up.  Acute renal failure superimposed on CKD 3B:  -Progressive increase in creatinine over the last few weeks. -Patient's baseline serum creatinine was 1.6. -Serum  creatinine on admission was 12.   -Prior diagnosis of MM in 2021 with no treatment since then. -Suspicious for myeloma kidney with possibly superimposed amyloid light chain deposition per nephrology. -Heme/Onc consulted -Patient and patient's daughter have declined hemodialysis.     Acute blood loss anemia: -S/P transfusion of PRBCs, Hb improved to 7.8 -Monitor trend -Checked TIBC, iron, ferritin -Patient has opted for hospice input.      Paroxysmal atrial fibrillation ([place]): -Currently rate controlled, continue AC with Eliquis    Chronic diastolic CHF (congestive heart failure) ([place]): -Compensated.    Multiple Myeloma: -As per oncology team's documentation: "This is a very pleasant 88 years old white female with plasma cell neoplasm, multiple myeloma.  The patient has been in observation for several years since she declined any treatment. She was supposed to come back in May 2020 but she was lost to follow-up  She finally presented in December 2021 and repeat myeloma panel showed further increase in her protein study. The patient has been reluctant about any treatment. I recommended for her to have repeat myeloma panel in the next few days. I also discussed with the patient and her daughter her current condition and give them information about multiple myeloma and possible treatment options. I strongly recommend for the patient to consider treatment with subcutaneous Velcade and Decadron to control her disease but the patient and her daughter are in denial about her condition and they thought that they had been seen for anemia. I explained to the patient and her daughter that her anemia and the renal insufficiency are likely related to her multiple myeloma and hopefully treatment of her condition may improve some of these issues. I provided them with information and  handout about multiple myeloma, Velcade and Decadron. If the patient decides to proceed with the treatment, her daughter  will call next week otherwise I will see her back for follow-up visit in 3 months with repeat myeloma panel for close monitoring of her condition".    -Patient and patient's daughter have declined treatment for multiple myeloma.    Consults: nephrology and hematology/oncology    Discharge Exam: Blood pressure (!) 156/55, pulse (!) 56, temperature 98.2 F (36.8 C), temperature source Oral, resp. rate 18, height '5\' 7"'$  (1.702 m), weight 75.3 kg, last menstrual period 07/16/2002, SpO2 99 %.   Disposition: Discharge disposition: 01-Home or Self Care.  Home with hospice.       Discharge Instructions     Diet - low sodium heart healthy   Complete by: As directed    Increase activity slowly   Complete by: As directed       Allergies as of 07/06/2022       Reactions   Codeine Nausea And Vomiting   confusion   Cymbalta [duloxetine Hcl] Nausea Only   Ezetimibe    Other reaction(s): cramping   Gabapentin    Other reaction(s): felt crazy        Medication List     STOP taking these medications    CALCIUM MAGNESIUM PO   ezetimibe 10 MG tablet Commonly known as: ZETIA   hydrALAZINE 25 MG tablet Commonly known as: APRESOLINE   IRON PO   losartan 100 MG tablet Commonly known as: COZAAR   potassium chloride 10 MEQ tablet Commonly known as: KLOR-CON   VITAMIN C PO   VITAMIN D PO       TAKE these medications    apixaban 2.5 MG Tabs tablet Commonly known as: ELIQUIS Take 1 tablet (2.5 mg total) by mouth 2 (two) times daily. What changed: Another medication with the same name was removed. Continue taking this medication, and follow the directions you see here.   diphenhydrAMINE 12.5 MG/5ML elixir Commonly known as: BENADRYL Take 2.5 mLs (6.25 mg total) by mouth every 8 (eight) hours as needed for itching.   furosemide 40 MG tablet Commonly known as: LASIX Take 40 mg by mouth every morning.   isosorbide mononitrate 60 MG 24 hr tablet Commonly known as:  IMDUR Take 1 tablet (60 mg total) by mouth daily.   metoprolol tartrate 25 MG tablet Commonly known as: LOPRESSOR Take 1 tablet (25 mg total) by mouth 2 (two) times daily.        Follow-up Information     [facility] Follow up.   Contact information: [address] [place] [place] [address] [phone]                Time spent: 36 Minutes.  SignedBonnell Public 07/06/2022, 2:18 PM

## 2022-07-25 ENCOUNTER — Ambulatory Visit: Payer: Medicare HMO | Attending: Cardiovascular Disease | Admitting: Cardiovascular Disease

## 2022-08-08 ENCOUNTER — Encounter: Payer: Self-pay | Admitting: *Deleted

## 2022-08-16 DEATH — deceased

## 2022-10-08 ENCOUNTER — Ambulatory Visit: Payer: Medicare HMO | Admitting: Podiatrist
